# Patient Record
Sex: Male | Born: 1940 | Race: White | Hispanic: No | Marital: Married | Smoking: Former smoker
Health system: Southern US, Community
[De-identification: ages and names within clinical notes are randomized; demographics above are authoritative.]

## PROBLEM LIST (undated history)

## (undated) DIAGNOSIS — M069 Rheumatoid arthritis, unspecified: Secondary | ICD-10-CM

## (undated) DIAGNOSIS — J449 Chronic obstructive pulmonary disease, unspecified: Secondary | ICD-10-CM

## (undated) DIAGNOSIS — G5603 Carpal tunnel syndrome, bilateral upper limbs: Secondary | ICD-10-CM

## (undated) DIAGNOSIS — J45909 Unspecified asthma, uncomplicated: Secondary | ICD-10-CM

## (undated) DIAGNOSIS — E785 Hyperlipidemia, unspecified: Secondary | ICD-10-CM

## (undated) DIAGNOSIS — T8859XA Other complications of anesthesia, initial encounter: Secondary | ICD-10-CM

## (undated) DIAGNOSIS — N400 Enlarged prostate without lower urinary tract symptoms: Secondary | ICD-10-CM

## (undated) DIAGNOSIS — K219 Gastro-esophageal reflux disease without esophagitis: Secondary | ICD-10-CM

## (undated) DIAGNOSIS — I10 Essential (primary) hypertension: Secondary | ICD-10-CM

## (undated) HISTORY — PX: VASECTOMY: SHX75

## (undated) HISTORY — DX: Hyperlipidemia, unspecified: E78.5

## (undated) HISTORY — DX: Rheumatoid arthritis, unspecified: M06.9

## (undated) HISTORY — DX: Essential (primary) hypertension: I10

## (undated) HISTORY — DX: Benign prostatic hyperplasia without lower urinary tract symptoms: N40.0

## (undated) HISTORY — PX: HERNIA REPAIR: SHX51

## (undated) HISTORY — PX: UPPER GASTROINTESTINAL ENDOSCOPY: SHX188

## (undated) HISTORY — DX: Gastro-esophageal reflux disease without esophagitis: K21.9

## (undated) HISTORY — DX: Unspecified asthma, uncomplicated: J45.909

## (undated) HISTORY — DX: Carpal tunnel syndrome, bilateral upper limbs: G56.03

---

## 1998-05-09 ENCOUNTER — Encounter: Admission: RE | Admit: 1998-05-09 | Discharge: 1998-05-09 | Payer: Self-pay | Admitting: *Deleted

## 1998-10-20 ENCOUNTER — Ambulatory Visit (HOSPITAL_COMMUNITY): Admission: RE | Admit: 1998-10-20 | Discharge: 1998-10-20 | Payer: Self-pay | Admitting: Nurse Practitioner

## 2002-04-05 ENCOUNTER — Encounter: Payer: Self-pay | Admitting: Internal Medicine

## 2002-04-05 DIAGNOSIS — K648 Other hemorrhoids: Secondary | ICD-10-CM | POA: Insufficient documentation

## 2002-04-05 DIAGNOSIS — D126 Benign neoplasm of colon, unspecified: Secondary | ICD-10-CM

## 2004-07-03 ENCOUNTER — Ambulatory Visit: Payer: Self-pay | Admitting: Internal Medicine

## 2004-07-19 ENCOUNTER — Ambulatory Visit: Payer: Self-pay | Admitting: Internal Medicine

## 2004-07-27 ENCOUNTER — Ambulatory Visit: Payer: Self-pay | Admitting: Internal Medicine

## 2004-08-01 ENCOUNTER — Ambulatory Visit: Payer: Self-pay | Admitting: Internal Medicine

## 2004-08-01 ENCOUNTER — Encounter: Admission: RE | Admit: 2004-08-01 | Discharge: 2004-08-01 | Payer: Self-pay | Admitting: Internal Medicine

## 2004-08-07 ENCOUNTER — Ambulatory Visit: Payer: Self-pay | Admitting: Internal Medicine

## 2005-03-15 ENCOUNTER — Ambulatory Visit: Payer: Self-pay | Admitting: Family Medicine

## 2005-04-04 ENCOUNTER — Ambulatory Visit: Payer: Self-pay | Admitting: Internal Medicine

## 2005-05-31 ENCOUNTER — Ambulatory Visit: Payer: Self-pay | Admitting: Internal Medicine

## 2005-09-13 ENCOUNTER — Ambulatory Visit: Payer: Self-pay | Admitting: Internal Medicine

## 2006-10-01 ENCOUNTER — Ambulatory Visit: Payer: Self-pay | Admitting: Internal Medicine

## 2006-10-30 ENCOUNTER — Ambulatory Visit: Payer: Self-pay | Admitting: Internal Medicine

## 2006-10-30 LAB — CONVERTED CEMR LAB
ALT: 15 units/L (ref 0–40)
AST: 21 units/L (ref 0–37)
BUN: 11 mg/dL (ref 6–23)
Potassium: 3.7 meq/L (ref 3.5–5.1)
Total CHOL/HDL Ratio: 6.2
Triglycerides: 150 mg/dL — ABNORMAL HIGH (ref 0–149)

## 2007-01-02 ENCOUNTER — Telehealth (INDEPENDENT_AMBULATORY_CARE_PROVIDER_SITE_OTHER): Payer: Self-pay | Admitting: *Deleted

## 2007-01-13 ENCOUNTER — Ambulatory Visit: Payer: Self-pay | Admitting: Internal Medicine

## 2007-01-13 LAB — CONVERTED CEMR LAB
HDL goal, serum: 40 mg/dL
LDL Goal: 130 mg/dL

## 2007-01-30 ENCOUNTER — Ambulatory Visit: Payer: Self-pay | Admitting: Internal Medicine

## 2007-03-20 ENCOUNTER — Ambulatory Visit: Payer: Self-pay | Admitting: Internal Medicine

## 2007-03-21 LAB — CONVERTED CEMR LAB
Amylase: 59 units/L (ref 0–105)
Basophils Absolute: 0 10*3/uL (ref 0.0–0.1)
Basophils Relative: 0 % (ref 0–1)
Eosinophils Absolute: 0.2 10*3/uL (ref 0.0–0.7)
Eosinophils Relative: 4 % (ref 0–5)
HCT: 41.4 % (ref 39.0–52.0)
Hemoglobin: 13.7 g/dL (ref 13.0–17.0)
Lipase: <10 units/L (ref 0–75)
Lymphocytes Relative: 20 % (ref 12–46)
Lymphs Abs: 1.1 10*3/uL (ref 0.7–3.3)
MCHC: 33.1 g/dL (ref 30.0–36.0)
MCV: 84.3 fL (ref 78.0–100.0)
Monocytes Absolute: 0.7 10*3/uL (ref 0.2–0.7)
Monocytes Relative: 13 % — ABNORMAL HIGH (ref 3–11)
Neutro Abs: 3.6 10*3/uL (ref 1.7–7.7)
Neutrophils Relative %: 64 % (ref 43–77)
Platelets: 281 10*3/uL (ref 150–400)
RBC: 4.91 M/uL (ref 4.22–5.81)
RDW: 13.9 % (ref 11.5–14.0)
WBC: 5.6 10*3/uL (ref 4.0–10.5)

## 2007-03-23 ENCOUNTER — Encounter (INDEPENDENT_AMBULATORY_CARE_PROVIDER_SITE_OTHER): Payer: Self-pay | Admitting: *Deleted

## 2007-03-25 ENCOUNTER — Encounter: Payer: Self-pay | Admitting: Internal Medicine

## 2007-03-25 ENCOUNTER — Ambulatory Visit: Payer: Self-pay | Admitting: Internal Medicine

## 2007-03-25 DIAGNOSIS — K222 Esophageal obstruction: Secondary | ICD-10-CM | POA: Insufficient documentation

## 2007-03-30 ENCOUNTER — Encounter: Payer: Self-pay | Admitting: Internal Medicine

## 2007-04-22 ENCOUNTER — Telehealth (INDEPENDENT_AMBULATORY_CARE_PROVIDER_SITE_OTHER): Payer: Self-pay | Admitting: *Deleted

## 2007-06-17 ENCOUNTER — Ambulatory Visit: Payer: Self-pay | Admitting: Internal Medicine

## 2007-06-24 ENCOUNTER — Ambulatory Visit: Payer: Self-pay | Admitting: Internal Medicine

## 2007-06-24 ENCOUNTER — Encounter: Payer: Self-pay | Admitting: Internal Medicine

## 2007-07-07 DIAGNOSIS — Z87898 Personal history of other specified conditions: Secondary | ICD-10-CM

## 2007-07-07 DIAGNOSIS — I10 Essential (primary) hypertension: Secondary | ICD-10-CM | POA: Insufficient documentation

## 2007-07-07 DIAGNOSIS — K219 Gastro-esophageal reflux disease without esophagitis: Secondary | ICD-10-CM | POA: Insufficient documentation

## 2007-07-09 HISTORY — PX: COLONOSCOPY W/ POLYPECTOMY: SHX1380

## 2008-01-21 ENCOUNTER — Telehealth (INDEPENDENT_AMBULATORY_CARE_PROVIDER_SITE_OTHER): Payer: Self-pay | Admitting: *Deleted

## 2008-01-27 ENCOUNTER — Telehealth (INDEPENDENT_AMBULATORY_CARE_PROVIDER_SITE_OTHER): Payer: Self-pay | Admitting: *Deleted

## 2008-01-29 ENCOUNTER — Telehealth (INDEPENDENT_AMBULATORY_CARE_PROVIDER_SITE_OTHER): Payer: Self-pay | Admitting: *Deleted

## 2008-03-17 ENCOUNTER — Ambulatory Visit: Payer: Self-pay | Admitting: Internal Medicine

## 2008-03-17 DIAGNOSIS — Z860100 Personal history of colon polyps, unspecified: Secondary | ICD-10-CM | POA: Insufficient documentation

## 2008-03-17 DIAGNOSIS — E785 Hyperlipidemia, unspecified: Secondary | ICD-10-CM | POA: Insufficient documentation

## 2008-03-17 DIAGNOSIS — N400 Enlarged prostate without lower urinary tract symptoms: Secondary | ICD-10-CM

## 2008-03-17 DIAGNOSIS — J301 Allergic rhinitis due to pollen: Secondary | ICD-10-CM

## 2008-03-21 ENCOUNTER — Ambulatory Visit: Payer: Self-pay | Admitting: Internal Medicine

## 2008-03-23 ENCOUNTER — Encounter (INDEPENDENT_AMBULATORY_CARE_PROVIDER_SITE_OTHER): Payer: Self-pay | Admitting: *Deleted

## 2008-03-23 LAB — CONVERTED CEMR LAB
Albumin: 4.1 g/dL (ref 3.5–5.2)
Alkaline Phosphatase: 58 units/L (ref 39–117)
BUN: 9 mg/dL (ref 6–23)
Basophils Absolute: 0 10*3/uL (ref 0.0–0.1)
Basophils Relative: 0.6 % (ref 0.0–3.0)
Eosinophils Absolute: 0.2 10*3/uL (ref 0.0–0.7)
Eosinophils Relative: 3.7 % (ref 0.0–5.0)
HCT: 36 % — ABNORMAL LOW (ref 39.0–52.0)
Hemoglobin: 12.2 g/dL — ABNORMAL LOW (ref 13.0–17.0)
Hgb A1c MFr Bld: 6 % (ref 4.6–6.0)
MCHC: 34 g/dL (ref 30.0–36.0)
MCV: 79.4 fL (ref 78.0–100.0)
Neutro Abs: 3.6 10*3/uL (ref 1.4–7.7)
Neutrophils Relative %: 61.1 % (ref 43.0–77.0)
RBC: 4.53 M/uL (ref 4.22–5.81)
Total Protein: 6.8 g/dL (ref 6.0–8.3)
WBC: 5.7 10*3/uL (ref 4.5–10.5)

## 2008-05-24 ENCOUNTER — Ambulatory Visit: Payer: Self-pay | Admitting: Internal Medicine

## 2008-05-31 ENCOUNTER — Telehealth (INDEPENDENT_AMBULATORY_CARE_PROVIDER_SITE_OTHER): Payer: Self-pay | Admitting: *Deleted

## 2008-06-16 ENCOUNTER — Ambulatory Visit: Payer: Self-pay | Admitting: Internal Medicine

## 2008-06-17 ENCOUNTER — Telehealth: Payer: Self-pay | Admitting: Internal Medicine

## 2008-06-20 ENCOUNTER — Encounter: Payer: Self-pay | Admitting: Internal Medicine

## 2008-06-23 ENCOUNTER — Ambulatory Visit (HOSPITAL_COMMUNITY): Admission: RE | Admit: 2008-06-23 | Discharge: 2008-06-23 | Payer: Self-pay | Admitting: Orthopedic Surgery

## 2008-07-08 HISTORY — PX: LUMBAR LAMINECTOMY: SHX95

## 2008-07-12 ENCOUNTER — Encounter (INDEPENDENT_AMBULATORY_CARE_PROVIDER_SITE_OTHER): Payer: Self-pay | Admitting: Orthopedic Surgery

## 2008-07-12 ENCOUNTER — Ambulatory Visit (HOSPITAL_COMMUNITY): Admission: RE | Admit: 2008-07-12 | Discharge: 2008-07-14 | Payer: Self-pay | Admitting: Orthopedic Surgery

## 2009-05-04 ENCOUNTER — Ambulatory Visit: Payer: Self-pay | Admitting: Internal Medicine

## 2009-05-04 DIAGNOSIS — R7309 Other abnormal glucose: Secondary | ICD-10-CM

## 2009-05-08 ENCOUNTER — Ambulatory Visit: Payer: Self-pay | Admitting: Internal Medicine

## 2009-05-12 ENCOUNTER — Telehealth (INDEPENDENT_AMBULATORY_CARE_PROVIDER_SITE_OTHER): Payer: Self-pay | Admitting: *Deleted

## 2009-05-12 ENCOUNTER — Encounter (INDEPENDENT_AMBULATORY_CARE_PROVIDER_SITE_OTHER): Payer: Self-pay | Admitting: *Deleted

## 2009-05-12 ENCOUNTER — Ambulatory Visit: Payer: Self-pay | Admitting: Internal Medicine

## 2009-05-12 LAB — CONVERTED CEMR LAB
AST: 21 units/L (ref 0–37)
Alkaline Phosphatase: 53 units/L (ref 39–117)
Basophils Relative: 1.1 % (ref 0.0–3.0)
Bilirubin, Direct: 0 mg/dL (ref 0.0–0.3)
Creatinine, Ser: 1.3 mg/dL (ref 0.4–1.5)
Eosinophils Absolute: 0.2 10*3/uL (ref 0.0–0.7)
Hemoglobin: 9.2 g/dL — ABNORMAL LOW (ref 13.0–17.0)
Lymphs Abs: 0.9 10*3/uL (ref 0.7–4.0)
MCHC: 32.1 g/dL (ref 30.0–36.0)
MCV: 69.9 fL — ABNORMAL LOW (ref 78.0–100.0)
Monocytes Absolute: 0.5 10*3/uL (ref 0.1–1.0)
Neutro Abs: 2.6 10*3/uL (ref 1.4–7.7)
Potassium: 3.9 meq/L (ref 3.5–5.1)
RBC: 4.12 M/uL — ABNORMAL LOW (ref 4.22–5.81)
RDW: 15.8 % — ABNORMAL HIGH (ref 11.5–14.6)
Total Protein: 6.7 g/dL (ref 6.0–8.3)

## 2009-05-15 ENCOUNTER — Telehealth (INDEPENDENT_AMBULATORY_CARE_PROVIDER_SITE_OTHER): Payer: Self-pay | Admitting: *Deleted

## 2009-05-15 LAB — CONVERTED CEMR LAB
Eosinophils Absolute: 0.2 10*3/uL (ref 0.0–0.7)
Eosinophils Relative: 3 % (ref 0–5)
HCT: 28.7 % — ABNORMAL LOW (ref 39.0–52.0)
Hemoglobin: 8.5 g/dL — ABNORMAL LOW (ref 13.0–17.0)
Lymphs Abs: 1.2 10*3/uL (ref 0.7–4.0)
MCV: 70.9 fL — ABNORMAL LOW (ref 78.0–100.0)
Monocytes Absolute: 0.7 10*3/uL (ref 0.1–1.0)
Monocytes Relative: 11 % (ref 3–12)
Neutrophils Relative %: 66 % (ref 43–77)
RBC: 4.05 M/uL — ABNORMAL LOW (ref 4.22–5.81)
WBC: 6.5 10*3/uL (ref 4.0–10.5)

## 2009-05-16 ENCOUNTER — Ambulatory Visit: Payer: Self-pay | Admitting: Internal Medicine

## 2009-05-16 ENCOUNTER — Encounter (INDEPENDENT_AMBULATORY_CARE_PROVIDER_SITE_OTHER): Payer: Self-pay | Admitting: *Deleted

## 2009-05-16 DIAGNOSIS — D519 Vitamin B12 deficiency anemia, unspecified: Secondary | ICD-10-CM | POA: Insufficient documentation

## 2009-05-23 ENCOUNTER — Encounter (INDEPENDENT_AMBULATORY_CARE_PROVIDER_SITE_OTHER): Payer: Self-pay | Admitting: *Deleted

## 2009-05-23 ENCOUNTER — Ambulatory Visit: Payer: Self-pay | Admitting: Internal Medicine

## 2009-05-23 LAB — CONVERTED CEMR LAB
OCCULT 1: NEGATIVE
OCCULT 2: NEGATIVE
OCCULT 3: NEGATIVE

## 2009-06-16 ENCOUNTER — Ambulatory Visit: Payer: Self-pay | Admitting: Family Medicine

## 2009-06-19 ENCOUNTER — Ambulatory Visit: Payer: Self-pay | Admitting: Internal Medicine

## 2009-06-19 DIAGNOSIS — R142 Eructation: Secondary | ICD-10-CM

## 2009-06-19 DIAGNOSIS — R143 Flatulence: Secondary | ICD-10-CM

## 2009-06-19 DIAGNOSIS — R141 Gas pain: Secondary | ICD-10-CM | POA: Insufficient documentation

## 2009-06-19 DIAGNOSIS — D509 Iron deficiency anemia, unspecified: Secondary | ICD-10-CM | POA: Insufficient documentation

## 2009-06-23 ENCOUNTER — Ambulatory Visit: Payer: Self-pay | Admitting: Internal Medicine

## 2009-06-23 ENCOUNTER — Ambulatory Visit: Payer: Self-pay | Admitting: Family

## 2009-06-23 ENCOUNTER — Telehealth: Payer: Self-pay | Admitting: Family

## 2009-06-23 DIAGNOSIS — J019 Acute sinusitis, unspecified: Secondary | ICD-10-CM

## 2009-06-23 DIAGNOSIS — J45909 Unspecified asthma, uncomplicated: Secondary | ICD-10-CM | POA: Insufficient documentation

## 2009-06-29 ENCOUNTER — Ambulatory Visit: Payer: Self-pay | Admitting: Family

## 2009-07-12 ENCOUNTER — Ambulatory Visit: Payer: Self-pay | Admitting: Internal Medicine

## 2009-07-14 ENCOUNTER — Encounter: Payer: Self-pay | Admitting: Internal Medicine

## 2009-08-02 ENCOUNTER — Ambulatory Visit: Payer: Self-pay | Admitting: Internal Medicine

## 2009-08-02 DIAGNOSIS — D1330 Benign neoplasm of unspecified part of small intestine: Secondary | ICD-10-CM | POA: Insufficient documentation

## 2009-08-02 LAB — CONVERTED CEMR LAB
Eosinophils Absolute: 0.1 10*3/uL (ref 0.0–0.7)
Eosinophils Relative: 1.3 % (ref 0.0–5.0)
MCV: 71.2 fL — ABNORMAL LOW (ref 78.0–100.0)
Monocytes Absolute: 0.6 10*3/uL (ref 0.1–1.0)
Neutrophils Relative %: 71.6 % (ref 43.0–77.0)
Platelets: 443 10*3/uL — ABNORMAL HIGH (ref 150.0–400.0)
WBC: 6.7 10*3/uL (ref 4.5–10.5)

## 2009-08-10 ENCOUNTER — Ambulatory Visit: Payer: Self-pay | Admitting: Internal Medicine

## 2009-08-30 ENCOUNTER — Telehealth: Payer: Self-pay | Admitting: Internal Medicine

## 2009-09-01 ENCOUNTER — Telehealth (INDEPENDENT_AMBULATORY_CARE_PROVIDER_SITE_OTHER): Payer: Self-pay | Admitting: *Deleted

## 2009-09-01 ENCOUNTER — Ambulatory Visit: Payer: Self-pay | Admitting: Internal Medicine

## 2009-09-01 ENCOUNTER — Ambulatory Visit (HOSPITAL_COMMUNITY): Admission: RE | Admit: 2009-09-01 | Discharge: 2009-09-01 | Payer: Self-pay | Admitting: Internal Medicine

## 2009-09-13 ENCOUNTER — Encounter: Payer: Self-pay | Admitting: Internal Medicine

## 2009-09-21 ENCOUNTER — Telehealth: Payer: Self-pay | Admitting: Internal Medicine

## 2009-10-24 ENCOUNTER — Encounter: Payer: Self-pay | Admitting: Internal Medicine

## 2009-10-26 ENCOUNTER — Telehealth: Payer: Self-pay | Admitting: Internal Medicine

## 2009-11-02 ENCOUNTER — Ambulatory Visit: Payer: Self-pay | Admitting: Family Medicine

## 2009-11-02 ENCOUNTER — Encounter: Payer: Self-pay | Admitting: Internal Medicine

## 2009-11-07 ENCOUNTER — Encounter: Payer: Self-pay | Admitting: Internal Medicine

## 2009-12-11 ENCOUNTER — Ambulatory Visit: Payer: Self-pay | Admitting: Internal Medicine

## 2010-01-19 ENCOUNTER — Encounter: Payer: Self-pay | Admitting: Internal Medicine

## 2010-02-06 ENCOUNTER — Encounter: Payer: Self-pay | Admitting: Internal Medicine

## 2010-02-07 ENCOUNTER — Encounter: Payer: Self-pay | Admitting: Internal Medicine

## 2010-06-06 ENCOUNTER — Ambulatory Visit: Payer: Self-pay | Admitting: Internal Medicine

## 2010-06-08 ENCOUNTER — Telehealth: Payer: Self-pay | Admitting: Internal Medicine

## 2010-06-18 ENCOUNTER — Ambulatory Visit: Payer: Self-pay | Admitting: Internal Medicine

## 2010-07-08 HISTORY — PX: WHIPPLE PROCEDURE: SHX2667

## 2010-08-07 NOTE — Procedures (Signed)
Summary: [facility]  [facility]   Imported By: Lanelle Bal 09/25/2009 14:02:07  _____________________________________________________________________  External Attachment:    Type:   Image     Comment:   External Document

## 2010-08-07 NOTE — Progress Notes (Signed)
   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 They will fax forms to be completed and when sent back they will be reviewd and appt. scheduled. Initial call taken by: Teryl Lucy RN,  September 01, 2009 2:54 PM  Follow-up for Phone Call        Forms completed and faxed to [facility] office.Pt. aware. Follow-up by: Teryl Lucy RN,  September 04, 2009 9:17 AM

## 2010-08-07 NOTE — Letter (Signed)
Summary: [facility]  [facility]   Imported By: Lanelle Bal 11/15/2009 12:47:44  _____________________________________________________________________  External Attachment:    Type:   Image     Comment:   External Document

## 2010-08-07 NOTE — Letter (Signed)
Summary: [facility]  [facility]   Imported By: Lanelle Bal 02/20/2010 08:39:42  _____________________________________________________________________  External Attachment:    Type:   Image     Comment:   External Document

## 2010-08-07 NOTE — Letter (Signed)
Summary: Patient [facility] Biopsy Results  [facility]  [address] [place], [place] [address]   Phone: [phone]  Fax: [phone]        July 14, 2009 MRN: [id number]    Thomas Hudson [address] [place], [place]  [address]    Dear [name],  I am pleased to inform you that the biopsies taken during your recent endoscopic examination did not show any evidence of cancer upon pathologic examination.The biopsy of the prominent area looks to be a benign precancerous polyp. We may need to remove this for you in the near future.  The other biopsies did not show celiac sprue (good).  Additional information/recommendations:    __ Please call [phone] to schedule a return visit to review      your condition.  __ Continue with the treatment plan as outlined on the day of your      exam.  __ You should have a repeat endoscopic examination for this problem              in a time to be determined .   Please call us if you are having persistent problems or have questions about your condition that have not been fully answered at this time.  Sincerely,  Hilarie Fredrickson MD  This letter has been electronically signed by your physician.  Appended Document: Patient Notice-Endo Biopsy Results Letter mailed 01.13.11

## 2010-08-07 NOTE — Progress Notes (Signed)
Summary: Reschedule procedure   Phone Note Call from Patient Call back at Home Phone [phone]   Caller: [name] Call For: Dr. Marina Goodell Reason for Call: Talk to Nurse Summary of Call: Pt had a EGD at [facility] this morning and thought it was scheduled for next week. Needs to r/s appt. Initial call taken by: Karna Christmas,  August 30, 2009 9:01 AM  Follow-up for Phone Call        When should I r/s pt.? Follow-up by: Teryl Lucy RN,  August 30, 2009 9:07 AM  Additional Follow-up for Phone Call Additional follow up Details #1::        Either this Friday morning or my hospital week in March. EGD w/ ERCP scope (no C-arm needed) Additional Follow-up by: Hilarie Fredrickson MD,  August 30, 2009 9:10 AM    Additional Follow-up for Phone Call Additional follow up Details #2::    Pt.s procedure r/s to this Friday at 9a.m. Clydie Braun made aware that ERCP scope is needed and no C-arm needed.. Booking # now [id number]. Follow-up by: Teryl Lucy RN,  August 30, 2009 9:26 AM

## 2010-08-07 NOTE — Procedures (Signed)
Summary: Upper Endoscopy  Patient: Thomas Hudson Note: All result statuses are Final unless otherwise noted.  Tests: (1) Upper Endoscopy (EGD)   EGD Upper Endoscopy       DONE     [facility]     [address] [place], [place]  [address]           ENDOSCOPY PROCEDURE REPORT           PATIENT:  Thomas Hudson, [name]  MR#:  [id number]     BIRTHDATE:  [birth year], 68 yrs. old  GENDER:  male           ENDOSCOPIST:  Wilhemina Bonito. Eda Keys, MD     Referred by:  Office           PROCEDURE DATE:  09/01/2009     PROCEDURE:  1.Push enteroscopy w/ pediatric colonoscope     2. 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 another lesion in same region on capsule endoscopy imaging           MEDICATIONS:   Fentanyl 100 mcg, Versed 8.5 mg     TOPICAL ANESTHETIC:  Cetacaine Spray           DESCRIPTION OF PROCEDURE:   After the risks benefits and     alternatives of the procedure were thoroughly explained, informed     consent was obtained.  The  endoscopes were introduced through the     mouth and advanced to the proximal jejunum, without limitations.     Push enteroscopy was performed initially, followed by     duodenoscopy. The instrument was slowly withdrawn as the mucosa     was fully examined.     <<PROCEDUREIMAGES>>           The esophagus was normal. The proximal stomach was normal. Mild     gastritis (linears erythema and mild erosive change) was found in     the antrum. The bulb was normal. The previously biopsy confirmed     villous mass was found to involve the major papilla. It was about     1.5-2cm in size. The remainder of the duodenum and jejunum (about     2 feet past the ligament of Trietz) was normal except for one tiny     AVM in the proximal jejunum.  Otherwise the examination was     normal.    Retroflexed views revealed no abnormalities.    The     scope was then withdrawn from the patient and the  procedure     completed.           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           ______________________________     Wilhemina Bonito. Marina Goodell  Montez Hageman, MD           CC:  Pecola Lawless, MD, DR. Danny Lawless [facility]);The Patient           n.     eSIGNED:   John N. Eda Keys at 09/01/2009 10:51 AM           Thomas Hudson, [id number]  Note: An exclamation mark (!) indicates a result that was not dispersed into the flowsheet. Document Creation Date: 09/01/2009 10:52 AM _______________________________________________________________________  (1) Order result status: Final Collection or observation date-time: 09/01/2009 10:24 Requested date-time:  Receipt date-time:  Reported date-time:  Referring Physician:   Ordering Physician: Fransico Setters [id number]) Specimen Source:  Source: Launa Grill Order Number: [id number] Lab site:

## 2010-08-07 NOTE — Progress Notes (Signed)
Summary: Questions about referral to [facility]   Phone Note Call from Patient Call back at [facility] Phone [phone]   Call For: Dr Marina Goodell Reason for Call: Talk to Doctor Summary of Call: Was sent to [facility] for a growth. Has some questions. Initial call taken by: Leanor K[facility],  September 21, 2009 9:08 AM  Follow-up for Phone Call         Pt. had procedure(EUS) on 09/13/2009 by Dr. Logan Bores at [facility] and wife said   they were told pt. will need surgery and it will be very serious so she would like Dr.Emmilynn Marut to review the records (which I requested from their medical records dept) and give his recommendations and is also wondering if they need 2nd. opinion or what is his advice on surgery there. Follow-up by: Teryl Lucy RN,  September 21, 2009 10:16 AM  Additional Follow-up for Phone Call Additional follow up Details #1::        I discussed with Mrs Deven to her satisfaction. They are anticipating surgical consult at [facility] Additional Follow-up by: Hilarie Fredrickson MD,  September 22, 2009 12:22 PM

## 2010-08-07 NOTE — Assessment & Plan Note (Signed)
Summary: congested/cbs   Vital Signs:  Patient profile:   70 year old male Weight:      167.6 pounds Temp:     98.2 degrees F oral Pulse rate:   80 / minute Resp:     16 per minute BP sitting:   124 / 80  (left arm) Cuff size:   large  Vitals Entered By: Shonna Chock (December 11, 2009 12:33 PM) CC: Cough and congestion (discolored-brown) and Fever (now gone) Comments REVIEWED MED LIST, PATIENT AGREED DOSE AND INSTRUCTION CORRECT    Primary Care Provider:  Marga Melnick, MD  CC:  Cough and congestion (discolored-brown) and Fever (now gone).  History of Present Illness: Onset 12/05/2009 as sneezing & watery eyes followed by  head  congestion with PNDrainage &  productive  cough. Whipple procedure scheduled @ [facility] for today was cancelled. Rx: Mucinex D , RobitussinDM  Allergies (verified): No Known Drug Allergies  Review of Systems General:  Denies chills, fever, and sweats;  ASA taken for low grade fever 06/05 with resolution. ENT:  Complains of nasal congestion and sinus pressure; denies ear discharge and earache; Facial & dental pain  with purulence which is getting lighter. Resp:  Complains of shortness of breath and wheezing; denies chest pain with inspiration and coughing up blood; No PMH of asthma. Allergy:  Denies itching eyes and sneezing; No extrinsic symptoms now.  Physical Exam  General:  in no acute distress; alert,appropriate and cooperative throughout examination Ears:  External ear exam shows no significant lesions or deformities.  Otoscopic examination reveals clear canals, tympanic membranes are intact bilaterally without bulging, retraction, inflammation or discharge. Hearing is grossly normal bilaterally. Nose:  External nasal examination shows no deformity or inflammation. Nasal mucosa are mildly erythematous without lesions or exudates. Mouth:  Oral mucosa and oropharynx without lesions or exudates.  Teeth in good repair. Lungs:  Normal respiratory effort,  chest expands symmetrically. Lungs : mild rales w/o increased WOB Heart:  Normal rate and regular rhythm. S1 and S2 normal without gallop, murmur, click, rub. Cervical Nodes:  No lymphadenopathy noted Axillary Nodes:  No palpable lymphadenopathy   Impression & Recommendations:  Problem # 1:  SINUSITIS, ACUTE (ICD-461.9)  The following medications were removed from the medication list:    Cheratussin Ac 100-10 Mg/2ml Syrp (Guaifenesin-codeine) .Marland Kitchen... 1-2 tsp by mouth at bedtime as needed cough His updated medication list for this problem includes:    Amoxicillin-pot Clavulanate 875-125 Mg Tabs (Amoxicillin-pot clavulanate) .Marland Kitchen... 1 every 12 hrs witth a meal    Promethazine Vc/codeine 6.25-5-10 Mg/50ml Syrp (Phenyleph-promethazine-cod) .Marland Kitchen... 1 tsp every 6 hrs as needed  Problem # 2:  BRONCHITIS-ACUTE (ICD-466.0)  The following medications were removed from the medication list:    Proair Hfa 108 (90 Base) Mcg/act Aers (Albuterol sulfate) .Marland Kitchen... 2 puffs qid as needed    Cheratussin Ac 100-10 Mg/85ml Syrp (Guaifenesin-codeine) .Marland Kitchen... 1-2 tsp by mouth at bedtime as needed cough His updated medication list for this problem includes:    Amoxicillin-pot Clavulanate 875-125 Mg Tabs (Amoxicillin-pot clavulanate) .Marland Kitchen... 1 every 12 hrs witth a meal    Promethazine Vc/codeine 6.25-5-10 Mg/53ml Syrp (Phenyleph-promethazine-cod) .Marland Kitchen... 1 tsp every 6 hrs as needed  Complete Medication List: 1)  Proscar 5 Mg Tabs (Finasteride) .Marland Kitchen.. 1 by mouth qd 2)  Zyrtec Allergy 10 Mg Tabs (Cetirizine hcl) .Marland Kitchen.. 1 by mouth once daily 3)  Prilosec 20 Mg Cpdr (Omeprazole) .Marland Kitchen.. 1 by mouth once daily 4)  Zestoretic 20-12.5 Mg Tabs (Lisinopril-hydrochlorothiazide) .Marland KitchenMarland KitchenMarland Kitchen 1  once daily 5)  Pravastatin Sodium 40 Mg Tabs (Pravastatin sodium) .Marland Kitchen.. 1 at bedtime 6)  Ferrous Sulfate 325 (65 Fe) Mg Tabs (Ferrous sulfate) .... Take 1 tab by mouth three times a day. 7)  Amoxicillin-pot Clavulanate 875-125 Mg Tabs (Amoxicillin-pot clavulanate)  .Marland Kitchen.. 1 every 12 hrs witth a meal 8)  Promethazine Vc/codeine 6.25-5-10 Mg/60ml Syrp (Phenyleph-promethazine-cod) .Marland Kitchen.. 1 tsp every 6 hrs as needed  Patient Instructions: 1)  Drink as much fluid as you can tolerate for the next few days. Use sample of Advair 250/ 50 as one  inhalation every 12 hrs ; gargle & spit after use Prescriptions: PROMETHAZINE VC/CODEINE 6.25-5-10 MG/5ML SYRP (PHENYLEPH-PROMETHAZINE-COD) 1 tsp every 6 hrs as needed  #120cc x 0   Entered and Authorized by:   Marga Melnick MD   Signed by:   Marga Melnick MD on 12/11/2009   Method used:   Printed then faxed to ...       [facility]  [address] [id number] (retail)       [address]       [place], [place]  [address]       Ph: [phone]       Fax: [phone]   RxID:   [id number] AMOXICILLIN-POT CLAVULANATE 875-125 MG TABS (AMOXICILLIN-POT CLAVULANATE) 1 every 12 hrs witth a meal  #20 x 0   Entered and Authorized by:   Marga Melnick MD   Signed by:   Marga Melnick MD on 12/11/2009   Method used:   Printed then faxed to ...       [facility]  [address] [id number] ([facility]ail)       [address]       [place], [place]  [address]       Ph: [phone]       Fax: [phone]   RxID:   [id number]

## 2010-08-07 NOTE — Assessment & Plan Note (Signed)
Summary: flu shot//lch   Nurse Visit  CC: Flu shot./kb   Allergies: No Known Drug Allergies  Orders Added: 1)  Flu Vaccine 67yrs + MEDICARE PATIENTS [Q2039] 2)  Administration Flu vaccine - MCR [G0008] Prescriptions: PRILOSEC 20 MG CPDR (OMEPRAZOLE) 1 by mouth once daily  #90 x 2   Entered by:   Lucious Groves CMA   Authorized by:   Marga Melnick MD   Signed by:   Lucious Groves CMA on 06/06/2010   Method used:   Print then Give to Patient   RxID:   [id number]      Flu Vaccine Consent Questions     Do you have a history of severe allergic reactions to this vaccine? no    Any prior history of allergic reactions to egg and/or gelatin? no    Do you have a sensitivity to the preservative Thimersol? no    Do you have a past history of Guillan-Barre Syndrome? no    Do you currently have an acute febrile illness? no    Have you ever had a severe reaction to latex? no    Vaccine information given and explained to patient? yes    Are you currently pregnant? no    Lot Number:[id number]   Exp Date:01/05/2011   Site Given  Left Deltoid IMu

## 2010-08-07 NOTE — Miscellaneous (Signed)
Summary: Capsule Endoscopy Consent & Scheduling forms/[facility]  Capsule Endoscopy Consent & Scheduling forms/[facility]   Imported By: Sherian Rein 08/18/2009 16:01:53  _____________________________________________________________________  External Attachment:    Type:   Image     Comment:   External Document

## 2010-08-07 NOTE — Procedures (Signed)
Summary: Upper Endoscopy  Patient: Thomas Hudson Note: All result statuses are Final unless otherwise noted.  Tests: (1) Upper Endoscopy (EGD)   EGD Upper Endoscopy       DONE     [facility]     [address]     [place], [place]  [address]           ENDOSCOPY PROCEDURE REPORT           PATIENT:  [name], [name]  MR#:  [id number]     BIRTHDATE:  1941/05/28, 68 yrs. old  GENDER:  male           ENDOSCOPIST:  Wilhemina Bonito. Eda Keys, MD     Referred by:  Office           PROCEDURE DATE:  07/12/2009     PROCEDURE:  EGD with biopsy     ASA CLASS:  Class II     INDICATIONS:  iron deficiency anemia           MEDICATIONS:   There was residual sedation effect present from     prior procedure., Fentanyl 25 mcg IV     TOPICAL ANESTHETIC:  Exactacain Spray           DESCRIPTION OF PROCEDURE:   After the risks benefits and     alternatives of the procedure were thoroughly explained, informed     consent was obtained.  The LB GIF-H180 [id number] endoscope was     introduced through the mouth and advanced to the third portion of     the duodenum, without limitations.  The instrument was slowly     withdrawn as the mucosa was fully examined.     <<PROCEDUREIMAGES>>           The upper, middle, and distal third of the esophagus were     carefully inspected and no abnormalities were noted. The z-line     was well seen at the GEJ. The endoscope was pushed into the fundus     which was normal including a retroflexed view. The antrum,gastric     body, first and second part of the duodenum were unremarkable. The     was a prominant villous structure in the region of the ampulla (?     normal vs adenoma) which was biopsied.   Retroflexed views     revealed no abnormalities. Also, bx of postbulbar duodenum taken     to r/o sprue.   The scope was then withdrawn from the patient and     the procedure completed.           COMPLICATIONS:  None           ENDOSCOPIC IMPRESSION:     1)  Normal EGD     2) ? large ampulla vs adenoma           RECOMMENDATIONS:     1) await biopsy results     2) FeSO4 325mg  tid; #90 ; 11 refills     3) CALL FOR OFFICE VISIT WITH DR Marina Goodell IN A FEW WEEKS [phone])           ______________________________     Wilhemina Bonito. Eda Keys, MD           CC:  Pecola Lawless, MD, The Patient           n.     eSIGNED:   Wilhemina Bonito. Eda Keys at 07/12/2009 03:41 PM  [place], [place], [id number]  Note: An exclamation mark (!) indicates a result that was not dispersed into the flowsheet. Document Creation Date: 07/12/2009 3:41 PM _______________________________________________________________________  (1) Order result status: Final Collection or observation date-time: 07/12/2009 15:30 Requested date-time:  Receipt date-time:  Reported date-time:  Referring Physician:   Ordering Physician: Fransico Setters [phone]) Specimen Source:  Source: Launa Grill Order Number: [id number] Lab site:

## 2010-08-07 NOTE — Procedures (Signed)
Summary: 280.9,209.01/BS  Patient here today for capsule endoscopy for Dr. Marina Goodell .  Pt verbalized understanding of all verbal and written instructions.  Pt tolerated well.  Lot # [id number]  exp date  2012-01 .

## 2010-08-07 NOTE — Letter (Signed)
Summary: [facility]   Imported By: Sherian Rein 01/24/2010 09:33:55  _____________________________________________________________________  External Attachment:    Type:   Image     Comment:   External Document

## 2010-08-07 NOTE — Letter (Signed)
Summary: EGD Instructions  [facility]  [address] [place], [place] [address]   Phone: [phone]  Fax: [phone]       Thomas Hudson    [birth year]    MRN: [id number]       Procedure Day /Date:WEDNESDAY, 08/30/09     Arrival Time: 7:30 AM     Procedure Time:8:30 AM     Location of Procedure:                     [facility] ( Outpatient Registration)    PREPARATION FOR ENDOSCOPY W/ERCP SCOPE   On [facility] 08/30/09 THE DAY OF THE PROCEDURE:  1.   No solid foods, milk or milk products are allowed after midnight the night before your procedure.  2.   Do not drink anything colored red or purple.  Avoid juices with pulp.  No orange juice.  3.  You may drink clear liquids until 4:30 AMwhich is 4 hours before your procedure.                                                                                                CLEAR LIQUIDS INCLUDE: Water Jello Ice Popsicles Tea (sugar ok, no milk/cream) Powdered fruit flavored drinks Coffee (sugar ok, no milk/cream) Gatorade Juice: apple, white grape, white cranberry  Lemonade Clear bullion, consomm, broth Carbonated beverages (any kind) Strained chicken noodle soup Hard Candy   MEDICATION INSTRUCTIONS  Unless otherwise instructed, you should take regular prescription medications with a small sip of water as early as possible the morning of your procedure.             OTHER INSTRUCTIONS  You will need a responsible adult at least 68 years of age to accompany you and drive you home.   This person must remain in the waiting room during your procedure.  Wear loose fitting clothing that is easily removed.  Leave jewelry and other valuables at home.  However, you may wish to bring a book to read or an iPod/MP3 player to listen to music as you wait for your procedure to start.  Remove all body piercing jewelry and leave at home.  Total time from sign-in until discharge is approximately 2-3  hours.  You should go home directly after your procedure and rest.  You can resume normal activities the day after your procedure.  The day of your procedure you should not:   Drive   Make legal decisions   Operate machinery   Drink alcohol   Return to work  You will receive specific instructions about eating, activities and medications before you leave.    The above instructions have been reviewed and explained to me by   _______________________    I fully understand and can verbalize these instructions _____________________________ Date _________

## 2010-08-07 NOTE — Procedures (Signed)
Summary[facility]   Imported By: Lester [place] 10/10/2009 10:55:25  _____________________________________________________________________  External Attachment:    Type:   Image     Comment:   External Document

## 2010-08-07 NOTE — Letter (Signed)
Summary: [facility]  [facility] Surgery   Imported By: Lanelle Bal 02/19/2010 12:05:42  _____________________________________________________________________  External Attachment:    Type:   Image     Comment:   External Document

## 2010-08-07 NOTE — Procedures (Signed)
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  Summary and Recommendations:  Plan is for enteroscopy with side viewing scope to determine best method for resection of adenoma.  This report was created from the original report, which was reviewed and signed by the above listed reading physician.

## 2010-08-07 NOTE — Progress Notes (Signed)
Summary: Triage ?'s   Phone Note Call from Patient Call back at Home Phone [phone]   Caller: spouse Johnny Bridge Call For: Dr. Marina Goodell Reason for Call: Talk to Nurse Summary of Call: Pt's wife is requesting to speak directly w/Dr. Marina Goodell about the referral to [facility]. Initial call taken by: Karna Christmas,  October 26, 2009 2:27 PM  Follow-up for Phone Call        Patient  was seen at [facility] by Dr Logan Bores he sent them on to see Dr Marilynn Rail.  He has recommended a  Whipple.  Patient and his wife would like to speak to Dr Marina Goodell directly they are aware that Dr Marina Goodell is out of the office and won't return until 4/27.  I advised Mrs Raymundo that I will forward this message on to Dr Lamar Sprinkles nurse to have him review when he returns. Follow-up by: Darcey Nora RN, CGRN,  October 26, 2009 2:37 PM  Additional Follow-up for Phone Call Additional follow up Details #1::        I have spoken to them directly about this on 2 prior occassions. What's the question regarding? Additional Follow-up by: Hilarie Fredrickson MD,  October 30, 2009 11:35 AM    Additional Follow-up for Phone Call Additional follow up Details #2::    Message left to call back   Teryl Lucy RN  October 30, 2009 12:45 PM Dr.Howerton feels pt. needs a Whipple done. Wife is asking for your opinion and asking if you are comfortable with him or do they need another opinion?. Follow-up by: Teryl Lucy RN,  October 30, 2009 4:04 PM  Additional Follow-up for Phone Call Additional follow up Details #3:: Details for Additional Follow-up Action Taken: I have told them previously that he is expert in this area and this type of surgery. I send him a number of my patients to Dr Marilynn Rail and I am comfortable with his opinion and skills. I feel that they are in very good hands. Additional Follow-up by: Hilarie Fredrickson MD,  October 30, 2009 5:07 PM  Pt.s wife ntfd. about Dr.Emidio Warrell's recommendation about Dr.Howerton.

## 2010-08-07 NOTE — Assessment & Plan Note (Signed)
Summary: congested/cbs   Vital Signs:  Patient profile:   70 year old male Weight:      173 pounds Temp:     98.0 degrees F oral Pulse rate:   86 / minute Pulse rhythm:   regular BP sitting:   136 / 76  (left arm) Cuff size:   regular  Vitals Entered By: Army Fossa CMA (November 02, 2009 3:06 PM) CC: Pt here for head and chest congestion onset saturday, URI symptoms   CC:  Pt here for head and chest congestion onset saturday and URI symptoms.  History of Present Illness:  URI Symptoms      This is a 70 year old man who presents with URI symptoms.  The symptoms began duration > 3 days ago.  Pt here c/o chest pain on left side especially.  The patient reports nasal congestion, purulent nasal discharge, and dry cough, but denies clear nasal discharge, sore throat, productive cough, earache, and sick contacts.  Associated symptoms include low-grade fever (<100.5 degrees).  The patient denies fever, fever of 100.5-103 degrees, fever of 103.1-104 degrees, fever to >104 degrees, stiff neck, dyspnea, wheezing, rash, vomiting, diarrhea, use of an antipyretic, and response to antipyretic.  The patient denies itchy watery eyes, itchy throat, sneezing, seasonal symptoms, response to antihistamine, headache, muscle aches, and severe fatigue.  The patient denies the following risk factors for Strep sinusitis: unilateral facial pain, unilateral nasal discharge, poor response to decongestant, double sickening, tooth pain, Strep exposure, tender adenopathy, and absence of cough.    Allergies (verified): No Known Drug Allergies  Physical Exam  General:  Well-developed,well-nourished,in no acute distress; alert,appropriate and cooperative throughout examination Ears:  External ear exam shows no significant lesions or deformities.  Otoscopic examination reveals clear canals, tympanic membranes are intact bilaterally without bulging, retraction, inflammation or discharge. Hearing is grossly normal  bilaterally. Nose:  External nasal examination shows no deformity or inflammation. Nasal mucosa are pink and moist without lesions or exudates. Mouth:  Oral mucosa and oropharynx without lesions or exudates.  Teeth in good repair. Neck:  cervical lymphadenopathy.   Lungs:  R decreased breath sounds and L decreased breath sounds.   Heart:  normal rate and no murmur.   Psych:  Oriented X3 and normally interactive.     Impression & Recommendations:  Problem # 1:  BRONCHITIS- ACUTE (ICD-466.0)  The following medications were removed from the medication list:    Cheratussin Ac 100-10 Mg/81ml Syrp (Guaifenesin-codeine) .Marland Kitchen... 1-2 tsp by mouth at bedtime    Proventil Hfa 108 (90 Base) Mcg/act Aers (Albuterol sulfate) .Marland Kitchen... 2 puffs every 6 hours His updated medication list for this problem includes:    Levaquin 500 Mg Tabs (Levofloxacin) .Marland Kitchen... 1 by mouth once daily    Proair Hfa 108 (90 Base) Mcg/act Aers (Albuterol sulfate) .Marland Kitchen... 2 puffs qid as needed    Cheratussin Ac 100-10 Mg/32ml Syrp (Guaifenesin-codeine) .Marland Kitchen... 1-2 tsp by mouth at bedtime as needed cough  Orders: EKG w/ Interpretation (93000) Nebulizer Tx (91478)  Problem # 2:  CHEST PAIN (ICD-786.50)  Orders: EKG w/ Interpretation (93000) Nebulizer Tx (29562)  Complete Medication List: 1)  Proscar 5 Mg Tabs (Finasteride) .Marland Kitchen.. 1 by mouth qd 2)  Zyrtec Allergy 10 Mg Tabs (Cetirizine hcl) .Marland Kitchen.. 1 by mouth once daily 3)  Prilosec 20 Mg Cpdr (Omeprazole) .Marland Kitchen.. 1 by mouth once daily 4)  Zestoretic 20-12.5 Mg Tabs (Lisinopril-hydrochlorothiazide) .Marland Kitchen.. 1 once daily 5)  Pravastatin Sodium 40 Mg Tabs (Pravastatin sodium) .Marland KitchenMarland KitchenMarland Kitchen 1  at bedtime 6)  Ferrous Sulfate 325 (65 Fe) Mg Tabs (Ferrous sulfate) .... Take 1 tab by mouth three times a day. 7)  Levaquin 500 Mg Tabs (Levofloxacin) .Marland Kitchen.. 1 by mouth once daily 8)  Proair Hfa 108 (90 Base) Mcg/act Aers (Albuterol sulfate) .... 2 puffs qid as needed 9)  Prednisone 10 Mg Tabs (Prednisone) .... 3 by  mouth once daily for 3 days then 2 by mouth once daily for 3 days then 1 by mouth once daily for 3 days 10)  Cheratussin Ac 100-10 Mg/44ml Syrp (Guaifenesin-codeine) .Marland Kitchen.. 1-2 tsp by mouth at bedtime as needed cough Prescriptions: CHERATUSSIN AC 100-10 MG/5ML SYRP (GUAIFENESIN-CODEINE) 1-2 tsp by mouth at bedtime as needed cough  #6 oz x 0   Entered and Authorized by:   Loreen Freud DO   Signed by:   Loreen Freud DO on 11/02/2009   Method used:   Print then Give to Patient   RxID:   [id number] PREDNISONE 10 MG TABS (PREDNISONE) 3 by mouth once daily for 3 days then 2 by mouth once daily for 3 days then 1 by mouth once daily for 3 days  #18 x 0   Entered and Authorized by:   Loreen Freud DO   Signed by:   Loreen Freud DO on 11/02/2009   Method used:   [facility] [id number]       [address]       [place], [place]  [address]       Ph: [phone]       Fax: [phone]   RxID:   [id number] PROAIR HFA 108 (90 BASE) MCG/ACT AERS (ALBUTEROL SULFATE) 2 puffs qid as needed  #1 x 0   Entered and Authorized by:   Loreen Freud DO   Signed by:   Loreen Freud DO on 11/02/2009   Method used:   [facility]        [facility] [facility] [address] [id number]       [address]       [place], [place]  [address]       Ph: [phone]       Fax: [phone]   RxID:   [id number] LEVAQUIN 500 MG TABS (LEVOFLOXACIN) 1 by mouth once daily  #10 x 0   Entered and Authorized by:   Loreen Freud DO   Signed by:   Loreen Freud DO on 11/02/2009   Method used:   [facility]        [facility] [id number]       [address]       [place], [place]  [address]       Ph: [phone]       Fax: [phone]   RxID:   [id number]    Medication Administration  Medication # 1:    Medication: Albuterol Sulfate Sol 1mg  unit dose    Exp Date: 06/2011    Lot #: [id number]     Comments: samples  Orders Added: 1)  Est. Patient Level IV [70350] 2)  EKG w/ Interpretation [93000] 3)  Nebulizer Tx [09381]    EKG  Procedure date:  11/02/2009  Findings:      Normal sinus rhythm with rate of:  66 bpm

## 2010-08-07 NOTE — Progress Notes (Signed)
Summary: Med refill  Phone Note Refill Request Message from:  Patient on June 08, 2010 2:22 PM  Refills Requested: Medication #1:  ZYRTEC ALLERGY 10 MG  TABS 1 by mouth once daily  Medication #2:  ZESTORETIC 20-12.5 MG  TABS 1 once daily Initial call taken by: Lucious Groves CMA,  June 08, 2010 2:23 PM    Prescriptions: ZESTORETIC 20-12.5 MG  TABS (LISINOPRIL-HYDROCHLOROTHIAZIDE) 1 once daily  #90 x 2   Entered by:   Lucious Groves CMA   Authorized by:   Marga Melnick MD   Signed by:   Lucious Groves CMA on 06/08/2010   Method used:   Print then Give to Patient   RxID:   [id number] ZYRTEC ALLERGY 10 MG  TABS (CETIRIZINE HCL) 1 by mouth once daily  #90 x 2   Entered by:   Lucious Groves CMA   Authorized by:   Marga Melnick MD   Signed by:   Lucious Groves CMA on 06/08/2010   Method used:   Print then Give to Patient   RxID:   [id number]   Appended Document: Med refill Patient was made aware that he is due for Bun, Creat, and K+. If these have been done at the [facility] please send copies to our office.

## 2010-08-07 NOTE — Letter (Signed)
Summary: Discharge Medications/[facility]  Discharge Medications/[facility]   Imported By: Sherian Rein 01/24/2010 09:35:10  _____________________________________________________________________  External Attachment:    Type:   Image     Comment:   External Document

## 2010-08-07 NOTE — Letter (Signed)
Summary: [facility]  [facility]   Imported By: Lanelle Bal 10/31/2009 12:54:08  _____________________________________________________________________  External Attachment:    Type:   Image     Comment:   External Document

## 2010-08-07 NOTE — Assessment & Plan Note (Signed)
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 He was evaluated in mid December for new-onset iron deficiency anemia. His only GI complaint was that of bloating with vague abdominal discomfort. He underwent serologic testing for celiac sprue. This was negative. He subsequently underwent colonoscopy and upper endoscopy on January 5. Colonoscopy revealed moderate sigmoid diverticulosis and internal hemorrhoids but was otherwise normal including ileal intubation. Upper endoscopy was normal except for prominent ampulla raising the question of adenoma. Biopsies indeed revealed a villous adenoma. Biopsies of the post bulbar duodenum proper were normal without evidence of sprue. He was placed on iron t.i.d. and presents today for followup. His only complaint is that of "gas" which is uncomfortable and wakes him from sleep. No other symptoms. Review his findings to date today.   GI Review of Systems    Reports acid reflux and  bloating.      Denies abdominal pain, belching, chest pain, dysphagia with liquids, dysphagia with solids, heartburn, loss of appetite, nausea, vomiting, vomiting blood, weight loss, and  weight gain.      Reports diverticulosis.     Denies anal fissure, black tarry stools, change in bowel habit, constipation, diarrhea, fecal incontinence, heme positive stool, hemorrhoids, irritable bowel syndrome, jaundice, light color stool, liver problems, rectal bleeding, and  rectal pain.    Current Medications (verified): 1)  Proscar 5 Mg  Tabs (Finasteride) .Marland Kitchen.. 1 By Mouth Qd 2)  Zyrtec Allergy 10 Mg  Tabs (Cetirizine Hcl) .Marland Kitchen.. 1 By Mouth Once  Daily 3)  Prilosec 20 Mg Cpdr (Omeprazole) .Marland Kitchen.. 1 By Mouth Once Daily 4)  Zestoretic 20-12.5 Mg  Tabs (Lisinopril-Hydrochlorothiazide) .Marland Kitchen.. 1 Once Daily 5)  Pravastatin Sodium 40 Mg Tabs (Pravastatin Sodium) .Marland Kitchen.. 1 At Bedtime 6)  Cheratussin Ac 100-10 Mg/1ml Syrp (Guaifenesin-Codeine) .Marland Kitchen.. 1-2 Tsp By Mouth At Bedtime 7)  Nasacort Aq 55 Mcg/act Aers (Triamcinolone Acetonide(Nasal)) .... 2 Sprays Each Nostril Once Daily 8)  Proventil Hfa 108 (90 Base) Mcg/act Aers (Albuterol Sulfate) .... 2 Puffs Every 6 Hours 9)  Ferrous Sulfate 325 (65 Fe) Mg  Tabs (Ferrous Sulfate) .... Take 1 Tab By Mouth Three Times A Day.  Allergies (verified): No Known Drug Allergies  Past History:  Past Medical History: Reviewed history from 05/16/2009 and no changes required. food poisoning 1970s inpatient X 7 days Diverticulosis, colon Benign prostatic hypertrophy, Dr Aldean Ast Colonic polyps, hx of, Dr Marina Goodell Allergic rhinitis GERD Hypertension Hyperlipidemia :LDL 158(2350/1990) ; LDL goal < 90  Past Surgical History: Reviewed history from 05/16/2009 and no changes required. Colon polypectomy X2, neg 2009 ( due ? 2014)  Vasectomy Endoscopy negative 2009, Dr Marina Goodell Lumbar laminectomy @ L4, 07/2008  Family History: Reviewed history from 06/19/2009 and no changes required. Father: throat CA; P uncle prostate CA; 3 M uncles MI (none pre 55yo) Mother:CAD , skin CA Siblings: bro throat CA; sister uterine CA PGM CVA @ 27 Family History of Colon Cancer:Paternal Grandfather  Social History: Reviewed history from 06/19/2009 and no changes required. Retired Alcohol use-no Regular exercise-no Former Smoker: quit 1999 Daily Caffeine Use -1 Illicit Drug Use - no  Review of Systems       unchanged from previous visit  Vital Signs:  Patient profile:   70 year old male Height:      67.5 inches Weight:       172 pounds BMI:     26.64 BSA:     1.91 Pulse rate:   84 / minute Pulse rhythm:   regular BP sitting:   136 / 72  (left arm) Cuff size:   regular  Vitals Entered By: Ok Anis CMA (August 02, 2009 8:52 AM)  Physical Exam  General:  Well developed, well nourished, no acute distress. Head:  Normocephalic and atraumatic. Eyes:  PERRLA, no icterus.conjunctiva slightly pale Mouth:  No deformity or lesions, Neck:  Supple; no masses or thyromegaly. Lungs:  Clear throughout to auscultation. Heart:  Regular rate and rhythm; no murmurs, rubs,  or bruits. Abdomen:  Soft, nontender and nondistended. No masses, hepatosplenomegaly or hernias noted. Normal bowel sounds. Pulses:  Normal pulses noted. Extremities:  no edema Neurologic:  Alert and  oriented x4;  Skin:  no jaundice. less pale Psych:  Alert and cooperative. Normal mood and affect.   Impression & Recommendations:  Problem # 1:  ANEMIA-IRON DEFICIENCY (ICD-9.63) 70 year old gentleman with new onset iron deficiency anemia. Recent colonoscopy and upper endoscopy as described. No significant findings to explain anemia. He appears to have a villous adenoma involving, or in the region of, the ampulla. His only symptom is uncomfortable "gas". The villous lesion of the small bowel would not explain his anemia. I suspect that his anemia is related to more distal small bowel pathology such as AVMs or an occult masslike lesion (which may explain "gas pains").  Plan: #1. Continue iron #2. Followup CBC today #3. Schedule capsule endoscopy to evaluate the small bowel for abnormalities that might explain iron deficiency anemia. The nature of the procedure as well as the risks, benefits, and alternatives were reviewed. He understood and agreed to proceed.  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 This needs further evaluation.  Plan: #1. Upper  endoscopy with side-viewing endoscope after capsule endoscopy performed. The initial procedure as well as risks, benefits, and alternatives were reviewed. He understood and agreed to proceed.  Other Orders: TLB-CBC Platelet - w/Differential (85025-CBCD) EGD (EGD) Capsule Endoscopy (Capsule Endoscopy)  Patient Instructions: 1)  EGD with ERCP scope [facility] 08/30/09 8:30 am  2)  Upper Endoscopy brochure given.  3)  Book # [id number] Attn: Sue Lush 4)  Capsule Endo 08/10/09 8:00 am 5)  All instructions given to patient. 6)  Labs ordered for patient to have drawn today. 7)  The medication list was reviewed and reconciled.  All changed / newly prescribed medications were explained.  A complete medication list was provided to the patient / caregiver. 8)  Copy: Dr. Marga Melnick

## 2010-08-07 NOTE — Procedures (Signed)
Summary: Colonoscopy  Patient: Thomas Hudson Note: All result statuses are Final unless otherwise noted.  Tests: (1) Colonoscopy (COL)   COL Colonoscopy           DONE     [facility]     [address]     [place], [place]  [address]           COLONOSCOPY PROCEDURE REPORT           PATIENT:  [name], [name]  MR#:  [id number]     BIRTHDATE:  02-24-1941, 68 yrs. old  GENDER:  male           ENDOSCOPIST:  Wilhemina Bonito. Eda Keys, MD     Referred by:  Office           PROCEDURE DATE:  07/12/2009     PROCEDURE:  Colonoscopy with snare polypectomy     ASA CLASS:  Class II     INDICATIONS:  iron deficiency anemia (prior colonoscopy     [phone] for hx polyps)           MEDICATIONS:   Fentanyl 75 mcg IV, Versed 9 mg IV           DESCRIPTION OF PROCEDURE:   After the risks benefits and     alternatives of the procedure were thoroughly explained, informed     consent was obtained.  Digital rectal exam was performed and     revealed prolasped hemorrhoids.   The LB [id number]     endoscope was introduced through the anus and advanced to the     cecum, which was identified by both the appendix and ileocecal     valve, without limitations.Time to cecum = 3:19 min. The quality     of the prep was excellent, using MoviPrep.  The instrument was     then slowly withdrawn (t=11:34 min)as the colon was fully     examined.     <<PROCEDUREIMAGES>>           FINDINGS:  Moderate diverticulosis was found in the sigmoid colon.     This was otherwise a normal examination of the colon.  The     terminal ileum appeared normal.   Retroflexed views in the rectum     revealed internal hemorrhoids.    The scope was then withdrawn     from the patient and the procedure completed.           COMPLICATIONS:  None           ENDOSCOPIC IMPRESSION:     1) Moderate diverticulosis in the sigmoid colon     2) Otherwise normal examination     3) Normal terminal ileum     4) Internal hemorrhoids           RECOMMENDATIONS:     1) Follow up colonoscopy in 5 years (hx polyps)     2) EGD today           ______________________________     Wilhemina Bonito. Eda Keys, MD           CC:  Pecola Lawless, MD; The Patient           n.     eSIGNED:   Wilhemina Bonito. Eda Keys at 07/12/2009 03:20 PM           Thomas Hudson, [id number]  Note: An exclamation mark (!) indicates a result that was not dispersed into the flowsheet. Document  Creation Date: 07/12/2009 3:20 PM _______________________________________________________________________  (1) Order result status: Final Collection or observation date-time: 07/12/2009 15:12 Requested date-time:  Receipt date-time:  Reported date-time:  Referring Physician:   Ordering Physician: Fransico Setters [id number]) Specimen Source:  Source: Launa Grill Order Number: [id number] Lab site:

## 2010-08-07 NOTE — Letter (Signed)
Summary: [facility]  [facility]   Imported By: Lanelle Bal 02/01/2010 12:30:31  _____________________________________________________________________  External Attachment:    Type:   Image     Comment:   External Document

## 2010-08-07 NOTE — Procedures (Signed)
Summary: Instruction for procedure/[facility] (out pt)  Instruction for procedure/[facility] (out pt)   Imported By: Sherian Rein 08/05/2009 11:15:51  _____________________________________________________________________  External Attachment:    Type:   Image     Comment:   External Document

## 2010-08-07 NOTE — Miscellaneous (Signed)
Summary: fe so4 mg rx.   Clinical Lists Changes  Medications: Added new medication of FERROUS SULFATE 325 (65 FE) MG  TABS (FERROUS SULFATE) take 1 tab by mouth three times a day. - Signed Rx of FERROUS SULFATE 325 (65 FE) MG  TABS (FERROUS SULFATE) take 1 tab by mouth three times a day.;  #90 x 11;  Signed;  Entered by: Darlyn Read RN;  Authorized by: Hilarie Fredrickson MD;  Method used: [facility] [phone]*, [address], [place], [place], [place]  [address], Ph: [phone], Fax: [phone]    Prescriptions: FERROUS SULFATE 325 (65 FE) MG  TABS (FERROUS SULFATE) take 1 tab by mouth three times a day.  #90 x 11   Entered by:   Darlyn Read RN   Authorized by:   Hilarie Fredrickson MD   Signed by:   Darlyn Read RN on 07/12/2009   Method used:   [facility]        [facility]  [address] [phone][facility] (retail)       [address]       [place], [place]  [address]       Ph: [phone]       Fax: [phone]   RxID:   [id number]

## 2010-08-07 NOTE — Letter (Signed)
Summary: Meds & Instructions/[facility]  Meds & Instructions/[facility]   Imported By: Lanelle Bal 02/01/2010 12:51:00  _____________________________________________________________________  External Attachment:    Type:   Image     Comment:   External Document

## 2010-08-08 ENCOUNTER — Encounter: Payer: Self-pay | Admitting: Internal Medicine

## 2010-08-08 ENCOUNTER — Ambulatory Visit (INDEPENDENT_AMBULATORY_CARE_PROVIDER_SITE_OTHER): Payer: Medicare Other | Admitting: Internal Medicine

## 2010-08-08 ENCOUNTER — Other Ambulatory Visit: Payer: Self-pay | Admitting: Internal Medicine

## 2010-08-08 ENCOUNTER — Ambulatory Visit: Admit: 2010-08-08 | Payer: Self-pay | Admitting: Internal Medicine

## 2010-08-08 DIAGNOSIS — Z0000 Encounter for general adult medical examination without abnormal findings: Secondary | ICD-10-CM

## 2010-08-08 DIAGNOSIS — R0609 Other forms of dyspnea: Secondary | ICD-10-CM

## 2010-08-08 DIAGNOSIS — D649 Anemia, unspecified: Secondary | ICD-10-CM

## 2010-08-08 DIAGNOSIS — R0989 Other specified symptoms and signs involving the circulatory and respiratory systems: Secondary | ICD-10-CM

## 2010-08-08 DIAGNOSIS — I10 Essential (primary) hypertension: Secondary | ICD-10-CM

## 2010-08-08 DIAGNOSIS — Z23 Encounter for immunization: Secondary | ICD-10-CM

## 2010-08-08 DIAGNOSIS — Z136 Encounter for screening for cardiovascular disorders: Secondary | ICD-10-CM

## 2010-08-08 DIAGNOSIS — R05 Cough: Secondary | ICD-10-CM

## 2010-08-08 DIAGNOSIS — R059 Cough, unspecified: Secondary | ICD-10-CM

## 2010-08-08 DIAGNOSIS — E785 Hyperlipidemia, unspecified: Secondary | ICD-10-CM

## 2010-08-08 LAB — CBC WITH DIFFERENTIAL/PLATELET
Basophils Absolute: 0 10*3/uL (ref 0.0–0.1)
Eosinophils Relative: 1.2 % (ref 0.0–5.0)
Monocytes Absolute: 0.7 10*3/uL (ref 0.1–1.0)
Monocytes Relative: 9.2 % (ref 3.0–12.0)
Neutrophils Relative %: 75.8 % (ref 43.0–77.0)
Platelets: 289 10*3/uL (ref 150.0–400.0)
RDW: 13.6 % (ref 11.5–14.6)
WBC: 7.8 10*3/uL (ref 4.5–10.5)

## 2010-08-08 LAB — BASIC METABOLIC PANEL
BUN: 12 mg/dL (ref 6–23)
Creatinine, Ser: 1 mg/dL (ref 0.4–1.5)
GFR: 75.58 mL/min (ref >60.00)
Glucose, Bld: 93 mg/dL (ref 70–99)

## 2010-08-08 LAB — LIPID PANEL
Cholesterol: 208 mg/dL — ABNORMAL HIGH (ref 0–200)
HDL: 40.3 mg/dL (ref >39.00)
Triglycerides: 73 mg/dL (ref 0.0–149.0)
VLDL: 14.6 mg/dL (ref 0.0–40.0)

## 2010-08-08 LAB — HEPATIC FUNCTION PANEL
ALT: 12 U/L (ref 0–53)
AST: 21 U/L (ref 0–37)
Bilirubin, Direct: 0.2 mg/dL (ref 0.0–0.3)
Total Bilirubin: 0.7 mg/dL (ref 0.3–1.2)

## 2010-08-08 LAB — TSH: TSH: 0.5 u[IU]/mL (ref 0.35–5.50)

## 2010-08-08 LAB — LDL CHOLESTEROL, DIRECT: Direct LDL: 156.2 mg/dL

## 2010-08-09 NOTE — Assessment & Plan Note (Signed)
Summary: cough/cbs   Vital Signs:  Patient profile:   70 year old male Weight:      171 pounds BMI:     26.48 Temp:     97.9 degrees F oral Pulse rate:   84 / minute Resp:     16 per minute BP sitting:   110 / 66  (left arm) Cuff size:   large  Vitals Entered By: Shonna Chock CMA (June 18, 2010 3:11 PM) CC: Cough, worse at night (patient with no rest x 3 nights) , URI symptoms   Primary Care Provider:  Marga Melnick, MD  CC:  Cough, worse at night (patient with no rest x 3 nights) , and URI symptoms.  History of Present Illness:      This is a 70 year old man who presents with  RTI  symptoms, onset 2 weeks ago as fever, head congestion  & myalgias  2 days after Flu shot.  The patient  now reports nasal congestion, purulent nasal discharge, sore throat, and dry cough.  Associated symptoms include low-grade fever (<100.5 degrees), some dyspnea  and wheezing.  The patient also reports headache & bilateral facial pain, tooth pain, and tender adenopathy. Rx: Mucinex D  Current Medications (verified): 1)  Proscar 5 Mg  Tabs (Finasteride) .Marland Kitchen.. 1 By Mouth Qd 2)  Zyrtec Allergy 10 Mg  Tabs (Cetirizine Hcl) .Marland Kitchen.. 1 By Mouth Once Daily 3)  Prilosec 20 Mg Cpdr (Omeprazole) .Marland Kitchen.. 1 By Mouth Once Daily 4)  Zestoretic 20-12.5 Mg  Tabs (Lisinopril-Hydrochlorothiazide) .Marland Kitchen.. 1 Once Daily 5)  Pravastatin Sodium 40 Mg Tabs (Pravastatin Sodium) .Marland Kitchen.. 1 At Bedtime  Allergies (verified): No Known Drug Allergies  Physical Exam  General:  in no acute distress; alert,appropriate and cooperative throughout examination Ears:  External ear exam shows no significant lesions or deformities.  Otoscopic examination reveals clear canals, tympanic membranes are intact bilaterally without bulging, retraction, inflammation or discharge. Hearing is grossly normal bilaterally. Nose:  External nasal examination shows no deformity or inflammation. Nasal mucosa are pink and moist without lesions or exudates. Mouth:   Oral mucosa and oropharynx without lesions or exudates.  Teeth in good repair. Hoarse Lungs:  Normal respiratory effort, chest expands symmetrically. Lungs : mininmal rhonchi Heart:  normal rate, regular rhythm, and no murmur. S4  Cervical Nodes:  No lymphadenopathy noted Axillary Nodes:  No palpable lymphadenopathy   Impression & Recommendations:  Problem # 1:  SINUSITIS, ACUTE (ICD-461.9)  The following medications were removed from the medication list:    Amoxicillin-pot Clavulanate 875-125 Mg Tabs (Amoxicillin-pot clavulanate) .Marland Kitchen... 1 every 12 hrs witth a meal    Promethazine Vc/codeine 6.25-5-10 Mg/8ml Syrp (Phenyleph-promethazine-cod) .Marland Kitchen... 1 tsp every 6 hrs as needed His updated medication list for this problem includes:    Smz-tmp Ds 800-160 Mg Tabs (Sulfamethoxazole-trimethoprim) .Marland Kitchen... 1 two times a day with 8 oz of water    Hydromet 5-1.5 Mg/90ml Syrp (Hydrocodone-homatropine) .Marland Kitchen... 1 tsp every 6 hrs as needed for cough    Fluticasone Propionate 50 Mcg/act Susp (Fluticasone propionate) .Marland Kitchen... 1 spray two times a day  Orders: Prescription Created Electronically [phone])  Problem # 2:  BRONCHITIS-ACUTE (ICD-466.0)  The following medications were removed from the medication list:    Amoxicillin-pot Clavulanate 875-125 Mg Tabs (Amoxicillin-pot clavulanate) .Marland Kitchen... 1 every 12 hrs witth a meal    Promethazine Vc/codeine 6.25-5-10 Mg/58ml Syrp (Phenyleph-promethazine-cod) .Marland Kitchen... 1 tsp every 6 hrs as needed His updated medication list for this problem includes:    Smz-tmp Ds  800-160 Mg Tabs (Sulfamethoxazole-trimethoprim) .Marland Kitchen... 1 two times a day with 8 oz of water    Hydromet 5-1.5 Mg/52ml Syrp (Hydrocodone-homatropine) .Marland Kitchen... 1 tsp every 6 hrs as needed for cough  Orders: Prescription Created Electronically [phone])  Complete Medication List: 1)  Proscar 5 Mg Tabs (Finasteride) .Marland Kitchen.. 1 by mouth qd 2)  Zyrtec Allergy 10 Mg Tabs (Cetirizine hcl) .Marland Kitchen.. 1 by mouth once daily 3)  Prilosec 20  Mg Cpdr (Omeprazole) .Marland Kitchen.. 1 by mouth once daily 4)  Zestoretic 20-12.5 Mg Tabs (Lisinopril-hydrochlorothiazide) .Marland Kitchen.. 1 once daily 5)  Pravastatin Sodium 40 Mg Tabs (Pravastatin sodium) .Marland Kitchen.. 1 at bedtime 6)  Smz-tmp Ds 800-160 Mg Tabs (Sulfamethoxazole-trimethoprim) .Marland Kitchen.. 1 two times a day with 8 oz of water 7)  Hydromet 5-1.5 Mg/63ml Syrp (Hydrocodone-homatropine) .Marland Kitchen.. 1 tsp every 6 hrs as needed for cough 8)  Fluticasone Propionate 50 Mcg/act Susp (Fluticasone propionate) .Marland Kitchen.. 1 spray two times a day  Patient Instructions: 1)  Neti pot once daily - two times a day as needed until sinuses are clear. Stop Mucinex D; use Plain Mucinex for thick secretions. 2)  Drink as much NON dairy  fluid as you can tolerate for the next few days. Prescriptions: FLUTICASONE PROPIONATE 50 MCG/ACT SUSP (FLUTICASONE PROPIONATE) 1 spray two times a day  #1 x 11   Entered and Authorized by:   Marga Melnick MD   Signed by:   Marga Melnick MD on 06/18/2010   Method used:   Printed then faxed to ...       [facility] [address] [id number] (retail)       [address]       [place], [place]  [address]       Ph: [phone]       Fax: [phone]   RxID:   [id number] HYDROMET 5-1.5 MG/5ML SYRP (HYDROCODONE-HOMATROPINE) 1 tsp every 6 hrs as needed for cough  #120cc x 0   Entered and Authorized by:   Marga Melnick MD   Signed by:   Marga Melnick MD on 06/18/2010   Method used:   Printed then faxed to ...       [id number] (retail)       [address]       [place], [place]  [address]       Ph: [phone]       Fax: [phone]   RxID:   [id number] SMZ-TMP DS 800-160 MG TABS (SULFAMETHOXAZOLE-TRIMETHOPRIM) 1 two times a day with 8 oz of water  #20 x 0   Entered and Authorized by:   Marga Melnick MD   Signed by:   Marga Melnick MD on 06/18/2010   Method used:   Printed then faxed to ...       [facility] [address] [id number] ([facility])       [address]       [place], [place]  [address]       Ph: [phone]       Fax: [phone]   RxID:   [id number]    Orders Added: 1)  Est. Patient Level III [09381] 2)  Prescription Created Electronically [phone]

## 2010-08-10 NOTE — Procedures (Signed)
Summary: [facility]  [facility]   Imported By: Sherian Rein 10/16/2009 08:51:51  _____________________________________________________________________  External Attachment:    Type:   Image     Comment:   External Document

## 2010-08-10 NOTE — Procedures (Signed)
Summary: Instructions for procedure/[facility]  Instructions for procedure/[facility]   Imported By: Sherian Rein 08/18/2009 16:03:32  _____________________________________________________________________  External Attachment:    Type:   Image     Comment:   External Document

## 2010-08-14 ENCOUNTER — Other Ambulatory Visit: Payer: Self-pay | Admitting: Internal Medicine

## 2010-08-14 ENCOUNTER — Ambulatory Visit (INDEPENDENT_AMBULATORY_CARE_PROVIDER_SITE_OTHER)
Admission: RE | Admit: 2010-08-14 | Discharge: 2010-08-14 | Disposition: A | Discharge status: Home / Self Care | Payer: Medicare Other | Source: Ambulatory Visit | Attending: Internal Medicine | Admitting: Internal Medicine

## 2010-08-14 DIAGNOSIS — R0609 Other forms of dyspnea: Secondary | ICD-10-CM

## 2010-08-14 DIAGNOSIS — R0989 Other specified symptoms and signs involving the circulatory and respiratory systems: Secondary | ICD-10-CM

## 2010-08-14 DIAGNOSIS — R05 Cough: Secondary | ICD-10-CM

## 2010-08-15 NOTE — Assessment & Plan Note (Signed)
Summary: est medicare yearly/ph    lmom    1/31   ///sph   Vital Signs:  Patient profile:   70 year old male Height:      67.25 inches Weight:      169.8 pounds BMI:     26.49 Temp:     98.1 degrees F oral Pulse rate:   60 / minute Resp:     14 per minute BP sitting:   140 / 72  (left arm) Cuff size:   large  Vitals Entered By: Shonna Chock CMA (August 08, 2010 10:51 AM) CC: CPX with fasting labs , Cough, Heartburn, Lipid Management  Vision Screening:Left eye with correction: 20 / 30 Right eye with correction: 20 / 50 Both eyes with correction: 20 / 30       Vision Comments: Patient wears glasses most of the time  Vision Entered By: Shonna Chock CMA (August 08, 2010 11:07 AM)   Primary Care Provider:  Marga Melnick, MD  CC:  CPX with fasting labs , Cough, Heartburn, and Lipid Management.  History of Present Illness: Here for Medicare AWV: 1.Risk factors based on Past M, S, F history:see Diagnoses; chart updated 2.Physical Activities: no regular program 3.Depression/mood: no issues 4.Hearing: whisper decreased @ 6 ft bilaterally 5.ADL's: no limitations 6.Fall Risk: no issues 7.Home Safety: no issues 8.Height, weight, &visual acuity:see VS 9.Counseling:POA & Living Will "old" (review & update recommended) 10.Labs ordered based on risk factors: see Orders 11. Referral Coordination: " I  want  to have breathing checked". Colonoscopy & PSA monitor current. 12.Care Plan:see Instructions 13.Cognitive Assessment: Oriented X3;   memory & recall  excellent   ;   difficulty with subtraction ( he left school after 9th grade to support family of 11 children ); mood & affect normal.    Cough: This is a 70 year old man who presents with Cough for 1-2 months.  The patient reports mainly non-productive cough with some wheezing @ night, and exertional dyspnea, but denies pleuritic chest pain, shortness of breath, fever, and hemoptysis.  Associated symtpoms include chronic  rhinitis.  The patient denies the following symptoms: active cold/URI symptoms, weight loss, and acid reflux symptoms if on PPI  The cough is worse with lying down and heat  exposure.  Partially effective prior treatments have included Rx  cough medication.  PMH of > 50 pack years.He is on  Zestoretic, which contains an ACE-I.    Hyperlipidemia Follow-Up: He did not refill statin @ [facility]. The patient denies the following symptoms: chest pain/pressure, palpitations, syncope, and pedal edema. Dietary compliance has been fair.       GERD: He is asymptomatic on Prilosec.  The patient denies sour taste in mouth, epigastric pain, trouble swallowing, weight loss, and weight gain.  The patient denies the following alarm features: melena, dysphagia, hematemesis, and vomiting.    Lipid Management History:      Positive NCEP/ATP III risk factors include male age 24 years old or older, HDL cholesterol less than 40, and hypertension.  Negative NCEP/ATP III risk factors include non-diabetic, no family history for ischemic heart disease, non-tobacco-user status, no ASHD (atherosclerotic heart disease), no prior stroke/TIA, no peripheral vascular disease, and no history of aortic aneurysm.     Preventive Screening-Counseling & Management  Alcohol-Tobacco     Alcohol drinks/day: 0  Caffeine-Diet-Exercise     Caffeine use/day: 2-3 cups/ day  Hep-HIV-STD-Contraception     Dental Visit-last 6 months yes  Sun Exposure-Excessive: no  Safety-Violence-Falls     Risk analyst Use: yes     Smoke Detectors: yes      Blood Transfusions:  no.        Travel History:  [place] cruise.    Current Medications (verified): 1)  Proscar 5 Mg  Tabs (Finasteride) .Marland Kitchen.. 1 By Mouth Qd 2)  Zyrtec Allergy 10 Mg  Tabs (Cetirizine Hcl) .Marland Kitchen.. 1 By Mouth Once Daily 3)  Prilosec 20 Mg Cpdr (Omeprazole) .Marland Kitchen.. 1 By Mouth Once Daily 4)  Zestoretic 20-12.5 Mg  Tabs (Lisinopril-Hydrochlorothiazide) .Marland Kitchen.. 1 Once Daily  Allergies  (verified): No Known Drug Allergies  Past History:  Past Medical History: Food poisoning 1970s inpatient X 7 days Diverticulosis, colon Benign prostatic hypertrophy, Dr  Vic Blackbird Colonic polyps, PMH  of, Dr Yancey Flemings Allergic rhinitis GERD Hypertension Hyperlipidemia :NMR  Lipoprofile :LDL 158(2350/1990) ; LDL goal < 90. Framingham Study LDL goal = < 130.  Past Surgical History: Colon polypectomy X2, negative  2009 ( due ? 2014)  , Dr Marina Goodell                                                                                Vasectomy Endoscopy negative 2009, Dr Marina Goodell Lumbar laminectomy @ L4, 07/2008  Family History: Father: throat cancer; P uncle: prostate cancer; 3 M uncles: MI (none pre 55yo) Mother:CAD , skin cancer Siblings: bro :throat cancer; sister: uterine cancer PGM :CVA @ 107 Paternal Grandfather: colon cancer  Social History: Retired Alcohol use-no Regular exercise-no Former Smoker: quit 1999 Caffeine use/day:  2-3 cups/ day Dental Care w/in 6 mos.:  yes Sun Exposure-Excessive:  no Risk analyst Use:  yes Blood Transfusions:  no  Review of Systems  The patient denies anorexia, vision loss, hoarseness, suspicious skin lesions, unusual weight change, abnormal bleeding, enlarged lymph nodes, and angioedema.    Physical Exam  General:  well-nourished,in no acute distress; alert,appropriate and cooperative throughout examination Head:  Normocephalic and atraumatic without obvious abnormalities. Eyes:  No corneal or conjunctival inflammation noted. Perrla. Funduscopic exam benign, without hemorrhages, exudates or papilledema.  Ears:  External ear exam shows no significant lesions or deformities.  Otoscopic examination reveals clear canals, tympanic membranes are intact bilaterally without bulging, retraction, inflammation or discharge. Hearing is grossly normal bilaterally. Nose:  External nasal examination shows no deformity or inflammation. Nasal mucosa are pink  and moist without lesions or exudates. Mouth:  Oral mucosa and oropharynx without lesions or exudates.  Teeth in good repair. no pharyngeal erythema.   Neck:  No deformities, masses, or tenderness noted. Lungs:  Normal respiratory effort, chest expands symmetrically. Lungs are clear to auscultation, no crackles or wheezes. Heart:  Normal rate and regular rhythm. S1 and S2 normal without gallop, murmur, click, rub .S4 Genitalia:  Dr Aldean Ast Prostate:  Dr Aldean Ast Msk:  No deformity or scoliosis noted of thoracic or lumbar spine.   Pulses:  R and L carotid,radial,dorsalis pedis and posterior tibial pulses are full and equal bilaterally Extremities:  No clubbing, cyanosis, edema, or deformity noted with normal full range of motion of all joints.   Neurologic:  alert & oriented X3 and DTRs symmetrical and normal.  Skin:  Intact without suspicious lesions or rashes Cervical Nodes:  No lymphadenopathy noted Axillary Nodes:  No palpable lymphadenopathy Psych:  memory intact for recent and remote, normally interactive, and good eye contact.     Impression & Recommendations:  Problem # 1:  PREVENTIVE HEALTH CARE (ICD-V70.0)  Orders: Medicare -1st Annual Wellness Visit [phone])  Problem # 2:  COUGH (ICD-786.2)  ? related to ACE-I  Orders: T-2 View CXR (71020TC)  Problem # 3:  DYSPNEA/SHORTNESS OF BREATH (ICD-786.09)  The following medications were removed from the medication list:    Zestoretic 20-12.5 Mg Tabs (Lisinopril-hydrochlorothiazide) .Marland Kitchen... 1 once daily  Orders: Venipuncture (82956) T-2 View CXR (71020TC)  Problem # 4:  ANEMIA (ICD-285.9)  PMH of  Orders: Venipuncture (21308) Specimen Handling (65784) TLB-CBC Platelet - w/Differential (85025-CBCD)  Problem # 5:  HYPERLIPIDEMIA (ICD-272.4)  The following medications were removed from the medication list:    Pravastatin Sodium 40 Mg Tabs (Pravastatin sodium) .Marland Kitchen... 1 at bedtime His updated medication list for this  problem includes:    Pravastatin Sodium 40 Mg Tabs (Pravastatin sodium) .Marland Kitchen... 1 at bedtime  Orders: Venipuncture (69629) Specimen Handling (52841) TLB-Lipid Panel (80061-LIPID) TLB-Hepatic/Liver Function Pnl (80076-HEPATIC) TLB-TSH (Thyroid Stimulating Hormone) (84443-TSH)  Problem # 6:  GERD (ICD-530.81)  His updated medication list for this problem includes:    Prilosec 20 Mg Cpdr (Omeprazole) .Marland Kitchen... 1 by mouth once daily  Problem # 7:  HYPERTENSION (ICD-401.9)  The following medications were removed from the medication list:    Zestoretic 20-12.5 Mg Tabs (Lisinopril-hydrochlorothiazide) .Marland Kitchen... 1 once daily His updated medication list for this problem includes:    Losartan Potassium 100 Mg Tabs (Losartan potassium) .Marland Kitchen... 1 once daily inplace of zestoretic due to cough  Orders: Venipuncture (32440) EKG w/ Interpretation (93000) TLB-BMP (Basic Metabolic Panel-BMET) (80048-METABOL)  Complete Medication List: 1)  Proscar 5 Mg Tabs (Finasteride) .Marland Kitchen.. 1 by mouth qd 2)  Zyrtec Allergy 10 Mg Tabs (Cetirizine hcl) .Marland Kitchen.. 1 by mouth once daily 3)  Prilosec 20 Mg Cpdr (Omeprazole) .Marland Kitchen.. 1 by mouth once daily 4)  Losartan Potassium 100 Mg Tabs (Losartan potassium) .Marland Kitchen.. 1 once daily inplace of zestoretic due to cough 5)  Pravastatin Sodium 40 Mg Tabs (Pravastatin sodium) .Marland Kitchen.. 1 at bedtime  Other Orders: Tdap => 18yrs IM (10272) Admin 1st Vaccine (53664)  Lipid Assessment/Plan:      Based on NCEP/ATP III, the patient's risk factor category is "2 or more risk factors and a calculated 10 year CAD risk of > 20%".  The patient's lipid goals are as follows: Total cholesterol goal is 200; LDL cholesterol goal is 130; HDL cholesterol goal is 40; Triglyceride goal is 150.  His LDL cholesterol goal has not been met.  Secondary causes for hyperlipidemia have been ruled out.  He has been counseled on adjunctive measures for lowering his cholesterol.    Patient Instructions: 1)  Stop Zestoretic  & start  Losartan.Check your Blood Pressure regularly. If it is above: 135/85 ON AVERAGE  you should  call. PFTs may be needed if cough persists. Prescriptions: PRAVASTATIN SODIUM 40 MG TABS (PRAVASTATIN SODIUM) 1 at bedtime  #90 x 3   Entered and Authorized by:   Marga Melnick MD   Signed by:   Marga Melnick MD on 08/08/2010   Method used:   Print then Give to Patient   RxID:   [id number] LOSARTAN POTASSIUM 100 MG TABS (LOSARTAN POTASSIUM) 1 once daily inplace of Zestoretic due to cough  #90 x 3  Entered and Authorized by:   Marga Melnick MD   Signed by:   Marga Melnick MD on 08/08/2010   Method used:   Print then Give to Patient   RxID:   [id number]    Orders Added: 1)  Tdap => 31yrs IM [90715] 2)  Admin 1st Vaccine [90471] 3)  Medicare -1st Annual Wellness Visit [G0438] 4)  Est. Patient Level III [86578] 5)  Venipuncture [36415] 6)  EKG w/ Interpretation [93000] 7)  T-2 View CXR [71020TC] 8)  Specimen Handling [99000] 9)  TLB-Lipid Panel [80061-LIPID] 10)  TLB-BMP (Basic Metabolic Panel-BMET) [80048-METABOL] 11)  TLB-CBC Platelet - w/Differential [85025-CBCD] 12)  TLB-Hepatic/Liver Function Pnl [80076-HEPATIC] 13)  TLB-TSH (Thyroid Stimulating Hormone) [46962-XBM]   Immunizations Administered:  Tetanus Vaccine:    Vaccine Type: Tdap    Site: right deltoid    Mfr: GlaxoSmithKline    Dose: 0.5 ml    Route: IM    Given by: Shonna Chock CMA    Exp. Date: 04/26/2012    Lot #: [id number]    VIS given: 05/25/08 version given August 08, 2010.   Immunizations Administered:  Tetanus Vaccine:    Vaccine Type: Tdap    Site: right deltoid    Mfr: GlaxoSmithKline    Dose: 0.5 ml    Route: IM    Given by: Shonna Chock CMA    Exp. Date: 04/26/2012    Lot #: [id number]    VIS given: 05/25/08 version given August 08, 2010.

## 2010-09-17 ENCOUNTER — Ambulatory Visit (INDEPENDENT_AMBULATORY_CARE_PROVIDER_SITE_OTHER): Payer: Medicare Other | Admitting: Internal Medicine

## 2010-09-17 ENCOUNTER — Encounter: Payer: Self-pay | Admitting: Internal Medicine

## 2010-09-17 DIAGNOSIS — J019 Acute sinusitis, unspecified: Secondary | ICD-10-CM

## 2010-09-17 DIAGNOSIS — J209 Acute bronchitis, unspecified: Secondary | ICD-10-CM

## 2010-09-18 ENCOUNTER — Encounter: Payer: Self-pay | Admitting: Internal Medicine

## 2010-09-18 DIAGNOSIS — J209 Acute bronchitis, unspecified: Secondary | ICD-10-CM

## 2010-09-25 NOTE — Miscellaneous (Signed)
Summary: Orders Update  Clinical Lists Changes  Orders: Added new Service order of Prescription Created Electronically (G8553) - Signed 

## 2010-09-25 NOTE — Assessment & Plan Note (Signed)
Summary: cough,congested/cbs   Vital Signs:  Patient profile:   70 year old male Weight:      175.4 pounds BMI:     27.37 Temp:     98.3 degrees F oral Pulse rate:   76 / minute Resp:     15 per minute BP sitting:   140 / 78  (left arm) Cuff size:   large  Vitals Entered By: Shonna Chock CMA (September 17, 2010 10:22 AM) CC: Cough and congestion since last Tuesday , URI symptoms   Primary Care Provider:  Marga Melnick, MD  CC:  Cough and congestion since last Tuesday  and URI symptoms.  History of Present Illness:    Onset as head congestion 09/11/2010; he now reports purulent nasal discharge and productive cough, but denies sore throat and earache.  Associated symptoms include dyspnea and wheezing.  The patient denies fever.  The patient also reports some frontal headache & gum pain.  The patient denies the following risk factors for Strep sinusitis: bilateral  facial pain and tender adenopathy. Rx: OTC "mucus relief"   Current Medications (verified): 1)  Proscar 5 Mg  Tabs (Finasteride) .Marland Kitchen.. 1 By Mouth Qd 2)  Zyrtec Allergy 10 Mg  Tabs (Cetirizine Hcl) .Marland Kitchen.. 1 By Mouth Once Daily 3)  Prilosec 20 Mg Cpdr (Omeprazole) .Marland Kitchen.. 1 By Mouth Once Daily 4)  Losartan Potassium 100 Mg Tabs (Losartan Potassium) .Marland Kitchen.. 1 Once Daily Inplace of Zestoretic Due To Cough 5)  Pravastatin Sodium 40 Mg Tabs (Pravastatin Sodium) .Marland Kitchen.. 1 At Bedtime 6)  Red Yeast Rice 600 Mg Caps (Red Yeast Rice Extract) .Marland Kitchen.. 1 By Mouth Once Daily  Allergies (verified): No Known Drug Allergies  Physical Exam  General:  in no acute distress; alert,appropriate and cooperative throughout examination Ears:  External ear exam shows no significant lesions or deformities.  Otoscopic examination reveals clear canals, tympanic membranes are intact bilaterally without bulging, retraction, inflammation or discharge. Hearing is grossly normal bilaterally. Nose:  External nasal examination shows no deformity or inflammation. Nasal  mucosa are pink and moist without lesions or exudates. Hyponasal speech Mouth:  Oral mucosa and oropharynx without lesions or exudates.  Teeth in good repair. no pharyngeal erythema.   Lungs:  Normal respiratory effort, chest expands symmetrically. Lungs : low grade rhonchi &  wheezes. Heart:  Normal rate and regular rhythm. S1 and S2 normal without gallop, murmur, click, rub .S4 Extremities:  No clubbing, cyanosis, edema Cervical Nodes:  No lymphadenopathy noted Axillary Nodes:  No palpable lymphadenopathy   Impression & Recommendations:  Problem # 1:  BRONCHITIS-ACUTE (ICD-466.0)  His updated medication list for this problem includes:    Amoxicillin 500 Mg Caps (Amoxicillin) .Marland Kitchen... 1 three times a day    Hydromet 5-1.5 Mg/17ml Syrp (Hydrocodone-homatropine) .Marland Kitchen... 1 tsp every 6 hrs as needed    Advair Diskus 250-50 Mcg/dose Aepb (Fluticasone-salmeterol) .Marland Kitchen... 1 inhalation every 12 hrs ; gargle & spit after use  Problem # 2:  SINUSITIS, ACUTE (ICD-461.9)  recurrence  His updated medication list for this problem includes:    Amoxicillin 500 Mg Caps (Amoxicillin) .Marland Kitchen... 1 three times a day    Hydromet 5-1.5 Mg/62ml Syrp (Hydrocodone-homatropine) .Marland Kitchen... 1 tsp every 6 hrs as needed  Complete Medication List: 1)  Proscar 5 Mg Tabs (Finasteride) .Marland Kitchen.. 1 by mouth qd 2)  Zyrtec Allergy 10 Mg Tabs (Cetirizine hcl) .Marland Kitchen.. 1 by mouth once daily 3)  Prilosec 20 Mg Cpdr (Omeprazole) .Marland Kitchen.. 1 by mouth once daily 4)  Losartan Potassium  100 Mg Tabs (Losartan potassium) .Marland Kitchen.. 1 once daily inplace of zestoretic due to cough 5)  Pravastatin Sodium 40 Mg Tabs (Pravastatin sodium) .Marland Kitchen.. 1 at bedtime 6)  Amoxicillin 500 Mg Caps (Amoxicillin) .Marland Kitchen.. 1 three times a day 7)  Hydromet 5-1.5 Mg/66ml Syrp (Hydrocodone-homatropine) .Marland Kitchen.. 1 tsp every 6 hrs as needed 8)  Advair Diskus 250-50 Mcg/dose Aepb (Fluticasone-salmeterol) .Marland Kitchen.. 1 inhalation every 12 hrs ; gargle & spit after use  Patient Instructions: 1)  Neti pot once  daily two times a day as needed . 2)  Drink as much  NON dairy fluid as you can tolerate for the next few days. Prescriptions: ADVAIR DISKUS 250-50 MCG/DOSE AEPB (FLUTICASONE-SALMETEROL) 1 inhalation every 12 hrs ; gargle & spit after use  #1 x 5   Entered and Authorized by:   Marga Melnick MD   Signed by:   Marga Melnick MD on 09/17/2010   Method used:   Print then Give to Patient   RxID:   [id number] HYDROMET 5-1.5 MG/5ML SYRP (HYDROCODONE-HOMATROPINE) 1 tsp every 6 hrs as needed  #120cc x 0   Entered and Authorized by:   Marga Melnick MD   Signed by:   Marga Melnick MD on 09/17/2010   Method used:   Printed then faxed to ...       [facility]  [address] [id number] (retail)       [address]       [place], [place]  [address]       Ph: [phone]       Fax: [phone]   RxID:   [id number] AMOXICILLIN 500 MG CAPS (AMOXICILLIN) 1 three times a day  #30 x 0   Entered and Authorized by:   Marga Melnick MD   Signed by:   Marga Melnick MD on 09/17/2010   Method used:   Electronically to        [facility]  [address] [id number] ([facility]       [address]       [place], [place]  [address]       Ph: [phone]       Fax: [phone]   RxID:   [id number]    Orders Added: 1)  Est. Patient Level III [62376]

## 2010-10-01 ENCOUNTER — Telehealth: Payer: Self-pay | Admitting: Internal Medicine

## 2010-10-01 NOTE — Telephone Encounter (Signed)
Spoke with patient's wife and she knows to call Dr. Graylon Good office per Dr. Marina Goodell.

## 2010-10-01 NOTE — Telephone Encounter (Signed)
Patient's wife states he has surgery last July with Dr. Marilynn Rail for her gallbladder, and a growth. He states that Friday and Sat he passed some blood. Pt was constipated and has h/o internal hemorrhoids. Wife is concerned because on the incision from the surgery with [place] there is a bulge the size of an egg at the top of the incision to the right. Patients wife states she called Dr. Graylon Good office and he is out of town for 2 weeks. Dr. Marina Goodell please advise.

## 2010-10-01 NOTE — Telephone Encounter (Signed)
Needs to see his surgeon about that concern

## 2010-10-22 LAB — URINE CULTURE
Colony Count: NO GROWTH
Special Requests: NEGATIVE

## 2010-10-22 LAB — COMPREHENSIVE METABOLIC PANEL
ALT: 19 U/L (ref 0–53)
Alkaline Phosphatase: 45 U/L (ref 39–117)
CO2: 27 mEq/L (ref 19–32)
Chloride: 102 mEq/L (ref 96–112)
Glucose, Bld: 109 mg/dL — ABNORMAL HIGH (ref 70–99)
Potassium: 3.7 mEq/L (ref 3.5–5.1)
Sodium: 138 mEq/L (ref 135–145)
Total Bilirubin: 0.9 mg/dL (ref 0.3–1.2)
Total Protein: 6.5 g/dL (ref 6.0–8.3)

## 2010-10-22 LAB — DIFFERENTIAL
Basophils Relative: 1 % (ref 0–1)
Eosinophils Absolute: 0.2 10*3/uL (ref 0.0–0.7)
Monocytes Relative: 9 % (ref 3–12)
Neutrophils Relative %: 69 % (ref 43–77)

## 2010-10-22 LAB — CBC
Hemoglobin: 11.2 g/dL — ABNORMAL LOW (ref 13.0–17.0)
RBC: 4.48 MIL/uL (ref 4.22–5.81)
RDW: 17.7 % — ABNORMAL HIGH (ref 11.5–15.5)
WBC: 5 10*3/uL (ref 4.0–10.5)

## 2010-10-22 LAB — URINALYSIS, ROUTINE W REFLEX MICROSCOPIC
Bilirubin Urine: NEGATIVE
Hgb urine dipstick: NEGATIVE
Specific Gravity, Urine: 1.025 (ref 1.005–1.030)
Urobilinogen, UA: 0.2 mg/dL (ref 0.0–1.0)
pH: 5.5 (ref 5.0–8.0)

## 2010-10-22 LAB — PROTIME-INR: INR: 1 (ref 0.00–1.49)

## 2010-10-23 ENCOUNTER — Other Ambulatory Visit (INDEPENDENT_AMBULATORY_CARE_PROVIDER_SITE_OTHER): Payer: Medicare Other

## 2010-10-23 DIAGNOSIS — T887XXA Unspecified adverse effect of drug or medicament, initial encounter: Secondary | ICD-10-CM

## 2010-10-23 DIAGNOSIS — E785 Hyperlipidemia, unspecified: Secondary | ICD-10-CM

## 2010-10-23 LAB — HEPATIC FUNCTION PANEL
ALT: 14 U/L (ref 0–53)
AST: 20 U/L (ref 0–37)
Alkaline Phosphatase: 60 U/L (ref 39–117)
Bilirubin, Direct: 0.2 mg/dL (ref 0.0–0.3)
Total Protein: 6.5 g/dL (ref 6.0–8.3)

## 2010-10-23 LAB — LIPID PANEL: Cholesterol: 141 mg/dL (ref 0–200)

## 2010-11-06 ENCOUNTER — Encounter: Payer: Self-pay | Admitting: Internal Medicine

## 2010-11-20 NOTE — Assessment & Plan Note (Signed)
[facility]                         GASTROENTEROLOGY OFFICE NOTE   NAME:ELLENKaycee, Mcgaugh                         MRN:          [id number]  DATE:06/17/2007                            DOB:          1941/07/06    HISTORY:  Mr. Costilla presents today for a followup. He is accompanied by  his wife. He has a history of hypertension, reflux disease, and benign  prostatic hypertrophy. He was last evaluated in the office January 30, 2007  for dysphagia. He underwent upper endoscopy March 25, 2007. He was  found the have a benign distal esophageal stricture secondary to reflux  disease. No other abnormalities. He was dilated with a 54-French Maloney  dilator. Since that time, he has continued on Prilosec. He has had no  further dysphagia. No problems with heartburn or indigestion. He is  pleased. The patient also has a family history of colon cancer and  underwent screening colonoscopy in September 2003. He was found the have  nonadenomatous colon polyps and diverticulosis. Followup in 5 years  recommended. He wishes to proceed with his surveillance colonoscopy,  before year's end if possible. He denies abdominal pain, change in bowel  habits or rectal bleeding.   CURRENT MEDICATIONS:  Lisinopril/HCTZ, Zyrtec, Prilosec, and Proscar.   PHYSICAL EXAMINATION:  Well-appearing male in no acute distress.  Blood pressure is 112/62, heart rate is 72 and regular, weight is 175.2  pounds.  HEENT:  Sclera anicteric. Conjunctiva pink. Oral mucosa is intact. No  adenopathy.  LUNGS:  Clear.  HEART:  Regular.  ABDOMEN:  Soft without tenderness, mass or hernia.   IMPRESSION:  1. 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      Currently asymptomatic post dilation on Prilosec.  2. Family history of colon cancer and a personal history of colon      polyps. Due for surveillance colonoscopy.  3. History of incidental diverticulosis.   RECOMMENDATIONS:  1.  Continue Prilosec.  2. Schedule colonoscopy with polypectomy if needed. The nature of the      procedure as well as risks, benefits, and alternatives were      reviewed. He understood and agreed to proceed.  3. Ongoing general medical care with Dr. Alwyn Ren.     Wilhemina Bonito. Marina Goodell, MD  Electronically Signed   JNP/MedQ  DD: 06/17/2007  DT: 06/18/2007  Job #: [id number]   cc:   Titus Dubin. Alwyn Ren, MD,FACP,FCCP

## 2010-11-20 NOTE — Op Note (Signed)
Thomas Hudson, Thomas Hudson                ACCOUNT NO.:  [id number]   MEDICAL RECORD NO.:  [id number]          PATIENT TYPE:  AMB   LOCATION:  DAY                          FACILITY:  [facility]   PHYSICIAN:  Georges Lynch. Gioffre, M.D.DATE OF BIRTH:  1940-08-06   DATE OF PROCEDURE:  07/12/2008  DATE OF DISCHARGE:                               OPERATIVE REPORT   SURGEON:  Georges Lynch. Darrelyn Hillock, M.D.   ASSISTANT:  Marlowe Kays, MD   PREOPERATIVE DIAGNOSES:  1. Spinal stenosis at L4-5.  2. 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   POSTOPERATIVE DIAGNOSES:  1. Spinal stenosis at L4-5.  2. 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   OPERATION:  1. Central decompressive lumbar laminectomy at L4-5.  2. Microdiskectomy at L4-5 on the right.   PROCEDURE:  Under general anesthesia the patient first had 1 gram of IV  Ancef.  Two needles were placed in the back for localization purposes.  After prep and draping were carried out, X-ray was taken to verify the  position.  Following that an incision was made over the L4-5 space.  Bleeders identified and cauterized.  The muscle then was separated from  the lamina and spinous processes bilaterally at L4-5.  Another x-ray was  taken.  Following that, I then inserted the [facility] retractors and  carried out a central decompressive lumbar laminectomy.  I removed the  spinous process of L4.  I went down and completed the decompression.  We  went out far laterally on the right, did a nice decompression of the  lateral recess and cauterized lateral recess veins.  We identified the  L5 root and the disk space and a cruciate incision was made in the disk  space and a diskectomy was carried out.  Note:  We noted on the MRI that  that fragment migrated from the 4-5 disk space proximally so we went up  proximally and the root above was well isolated and the disk fragment  was very large and literally migrated up under the root.  I then gently  took a nerve hook and teased out a large fragment of disk and the root  now was completely free.  We probed the foramina at that level and below  and they were now open and both roots were free.  The dura was free.  We  went back into the space again, made sure we were at the proper space.  X-ray was taken again to verify the 04/05 space.  We thoroughly  irrigated out the area.  We searched for more disk and there were no  other loose disk material noted.  I then irrigated the area again, dried  it out then injected 10 mL of FloSeal into the surgical site.  I closed  the wound  layers usual fashion, left a small deep distal part of the wound open  for drainage purposes.  I  then inserted some Gelfoam into the  submuscular region.  Sterile Neosporin bundle dressing was applied.  The  patient left the operating room in satisfactory condition.           ______________________________  Georges Lynch Darrelyn Hillock, M.D.     RAG/MEDQ  D:  07/12/2008  T:  07/13/2008  Job:  [id number]   cc:   Marlowe Kays, M.D.  Fax: [phone]   Titus Dubin. Alwyn Ren, MD,FACP,FCCP  [phone] [place]  [place] [address]

## 2010-11-20 NOTE — Assessment & Plan Note (Signed)
[facility]                         GASTROENTEROLOGY OFFICE NOTE   NAME:Thomas Hudson, Thomas Hudson                         MRN:          [id number]  DATE:01/30/2007                            DOB:          [birth year]    REASON FOR CONSULTATION:  Dysphagia.   HISTORY:  This is a 70 year old white male with a history of  hypertension, chronic gastroesophageal reflux disease, and benign  prostatic hypertrophy.  He is referred today through the courtesy for  Dr. Alwyn Ren regarding dysphagia.  The patient has longstanding problems  heartburn and indigestion.  Over the past year, he has been on Prilosec  20 mg daily with good control of symptoms.  Off Prilosec, significant  heartburn occurs.  He has also had intermittent solid food dysphagia for  3 to 4 years.  This has worsened over the past year.  He has had steady  weight gain.  No nausea, vomiting, abdominal pain, or other symptoms.  He also has a family history of colon cancer, and underwent screening  colonoscopy in September 2003.  Non-adenomatous polyps and  diverticulosis found.  Followup in 5 years recommended.   CURRENT MEDICATIONS:  Include:  1. Lisinopril/hydrochlorothiazide 20/12.5 mg daily.  2. Zyrtec 10 mg daily.  3. Prilosec 20 mg daily.  4. Proscar 5 mg daily.   ALLERGIES:  NO KNOWN DRUG ALLERGIES.   PHYSICAL EXAMINATION:  Well-appearing male in no acute distress.  Blood pressure is 122/70.  Heart rate is 68 and regular.  Weight is 176  pounds.  HEENT:  Sclerae anicteric.  Oral mucosa is intact.  No adenopathy.  LUNGS:  Clear.  HEART:  Regular.  ABDOMEN:  Soft without tenderness, mass, or hernia.   IMPRESSION:  1. Chronic gastroesophageal reflux disease with intermittent      dysphagia.  Rule out peptic stricture.  Rule out Barrett's      esophagus.  2. Family history of colon cancer.  Due for surveillance colonoscopy.   RECOMMENDATIONS:  1. Continue Prilosec OTC 20 mg daily.  2. Reflux  precautions.  3. Schedule upper endoscopy with probable esophageal dilation.  The      nature of the procedure, as well as the risks, benefits, and      alternatives were reviewed.  He understood      and agreed to proceed.  4. Surveillance colonoscopy to be arranged after esophageal issues      addressed.     Wilhemina Bonito. Marina Goodell, MD  Electronically Signed    JNP/MedQ  DD: 01/30/2007  DT: 01/31/2007  Job #: [id number]   cc:   Titus Dubin. Alwyn Ren, MD,FACP,FCCP

## 2011-02-18 ENCOUNTER — Other Ambulatory Visit: Payer: Self-pay | Admitting: Internal Medicine

## 2011-02-18 MED ORDER — CETIRIZINE HCL 10 MG PO TABS: 10.0000 mg | ORAL_TABLET | Freq: Every day | ORAL | Status: DC

## 2011-02-18 MED ORDER — LOSARTAN POTASSIUM 100 MG PO TABS: 100.0000 mg | ORAL_TABLET | Freq: Every day | ORAL | Status: DC

## 2011-02-18 MED ORDER — OMEPRAZOLE 20 MG PO CPDR: 20.0000 mg | DELAYED_RELEASE_CAPSULE | Freq: Every day | ORAL | Status: DC

## 2011-02-18 MED ORDER — PRAVASTATIN SODIUM 40 MG PO TABS: 40.0000 mg | ORAL_TABLET | Freq: Every day | ORAL | Status: DC

## 2011-02-18 NOTE — Telephone Encounter (Signed)
Patient wants  rx for  pravachol - prilosec dr - zyrtec -cozaar 90 day supply - 3 refills - patient will pick up Tuesday [phone]

## 2011-02-18 NOTE — Telephone Encounter (Signed)
RX's placed at front desk for pickup.

## 2011-06-25 ENCOUNTER — Ambulatory Visit (INDEPENDENT_AMBULATORY_CARE_PROVIDER_SITE_OTHER): Payer: Medicare Other

## 2011-06-25 DIAGNOSIS — Z23 Encounter for immunization: Secondary | ICD-10-CM

## 2011-11-08 ENCOUNTER — Ambulatory Visit (INDEPENDENT_AMBULATORY_CARE_PROVIDER_SITE_OTHER)
Admission: RE | Admit: 2011-11-08 | Discharge: 2011-11-08 | Disposition: A | Discharge status: Home / Self Care | Payer: Medicare Other | Source: Ambulatory Visit | Attending: Internal Medicine | Admitting: Internal Medicine

## 2011-11-08 ENCOUNTER — Ambulatory Visit (INDEPENDENT_AMBULATORY_CARE_PROVIDER_SITE_OTHER): Payer: Medicare Other | Admitting: Internal Medicine

## 2011-11-08 ENCOUNTER — Encounter: Payer: Self-pay | Admitting: Internal Medicine

## 2011-11-08 VITALS — BP 140/76 | HR 97 | Temp 97.9°F | Wt 181.0 lb

## 2011-11-08 DIAGNOSIS — M542 Cervicalgia: Secondary | ICD-10-CM | POA: Diagnosis not present

## 2011-11-08 DIAGNOSIS — J019 Acute sinusitis, unspecified: Secondary | ICD-10-CM

## 2011-11-08 DIAGNOSIS — J40 Bronchitis, not specified as acute or chronic: Secondary | ICD-10-CM | POA: Diagnosis not present

## 2011-11-08 DIAGNOSIS — F17200 Nicotine dependence, unspecified, uncomplicated: Secondary | ICD-10-CM | POA: Diagnosis not present

## 2011-11-08 DIAGNOSIS — M5030 Other cervical disc degeneration, unspecified cervical region: Secondary | ICD-10-CM | POA: Diagnosis not present

## 2011-11-08 DIAGNOSIS — M25519 Pain in unspecified shoulder: Secondary | ICD-10-CM | POA: Diagnosis not present

## 2011-11-08 MED ORDER — GABAPENTIN 100 MG PO CAPS: ORAL_CAPSULE | ORAL | Status: DC

## 2011-11-08 MED ORDER — HYDROCODONE-HOMATROPINE 5-1.5 MG/5ML PO SYRP: 5.0000 mL | ORAL_SOLUTION | Freq: Four times a day (QID) | ORAL | Status: DC | PRN

## 2011-11-08 MED ORDER — AMOXICILLIN 500 MG PO CAPS: 500.0000 mg | ORAL_CAPSULE | Freq: Three times a day (TID) | ORAL | Status: DC

## 2011-11-08 MED ORDER — FLUTICASONE PROPIONATE 50 MCG/ACT NA SUSP: 1.0000 | Freq: Two times a day (BID) | NASAL | Status: DC | PRN

## 2011-11-08 NOTE — Progress Notes (Signed)
  Subjective:    Patient ID: Thomas Hudson, male    DOB: 1941-06-16, 71 y.o.   MRN: [id number]  HPI His illness began 3 days ago his head congestion followed shortly thereafter by upper chest congestion. He is unaware of any specific infectious exposures. Last night he began to cough up yellow sputum. TheraFlu and Mucinex have been a partial benefit He describes frontal headache, facial pain, and yellow nasal discharge. He's also had some pain in the mastoid areas; on the left this radiates into the neck. He denies fever, chills, or sweats.He's had no significant extrinsic symptoms such as itchy eyes or sneezing    Review of Systems For last month he's had some pain on left neck and trapezius area. This is not related to trigger or injury other than possibly sleeping wrong. This has impacted his posture while driving. This is described as intermittent aching,especially with certain positions. He is not treated this with any medications. It has been associated with some sensation of formication . He has had nostool or bowel incontinence.  He's concerned about his general health for his 2 sisters have developed cancer       Objective:   Physical Exam General appearance:good health ;well nourished; no acute distress or increased work of breathing is present.  No  lymphadenopathy about the head, neck, or axilla noted.   Eyes: No conjunctival inflammation or lid edema is present.  Ears:  External ear exam shows no significant lesions or deformities.  Otoscopic examination reveals clear canals, tympanic membranes are intact bilaterally without bulging, retraction, inflammation or discharge.  Nose:  External nasal examination shows no deformity or inflammation. Nasal mucosa are pink and moist without lesions or exudates. No septal dislocation or deviation.No obstruction to airflow.  Hyponasal speech pattern   Oral exam: Dental hygiene is good; lips and gums are healthy appearing.There is no  oropharyngeal erythema or exudate noted.   Neck:  No deformities, thyromegaly, masses, or tenderness noted. Left lateral rotation is associated with discomfort Heart:  Normal rate and regular rhythm. S1 and S2 normal without gallop,  click, rub or other extra sounds. Grade 1/6 systolic murmur   Lungs:Chest clear to auscultation; no wheezes, rhonchi,rales ,or rubs present.No increased work of breathing.  Intermittent  Paroxysmal coughing  Extremities:  No cyanosis, edema, or clubbing noted.There is some asymmetry of the posterior thoracic musculature suggesting occult scoliosis.    Neuro: deep tendon reflexes are equal and normal.Strength and tone are excellent    Skin: Warm & dry w/o jaundice          Assessment & Plan:   #1 rhinosinusitis   #2 bronchitis with paroxysmal coughing   #3 neck pain with possible cervical radiculopathy   Plan: see orders and recommendations

## 2011-11-08 NOTE — Patient Instructions (Signed)
Plain Mucinex for thick secretions ;force NON dairy fluids . Use a Neti pot daily as needed for sinus congestion; going from open side to congested side . Nasal cleansing in the shower as discussed. Make sure that all residual soap is removed to prevent irritation. Fluticasone 1 spray in each nostril twice a day as needed. Use the "crossover" technique as discussed. Plain Allegra 160 daily as needed for itchy eyes & sneezing.   Order for x-rays entered into  the computer; these will be performed at [address] [facility]. across from [facility]. No appointment is necessary. Please try to go on My Chart within the next 24 hours to allow me to release the results directly to you.

## 2011-11-15 ENCOUNTER — Encounter: Payer: Self-pay | Admitting: Family Medicine

## 2011-11-15 ENCOUNTER — Ambulatory Visit (INDEPENDENT_AMBULATORY_CARE_PROVIDER_SITE_OTHER): Payer: Medicare Other | Admitting: Family Medicine

## 2011-11-15 VITALS — BP 128/90 | HR 77 | Temp 98.3°F | Ht 66.5 in | Wt 180.0 lb

## 2011-11-15 DIAGNOSIS — J019 Acute sinusitis, unspecified: Secondary | ICD-10-CM

## 2011-11-15 DIAGNOSIS — J209 Acute bronchitis, unspecified: Secondary | ICD-10-CM | POA: Insufficient documentation

## 2011-11-15 DIAGNOSIS — J40 Bronchitis, not specified as acute or chronic: Secondary | ICD-10-CM

## 2011-11-15 MED ORDER — PREDNISONE 20 MG PO TABS: ORAL_TABLET | ORAL | Status: DC

## 2011-11-15 MED ORDER — DOXYCYCLINE HYCLATE 100 MG PO TABS: 100.0000 mg | ORAL_TABLET | Freq: Two times a day (BID) | ORAL | Status: AC

## 2011-11-15 MED ORDER — METHYLPREDNISOLONE ACETATE 80 MG/ML IJ SUSP
80.0000 mg | Freq: Once | INTRAMUSCULAR | Status: AC
  Administered 2011-11-15: 80 mg via INTRAMUSCULAR

## 2011-11-15 MED ORDER — ALBUTEROL SULFATE (5 MG/ML) 0.5% IN NEBU
2.5000 mg | INHALATION_SOLUTION | Freq: Once | RESPIRATORY_TRACT | Status: AC
  Administered 2011-11-15: 2.5 mg via RESPIRATORY_TRACT

## 2011-11-15 MED ORDER — HYDROCODONE-HOMATROPINE 5-1.5 MG/5ML PO SYRP: 5.0000 mL | ORAL_SOLUTION | Freq: Four times a day (QID) | ORAL | Status: DC | PRN

## 2011-11-15 MED ORDER — IPRATROPIUM BROMIDE 0.02 % IN SOLN
0.5000 mg | Freq: Once | RESPIRATORY_TRACT | Status: AC
  Administered 2011-11-15: 0.5 mg via RESPIRATORY_TRACT

## 2011-11-15 NOTE — Progress Notes (Signed)
  Subjective:    Patient ID: Thomas Hudson, male    DOB: 08-09-1940, 70 y.o.   MRN: [id number]  HPI URI- was seen on 5/3 and was started on Amox and cough syrup for bronchitis.  Pt reports this has worsened.  Last night developed chest tightness, wheezing, SOB.  Hx of wheezing.  No fevers.  Cough has been productive.  + facial pain/pressure.  L ear pain, swollen LN.  No known sick contacts.   Review of Systems For ROS see HPI     Objective:   Physical Exam  Vitals reviewed. Constitutional: He appears well-developed and well-nourished. No distress.  HENT:  Head: Normocephalic and atraumatic.  Right Ear: Tympanic membrane normal.  Left Ear: Tympanic membrane normal.  Nose: Mucosal edema and rhinorrhea present. Right sinus exhibits maxillary sinus tenderness and frontal sinus tenderness. Left sinus exhibits maxillary sinus tenderness and frontal sinus tenderness.  Mouth/Throat: Mucous membranes are normal. Oropharyngeal exudate and posterior oropharyngeal erythema present. No posterior oropharyngeal edema.       + PND  Eyes: Conjunctivae and EOM are normal. Pupils are equal, round, and reactive to light.  Neck: Normal range of motion. Neck supple.  Cardiovascular: Normal rate, regular rhythm and normal heart sounds.   Pulmonary/Chest: Effort normal. No respiratory distress. He has wheezes (diffuse expiratory wheezes). He has rales (coarse BS throughout).       + hacking cough  Lymphadenopathy:    He has no cervical adenopathy.  Skin: Skin is warm and dry.          Assessment & Plan:

## 2011-11-15 NOTE — Patient Instructions (Signed)
STOP the Amoxicillin START the Doxycycline twice daily (w/ food) for the sinuses and the lungs Start the Prednisone tomorrow morning- 2 pills at the same time (w/ breakfast) Use the Ventolin Inhaler- 2 puffs every 4 hrs as needed for chest tightness/wheezing Use the cough syrup as needed If you have worsening chest tightness/shortness of breath- please call or go to the ER Hang in there!!

## 2011-11-17 NOTE — Assessment & Plan Note (Signed)
New to provider.  Start doxy for broader coverage.  Reviewed supportive care and red flags that should prompt return.  Pt expressed understanding and is in agreement w/ plan.

## 2011-11-17 NOTE — Assessment & Plan Note (Signed)
New.  Pt w/ diffuse wheezing.  Increase abx coverage w/ Doxy.  Add prednisone for improved air movement.  Albuterol HFA prn.  Reviewed supportive care and red flags that should prompt return.  Pt expressed understanding and is in agreement w/ plan.

## 2011-11-26 ENCOUNTER — Ambulatory Visit (INDEPENDENT_AMBULATORY_CARE_PROVIDER_SITE_OTHER): Payer: Medicare Other | Admitting: Internal Medicine

## 2011-11-26 VITALS — BP 132/80 | HR 77 | Temp 98.4°F | Wt 182.0 lb

## 2011-11-26 DIAGNOSIS — J209 Acute bronchitis, unspecified: Secondary | ICD-10-CM | POA: Diagnosis not present

## 2011-11-26 DIAGNOSIS — J40 Bronchitis, not specified as acute or chronic: Secondary | ICD-10-CM

## 2011-11-26 MED ORDER — HYDROCODONE-HOMATROPINE 5-1.5 MG/5ML PO SYRP: 5.0000 mL | ORAL_SOLUTION | Freq: Four times a day (QID) | ORAL | Status: AC | PRN

## 2011-11-26 MED ORDER — PREDNISONE 10 MG PO TABS: ORAL_TABLET | ORAL | Status: DC

## 2011-11-26 NOTE — Assessment & Plan Note (Addendum)
Status post eval 11/06/2011 with cough, was prescribed amoxicillin and Advair. Chest x-ray showed no acute symptoms. Was seen in followup 11/17/2011, noted to be quite congested and wheezy. Prescribe doxycycline, prednisone. Comes here today not feeling better, afebrile, no GERD symptoms, no chest pain. He has not been taking Advair. Plan: We'll provide a second round of steroids, CBG today is 91 Change Advair to symbicort No more antibiotics for now See instructions  Come back in one week.

## 2011-11-26 NOTE — Progress Notes (Signed)
  Subjective:    Patient ID: Thomas Hudson, male    DOB: 06/01/1941, 71 y.o.   MRN: [id number]  HPI Acute visit, not getting better. was seen 11/06/2011 with cough , seen again 11/17/2011 with wheezing. Chest x-ray was no acute, status post amoxicillin, doxycycline, prednisone. He uses Advair temporarily and now is using Ventolin.  Past medical history Hypertension Hyperlipidemia BPH.   Review of Systems Current symptoms are cough which is dry, wheezing which is not better. Still has some sinus congestion without discharge. Denies chest pain, shortness of breath only with cough spells. No fever, no GERD type of symptoms. He denies any history of asthma or emphysema although he quit tobacco in 1999, he was a heavy smoker.     Objective:   Physical Exam  General -- alert, well-developed. No apparent distress.  HEENT -- TMs normal, throat w/o redness, face symmetric and not tender to palpation, nose not congested  Lungs -- normal respiratory effort, no intercostal retractions, no accessory muscle use: Diffuse wheezing, few rhonchi, no crackles. Heart-- normal rate, regular rhythm, no murmur, and no gallop.   Extremities-- no pretibial edema bilaterally  Neurologic-- alert & oriented X3 and strength normal in all extremities. Psych-- Cognition and judgment appear intact. Alert and cooperative with normal attention span and concentration.  not anxious appearing and not depressed appearing.      Assessment & Plan:

## 2011-11-26 NOTE — Patient Instructions (Signed)
Symbicort twice a day every day. Ventolin in as needed for cough and wheezing Prednisone as prescribed Mucinex DM twice a day until better If the cough continue, is okay to take the syrup  as needed. Please come back in one week for checkup. Call anytime if you feel worse.

## 2011-11-27 ENCOUNTER — Encounter: Payer: Self-pay | Admitting: Internal Medicine

## 2011-12-05 ENCOUNTER — Ambulatory Visit: Payer: Medicare Other | Admitting: Internal Medicine

## 2011-12-05 ENCOUNTER — Encounter: Payer: Self-pay | Admitting: Internal Medicine

## 2011-12-05 ENCOUNTER — Ambulatory Visit (INDEPENDENT_AMBULATORY_CARE_PROVIDER_SITE_OTHER): Payer: Medicare Other | Admitting: Internal Medicine

## 2011-12-05 VITALS — BP 138/84 | HR 89 | Temp 98.5°F | Wt 176.0 lb

## 2011-12-05 DIAGNOSIS — J45909 Unspecified asthma, uncomplicated: Secondary | ICD-10-CM | POA: Diagnosis not present

## 2011-12-05 DIAGNOSIS — J019 Acute sinusitis, unspecified: Secondary | ICD-10-CM

## 2011-12-05 MED ORDER — CEFUROXIME AXETIL 500 MG PO TABS: 500.0000 mg | ORAL_TABLET | Freq: Two times a day (BID) | ORAL | Status: AC

## 2011-12-05 NOTE — Progress Notes (Signed)
  Subjective:    Patient ID: Thomas Hudson, male    DOB: [birth year], 71 y.o.   MRN: [id number]  HPI Wheezing and cough have persisted despite having been seen on several occasions in the last month. He has been using albuterol rescue inhaler every 4 hours as needed as well as the maintenance Symbicort 2 puffs twice a day. In 2010 he was diagnosed as having reactive airways disease; he denies any personal history of asthma per se.  He's had thick brown nasal discharge over the last month. The last 24+ hours he describes a frontal headache and facial pain. He's had some pressure in his temples but denies earache or otic discharge   He quit smoking in 1999.  His sister does have COPD and lung cancer in the context smoking    Review of Systems  He denies significant extrinsic symptoms of itchy eyes or sneezing. He does not have significant reflux symptoms. He is not on ACE inhibitor     Objective:   Physical Exam General appearance:good health ;well nourished; no acute distress or increased work of breathing is present.  No  lymphadenopathy about the head, neck, or axilla noted.   Eyes: No conjunctival inflammation or lid edema is present. .  Ears:  External ear exam shows no significant lesions or deformities.  Otoscopic examination reveals clear canals, tympanic membranes are intact bilaterally without bulging, retraction, inflammation or discharge.  Nose:  External nasal examination shows no deformity or inflammation. Nasal mucosa are pink and moist without lesions or exudates. No septal dislocation or deviation.No obstruction to airflow.   Oral exam: Dental hygiene is good; lips and gums are healthy appearing.There is no oropharyngeal erythema or exudate noted.     Heart:  Normal rate and regular rhythm. S1 and S2 normal without gallop, murmur, click, rub . S4.   Lungs: He exhibits mild rales at the bases and nonproductive cough without significant wheezes, rhonchi,rales ,or rubs  present.No increased work of breathing.    Extremities:  No cyanosis, edema, or clubbing  noted    Skin: Warm & dry w/o jaundice or tenting.          Assessment & Plan:  #1 persistent asthmatic bronchitis  #2 purulent nasal discharge; rule out chronic sinusitis as etiology of #1  Plan: See orders and recommendations

## 2011-12-05 NOTE — Patient Instructions (Signed)
Plain Mucinex for thick secretions ;force NON dairy fluids . Use a Neti pot daily as needed for sinus congestion; going from open side to congested side . Nasal cleansing in the shower as discussed. Make sure that all residual soap is removed to prevent irritation.  Plain Allegra 160 daily as needed for itchy eyes & sneezing.    

## 2011-12-09 ENCOUNTER — Ambulatory Visit (INDEPENDENT_AMBULATORY_CARE_PROVIDER_SITE_OTHER)
Admission: RE | Admit: 2011-12-09 | Discharge: 2011-12-09 | Disposition: A | Discharge status: Home / Self Care | Payer: Medicare Other | Source: Ambulatory Visit | Attending: Internal Medicine | Admitting: Internal Medicine

## 2011-12-09 DIAGNOSIS — J019 Acute sinusitis, unspecified: Secondary | ICD-10-CM | POA: Diagnosis not present

## 2011-12-09 DIAGNOSIS — J3489 Other specified disorders of nose and nasal sinuses: Secondary | ICD-10-CM | POA: Diagnosis not present

## 2011-12-11 ENCOUNTER — Other Ambulatory Visit: Payer: Self-pay | Admitting: *Deleted

## 2011-12-11 DIAGNOSIS — J329 Chronic sinusitis, unspecified: Secondary | ICD-10-CM

## 2011-12-19 DIAGNOSIS — J328 Other chronic sinusitis: Secondary | ICD-10-CM | POA: Diagnosis not present

## 2012-01-06 ENCOUNTER — Telehealth: Payer: Self-pay | Admitting: Internal Medicine

## 2012-01-06 NOTE — Telephone Encounter (Signed)
Pt would like printed prescriptions for the following medications: Cozaar 100mg   Pravachol 40mg  Zytrec 10mg  Prilosec 20mg   Qty 90 and 3 refills

## 2012-01-07 MED ORDER — LOSARTAN POTASSIUM 100 MG PO TABS: 100.0000 mg | ORAL_TABLET | Freq: Every day | ORAL | Status: DC

## 2012-01-07 MED ORDER — CETIRIZINE HCL 10 MG PO TABS: 10.0000 mg | ORAL_TABLET | Freq: Every day | ORAL | Status: DC

## 2012-01-07 MED ORDER — PRAVASTATIN SODIUM 40 MG PO TABS: 40.0000 mg | ORAL_TABLET | Freq: Every day | ORAL | Status: DC

## 2012-01-07 MED ORDER — FINASTERIDE 5 MG PO TABS: 5.0000 mg | ORAL_TABLET | Freq: Every day | ORAL | Status: DC

## 2012-01-07 NOTE — Telephone Encounter (Signed)
Rxs sent

## 2012-01-10 MED ORDER — OMEPRAZOLE 20 MG PO CPDR: 20.0000 mg | DELAYED_RELEASE_CAPSULE | Freq: Every day | ORAL | Status: DC

## 2012-01-10 MED ORDER — PRAVASTATIN SODIUM 40 MG PO TABS: 40.0000 mg | ORAL_TABLET | Freq: Every day | ORAL | Status: DC

## 2012-01-10 MED ORDER — LOSARTAN POTASSIUM 100 MG PO TABS: 100.0000 mg | ORAL_TABLET | Freq: Every day | ORAL | Status: DC

## 2012-01-10 MED ORDER — CETIRIZINE HCL 10 MG PO TABS: 10.0000 mg | ORAL_TABLET | Freq: Every day | ORAL | Status: DC

## 2012-01-10 NOTE — Telephone Encounter (Signed)
Rx printed and ready for Pt to pick up.

## 2012-01-10 NOTE — Addendum Note (Signed)
Addended by: Maurice Small on: 01/10/2012 11:59 AM   Modules accepted: Orders

## 2012-01-10 NOTE — Telephone Encounter (Signed)
Pt had CPE w/labs scheduled for 03-12-12.

## 2012-01-10 NOTE — Addendum Note (Signed)
Addended by: Candie Echevaria L on: 01/10/2012 09:35 AM   Modules accepted: Orders

## 2012-01-13 DIAGNOSIS — J328 Other chronic sinusitis: Secondary | ICD-10-CM | POA: Diagnosis not present

## 2012-01-14 ENCOUNTER — Other Ambulatory Visit: Payer: Medicare Other

## 2012-01-30 DIAGNOSIS — D4959 Neoplasm of unspecified behavior of other genitourinary organ: Secondary | ICD-10-CM | POA: Diagnosis not present

## 2012-02-05 DIAGNOSIS — N400 Enlarged prostate without lower urinary tract symptoms: Secondary | ICD-10-CM | POA: Diagnosis not present

## 2012-02-27 ENCOUNTER — Ambulatory Visit (INDEPENDENT_AMBULATORY_CARE_PROVIDER_SITE_OTHER): Payer: Medicare Other | Admitting: Internal Medicine

## 2012-02-27 ENCOUNTER — Encounter: Payer: Self-pay | Admitting: Internal Medicine

## 2012-02-27 VITALS — BP 148/72 | HR 71 | Temp 98.4°F | Resp 12 | Ht 67.03 in | Wt 180.2 lb

## 2012-02-27 DIAGNOSIS — R7309 Other abnormal glucose: Secondary | ICD-10-CM | POA: Diagnosis not present

## 2012-02-27 DIAGNOSIS — I10 Essential (primary) hypertension: Secondary | ICD-10-CM

## 2012-02-27 DIAGNOSIS — K219 Gastro-esophageal reflux disease without esophagitis: Secondary | ICD-10-CM | POA: Diagnosis not present

## 2012-02-27 DIAGNOSIS — Z0000 Encounter for general adult medical examination without abnormal findings: Secondary | ICD-10-CM | POA: Diagnosis not present

## 2012-02-27 DIAGNOSIS — E785 Hyperlipidemia, unspecified: Secondary | ICD-10-CM | POA: Diagnosis not present

## 2012-02-27 LAB — BASIC METABOLIC PANEL
BUN: 13 mg/dL (ref 6–23)
CO2: 25 mEq/L (ref 19–32)
Calcium: 8.5 mg/dL (ref 8.4–10.5)
Chloride: 108 mEq/L (ref 96–112)
Creatinine, Ser: 0.9 mg/dL (ref 0.4–1.5)
GFR: 87.76 mL/min (ref >60.00)
Glucose, Bld: 93 mg/dL (ref 70–99)
Potassium: 3.9 mEq/L (ref 3.5–5.1)
Sodium: 137 mEq/L (ref 135–145)

## 2012-02-27 LAB — CBC WITH DIFFERENTIAL/PLATELET
Basophils Absolute: 0 10*3/uL (ref 0.0–0.1)
Eosinophils Absolute: 0.1 10*3/uL (ref 0.0–0.7)
Lymphs Abs: 1 10*3/uL (ref 0.7–4.0)
MCHC: 31.1 g/dL (ref 30.0–36.0)
MCV: 67.5 fl — ABNORMAL LOW (ref 78.0–100.0)
Monocytes Absolute: 0.5 10*3/uL (ref 0.1–1.0)
Neutrophils Relative %: 65.7 % (ref 43.0–77.0)
Platelets: 319 10*3/uL (ref 150.0–400.0)
RDW: 17.5 % — ABNORMAL HIGH (ref 11.5–14.6)
WBC: 4.7 10*3/uL (ref 4.5–10.5)

## 2012-02-27 LAB — HEMOGLOBIN A1C: Hgb A1c MFr Bld: 6 % (ref 4.6–6.5)

## 2012-02-27 LAB — HEPATIC FUNCTION PANEL
Alkaline Phosphatase: 52 U/L (ref 39–117)
Bilirubin, Direct: 0.1 mg/dL (ref 0.0–0.3)
Total Protein: 6.8 g/dL (ref 6.0–8.3)

## 2012-02-27 LAB — LIPID PANEL
Cholesterol: 154 mg/dL (ref 0–200)
LDL Cholesterol: 97 mg/dL (ref 0–99)

## 2012-02-27 NOTE — Progress Notes (Signed)
Subjective:    Patient ID: Thomas Hudson, male    DOB: 23-Oct-1940, 71 y.o.   MRN: [id number]  HPI Medicare Wellness Visit:  The following psychosocial & medical history were reviewed as required by Medicare.   Social history: caffeine: 1 cup coffee/ day , alcohol:  no ,  tobacco use : quit 1999  & exercise : minimal.   Home & personal  safety / fall risk: no issues, activities of daily living: no limitations , seatbelt use : yes , and smoke alarm employment : yes .  Power of Attorney/Living Will status : NO  Vision ( as recorded per Nurse) & Hearing  evaluation : Ophth exam 2011; no hearing evaluation. Orientation :oriented X 3 , memory & recall : good,  math testing: good,and mood & affect : normal . Depression / anxiety: denied Travel history : [place] 2009 , immunization status :Shingles needed , transfusion history:  no, and preventive health surveillance ( colonoscopies, BMD , etc as per protocol/ [facility]): colonoscopy up to date, Dental care: every 6 mos . Chart reviewed &  Updated. Active issues reviewed & addressed.       Review of Systems HYPERTENSION: Disease Monitoring: Blood pressure range-not monitored  Chest pain, palpitations- no       Dyspnea- no Medications: Compliance- yes Lightheadedness,Syncope- no    Edema- no  FASTING HYPERGLYCEMIA, PMH of: Polyuria/phagia/dipsia- no       Visual problems- no  HYPERLIPIDEMIA: Disease Monitoring: See symptoms for Hypertension Medications: Compliance- yes  Abd pain, bowel changes- no   Muscle aches- no        Objective:   Physical Exam Gen.: Healthy and well-nourished in appearance. Alert, appropriate and cooperative throughout exam. Head: Normocephalic without obvious abnormalities  Eyes: No corneal or conjunctival inflammation noted. Pupils equal round reactive to light and accommodation. Ptosis bilaterally. Extraocular motion intact. Vision grossly normal with lenses. Ears: External  ear exam reveals no significant  lesions or deformities. Canals clear .TMs normal. Hearing is grossly decreased  bilaterally . Nose: External nasal exam reveals no deformity or inflammation. Nasal mucosa are pink and moist. No lesions or exudates noted.  Mouth: Oral mucosa and oropharynx reveal no lesions or exudates. Teeth in good repair. Neck: No deformities, masses, or tenderness noted. Range of motion & Thyroid normal. Lungs: Normal respiratory effort; chest expands symmetrically. Lungs are clear to auscultation without rales, wheezes, or increased work of breathing. Heart: Normal rate and rhythm. Normal S1 and S2. No gallop, click, or rub.S4 w/o murmur. Abdomen: Bowel sounds normal; abdomen soft and nontender. No masses, organomegaly or hernias noted. Genitalia: Dr Mena Goes                                               Musculoskeletal/extremities: No deformity or scoliosis noted of  the thoracic or lumbar spine. No clubbing, cyanosis, edema, or deformity noted. Range of motion  normal .Tone & strength  normal.Joints normal. Nail health  good. Vascular: Carotid, radial artery, dorsalis pedis and  posterior tibial pulses are full and equal. No bruits present. Neurologic: Alert and oriented x3. Deep tendon reflexes symmetrical and normal.          Skin: Intact without suspicious lesions or rashes. Lymph: No cervical, axillary lymphadenopathy present. Psych: Mood and affect are normal. Normally interactive  Assessment & Plan:  #1 Medicare Wellness Exam; criteria met ; data entered #2 Problem List reviewed ; Assessment/ Recommendations made Plan: see Orders

## 2012-02-27 NOTE — Patient Instructions (Addendum)
Preventive Health Care: Exercise at least 30-45 minutes a day,  3-4 days a week.  Eat a low-fat diet with lots of fruits and vegetables, up to 7-9 servings per day.  Consume less than 40 grams (preferably ZERO) of sugar per day from foods & drinks with High Fructose Corn Sugar as #1,2,3 or # 4 on label. Eye Doctor - have an eye exam @ least annually.                                                         Health Care Power of Attorney & Living Will. Complete if not in place ; these place you in charge of your health care decisions.  To prevent palpitations or premature beats, avoid stimulants such as decongestants, diet pills, nicotine, or caffeine (coffee, tea, cola, or chocolate) to excess.  Take the EKG to any emergency room or preop visits. There are minor ,nonspecific changes; as long as there is no new change these are not clinically significant.   If you activate My Chart; the results can be released to you as soon as they populate from the lab. If you choose not to use this program; the labs have to be reviewed, copied & mailed   causing a delay in getting the results to you.

## 2012-03-12 ENCOUNTER — Encounter: Payer: Medicare Other | Admitting: Internal Medicine

## 2012-04-13 ENCOUNTER — Telehealth: Payer: Self-pay | Admitting: Internal Medicine

## 2012-04-13 NOTE — Telephone Encounter (Signed)
Caller: Romell/Patient; Patient Name: Thomas Hudson; PCP: Marga Melnick; Best Callback Phone Number: [phone] needing Prescriptions of all meds to take to [facility] where he gets them filled. He will run out of meds this Friday 04/17/12.  He is scheduled to come in for flu shot on 04/14/12 @ 1400  and wondered if he can pick up [facility]. Please ask Dr. Alwyn Ren. Takes Losartan, Pevastatin, Ceterizine and Omeprazole.  Medication Questions Protocol.

## 2012-04-14 MED ORDER — CETIRIZINE HCL 10 MG PO TABS: 10.0000 mg | ORAL_TABLET | Freq: Every day | ORAL | Status: DC

## 2012-04-14 MED ORDER — LOSARTAN POTASSIUM 100 MG PO TABS: 100.0000 mg | ORAL_TABLET | Freq: Every day | ORAL | Status: DC

## 2012-04-14 MED ORDER — PRAVASTATIN SODIUM 40 MG PO TABS: 40.0000 mg | ORAL_TABLET | Freq: Every day | ORAL | Status: DC

## 2012-04-14 MED ORDER — OMEPRAZOLE 20 MG PO CPDR: 20.0000 mg | DELAYED_RELEASE_CAPSULE | Freq: Every day | ORAL | Status: DC

## 2012-04-14 NOTE — Telephone Encounter (Signed)
Refill done.  

## 2012-04-15 ENCOUNTER — Ambulatory Visit (INDEPENDENT_AMBULATORY_CARE_PROVIDER_SITE_OTHER): Payer: Medicare Other

## 2012-04-15 DIAGNOSIS — Z23 Encounter for immunization: Secondary | ICD-10-CM | POA: Diagnosis not present

## 2012-07-10 ENCOUNTER — Ambulatory Visit (INDEPENDENT_AMBULATORY_CARE_PROVIDER_SITE_OTHER): Payer: Medicare Other | Admitting: Internal Medicine

## 2012-07-10 ENCOUNTER — Encounter: Payer: Self-pay | Admitting: Internal Medicine

## 2012-07-10 ENCOUNTER — Ambulatory Visit (INDEPENDENT_AMBULATORY_CARE_PROVIDER_SITE_OTHER)
Admission: RE | Admit: 2012-07-10 | Discharge: 2012-07-10 | Disposition: A | Discharge status: Home / Self Care | Payer: Medicare Other | Source: Ambulatory Visit | Attending: Internal Medicine | Admitting: Internal Medicine

## 2012-07-10 VITALS — BP 128/66 | HR 97 | Temp 98.4°F | Wt 185.0 lb

## 2012-07-10 DIAGNOSIS — D539 Nutritional anemia, unspecified: Secondary | ICD-10-CM | POA: Diagnosis not present

## 2012-07-10 DIAGNOSIS — I739 Peripheral vascular disease, unspecified: Secondary | ICD-10-CM

## 2012-07-10 DIAGNOSIS — R0989 Other specified symptoms and signs involving the circulatory and respiratory systems: Secondary | ICD-10-CM | POA: Diagnosis not present

## 2012-07-10 DIAGNOSIS — J45909 Unspecified asthma, uncomplicated: Secondary | ICD-10-CM | POA: Diagnosis not present

## 2012-07-10 DIAGNOSIS — R0609 Other forms of dyspnea: Secondary | ICD-10-CM

## 2012-07-10 LAB — CBC WITH DIFFERENTIAL/PLATELET
MCHC: 30 g/dL (ref 30.0–36.0)
MCV: 59.2 fl — ABNORMAL LOW (ref 78.0–100.0)
RBC: 3.48 Mil/uL — ABNORMAL LOW (ref 4.22–5.81)

## 2012-07-10 LAB — VITAMIN B12: Vitamin B-12: 173 pg/mL — ABNORMAL LOW (ref 211–911)

## 2012-07-10 NOTE — Patient Instructions (Addendum)
Order for x-rays entered into  the computer; these will be performed at [address] [facility]. across from [facility]. No appointment is necessary. eeee If you activate My Chart; the results can be released to you as soon as they populate from the lab. If you choose not to use this program; the labs have to be reviewed, copied & mailed   causing a delay in getting the results to you. Please complete & return stool cards

## 2012-07-10 NOTE — Progress Notes (Signed)
  Subjective:    Patient ID: Thomas Hudson, male    DOB: [birth year], 72 y.o.   MRN: [id number]  HPI  DYSPNEA Onset:2 months ago Context/character:with any exertion @ 50 yards  Severity/limitation:prevents exercise; he has become sedentary Modifying factors: exercise; excess dust or pollen Associated signs/symptoms: claudication @ 50 yards w/o associated chest pain or palpitations Treatment/Response:albuterol helped some  Chest x-ray done in May 2013 revealed no active disease.  His hematocrit was 26.8 on 02/27/12. Repeat CBC with iron panel & B12 had been recommended along with completion of stool cards. These have not been completed to date (copies of labs given to him). HCT was 28.8 in 2010   Past medical history: RAD with pollen & dust; no PMH asthma Smoking history:50+ pack years Family history:sister lung cancer.        Review of Systems Constitutional: No fever, chills, significant weight change. Frequent early awakening. Some night sweats & fatigue Respiratory: Some cough with PNDrainage.White sputum production w/o hemoptysis, pleurisy GI: No reflux. Occasional dysphagia. No melena or rectal bleeding Endocrine: No skin/hair/nail changes Neuro/Muscular: numbness in arms & hands upon awakening. Heme/lymph: No abnormal bruising, lymphadenopathy Allergy/immunologic: No rhinoconjunctivitis, angioedema       Objective:   Physical Exam General appearance:good health ;well nourished; no acute distress or increased work of breathing is present.  No  lymphadenopathy about the head, neck, or axilla noted.   Eyes: No conjunctival inflammation or lid edema is present. Conjunctival pallor.  Ears:  External ear exam shows no significant lesions or deformities.  Otoscopic examination reveals clear canals, tympanic membranes are intact bilaterally without bulging, retraction, inflammation or discharge.  Nose:  External nasal examination shows no deformity or inflammation. Nasal mucosa  are pink and moist without lesions or exudates. No septal dislocation or deviation.No obstruction to airflow.   Oral exam: Dental hygiene is good; lips and gums are healthy appearing.There is no oropharyngeal erythema or exudate noted.   Neck:  No deformities, masses, or tenderness noted.      Heart:  Normal rate and regular rhythm. S1 and S2 normal without gallop,  click, rub or other extra sounds. Grade 1/6 systolic murmur with neck radiation  Lungs:Chest clear to auscultation; no wheezes, rhonchi,rales ,or rubs present.No increased work of breathing.    Extremities:  No cyanosis, edema, or clubbing  noted    All pulses intact  .No ischemic skin changes.     Skin: Warm & dry w/o jaundice or tenting.          Assessment & Plan:  #1DOE #2 claudication  #3 anemia Plan: See orders and recommendations

## 2012-07-13 ENCOUNTER — Telehealth: Payer: Self-pay | Admitting: *Deleted

## 2012-07-13 ENCOUNTER — Ambulatory Visit: Payer: Medicare Other

## 2012-07-13 ENCOUNTER — Other Ambulatory Visit: Payer: Self-pay | Admitting: Internal Medicine

## 2012-07-13 DIAGNOSIS — D519 Vitamin B12 deficiency anemia, unspecified: Secondary | ICD-10-CM

## 2012-07-13 DIAGNOSIS — E538 Deficiency of other specified B group vitamins: Secondary | ICD-10-CM

## 2012-07-13 DIAGNOSIS — D509 Iron deficiency anemia, unspecified: Secondary | ICD-10-CM

## 2012-07-13 MED ORDER — CYANOCOBALAMIN 1000 MCG/ML IJ SOLN: 1000.0000 ug | Freq: Once | INTRAMUSCULAR | Status: DC

## 2012-07-13 MED ORDER — POLYSACCHARIDE IRON COMPLEX 150 MG PO CAPS: 150.0000 mg | ORAL_CAPSULE | Freq: Two times a day (BID) | ORAL | Status: DC

## 2012-07-13 NOTE — Telephone Encounter (Signed)
Patient contacted and made aware that Rx for Iron is available at pharmacy. Advised wife to increase fiber in diet to avoid constipation while taking iron if needed.

## 2012-07-16 ENCOUNTER — Other Ambulatory Visit (INDEPENDENT_AMBULATORY_CARE_PROVIDER_SITE_OTHER): Payer: Medicare Other

## 2012-07-16 DIAGNOSIS — Z0000 Encounter for general adult medical examination without abnormal findings: Secondary | ICD-10-CM | POA: Diagnosis not present

## 2012-07-16 DIAGNOSIS — Z1289 Encounter for screening for malignant neoplasm of other sites: Secondary | ICD-10-CM | POA: Diagnosis not present

## 2012-07-20 ENCOUNTER — Ambulatory Visit (INDEPENDENT_AMBULATORY_CARE_PROVIDER_SITE_OTHER): Payer: Medicare Other

## 2012-07-20 DIAGNOSIS — D518 Other vitamin B12 deficiency anemias: Secondary | ICD-10-CM

## 2012-07-20 DIAGNOSIS — D519 Vitamin B12 deficiency anemia, unspecified: Secondary | ICD-10-CM

## 2012-07-20 MED ORDER — CYANOCOBALAMIN 1000 MCG/ML IJ SOLN
1000.0000 ug | Freq: Once | INTRAMUSCULAR | Status: AC
  Administered 2012-07-20: 1000 ug via INTRAMUSCULAR

## 2012-07-24 ENCOUNTER — Encounter: Payer: Self-pay | Admitting: Internal Medicine

## 2012-07-24 ENCOUNTER — Ambulatory Visit (INDEPENDENT_AMBULATORY_CARE_PROVIDER_SITE_OTHER): Payer: Medicare Other | Admitting: Internal Medicine

## 2012-07-24 VITALS — BP 100/56 | HR 92 | Ht 67.25 in | Wt 184.4 lb

## 2012-07-24 DIAGNOSIS — E538 Deficiency of other specified B group vitamins: Secondary | ICD-10-CM | POA: Diagnosis not present

## 2012-07-24 DIAGNOSIS — Z860100 Personal history of colon polyps, unspecified: Secondary | ICD-10-CM

## 2012-07-24 DIAGNOSIS — D509 Iron deficiency anemia, unspecified: Secondary | ICD-10-CM

## 2012-07-24 NOTE — Progress Notes (Signed)
HISTORY OF PRESENT ILLNESS:  Thomas Hudson is a 72 y.o. 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 The patient had been worked up earlier that year for iron deficiency anemia at which time the ampullary adenoma was discovered. As well, nonbleeding small bowel AVMs. The patient is sent today regarding iron deficiency anemia. Review of outside records reveals a hemoglobin in February 2012 measuring 14.8. Hemoglobin August 2013 measuring 8.3. Most recently, with complaints of significant exertional dyspnea and associated myalgias/arthralgias, a hemoglobin of 6.3 on 07/10/2012. Indices were microcytic. Evidence for iron deficiency as well as B12 deficiency identified. Iron saturation of 13% and B12 levels 173 respectively. Hemoccult testing of the stools was negative. He was placed on iron twice daily and B12 replacement therapy 2 weeks ago. No blood transfusion. He is sent for evaluation. Patient tells me that he feels slightly better since 2 weeks ago. Still with exertional shortness of breath. He is now interested in blood transfusion at this time. In January 2011 complete colonoscopy with ileal intubation revealed sigmoid diverticulosis and internal hemorrhoids. Upper endoscopy at that time revealing the ampullary lesion only. His GI review of systems negative. Specifically, no melena or hematochezia. He has not been a recent blood donor.   REVIEW OF SYSTEMS:  All non-GI ROS negative except for fatigue and shortness of breath  Past Medical History  Diagnosis Date  . GERD (gastroesophageal reflux disease)   . Hyperlipidemia   . Hypertension   . BPH (benign prostatic hyperplasia)   . RAD (reactive airway disease)     no PMH of asthma; RAD post infection    Past Surgical History  Procedure Date  . Colonoscopy w/  polypectomy 2009    X 2; Dr.Perry   . Vasectomy   . Upper gastrointestinal endoscopy      gastric polyp;Dr.Perry   . Lumbar laminectomy 07/2008  . Whipple procedure 2012    partial ; [facility]  . Hernia repair     Social History Thomas Hudson  reports that he quit smoking about 15 years ago. His smoking use included Cigarettes. He has never used smokeless tobacco. He reports that he does not drink alcohol or use illicit drugs.  family history includes COPD in his sister; Colon cancer in his paternal grandfather; Coronary artery disease in his brother; Heart attack in his maternal uncle; Heart disease in his maternal grandfather and paternal grandfather; Lung cancer in his sister; Ovarian cancer in his sister; Prostate cancer in his paternal uncle; Skin cancer in his mother; Stroke (age of onset:58) in his paternal grandmother; Throat cancer in his brother, father, and sister; and Uterine cancer in his sister.  There is no history of Asthma.  Allergies  Allergen Reactions  . Morphine And Related Itching       PHYSICAL EXAMINATION: Vital signs: BP 100/56  Pulse 92  Ht 5' 7.25" (1.708 m)  Wt 184 lb 6.4 oz (83.643 kg)  BMI 28.67 kg/m2  Constitutional: Obviously pale, generally well-appearing, no acute distress. Some shortness of breath with movement around the room Psychiatric: alert and oriented x3, cooperative Eyes: extraocular movements intact, anicteric, conjunctiva pale Mouth: oral pharynx moist, no lesions Neck: supple no lymphadenopathy Cardiovascular: heart regular rate and rhythm, no murmur Lungs: clear to auscultation bilaterally Abdomen: soft, nontender, nondistended, no obvious ascites, no peritoneal signs, normal bowel sounds, no organomegaly Rectal: Omitted. Recent stools Hemoccult negative Extremities:  no lower extremity edema bilaterally Skin: no lesions on visible extremities Neuro: No focal deficits.   ASSESSMENT:  #1. Anemia. Evidence for iron deficiency and  B12 deficiency. Possible etiologies for iron deficiency include previously identified AVMs and/or altered upper GI anatomy. B12 deficiency may be due to altered anatomy or other usual causes. No obvious gross GI bleeding. Negative occult bleeding studies #2. Interval are adenoma. Surgical therapy at [facility] as described above.   PLAN:  #1. Increase iron 3 times daily #2. Concurrent vitamin C #3. Continue with B12 replacement #4. He plans on repeat blood counts with Dr. Alwyn Ren in 2 weeks #5. Recommended blood transfusion given his ongoing shortness of breath. He declines will contact the office should be for worse #6. Diagnostic upper endoscopy.The nature of the procedure, as well as the risks, benefits, and alternatives were carefully and thoroughly reviewed with the patient. Ample time for discussion and questions allowed. The patient understood, was satisfied, and agreed to proceed.  #7. Surveillance colonoscopy around January 2016 for routine surveillance

## 2012-07-24 NOTE — Patient Instructions (Addendum)
You have been scheduled for an endoscopy with propofol. Please follow written instructions given to you at your visit today. If you use inhalers (even only as needed) or a CPAP machine, please bring them with you on the day of your procedure. 

## 2012-07-27 ENCOUNTER — Ambulatory Visit (INDEPENDENT_AMBULATORY_CARE_PROVIDER_SITE_OTHER): Payer: Medicare Other | Admitting: *Deleted

## 2012-07-27 DIAGNOSIS — E538 Deficiency of other specified B group vitamins: Secondary | ICD-10-CM

## 2012-07-27 MED ORDER — CYANOCOBALAMIN 1000 MCG/ML IJ SOLN
1000.0000 ug | Freq: Once | INTRAMUSCULAR | Status: AC
  Administered 2012-07-27: 1000 ug via INTRAMUSCULAR

## 2012-08-03 ENCOUNTER — Ambulatory Visit (INDEPENDENT_AMBULATORY_CARE_PROVIDER_SITE_OTHER): Payer: Medicare Other | Admitting: *Deleted

## 2012-08-03 ENCOUNTER — Ambulatory Visit: Payer: Medicare Other

## 2012-08-03 DIAGNOSIS — E538 Deficiency of other specified B group vitamins: Secondary | ICD-10-CM | POA: Diagnosis not present

## 2012-08-03 MED ORDER — CYANOCOBALAMIN 1000 MCG/ML IJ SOLN
1000.0000 ug | Freq: Once | INTRAMUSCULAR | Status: AC
  Administered 2012-08-03: 1000 ug via INTRAMUSCULAR

## 2012-08-04 ENCOUNTER — Encounter: Payer: Self-pay | Admitting: Internal Medicine

## 2012-08-04 ENCOUNTER — Ambulatory Visit (AMBULATORY_SURGERY_CENTER): Payer: Medicare Other | Admitting: Internal Medicine

## 2012-08-04 VITALS — BP 130/76 | HR 69 | Temp 97.3°F | Resp 13 | Ht 67.25 in | Wt 184.0 lb

## 2012-08-04 DIAGNOSIS — I10 Essential (primary) hypertension: Secondary | ICD-10-CM | POA: Diagnosis not present

## 2012-08-04 DIAGNOSIS — K2900 Acute gastritis without bleeding: Secondary | ICD-10-CM

## 2012-08-04 DIAGNOSIS — K2990 Gastroduodenitis, unspecified, without bleeding: Secondary | ICD-10-CM | POA: Diagnosis not present

## 2012-08-04 DIAGNOSIS — D509 Iron deficiency anemia, unspecified: Secondary | ICD-10-CM

## 2012-08-04 DIAGNOSIS — K2970 Gastritis, unspecified, without bleeding: Secondary | ICD-10-CM

## 2012-08-04 DIAGNOSIS — E785 Hyperlipidemia, unspecified: Secondary | ICD-10-CM | POA: Diagnosis not present

## 2012-08-04 DIAGNOSIS — J45909 Unspecified asthma, uncomplicated: Secondary | ICD-10-CM | POA: Diagnosis not present

## 2012-08-04 DIAGNOSIS — D649 Anemia, unspecified: Secondary | ICD-10-CM | POA: Diagnosis not present

## 2012-08-04 MED ORDER — FERROUS SULFATE 325 (65 FE) MG PO TABS: 325.0000 mg | ORAL_TABLET | Freq: Three times a day (TID) | ORAL | Status: DC

## 2012-08-04 MED ORDER — SODIUM CHLORIDE 0.9 % IV SOLN: 500.0000 mL | INTRAVENOUS | Status: DC

## 2012-08-04 NOTE — Patient Instructions (Addendum)
YOU HAD AN ENDOSCOPIC PROCEDURE TODAY AT THE [facility]: Refer to the procedure report that was given to you for any specific questions about what was found during the examination.  If the procedure report does not answer your questions, please call your gastroenterologist to clarify.  If you requested that your care partner not be given the details of your procedure findings, then the procedure report has been included in a sealed envelope for you to review at your convenience later.  YOU SHOULD EXPECT: Some feelings of bloating in the abdomen. Passage of more gas than usual.  Walking can help get rid of the air that was put into your GI tract during the procedure and reduce the bloating. If you had a lower endoscopy (such as a colonoscopy or flexible sigmoidoscopy) you may notice spotting of blood in your stool or on the toilet paper. If you underwent a bowel prep for your procedure, then you may not have a normal bowel movement for a few days.  DIET: Your first meal following the procedure should be a light meal and then it is ok to progress to your normal diet.  A half-sandwich or bowl of soup is an example of a good first meal.  Heavy or fried foods are harder to digest and may make you feel nauseous or bloated.  Likewise meals heavy in dairy and vegetables can cause extra gas to form and this can also increase the bloating.  Drink plenty of fluids but you should avoid alcoholic beverages for 24 hours.  ACTIVITY: Your care partner should take you home directly after the procedure.  You should plan to take it easy, moving slowly for the rest of the day.  You can resume normal activity the day after the procedure however you should NOT DRIVE or use heavy machinery for 24 hours (because of the sedation medicines used during the test).    SYMPTOMS TO REPORT IMMEDIATELY: A gastroenterologist can be reached at any hour.  During normal business hours, 8:30 AM to 5:00 PM Monday through Friday,  call [phone].  After hours and on weekends, please call the GI answering service at [phone] who will take a message and have the physician on call contact you.   Following upper endoscopy (EGD)  Vomiting of blood or coffee ground material  New chest pain or pain under the shoulder blades  Painful or persistently difficult swallowing  New shortness of breath  Fever of 100F or higher  Black, tarry-looking stools  FOLLOW UP: If any biopsies were taken you will be contacted by phone or by letter within the next 1-3 weeks.  Call your gastroenterologist if you have not heard about the biopsies in 3 weeks.  Our staff will call the home number listed on your records the next business day following your procedure to check on you and address any questions or concerns that you may have at that time regarding the information given to you following your procedure. This is a courtesy call and so if there is no answer at the home number and we have not heard from you through the emergency physician on call, we will assume that you have returned to your regular daily activities without incident.  SIGNATURES/CONFIDENTIALITY: You and/or your care partner have signed paperwork which will be entered into your electronic medical record.  These signatures attest to the fact that that the information above on your After Visit Summary has been reviewed and is understood.  Full responsibility   of the confidentiality of this discharge information lies with you and/or your care-partner.  Resume medications. 

## 2012-08-04 NOTE — Op Note (Signed)
[facility] [address] [place], [address]   ENDOSCOPY PROCEDURE REPORT  PATIENT: Thomas Hudson, Thomas Hudson  MR#: [id number] BIRTHDATE: Feb 09, 1941 , 71  yrs. old GENDER: Male ENDOSCOPIST: Roxy Cedar, MD REFERRED BY:  Marga Melnick, M.D. PROCEDURE DATE:  08/04/2012 PROCEDURE:  EGD w/ biopsy ASA CLASS:     Class II INDICATIONS:  Iron deficiency anemia.   Vitamin B12 anemia. MEDICATIONS: MAC sedation, administered by CRNA and propofol (Diprivan) 250mg  IV TOPICAL ANESTHETIC: Cetacaine Spray  DESCRIPTION OF PROCEDURE: After the risks benefits and alternatives of the procedure were thoroughly explained, informed consent was obtained.  The [facility] [id number] endoscope was introduced through the mouth and advanced to the third portion of the duodenum. Without limitations.  The instrument was slowly withdrawn as the mucosa was fully examined.      The esophagus was normal.  The stomach revealed multiple punctate erosions in the antrum.  The remainder of the stomach was normal. Clo biopsy was taken.  The duodenal bulb and post bulbar duodenum to the third portion were normal.  Area of the ampulla was observed.  Retroflexed views revealed a hiatal hernia.     The scope was then withdrawn from the patient and the procedure completed.  COMPLICATIONS: There were no complications. ENDOSCOPIC IMPRESSION: The esophagus was normal.  The stomach revealed multiple punctate erosions in the antrum.  The remainder of the stomach was normal. Clo biopsy was taken.  The duodenal bulb and post bulbar duodenum to the third portion were normal.  Area of the ampulla was observed.  RECOMMENDATIONS: 1.  Rx CLO if positive 2.  Continue iron and B12 3. Follow up blood counts with Dr Alwyn Ren  REPEAT EXAM:  eSigned:  Roxy Cedar, MD 08/04/2012 3:00 PM  CC:The Patient and Pecola Lawless, MD

## 2012-08-04 NOTE — Addendum Note (Signed)
Addended by: Lorie Phenix on: 08/04/2012 03:36 PM   Modules accepted: Orders

## 2012-08-04 NOTE — Progress Notes (Signed)
Patient did not experience any of the following events: a burn prior to discharge; a fall within the facility; wrong site/side/patient/procedure/implant event; or a hospital transfer or hospital admission upon discharge from the facility. (G8907) Patient did not have preoperative order for IV antibiotic SSI prophylaxis. (G8918)  

## 2012-08-05 ENCOUNTER — Telehealth: Payer: Self-pay | Admitting: *Deleted

## 2012-08-05 NOTE — Telephone Encounter (Signed)
  Follow up Call-  Call back number 08/04/2012  Post procedure Call Back phone  # [phone] or [phone]  Permission to leave phone message Yes     Patient questions:  Do you have a fever, pain , or abdominal swelling? no Pain Score  0 *  Have you tolerated food without any problems? yes  Have you been able to return to your normal activities? yes  Do you have any questions about your discharge instructions: Diet   no Medications  no Follow up visit  no  Do you have questions or concerns about your Care? no  Actions: * If pain score is 4 or above: No action needed, pain <4.

## 2012-08-06 LAB — HELICOBACTER PYLORI SCREEN-BIOPSY: UREASE: NEGATIVE

## 2012-08-10 ENCOUNTER — Other Ambulatory Visit (INDEPENDENT_AMBULATORY_CARE_PROVIDER_SITE_OTHER): Payer: Medicare Other

## 2012-08-10 ENCOUNTER — Ambulatory Visit (INDEPENDENT_AMBULATORY_CARE_PROVIDER_SITE_OTHER): Payer: Medicare Other | Admitting: *Deleted

## 2012-08-10 DIAGNOSIS — Z2911 Encounter for prophylactic immunotherapy for respiratory syncytial virus (RSV): Secondary | ICD-10-CM

## 2012-08-10 DIAGNOSIS — Z23 Encounter for immunization: Secondary | ICD-10-CM | POA: Diagnosis not present

## 2012-08-10 DIAGNOSIS — E538 Deficiency of other specified B group vitamins: Secondary | ICD-10-CM | POA: Diagnosis not present

## 2012-08-18 DIAGNOSIS — H2510 Age-related nuclear cataract, unspecified eye: Secondary | ICD-10-CM | POA: Diagnosis not present

## 2012-08-24 ENCOUNTER — Telehealth: Payer: Self-pay | Admitting: Internal Medicine

## 2012-08-24 NOTE — Telephone Encounter (Signed)
Patient's wife called requesting to speak with Dr. Alwyn Ren. Reason for call: Patient is having pain in hands and shoulders and has stopped B12 injections. He would like to know if he needs to restart them. Please advise.

## 2012-08-24 NOTE — Telephone Encounter (Signed)
Office visit recommended to optimally assess the symptoms. The symptoms sound rheumatologic should not be related to B12 deficiency

## 2012-08-25 ENCOUNTER — Telehealth: Payer: Self-pay | Admitting: Internal Medicine

## 2012-08-25 NOTE — Telephone Encounter (Signed)
Caller: Martha/Spouse; Phone: [phone]; Reason for Call: Caller would like to know if caller needs to continue B12 shots and if the patient needs to receive blood.  Caller states patient is still having aches and pains throughout hands and shoulders. Per [facility] and Dr Alwyn Ren- caller should schedule an appointment to discuss sxs further and should not be related to B12 deficiency. Caller will call back in AM 08/26/12 to schedule an appointment.

## 2012-08-25 NOTE — Telephone Encounter (Signed)
Tried to call Pt line busy will try again on tomorrow.

## 2012-08-26 ENCOUNTER — Ambulatory Visit (INDEPENDENT_AMBULATORY_CARE_PROVIDER_SITE_OTHER): Payer: Medicare Other | Admitting: Family Medicine

## 2012-08-26 ENCOUNTER — Encounter: Payer: Self-pay | Admitting: Internal Medicine

## 2012-08-26 ENCOUNTER — Encounter: Payer: Self-pay | Admitting: Family Medicine

## 2012-08-26 VITALS — BP 130/62 | HR 82 | Temp 98.3°F | Wt 182.0 lb

## 2012-08-26 DIAGNOSIS — M791 Myalgia, unspecified site: Secondary | ICD-10-CM

## 2012-08-26 DIAGNOSIS — IMO0001 Reserved for inherently not codable concepts without codable children: Secondary | ICD-10-CM

## 2012-08-26 DIAGNOSIS — D509 Iron deficiency anemia, unspecified: Secondary | ICD-10-CM | POA: Diagnosis not present

## 2012-08-26 DIAGNOSIS — E559 Vitamin D deficiency, unspecified: Secondary | ICD-10-CM | POA: Diagnosis not present

## 2012-08-26 LAB — CBC WITH DIFFERENTIAL/PLATELET
Basophils Relative: 0.7 % (ref 0.0–3.0)
Eosinophils Relative: 2.3 % (ref 0.0–5.0)
Lymphocytes Relative: 10.2 % — ABNORMAL LOW (ref 12.0–46.0)
MCV: 69.4 fl — ABNORMAL LOW (ref 78.0–100.0)
Monocytes Absolute: 0.6 10*3/uL (ref 0.1–1.0)
Neutrophils Relative %: 78.9 % — ABNORMAL HIGH (ref 43.0–77.0)
Platelets: 358 10*3/uL (ref 150.0–400.0)
RBC: 4.48 Mil/uL (ref 4.22–5.81)
WBC: 7.9 10*3/uL (ref 4.5–10.5)

## 2012-08-26 LAB — BASIC METABOLIC PANEL
BUN: 12 mg/dL (ref 6–23)
CO2: 23 mEq/L (ref 19–32)
Chloride: 109 mEq/L (ref 96–112)
Creatinine, Ser: 1 mg/dL (ref 0.4–1.5)
Glucose, Bld: 114 mg/dL — ABNORMAL HIGH (ref 70–99)
Potassium: 3.7 mEq/L (ref 3.5–5.1)

## 2012-08-26 LAB — RHEUMATOID FACTOR: Rhuematoid fact SerPl-aCnc: <10 IU/mL (ref <=14)

## 2012-08-26 LAB — SEDIMENTATION RATE: Sed Rate: 17 mm/hr (ref 0–22)

## 2012-08-26 LAB — FERRITIN: Ferritin: 5.3 ng/mL — ABNORMAL LOW (ref 22.0–322.0)

## 2012-08-26 MED ORDER — TRAMADOL HCL 50 MG PO TABS: 50.0000 mg | ORAL_TABLET | Freq: Three times a day (TID) | ORAL | Status: DC | PRN

## 2012-08-26 NOTE — Telephone Encounter (Signed)
Discuss with patient, appt scheduled. 

## 2012-08-26 NOTE — Patient Instructions (Addendum)
Myalgia, Adult Myalgia is the medical term for muscle pain. It is a symptom of many things. Nearly everyone at some time in their life has this. The most common cause for muscle pain is overuse or straining and more so when you are not in shape. Injuries and muscle bruises cause myalgias. Muscle pain without a history of injury can also be caused by a virus. It frequently comes along with the flu. Myalgia not caused by muscle strain can be present in a large number of infectious diseases. Some autoimmune diseases like lupus and fibromyalgia can cause muscle pain. Myalgia may be mild, or severe. SYMPTOMS  The symptoms of myalgia are simply muscle pain. Most of the time this is short lived and the pain goes away without treatment. DIAGNOSIS  Myalgia is diagnosed by your caregiver by taking your history. This means you tell him when the problems began, what they are, and what has been happening. If this has not been a long term problem, your caregiver may want to watch for a while to see what will happen. If it has been long term, they may want to do additional testing. TREATMENT  The treatment depends on what the underlying cause of the muscle pain is. Often anti-inflammatory medications will help. HOME CARE INSTRUCTIONS  If the pain in your muscles came from overuse, slow down your activities until the problems go away.  Myalgia from overuse of a muscle can be treated with alternating hot and cold packs on the muscle affected or with cold for the first couple days. If either heat or cold seems to make things worse, stop their use.  Apply ice to the sore area for 15 to 20 minutes, 3 to 4 times per day, while awake for the first 2 days of muscle soreness, or as directed. Put the ice in a plastic bag and place a towel between the bag of ice and your skin.  Only take over-the-counter or prescription medicines for pain, discomfort, or fever as directed by your caregiver.  Regular gentle exercise may help  if you are not active.  Stretching before strenuous exercise can help lower the risk of myalgia. It is normal when beginning an exercise regimen to feel some muscle pain after exercising. Muscles that have not been used frequently will be sore at first. If the pain is extreme, this may mean injury to a muscle. SEEK MEDICAL CARE IF:  You have an increase in muscle pain that is not relieved with medication.  You begin to run a temperature.  You develop nausea and vomiting.  You develop a stiff and painful neck.  You develop a rash.  You develop muscle pain after a tick bite.  You have continued muscle pain while working out even after you are in good condition. SEEK IMMEDIATE MEDICAL CARE IF: Any of your problems are getting worse and medications are not helping. MAKE SURE YOU:   Understand these instructions.  Will watch your condition.  Will get help right away if you are not doing well or get worse. Document Released: 05/16/2006 Document Revised: 09/16/2011 Document Reviewed: 08/05/2006 [employer] Patient Information 2013 [employer].  

## 2012-08-26 NOTE — Assessment & Plan Note (Signed)
Check labs  Ultram prn pain

## 2012-08-26 NOTE — Assessment & Plan Note (Signed)
Check labs today--- if Hgb worse , he may need to go to ER secondary to sob as well If same --- we will need to get him to GI for transfusion

## 2012-08-26 NOTE — Progress Notes (Signed)
  Subjective:    Patient ID: Thomas Hudson, male    DOB: 12-13-1940, 72 y.o.   MRN: [id number]  HPI Pt here c/o pain all over.  Pt also has a hx of anemia ---iron and b12. Only b12 was checked a few weeks ago.   Pt states he is sob but not worse then previously.  Review of Systems As above    Objective:   Physical Exam BP 130/62  Pulse 82  Temp(Src) 98.3 F (36.8 C) (Oral)  Wt 182 lb (82.555 kg)  BMI 28.3 kg/m2  SpO2 98% General appearance: alert, cooperative, appears stated age and no distress Lungs: clear to auscultation bilaterally Heart: S1, S2 normal MS-- swelling in fingers/ hands       Assessment & Plan:

## 2012-08-27 LAB — ANA: Anti Nuclear Antibody(ANA): NEGATIVE

## 2012-08-31 ENCOUNTER — Telehealth: Payer: Self-pay | Admitting: *Deleted

## 2012-08-31 LAB — VITAMIN D 1,25 DIHYDROXY
Vitamin D 1, 25 (OH)2 Total: 37 pg/mL (ref 18–72)
Vitamin D2 1, 25 (OH)2: <8 pg/mL
Vitamin D3 1, 25 (OH)2: 37 pg/mL

## 2012-08-31 NOTE — Telephone Encounter (Signed)
Pt states that the tramadol work for only 2 hours then the pain returns and he is unable to take med until 6 hours later. Pt c/o numbness, swelling, and shoulder, hand, wrist, fingers on left side. Pt notes that arm hurts so back he can barely lift it.

## 2012-08-31 NOTE — Telephone Encounter (Signed)
Discuss with patient, appt scheduled. 

## 2012-08-31 NOTE — Telephone Encounter (Signed)
Pt probably needs f/u with hopp--  Ultram should last longer---will forward to hopp

## 2012-08-31 NOTE — Telephone Encounter (Signed)
Sounds like polymyalgia rheumatica but sed rate was 17; recommend repeat sed rate with CK, CCP & f/u OV. Codes: myalgias,arthralgias

## 2012-09-01 ENCOUNTER — Encounter: Payer: Self-pay | Admitting: Lab

## 2012-09-01 ENCOUNTER — Other Ambulatory Visit (INDEPENDENT_AMBULATORY_CARE_PROVIDER_SITE_OTHER): Payer: Medicare Other

## 2012-09-01 DIAGNOSIS — M25559 Pain in unspecified hip: Secondary | ICD-10-CM | POA: Diagnosis not present

## 2012-09-01 LAB — SEDIMENTATION RATE: Sed Rate: 23 mm/hr — ABNORMAL HIGH (ref 0–22)

## 2012-09-02 ENCOUNTER — Ambulatory Visit (INDEPENDENT_AMBULATORY_CARE_PROVIDER_SITE_OTHER): Payer: Medicare Other | Admitting: Internal Medicine

## 2012-09-02 ENCOUNTER — Encounter: Payer: Self-pay | Admitting: Internal Medicine

## 2012-09-02 VITALS — BP 130/78 | HR 88 | Wt 179.0 lb

## 2012-09-02 DIAGNOSIS — M2550 Pain in unspecified joint: Secondary | ICD-10-CM

## 2012-09-02 DIAGNOSIS — D509 Iron deficiency anemia, unspecified: Secondary | ICD-10-CM | POA: Diagnosis not present

## 2012-09-02 DIAGNOSIS — M06049 Rheumatoid arthritis without rheumatoid factor, unspecified hand: Secondary | ICD-10-CM | POA: Insufficient documentation

## 2012-09-02 LAB — HEPATIC FUNCTION PANEL
ALT: 13 U/L (ref 0–53)
AST: 19 U/L (ref 0–37)
Alkaline Phosphatase: 65 U/L (ref 39–117)
Bilirubin, Direct: 0.1 mg/dL (ref 0.0–0.3)
Total Protein: 6.9 g/dL (ref 6.0–8.3)

## 2012-09-02 MED ORDER — TRAMADOL HCL 50 MG PO TABS: 50.0000 mg | ORAL_TABLET | ORAL | Status: DC | PRN

## 2012-09-02 NOTE — Patient Instructions (Signed)
Review and correct the record as indicated. Please share record with all medical staff seen.  

## 2012-09-02 NOTE — Progress Notes (Signed)
Subjective:    Patient ID: Thomas Hudson, male    DOB: 04-27-1941, 72 y.o.   MRN: [id number]  HPI The pain began January in the R hand> L hand but has spread to involve the entire RUE to level of shoulder  without associated injury or trigger .This week it has involved the entire LUE to lesser extent. It is described as stiffness up to level 10. It is exacerbated by gripping; it is worse in am for up to 1 hour  Associated signs/symptoms include hand & wrist swelling w/o skin color change or temperature change. The pain was treated with Tramadol ; response was partial for 2-3 hrs . Some  numbness and tingling in hands.Some UE& LE weakness.  His profound anemia is improving; his most recent hematocrit was 31.1 on 08/26/12. Sedimentation rate has been 17 and 23. TSH was 0.97. Rheumatoid factor was less than 10; ANA was negative;& CK 55. CCP is pending. His vitamin D is slightly low at 37.  His mother, 5 sisters, and one brother have been diagnosed with fibromyalgia        Review of Systems Constitutional: no fever, chills,  change in weight . He has night sweats. Skin:no rash, but legs itch from knees to ankles Neuro:  No incontinence (stool/urine).  Heme:no lymphadenopathy; abnormal bruising or bleeding .         Objective:   Physical Exam Gen.: adequately nourished in appearance. Alert, appropriate and cooperative throughout exam. Head: Normocephalic without obvious abnormalities  Eyes: No corneal or conjunctival inflammation noted. No icterus. Mouth: Oral mucosa and oropharynx reveal no lesions or exudates. Teeth in good repair. Neck: No deformities, masses, or tenderness noted. Range of motion & Thyroid normal. Lungs: Normal respiratory effort; chest expands symmetrically. Lungs are clear to auscultation without rales, wheezes, or increased work of breathing. Heart: Normal rate and rhythm. Normal S1 and S2. No gallop, click, or rub. Grade 1/6 systolic murmur Abdomen: Bowel sounds  normal; abdomen soft and nontender. No masses, organomegaly or hernias noted.                             Musculoskeletal/extremities: There is some asymmetry of the posterior thoracic musculature suggesting occult scoliosis. No clubbing, cyanosis, edema, or significant extremity  deformity noted. Range of motion decreased @ R shoulder & hips due to pain with ROM .Tone & strength  Normal. Joints normal. Nail health good. Able to lie down & sit up w/o help. Negative SLR bilaterally Vascular: Carotid, radial artery, dorsalis pedis and  posterior tibial pulses are full and equal. No bruits present. Neurologic: Alert and oriented x3. Deep tendon reflexes symmetrical and normal. Tremor of hands Gait normal  including heel & toe walking .        Skin: Intact without suspicious lesions or rashes. Lymph: No cervical, axillary lymphadenopathy present. Psych: Mood and affect are normal. Normally interactive                                                                                        Assessment & Plan:  #1 arthralgias of multiple joints  in the context of negative RA factor and minimally elevated sedimentation rate. CCP is pending. Strong family history of fibromyalgia  #2 profound anemia, improving  Plan: See orders & recommendations

## 2012-09-02 NOTE — Addendum Note (Signed)
Addended by: Maurice Small on: 09/02/2012 11:11 AM   Modules accepted: Orders

## 2012-09-04 ENCOUNTER — Ambulatory Visit: Payer: Medicare Other | Admitting: Internal Medicine

## 2012-09-04 DIAGNOSIS — Z79899 Other long term (current) drug therapy: Secondary | ICD-10-CM | POA: Diagnosis not present

## 2012-09-08 DIAGNOSIS — M79609 Pain in unspecified limb: Secondary | ICD-10-CM | POA: Diagnosis not present

## 2012-09-08 DIAGNOSIS — M2550 Pain in unspecified joint: Secondary | ICD-10-CM | POA: Diagnosis not present

## 2012-09-17 ENCOUNTER — Encounter: Payer: Self-pay | Admitting: Lab

## 2012-09-17 DIAGNOSIS — M064 Inflammatory polyarthropathy: Secondary | ICD-10-CM | POA: Diagnosis not present

## 2012-09-18 ENCOUNTER — Ambulatory Visit (INDEPENDENT_AMBULATORY_CARE_PROVIDER_SITE_OTHER): Payer: Medicare Other

## 2012-09-18 DIAGNOSIS — D518 Other vitamin B12 deficiency anemias: Secondary | ICD-10-CM

## 2012-09-18 DIAGNOSIS — D519 Vitamin B12 deficiency anemia, unspecified: Secondary | ICD-10-CM

## 2012-09-18 MED ORDER — CYANOCOBALAMIN 1000 MCG/ML IJ SOLN
1000.0000 ug | Freq: Once | INTRAMUSCULAR | Status: AC
  Administered 2012-09-18: 1000 ug via INTRAMUSCULAR

## 2012-10-13 ENCOUNTER — Encounter: Payer: Self-pay | Admitting: Internal Medicine

## 2012-10-15 DIAGNOSIS — M064 Inflammatory polyarthropathy: Secondary | ICD-10-CM | POA: Diagnosis not present

## 2012-10-16 ENCOUNTER — Ambulatory Visit (INDEPENDENT_AMBULATORY_CARE_PROVIDER_SITE_OTHER): Payer: Medicare Other

## 2012-10-16 DIAGNOSIS — D518 Other vitamin B12 deficiency anemias: Secondary | ICD-10-CM | POA: Diagnosis not present

## 2012-10-16 DIAGNOSIS — D519 Vitamin B12 deficiency anemia, unspecified: Secondary | ICD-10-CM

## 2012-10-16 MED ORDER — CYANOCOBALAMIN 1000 MCG/ML IJ SOLN
1000.0000 ug | Freq: Once | INTRAMUSCULAR | Status: AC
  Administered 2012-10-16: 1000 ug via INTRAMUSCULAR

## 2012-11-13 ENCOUNTER — Ambulatory Visit: Payer: Medicare Other

## 2012-11-24 ENCOUNTER — Ambulatory Visit (INDEPENDENT_AMBULATORY_CARE_PROVIDER_SITE_OTHER): Payer: Medicare Other | Admitting: *Deleted

## 2012-11-24 ENCOUNTER — Ambulatory Visit: Payer: Medicare Other

## 2012-11-24 DIAGNOSIS — D509 Iron deficiency anemia, unspecified: Secondary | ICD-10-CM | POA: Diagnosis not present

## 2012-11-24 MED ORDER — CYANOCOBALAMIN 1000 MCG/ML IJ SOLN
1000.0000 ug | Freq: Once | INTRAMUSCULAR | Status: AC
  Administered 2012-11-24: 1000 ug via INTRAMUSCULAR

## 2012-12-11 ENCOUNTER — Ambulatory Visit (INDEPENDENT_AMBULATORY_CARE_PROVIDER_SITE_OTHER): Payer: Medicare Other | Admitting: *Deleted

## 2012-12-11 ENCOUNTER — Ambulatory Visit: Payer: Medicare Other

## 2012-12-11 DIAGNOSIS — E538 Deficiency of other specified B group vitamins: Secondary | ICD-10-CM

## 2012-12-11 MED ORDER — CYANOCOBALAMIN 1000 MCG/ML IJ SOLN
1000.0000 ug | Freq: Once | INTRAMUSCULAR | Status: AC
  Administered 2012-12-11: 1000 ug via INTRAMUSCULAR

## 2012-12-17 DIAGNOSIS — M79609 Pain in unspecified limb: Secondary | ICD-10-CM | POA: Diagnosis not present

## 2012-12-17 DIAGNOSIS — M25539 Pain in unspecified wrist: Secondary | ICD-10-CM | POA: Diagnosis not present

## 2013-01-07 ENCOUNTER — Ambulatory Visit (INDEPENDENT_AMBULATORY_CARE_PROVIDER_SITE_OTHER): Payer: Medicare Other | Admitting: *Deleted

## 2013-01-07 DIAGNOSIS — E538 Deficiency of other specified B group vitamins: Secondary | ICD-10-CM

## 2013-01-07 MED ORDER — CYANOCOBALAMIN 1000 MCG/ML IJ SOLN
1000.0000 ug | Freq: Once | INTRAMUSCULAR | Status: AC
  Administered 2013-01-07: 1000 ug via INTRAMUSCULAR

## 2013-01-11 ENCOUNTER — Other Ambulatory Visit: Payer: Self-pay | Admitting: Internal Medicine

## 2013-01-14 DIAGNOSIS — G5600 Carpal tunnel syndrome, unspecified upper limb: Secondary | ICD-10-CM | POA: Diagnosis not present

## 2013-01-14 DIAGNOSIS — M064 Inflammatory polyarthropathy: Secondary | ICD-10-CM | POA: Diagnosis not present

## 2013-01-15 ENCOUNTER — Telehealth: Payer: Self-pay | Admitting: Internal Medicine

## 2013-01-15 MED ORDER — CETIRIZINE HCL 10 MG PO TABS: 10.0000 mg | ORAL_TABLET | Freq: Every day | ORAL | Status: DC

## 2013-01-15 MED ORDER — LOSARTAN POTASSIUM 100 MG PO TABS: 100.0000 mg | ORAL_TABLET | Freq: Every day | ORAL | Status: DC

## 2013-01-15 MED ORDER — OMEPRAZOLE 20 MG PO CPDR: 20.0000 mg | DELAYED_RELEASE_CAPSULE | Freq: Every day | ORAL | Status: DC

## 2013-01-15 MED ORDER — PRAVASTATIN SODIUM 40 MG PO TABS: 40.0000 mg | ORAL_TABLET | Freq: Every day | ORAL | Status: DC

## 2013-01-15 NOTE — Telephone Encounter (Signed)
Patient aware, indicated he will pick-up on Monday. RX cancelled at local pharmacy (sent in error)

## 2013-01-15 NOTE — Telephone Encounter (Signed)
Caller: Levent/Patient; Phone: [phone]; Reason for Call: Will run out of current medications on Sun 7/13.  Asking for written Rx to cover until end of Sept when his CPE is scheduled.   Needing: Prilosec, Pravachol, Cozaar, Zyrtec.   Needs written Rx to be hand carried to [facility] pharmacy.  Needs to pick up today Fri 7/11 and is about 20 minutes from office.   Please review - needing Rx yet today 7/11.   Contact patient at [phone] or cell [phone].

## 2013-01-15 NOTE — Telephone Encounter (Signed)
RX's placed at the front for pick-up

## 2013-01-21 ENCOUNTER — Other Ambulatory Visit (INDEPENDENT_AMBULATORY_CARE_PROVIDER_SITE_OTHER): Payer: Medicare Other

## 2013-01-21 DIAGNOSIS — D649 Anemia, unspecified: Secondary | ICD-10-CM | POA: Diagnosis not present

## 2013-01-21 LAB — FERRITIN: Ferritin: 19.8 ng/mL — ABNORMAL LOW (ref 22.0–322.0)

## 2013-02-05 ENCOUNTER — Ambulatory Visit: Payer: Medicare Other

## 2013-02-08 ENCOUNTER — Ambulatory Visit (INDEPENDENT_AMBULATORY_CARE_PROVIDER_SITE_OTHER): Payer: Medicare Other

## 2013-02-08 DIAGNOSIS — E538 Deficiency of other specified B group vitamins: Secondary | ICD-10-CM

## 2013-02-08 DIAGNOSIS — N400 Enlarged prostate without lower urinary tract symptoms: Secondary | ICD-10-CM | POA: Diagnosis not present

## 2013-02-08 MED ORDER — CYANOCOBALAMIN 1000 MCG/ML IJ SOLN
1000.0000 ug | Freq: Once | INTRAMUSCULAR | Status: AC
  Administered 2013-02-08: 1000 ug via INTRAMUSCULAR

## 2013-03-05 ENCOUNTER — Ambulatory Visit (INDEPENDENT_AMBULATORY_CARE_PROVIDER_SITE_OTHER): Payer: Medicare Other | Admitting: *Deleted

## 2013-03-05 ENCOUNTER — Ambulatory Visit: Payer: Medicare Other

## 2013-03-05 DIAGNOSIS — E538 Deficiency of other specified B group vitamins: Secondary | ICD-10-CM | POA: Diagnosis not present

## 2013-03-05 MED ORDER — CYANOCOBALAMIN 1000 MCG/ML IJ SOLN
1000.0000 ug | Freq: Once | INTRAMUSCULAR | Status: AC
  Administered 2013-03-05: 1000 ug via INTRAMUSCULAR

## 2013-03-23 ENCOUNTER — Encounter: Payer: Self-pay | Admitting: Internal Medicine

## 2013-04-01 ENCOUNTER — Telehealth: Payer: Self-pay

## 2013-04-01 NOTE — Telephone Encounter (Signed)
HM UTD with exception of flu vaccine and only one pneumonia vaccine(HM denotes completed) Last EKG 02/27/2012 Urology follows PSA  BP elevated x 3-4 months. Last check 180/70(?) advised if checked today to bring to visit  B12 injection upon arrival per orders Will needed printed maintenance Rx to take to [facility]

## 2013-04-01 NOTE — Telephone Encounter (Signed)
Meds reconciled, pharmacy and allergies verified.  

## 2013-04-01 NOTE — Telephone Encounter (Signed)
error 

## 2013-04-02 ENCOUNTER — Ambulatory Visit (INDEPENDENT_AMBULATORY_CARE_PROVIDER_SITE_OTHER): Payer: Medicare Other | Admitting: Internal Medicine

## 2013-04-02 ENCOUNTER — Other Ambulatory Visit: Payer: Self-pay | Admitting: *Deleted

## 2013-04-02 ENCOUNTER — Encounter: Payer: Self-pay | Admitting: Internal Medicine

## 2013-04-02 ENCOUNTER — Ambulatory Visit: Payer: Medicare Other

## 2013-04-02 VITALS — BP 189/78 | HR 82 | Temp 98.5°F | Resp 18 | Ht 67.0 in | Wt 181.0 lb

## 2013-04-02 DIAGNOSIS — D509 Iron deficiency anemia, unspecified: Secondary | ICD-10-CM | POA: Diagnosis not present

## 2013-04-02 DIAGNOSIS — J019 Acute sinusitis, unspecified: Secondary | ICD-10-CM

## 2013-04-02 DIAGNOSIS — K219 Gastro-esophageal reflux disease without esophagitis: Secondary | ICD-10-CM

## 2013-04-02 DIAGNOSIS — D518 Other vitamin B12 deficiency anemias: Secondary | ICD-10-CM

## 2013-04-02 DIAGNOSIS — I10 Essential (primary) hypertension: Secondary | ICD-10-CM

## 2013-04-02 DIAGNOSIS — M069 Rheumatoid arthritis, unspecified: Secondary | ICD-10-CM

## 2013-04-02 DIAGNOSIS — J302 Other seasonal allergic rhinitis: Secondary | ICD-10-CM

## 2013-04-02 DIAGNOSIS — E785 Hyperlipidemia, unspecified: Secondary | ICD-10-CM | POA: Diagnosis not present

## 2013-04-02 DIAGNOSIS — J45909 Unspecified asthma, uncomplicated: Secondary | ICD-10-CM

## 2013-04-02 DIAGNOSIS — Z0000 Encounter for general adult medical examination without abnormal findings: Secondary | ICD-10-CM | POA: Diagnosis not present

## 2013-04-02 DIAGNOSIS — D519 Vitamin B12 deficiency anemia, unspecified: Secondary | ICD-10-CM

## 2013-04-02 DIAGNOSIS — N400 Enlarged prostate without lower urinary tract symptoms: Secondary | ICD-10-CM

## 2013-04-02 DIAGNOSIS — Z79899 Other long term (current) drug therapy: Secondary | ICD-10-CM | POA: Diagnosis not present

## 2013-04-02 LAB — HEPATIC FUNCTION PANEL
AST: 17 U/L (ref 0–37)
Albumin: 4.1 g/dL (ref 3.5–5.2)
Alkaline Phosphatase: 58 U/L (ref 39–117)
Total Protein: 6.7 g/dL (ref 6.0–8.3)

## 2013-04-02 LAB — BASIC METABOLIC PANEL
CO2: 24 mEq/L (ref 19–32)
Calcium: 9.1 mg/dL (ref 8.4–10.5)
GFR: 84.27 mL/min (ref >60.00)
Potassium: 3.3 mEq/L — ABNORMAL LOW (ref 3.5–5.1)
Sodium: 139 mEq/L (ref 135–145)

## 2013-04-02 LAB — CBC WITH DIFFERENTIAL/PLATELET
Basophils Relative: 0.1 % (ref 0.0–3.0)
HCT: 42.4 % (ref 39.0–52.0)
Hemoglobin: 14.2 g/dL (ref 13.0–17.0)
Lymphocytes Relative: 5.4 % — ABNORMAL LOW (ref 12.0–46.0)
Lymphs Abs: 0.6 10*3/uL — ABNORMAL LOW (ref 0.7–4.0)
MCHC: 33.6 g/dL (ref 30.0–36.0)
Monocytes Relative: 7.4 % (ref 3.0–12.0)
Neutro Abs: 8.9 10*3/uL — ABNORMAL HIGH (ref 1.4–7.7)
RBC: 4.8 Mil/uL (ref 4.22–5.81)

## 2013-04-02 LAB — TSH: TSH: 0.89 u[IU]/mL (ref 0.35–5.50)

## 2013-04-02 MED ORDER — PRAVASTATIN SODIUM 40 MG PO TABS: 40.0000 mg | ORAL_TABLET | Freq: Every day | ORAL | Status: DC

## 2013-04-02 MED ORDER — FINASTERIDE 5 MG PO TABS: 5.0000 mg | ORAL_TABLET | Freq: Every day | ORAL | Status: DC

## 2013-04-02 MED ORDER — FLUTICASONE PROPIONATE 50 MCG/ACT NA SUSP: 1.0000 | Freq: Two times a day (BID) | NASAL | Status: DC | PRN

## 2013-04-02 MED ORDER — LOSARTAN POTASSIUM 100 MG PO TABS: 100.0000 mg | ORAL_TABLET | Freq: Every day | ORAL | Status: DC

## 2013-04-02 MED ORDER — CYANOCOBALAMIN 1000 MCG/ML IJ SOLN
1000.0000 ug | Freq: Once | INTRAMUSCULAR | Status: AC
Start: 2013-04-02 — End: 2013-04-02
  Administered 2013-04-02: 1000 ug via INTRAMUSCULAR

## 2013-04-02 MED ORDER — OMEPRAZOLE 20 MG PO CPDR: 20.0000 mg | DELAYED_RELEASE_CAPSULE | Freq: Every day | ORAL | Status: DC

## 2013-04-02 MED ORDER — CETIRIZINE HCL 10 MG PO TABS: 10.0000 mg | ORAL_TABLET | Freq: Every day | ORAL | Status: DC

## 2013-04-02 MED ORDER — HYDROCHLOROTHIAZIDE 12.5 MG PO CAPS: 12.5000 mg | ORAL_CAPSULE | Freq: Every day | ORAL | Status: DC

## 2013-04-02 NOTE — Patient Instructions (Addendum)

## 2013-04-02 NOTE — Progress Notes (Signed)
Subjective:    Patient ID: Thomas Hudson, male    DOB: Mar 10, 1941, 72 y.o.   MRN: [id number]  HPI Medicare Wellness Visit:  Psychosocial & medical history were reviewed as required by Medicare (abuse,antisocial behavioral risks,firearm risk).  Social history: caffeine: 1 cup/day of coffee or less , alcohol: no  ,  tobacco use: quit 1999   Exercise :  See below Home & personal  safety / fall risk:no Limitations of activities of daily living:no Seatbelt  and smoke alarm use:yes Power of Attorney/Living Will status : needed Ophthalmology exam status :current Hearing evaluation status:not current Orientation :oriented X 3 Memory & recall :good Spelling  testing:good Active depression / anxiety:no Foreign travel history : 2010 [place] Immunization status for Shingles /Flu/ PNA/ tetanus : Flu needed Transfusion history:  no Preventive health surveillance status of colonoscopy as per protocol/ ION:[id number] Dental care:  Every 6 mos Chart reviewed &  Updated. Active issues reviewed & addressed.      Review of Systems He has minor self limited epistaxis. He denies hemoptysis, hematuria, melena, or rectal bleeding.He has no unexplained weight loss, dysphagia, or abdominal pain. He has no abnormal bruising or bleeding. He has no difficulty stopping bleeding with injury. On iron supplement. Home blood pressure range 170s-180s/70s.Patient is compliant with medications. No adverse effects noted from medication. No regular exercise program ;occasional walking. No specific dietary program ;no on low-salt diet. No chest pain, palpitations, claudication or paroxysmal nocturnal dyspnea described. Some DOE & edema.No significant lightheadedness, headache,  or syncope         Objective:   Physical Exam Gen.: Healthy and well-nourished in appearance. Alert, appropriate and cooperative throughout exam.Appears younger than stated age  Head: Normocephalic without obvious abnormalities; no alopecia   Eyes: No corneal or conjunctival inflammation noted. Pupils equal round reactive to light and accommodation.  Extraocular motion intact. Vision grossly normal with lenses Ears: External  ear exam reveals no significant lesions or deformities. Canals clear .TMs normal. Hearing is grossly decreased bilaterally. Nose: External nasal exam reveals no deformity or inflammation. Nasal mucosa are dry. No lesions or exudates noted.   Mouth: Oral mucosa and oropharynx reveal no lesions or exudates. Teeth in good repair. Neck: No deformities, masses, or tenderness noted. Range of motion & Thyroid normal. Lungs: Normal respiratory effort; chest expands symmetrically. Lungs are clear to auscultation without rales, wheezes, or increased work of breathing. Heart: Normal rate and rhythm. Normal S1 and S2. No gallop, click, or rub. Grade 1/6 systolic murmur. Abdomen: Bowel sounds normal; abdomen soft and nontender. No masses, organomegaly or hernias noted. Mid op scar Genitalia: As per Urology                                 Musculoskeletal/extremities: There is some asymmetry of the posterior thoracic musculature suggesting occult scoliosis. No clubbing, cyanosis, edema, or significant extremity  deformity noted. Range of motion normal .Tone & strength  Normal. Joints  reveal minimal joint changes. Nail health good. Able to lie down & sit up w/o help. Negative SLR bilaterally Vascular: Carotid, radial artery, dorsalis pedis and  posterior tibial pulses are full and equal. No bruits present. Neurologic: Alert and oriented x3. Deep tendon reflexes symmetrical and normal.         Skin: Intact without suspicious lesions or rashes. Lymph: No cervical, axillary lymphadenopathy present. Psych: Mood and affect are normal. Normally interactive  Assessment & Plan:  #1 Medicare Wellness Exam; criteria met ; data entered #2  Problem List/Diagnoses reviewed #3 HTN , uncontrolled Plan:  Assessments made/ Orders entered

## 2013-04-03 ENCOUNTER — Encounter: Payer: Self-pay | Admitting: Internal Medicine

## 2013-04-03 ENCOUNTER — Other Ambulatory Visit: Payer: Self-pay | Admitting: Internal Medicine

## 2013-04-03 DIAGNOSIS — E876 Hypokalemia: Secondary | ICD-10-CM

## 2013-04-03 DIAGNOSIS — D509 Iron deficiency anemia, unspecified: Secondary | ICD-10-CM

## 2013-04-12 ENCOUNTER — Other Ambulatory Visit: Payer: Self-pay | Admitting: *Deleted

## 2013-04-12 DIAGNOSIS — I10 Essential (primary) hypertension: Secondary | ICD-10-CM

## 2013-04-12 MED ORDER — LOSARTAN POTASSIUM 100 MG PO TABS: 100.0000 mg | ORAL_TABLET | Freq: Every day | ORAL | Status: DC

## 2013-04-15 DIAGNOSIS — G5600 Carpal tunnel syndrome, unspecified upper limb: Secondary | ICD-10-CM | POA: Diagnosis not present

## 2013-04-15 DIAGNOSIS — M25539 Pain in unspecified wrist: Secondary | ICD-10-CM | POA: Diagnosis not present

## 2013-04-30 ENCOUNTER — Ambulatory Visit: Payer: Medicare Other

## 2013-05-03 ENCOUNTER — Ambulatory Visit (INDEPENDENT_AMBULATORY_CARE_PROVIDER_SITE_OTHER): Payer: Medicare Other | Admitting: *Deleted

## 2013-05-03 DIAGNOSIS — Z23 Encounter for immunization: Secondary | ICD-10-CM

## 2013-05-03 DIAGNOSIS — E538 Deficiency of other specified B group vitamins: Secondary | ICD-10-CM | POA: Diagnosis not present

## 2013-05-03 MED ORDER — CYANOCOBALAMIN 1000 MCG/ML IJ SOLN
1000.0000 ug | Freq: Once | INTRAMUSCULAR | Status: AC
  Administered 2013-05-03: 1000 ug via INTRAMUSCULAR

## 2013-05-18 ENCOUNTER — Encounter: Payer: Self-pay | Admitting: Internal Medicine

## 2013-05-28 ENCOUNTER — Ambulatory Visit: Payer: Medicare Other

## 2013-05-28 ENCOUNTER — Ambulatory Visit: Payer: Medicare Other | Admitting: *Deleted

## 2013-05-28 DIAGNOSIS — E538 Deficiency of other specified B group vitamins: Secondary | ICD-10-CM

## 2013-05-28 MED ORDER — CYANOCOBALAMIN 1000 MCG/ML IJ SOLN
1000.0000 ug | Freq: Once | INTRAMUSCULAR | Status: AC
  Administered 2013-05-28: 1000 ug via INTRAMUSCULAR

## 2013-05-31 DIAGNOSIS — M064 Inflammatory polyarthropathy: Secondary | ICD-10-CM | POA: Diagnosis not present

## 2013-06-25 ENCOUNTER — Ambulatory Visit (INDEPENDENT_AMBULATORY_CARE_PROVIDER_SITE_OTHER): Payer: Medicare Other | Admitting: *Deleted

## 2013-06-25 DIAGNOSIS — E538 Deficiency of other specified B group vitamins: Secondary | ICD-10-CM

## 2013-06-25 MED ORDER — CYANOCOBALAMIN 1000 MCG/ML IJ SOLN
1000.0000 ug | Freq: Once | INTRAMUSCULAR | Status: AC
  Administered 2013-06-25: 1000 ug via INTRAMUSCULAR

## 2013-07-13 DIAGNOSIS — M064 Inflammatory polyarthropathy: Secondary | ICD-10-CM | POA: Diagnosis not present

## 2013-07-26 ENCOUNTER — Ambulatory Visit: Payer: Medicare Other

## 2013-07-27 ENCOUNTER — Ambulatory Visit (INDEPENDENT_AMBULATORY_CARE_PROVIDER_SITE_OTHER): Payer: Medicare Other | Admitting: *Deleted

## 2013-07-27 DIAGNOSIS — E538 Deficiency of other specified B group vitamins: Secondary | ICD-10-CM

## 2013-07-27 MED ORDER — CYANOCOBALAMIN 1000 MCG/ML IJ SOLN
1000.0000 ug | Freq: Once | INTRAMUSCULAR | Status: AC
  Administered 2013-07-27: 1000 ug via INTRAMUSCULAR

## 2013-08-23 ENCOUNTER — Ambulatory Visit (INDEPENDENT_AMBULATORY_CARE_PROVIDER_SITE_OTHER): Payer: Medicare Other

## 2013-08-23 DIAGNOSIS — E538 Deficiency of other specified B group vitamins: Secondary | ICD-10-CM

## 2013-08-23 MED ORDER — CYANOCOBALAMIN 1000 MCG/ML IJ SOLN
1000.0000 ug | Freq: Once | INTRAMUSCULAR | Status: AC
  Administered 2013-08-23: 1000 ug via INTRAMUSCULAR

## 2013-09-20 ENCOUNTER — Ambulatory Visit: Payer: Medicare Other

## 2013-09-22 ENCOUNTER — Ambulatory Visit (INDEPENDENT_AMBULATORY_CARE_PROVIDER_SITE_OTHER): Payer: Medicare Other

## 2013-09-22 DIAGNOSIS — E538 Deficiency of other specified B group vitamins: Secondary | ICD-10-CM | POA: Diagnosis not present

## 2013-09-22 MED ORDER — CYANOCOBALAMIN 1000 MCG/ML IJ SOLN
1000.0000 ug | Freq: Once | INTRAMUSCULAR | Status: AC
  Administered 2013-09-22: 1000 ug via INTRAMUSCULAR

## 2013-10-08 ENCOUNTER — Other Ambulatory Visit: Payer: Self-pay | Admitting: Internal Medicine

## 2013-10-14 DIAGNOSIS — M79609 Pain in unspecified limb: Secondary | ICD-10-CM | POA: Diagnosis not present

## 2013-10-14 DIAGNOSIS — M064 Inflammatory polyarthropathy: Secondary | ICD-10-CM | POA: Diagnosis not present

## 2013-10-25 ENCOUNTER — Encounter: Payer: Self-pay | Admitting: Physician Assistant

## 2013-10-25 ENCOUNTER — Ambulatory Visit (INDEPENDENT_AMBULATORY_CARE_PROVIDER_SITE_OTHER): Payer: Medicare Other | Admitting: *Deleted

## 2013-10-25 ENCOUNTER — Ambulatory Visit (INDEPENDENT_AMBULATORY_CARE_PROVIDER_SITE_OTHER): Payer: Medicare Other | Admitting: Physician Assistant

## 2013-10-25 VITALS — BP 154/75 | HR 76 | Temp 97.6°F | Resp 16 | Ht 67.0 in | Wt 181.2 lb

## 2013-10-25 DIAGNOSIS — J329 Chronic sinusitis, unspecified: Secondary | ICD-10-CM | POA: Diagnosis not present

## 2013-10-25 DIAGNOSIS — E538 Deficiency of other specified B group vitamins: Secondary | ICD-10-CM

## 2013-10-25 MED ORDER — MOXIFLOXACIN HCL 400 MG PO TABS: 400.0000 mg | ORAL_TABLET | Freq: Every day | ORAL | Status: DC

## 2013-10-25 MED ORDER — HYDROCODONE-HOMATROPINE 5-1.5 MG/5ML PO SYRP: 5.0000 mL | ORAL_SOLUTION | Freq: Three times a day (TID) | ORAL | Status: DC | PRN

## 2013-10-25 MED ORDER — CYANOCOBALAMIN 1000 MCG/ML IJ SOLN
1000.0000 ug | Freq: Once | INTRAMUSCULAR | Status: AC
  Administered 2013-10-25: 1000 ug via INTRAMUSCULAR

## 2013-10-25 MED ORDER — METHOTREXATE 2.5 MG PO TABS: 25.0000 mg | ORAL_TABLET | ORAL | Status: DC

## 2013-10-25 NOTE — Patient Instructions (Signed)
Please take antibiotic as directed (Avelox).  Increase fluid intake.  Rest.  Use Saline nasal spray.  Resume allergy medication and Flonase. Place a humidifier in the bedroom.  Call or return to clinic if symptoms are not improving.

## 2013-10-25 NOTE — Progress Notes (Signed)
Pre visit review using our clinic review tool, if applicable. No additional management support is needed unless otherwise documented below in the visit note/SLS  

## 2013-10-25 NOTE — Progress Notes (Signed)
Patient presents to clinic today c/o sinus pressure, sinus pain, tooth pain and dry cough x 3 weeks.  Patient denies ear pain, tinnitus, SOB or pleuritic chest pain.  Denies recent travel or sick contact.  Patient is on methotrexate once weekly for rheumatoid arthritis.  Took his methotrexate this morning.  Past Medical History  Diagnosis Date  . GERD (gastroesophageal reflux disease)   . Hyperlipidemia   . Hypertension   . BPH (benign prostatic hyperplasia)   . RAD (reactive airway disease)     no PMH of asthma; RAD post infection  . Carpal tunnel syndrome on both sides   . Rheumatoid arthritis     Dr Ouida Sills    Current Outpatient Prescriptions on File Prior to Visit  Medication Sig Dispense Refill  . cetirizine (ZYRTEC) 10 MG tablet Take 1 tablet (10 mg total) by mouth daily.  90 tablet  3  . Cyanocobalamin (VITAMIN B-12 IJ) Inject 1,000 mcg as directed once a week.      . ferrous sulfate 325 (65 FE) MG tablet TAKE 1 TABLET 3 TIMES A DAY WITH MEALS OR FOOD  90 tablet  3  . finasteride (PROSCAR) 5 MG tablet Take 1 tablet (5 mg total) by mouth daily.  90 tablet  3  . hydrochlorothiazide (MICROZIDE) 12.5 MG capsule TAKE 1 CAPSULE (12.5 MG TOTAL) BY MOUTH DAILY.  30 capsule  5  . losartan (COZAAR) 100 MG tablet Take 1 tablet (100 mg total) by mouth daily.  90 tablet  3  . omeprazole (PRILOSEC) 20 MG capsule Take 1 capsule (20 mg total) by mouth daily.  90 capsule  3  . pravastatin (PRAVACHOL) 40 MG tablet Take 1 tablet (40 mg total) by mouth daily.  90 tablet  3  . fluticasone (FLONASE) 50 MCG/ACT nasal spray Place 1 spray into the nose 2 (two) times daily as needed for rhinitis.  16 g  11   No current facility-administered medications on file prior to visit.    Allergies  Allergen Reactions  . Morphine And Related     Itching & rash Because of a history of documented adverse serious drug reaction;Medi Alert bracelet  is recommended    Family History  Problem Relation Age of  Onset  . Throat cancer Father   . Esophageal cancer Father   . Prostate cancer Paternal Uncle   . Heart attack Maternal Uncle     3 M-Uncles, ? ages  . Coronary artery disease Brother     MI pre 82  . Esophageal cancer Brother   . Skin cancer Mother   . Throat cancer Brother   . Uterine cancer Sister   . COPD Sister   . Ovarian cancer Sister   . Stroke Paternal Grandmother 70  . Colon cancer Paternal Grandfather   . Heart disease Paternal Grandfather   . Asthma Neg Hx   . Heart disease Maternal Grandfather   . Throat cancer Sister   . Lung cancer Sister     History   Social History  . Marital Status: Married    Spouse Name: N/A    Number of Children: 2  . Years of Education: N/A   Occupational History  . Retired    Social History Main Topics  . Smoking status: Former Smoker    Types: Cigarettes    Quit date: 07/08/1997  . Smokeless tobacco: Never Used     Comment: smoked age [age], up to 1 ppd  . Alcohol Use: No  .  Drug Use: No  . Sexual Activity: None   Other Topics Concern  . None   Social History Narrative  . None   Review of Systems - See HPI.  All other ROS are negative.  BP 154/75  Pulse 76  Temp(Src) 97.6 F (36.4 C) (Oral)  Resp 16  Ht 5\' 7"  (1.702 m)  Wt 181 lb 4 oz (82.214 kg)  BMI 28.38 kg/m2  SpO2 97%  Physical Exam  Vitals reviewed. Constitutional: He is oriented to person, place, and time and well-developed, well-nourished, and in no distress.  HENT:  Head: Normocephalic and atraumatic.  Right Ear: Tympanic membrane, external ear and ear canal normal.  Left Ear: Tympanic membrane, external ear and ear canal normal.  Nose: Right sinus exhibits maxillary sinus tenderness. Right sinus exhibits no frontal sinus tenderness. Left sinus exhibits maxillary sinus tenderness. Left sinus exhibits no frontal sinus tenderness.  Mouth/Throat: Uvula is midline, oropharynx is clear and moist and mucous membranes are normal.  Eyes: Conjunctivae are  normal. Pupils are equal, round, and reactive to light.  Neck: Neck supple.  Cardiovascular: Normal rate, regular rhythm, normal heart sounds and intact distal pulses.   Pulmonary/Chest: Effort normal and breath sounds normal. No respiratory distress. He has no wheezes. He has no rales. He exhibits no tenderness.  Lymphadenopathy:    He has no cervical adenopathy.  Neurological: He is alert and oriented to person, place, and time.  Skin: Skin is warm and dry. No rash noted.  Psychiatric: Affect normal.   Assessment/Plan: Sinusitis Rx Avelox.  Increase fluid intake.  Resume Flonase.  Place a humidifier in the bedroom.  Hydromet for cough.  Call or return if symptoms are not improving.

## 2013-10-25 NOTE — Assessment & Plan Note (Signed)
Rx Avelox.  Increase fluid intake.  Resume Flonase.  Place a humidifier in the bedroom.  Hydromet for cough.  Call or return if symptoms are not improving.

## 2013-11-22 ENCOUNTER — Ambulatory Visit: Payer: Medicare Other

## 2013-11-24 ENCOUNTER — Ambulatory Visit: Payer: Medicare Other | Admitting: *Deleted

## 2013-11-24 DIAGNOSIS — E538 Deficiency of other specified B group vitamins: Secondary | ICD-10-CM

## 2013-11-24 MED ORDER — CYANOCOBALAMIN 1000 MCG/ML IJ SOLN: 1000.0000 ug | Freq: Once | INTRAMUSCULAR | Status: DC

## 2013-12-20 ENCOUNTER — Ambulatory Visit: Payer: Medicare Other

## 2013-12-28 ENCOUNTER — Ambulatory Visit: Payer: Medicare Other

## 2013-12-30 ENCOUNTER — Ambulatory Visit (INDEPENDENT_AMBULATORY_CARE_PROVIDER_SITE_OTHER): Payer: Medicare Other | Admitting: *Deleted

## 2013-12-30 DIAGNOSIS — E538 Deficiency of other specified B group vitamins: Secondary | ICD-10-CM | POA: Diagnosis not present

## 2013-12-30 MED ORDER — CYANOCOBALAMIN 1000 MCG/ML IJ SOLN
1000.0000 ug | Freq: Once | INTRAMUSCULAR | Status: AC
  Administered 2013-12-30: 1000 ug via INTRAMUSCULAR

## 2014-01-11 DIAGNOSIS — M25519 Pain in unspecified shoulder: Secondary | ICD-10-CM | POA: Diagnosis not present

## 2014-01-11 DIAGNOSIS — M6530 Trigger finger, unspecified finger: Secondary | ICD-10-CM | POA: Diagnosis not present

## 2014-01-11 DIAGNOSIS — M064 Inflammatory polyarthropathy: Secondary | ICD-10-CM | POA: Diagnosis not present

## 2014-01-16 DIAGNOSIS — J019 Acute sinusitis, unspecified: Secondary | ICD-10-CM | POA: Diagnosis not present

## 2014-01-24 ENCOUNTER — Ambulatory Visit: Payer: Medicare Other

## 2014-01-27 ENCOUNTER — Ambulatory Visit (INDEPENDENT_AMBULATORY_CARE_PROVIDER_SITE_OTHER): Payer: Medicare Other | Admitting: *Deleted

## 2014-01-27 DIAGNOSIS — E538 Deficiency of other specified B group vitamins: Secondary | ICD-10-CM | POA: Diagnosis not present

## 2014-01-27 MED ORDER — CYANOCOBALAMIN 1000 MCG/ML IJ SOLN
1000.0000 ug | Freq: Once | INTRAMUSCULAR | Status: AC
  Administered 2014-01-27: 1000 ug via INTRAMUSCULAR

## 2014-01-27 NOTE — Progress Notes (Signed)
Pt came in for his monthly Vit B12 injection.  Pt tolerated injection well.  Next injection in 1 month and pt will schedule an appt.//AB/CMA

## 2014-01-29 DIAGNOSIS — M25519 Pain in unspecified shoulder: Secondary | ICD-10-CM | POA: Diagnosis not present

## 2014-02-09 DIAGNOSIS — M25559 Pain in unspecified hip: Secondary | ICD-10-CM | POA: Diagnosis not present

## 2014-02-10 DIAGNOSIS — N400 Enlarged prostate without lower urinary tract symptoms: Secondary | ICD-10-CM | POA: Diagnosis not present

## 2014-02-10 DIAGNOSIS — N401 Enlarged prostate with lower urinary tract symptoms: Secondary | ICD-10-CM | POA: Diagnosis not present

## 2014-02-10 DIAGNOSIS — N402 Nodular prostate without lower urinary tract symptoms: Secondary | ICD-10-CM | POA: Diagnosis not present

## 2014-02-10 DIAGNOSIS — N138 Other obstructive and reflux uropathy: Secondary | ICD-10-CM | POA: Diagnosis not present

## 2014-02-10 DIAGNOSIS — N139 Obstructive and reflux uropathy, unspecified: Secondary | ICD-10-CM | POA: Diagnosis not present

## 2014-02-17 DIAGNOSIS — M25819 Other specified joint disorders, unspecified shoulder: Secondary | ICD-10-CM | POA: Diagnosis not present

## 2014-02-21 ENCOUNTER — Ambulatory Visit: Payer: Medicare Other

## 2014-03-01 ENCOUNTER — Ambulatory Visit (INDEPENDENT_AMBULATORY_CARE_PROVIDER_SITE_OTHER): Payer: Medicare Other

## 2014-03-01 DIAGNOSIS — E538 Deficiency of other specified B group vitamins: Secondary | ICD-10-CM

## 2014-03-01 MED ORDER — CYANOCOBALAMIN 1000 MCG/ML IJ SOLN
1000.0000 ug | Freq: Once | INTRAMUSCULAR | Status: AC
  Administered 2014-03-01: 1000 ug via INTRAMUSCULAR

## 2014-03-28 ENCOUNTER — Ambulatory Visit: Payer: Medicare Other

## 2014-04-04 ENCOUNTER — Ambulatory Visit (INDEPENDENT_AMBULATORY_CARE_PROVIDER_SITE_OTHER): Payer: Medicare Other | Admitting: Internal Medicine

## 2014-04-04 ENCOUNTER — Encounter: Payer: Self-pay | Admitting: Internal Medicine

## 2014-04-04 ENCOUNTER — Other Ambulatory Visit (INDEPENDENT_AMBULATORY_CARE_PROVIDER_SITE_OTHER): Payer: Medicare Other

## 2014-04-04 VITALS — BP 140/78 | HR 67 | Temp 97.9°F | Ht 67.5 in | Wt 177.5 lb

## 2014-04-04 DIAGNOSIS — E785 Hyperlipidemia, unspecified: Secondary | ICD-10-CM

## 2014-04-04 DIAGNOSIS — D519 Vitamin B12 deficiency anemia, unspecified: Secondary | ICD-10-CM

## 2014-04-04 DIAGNOSIS — D509 Iron deficiency anemia, unspecified: Secondary | ICD-10-CM | POA: Diagnosis not present

## 2014-04-04 DIAGNOSIS — D518 Other vitamin B12 deficiency anemias: Secondary | ICD-10-CM

## 2014-04-04 DIAGNOSIS — I10 Essential (primary) hypertension: Secondary | ICD-10-CM | POA: Diagnosis not present

## 2014-04-04 DIAGNOSIS — J302 Other seasonal allergic rhinitis: Secondary | ICD-10-CM

## 2014-04-04 DIAGNOSIS — J309 Allergic rhinitis, unspecified: Secondary | ICD-10-CM

## 2014-04-04 DIAGNOSIS — R7309 Other abnormal glucose: Secondary | ICD-10-CM | POA: Diagnosis not present

## 2014-04-04 DIAGNOSIS — K219 Gastro-esophageal reflux disease without esophagitis: Secondary | ICD-10-CM

## 2014-04-04 LAB — HEPATIC FUNCTION PANEL
ALK PHOS: 64 U/L (ref 39–117)
ALT: 24 U/L (ref 0–53)
AST: 24 U/L (ref 0–37)
Albumin: 4.2 g/dL (ref 3.5–5.2)
BILIRUBIN DIRECT: 0.1 mg/dL (ref 0.0–0.3)
Total Bilirubin: 0.8 mg/dL (ref 0.2–1.2)
Total Protein: 7 g/dL (ref 6.0–8.3)

## 2014-04-04 LAB — LIPID PANEL
CHOL/HDL RATIO: 4
Cholesterol: 163 mg/dL (ref 0–200)
HDL: 37.5 mg/dL — AB (ref >39.00)
LDL CALC: 100 mg/dL — AB (ref 0–99)
NONHDL: 125.5
Triglycerides: 127 mg/dL (ref 0.0–149.0)
VLDL: 25.4 mg/dL (ref 0.0–40.0)

## 2014-04-04 LAB — FERRITIN: Ferritin: 40.4 ng/mL (ref 22.0–322.0)

## 2014-04-04 LAB — BASIC METABOLIC PANEL
BUN: 10 mg/dL (ref 6–23)
CO2: 26 mEq/L (ref 19–32)
CREATININE: 1.2 mg/dL (ref 0.4–1.5)
Calcium: 8.8 mg/dL (ref 8.4–10.5)
Chloride: 102 mEq/L (ref 96–112)
GFR: 65.92 mL/min (ref >60.00)
Glucose, Bld: 91 mg/dL (ref 70–99)
Potassium: 3.3 mEq/L — ABNORMAL LOW (ref 3.5–5.1)
Sodium: 137 mEq/L (ref 135–145)

## 2014-04-04 LAB — CBC WITH DIFFERENTIAL/PLATELET
BASOS ABS: 0 10*3/uL (ref 0.0–0.1)
Basophils Relative: 0.3 % (ref 0.0–3.0)
Eosinophils Absolute: 0.1 10*3/uL (ref 0.0–0.7)
Eosinophils Relative: 1.9 % (ref 0.0–5.0)
HCT: 43.9 % (ref 39.0–52.0)
Hemoglobin: 14.6 g/dL (ref 13.0–17.0)
Lymphocytes Relative: 9.8 % — ABNORMAL LOW (ref 12.0–46.0)
Lymphs Abs: 0.7 10*3/uL (ref 0.7–4.0)
MCHC: 33.2 g/dL (ref 30.0–36.0)
MCV: 95 fl (ref 78.0–100.0)
MONOS PCT: 11.4 % (ref 3.0–12.0)
Monocytes Absolute: 0.8 10*3/uL (ref 0.1–1.0)
Neutro Abs: 5.2 10*3/uL (ref 1.4–7.7)
Neutrophils Relative %: 76.6 % (ref 43.0–77.0)
PLATELETS: 286 10*3/uL (ref 150.0–400.0)
RBC: 4.63 Mil/uL (ref 4.22–5.81)
RDW: 15.5 % (ref 11.5–15.5)
WBC: 6.8 10*3/uL (ref 4.0–10.5)

## 2014-04-04 LAB — VITAMIN B12: Vitamin B-12: >1500 pg/mL — ABNORMAL HIGH (ref 211–911)

## 2014-04-04 LAB — HEMOGLOBIN A1C: HEMOGLOBIN A1C: 5.7 % (ref 4.6–6.5)

## 2014-04-04 LAB — TSH: TSH: 1.42 u[IU]/mL (ref 0.35–4.50)

## 2014-04-04 MED ORDER — FLUTICASONE PROPIONATE 50 MCG/ACT NA SUSP: 1.0000 | Freq: Two times a day (BID) | NASAL | Status: DC | PRN

## 2014-04-04 MED ORDER — HYDROCHLOROTHIAZIDE 12.5 MG PO CAPS: ORAL_CAPSULE | ORAL | Status: DC

## 2014-04-04 MED ORDER — CETIRIZINE HCL 10 MG PO TABS: 10.0000 mg | ORAL_TABLET | Freq: Every day | ORAL | Status: DC

## 2014-04-04 MED ORDER — OMEPRAZOLE 20 MG PO CPDR: 20.0000 mg | DELAYED_RELEASE_CAPSULE | Freq: Every day | ORAL | Status: DC

## 2014-04-04 MED ORDER — CYANOCOBALAMIN 1000 MCG/ML IJ SOLN
1000.0000 ug | Freq: Once | INTRAMUSCULAR | Status: AC
  Administered 2014-04-04: 1000 ug via INTRAMUSCULAR

## 2014-04-04 MED ORDER — LOSARTAN POTASSIUM 100 MG PO TABS: 100.0000 mg | ORAL_TABLET | Freq: Every day | ORAL | Status: DC

## 2014-04-04 MED ORDER — PRAVASTATIN SODIUM 40 MG PO TABS: 40.0000 mg | ORAL_TABLET | Freq: Every day | ORAL | Status: DC

## 2014-04-04 NOTE — Assessment & Plan Note (Signed)
Blood pressure goals reviewed. BMET 

## 2014-04-04 NOTE — Assessment & Plan Note (Signed)
CBC &  Ferritin

## 2014-04-04 NOTE — Progress Notes (Signed)
Pre visit review using our clinic review tool, if applicable. No additional management support is needed unless otherwise documented below in the visit note. 

## 2014-04-04 NOTE — Patient Instructions (Signed)
Your next office appointment will be determined based upon review of your pending labs. Those instructions will be transmitted to you through My Chart . 

## 2014-04-04 NOTE — Assessment & Plan Note (Signed)
A1c

## 2014-04-04 NOTE — Assessment & Plan Note (Signed)
B 12

## 2014-04-04 NOTE — Assessment & Plan Note (Addendum)
Lipids,  LFT,TSH

## 2014-04-04 NOTE — Progress Notes (Signed)
   Subjective:    Patient ID: Thomas Hudson, male    DOB: [birth year], 73 y.o.   MRN: [id number]  HPI  He is here to assess active health issues & conditions. PMH, FH, & Social history verified & updated   He has been compliant with his statin as well as his antihypertensive medications.  He is on a modified heart healthy diet. There is no regular cardiovascular exercise program. He has some exertional dyspnea.   He is not monitoring blood pressure at home  He denies any adverse effects from the medications.   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  He does describe some discomfort in his arches at night.    Review of Systems  Significant headaches, epistaxis, chest pain, palpitations,  claudication, paroxysmal nocturnal dyspnea, or edema absent. No GI symptoms , memory loss or myalgias       Objective:   Physical Exam  Gen.: Healthy and well-nourished in appearance. Alert, appropriate and cooperative throughout exam. Appears younger than stated age  Head: Normocephalic without obvious abnormalities; pattern alopecia  Eyes: No corneal or conjunctival inflammation noted. Pupils equal round reactive to light and accommodation. Extraocular motion intact. Ears: External  ear exam reveals no significant lesions or deformities. Canals clear .TMs normal. Hearing is grossly decreased on L. Nose: External nasal exam reveals no deformity or inflammation. Nasal mucosa are pink and moist. No lesions or exudates noted.   Mouth: Oral mucosa and oropharynx reveal no lesions or exudates. Teeth in good repair. Neck: No deformities, masses, or tenderness noted. Range of motion &. Thyroid  normal. Lungs: Normal respiratory effort; chest expands symmetrically. Lungs are clear to auscultation without rales, wheezes, or increased work of breathing. Heart: Normal rate and rhythm. Normal S1 and S2. No gallop, click, or rub. S4 w/o  murmur. Abdomen: Bowel sounds normal; abdomen soft and nontender. No masses, organomegaly or hernias noted. Genitalia:  as per Urology                            Musculoskeletal/extremities: No deformity or scoliosis noted of  the thoracic or lumbar spine.  No clubbing, cyanosis, edema, or significant extremity  deformity noted. Range of motion normal .Tone & strength normal. Hand joints normal Fingernail health good. Able to lie down & sit up w/o help. Negative SLR bilaterally Vascular: Carotid, radial artery, dorsalis pedis and  posterior tibial pulses are full and equal. No bruits present. Neurologic: Alert and oriented x3. Deep tendon reflexes symmetrical and normal. Slight tremor L hand Gait normal.      Skin: Intact without suspicious lesions or rashes. Lymph: No cervical, axillary lymphadenopathy present. Psych: Mood and affect are normal. Normally interactive                                                                                       Assessment & Plan:  See Current Assessment & Plan in Problem List under specific Diagnosis

## 2014-04-05 ENCOUNTER — Encounter: Payer: Self-pay | Admitting: Internal Medicine

## 2014-04-05 ENCOUNTER — Telehealth: Payer: Self-pay

## 2014-04-05 ENCOUNTER — Telehealth: Payer: Self-pay | Admitting: Internal Medicine

## 2014-04-05 MED ORDER — FERROUS SULFATE 325 (65 FE) MG PO TABS: ORAL_TABLET | ORAL | Status: DC

## 2014-04-05 MED ORDER — FERROUS SULFATE 325 (65 FE) MG PO TABS: 325.0000 mg | ORAL_TABLET | Freq: Three times a day (TID) | ORAL | Status: DC

## 2014-04-05 NOTE — Telephone Encounter (Signed)
Called patient to let him know I had refilled his Iron.  I told him I would leave the rx up front for him to pick up.  Patient agreed.

## 2014-04-05 NOTE — Telephone Encounter (Signed)
Reprinted rx in a 90 day supply per patient

## 2014-04-06 ENCOUNTER — Other Ambulatory Visit: Payer: Self-pay | Admitting: Internal Medicine

## 2014-04-06 DIAGNOSIS — E876 Hypokalemia: Secondary | ICD-10-CM

## 2014-04-06 DIAGNOSIS — I10 Essential (primary) hypertension: Secondary | ICD-10-CM

## 2014-04-06 MED ORDER — SPIRONOLACTONE 25 MG PO TABS: 25.0000 mg | ORAL_TABLET | Freq: Every day | ORAL | Status: DC

## 2014-04-07 ENCOUNTER — Ambulatory Visit (INDEPENDENT_AMBULATORY_CARE_PROVIDER_SITE_OTHER): Payer: Medicare Other | Admitting: Internal Medicine

## 2014-04-07 ENCOUNTER — Encounter: Payer: Self-pay | Admitting: Internal Medicine

## 2014-04-07 VITALS — BP 134/76 | HR 102 | Temp 98.5°F | Resp 14 | Wt 180.2 lb

## 2014-04-07 DIAGNOSIS — Z7189 Other specified counseling: Secondary | ICD-10-CM

## 2014-04-07 DIAGNOSIS — M069 Rheumatoid arthritis, unspecified: Secondary | ICD-10-CM | POA: Diagnosis not present

## 2014-04-07 DIAGNOSIS — N400 Enlarged prostate without lower urinary tract symptoms: Secondary | ICD-10-CM | POA: Diagnosis not present

## 2014-04-07 DIAGNOSIS — Z23 Encounter for immunization: Secondary | ICD-10-CM

## 2014-04-07 NOTE — Progress Notes (Signed)
Pre visit review using our clinic review tool, if applicable. No additional management support is needed unless otherwise documented below in the visit note. 

## 2014-04-07 NOTE — Progress Notes (Signed)
Subjective:    Patient ID: Thomas Hudson, male    DOB: December 20, 1940, 73 y.o.   MRN: [id number]  HPI Here to establish Recent visit with Dr Charlane Ferretti Dr Ouida Sills for his RA This has been controlled with MTX-- 25mg  Recent labs are fine Able to do fine whittling with an exacto knife now!  No problems with other meds  Voids okay on his regimen Sees Dr Junious Silk yearly for follow up  Current Outpatient Prescriptions on File Prior to Visit  Medication Sig Dispense Refill  . cetirizine (ZYRTEC) 10 MG tablet Take 1 tablet (10 mg total) by mouth daily.  90 tablet  3  . Cyanocobalamin (VITAMIN B-12 IJ) Inject 1,000 mcg as directed once a week.      . ferrous sulfate 325 (65 FE) MG tablet Take 3 tablets 3 times a day  270 tablet  3  . finasteride (PROSCAR) 5 MG tablet Take 1 tablet (5 mg total) by mouth daily.  90 tablet  3  . fluticasone (FLONASE) 50 MCG/ACT nasal spray Place 1 spray into both nostrils 2 (two) times daily as needed for rhinitis.  16 g  11  . folic acid (FOLVITE) 1 MG tablet Take 1 mg by mouth daily.      Marland Kitchen losartan (COZAAR) 100 MG tablet Take 1 tablet (100 mg total) by mouth daily.  90 tablet  3  . methotrexate (RHEUMATREX) 2.5 MG tablet Take 10 tablets (25 mg total) by mouth once a week. Caution:Chemotherapy. Protect from light.  40 tablet  0  . omeprazole (PRILOSEC) 20 MG capsule Take 1 capsule (20 mg total) by mouth daily.  90 capsule  3  . pravastatin (PRAVACHOL) 40 MG tablet Take 1 tablet (40 mg total) by mouth daily.  90 tablet  3  . spironolactone (ALDACTONE) 25 MG tablet Take 1 tablet (25 mg total) by mouth daily.  30 tablet  2   No current facility-administered medications on file prior to visit.    Allergies  Allergen Reactions  . Morphine And Related     Itching & rash Because of a history of documented adverse serious drug reaction;Medi Alert bracelet  is recommended    Past Medical History  Diagnosis Date  . GERD (gastroesophageal reflux disease)   .  Hyperlipidemia   . Hypertension   . BPH (benign prostatic hyperplasia)   . RAD (reactive airway disease)     no PMH of asthma; RAD post infection  . Carpal tunnel syndrome on both sides   . Rheumatoid arthritis     Dr Ouida Sills    Past Surgical History  Procedure Laterality Date  . Colonoscopy w/ polypectomy  2009    X 2; Dr.Perry   . Vasectomy    . Upper gastrointestinal endoscopy       gastric polyp;Dr.Perry   . Lumbar laminectomy  07/2008  . Whipple procedure  2012    partial ; [place]  . Hernia repair      Family History  Problem Relation Age of Onset  . Throat cancer Father     smoker  . Esophageal cancer Father   . Prostate cancer Paternal Uncle   . Heart attack Paternal Uncle     3 uncles, >55  . Coronary artery disease Brother     in 23s  . Esophageal cancer Brother   . Skin cancer Mother   . Throat cancer Brother   . Uterine cancer Sister   . COPD Sister  X2  . Ovarian cancer Sister   . Stroke Paternal Grandmother 45  . Colon cancer Paternal Grandfather   . Heart disease Paternal Grandfather   . Asthma Neg Hx   . Heart disease Maternal Grandfather   . Throat cancer Sister     smoker  . Lung cancer Sister     smoker  . Leukemia Sister     History   Social History  . Marital Status: Married    Spouse Name: N/A    Number of Children: 2  . Years of Education: N/A   Occupational History  . 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 Smoking status: Former Smoker    Types: Cigarettes    Quit date: 07/08/1997  . Smokeless tobacco: Never Used     Comment: smoked age [age], up to 1 ppd  . Alcohol Use: No  . Drug Use: No  . Sexual Activity: Not on file   Other Topics Concern  . Not on file   Social History Narrative   No living will   Requests wife as health care POA--- then daughters   Would accept resuscitation attempts   Not sure about tube feeds   Review of Systems     Objective:   Physical Exam    Constitutional: He appears well-developed and well-nourished. No distress.  Musculoskeletal:  Hands are quiet No synovitis or tenderness  Psychiatric: He has a normal mood and affect. His behavior is normal.          Assessment & Plan:

## 2014-04-07 NOTE — Assessment & Plan Note (Signed)
Discussed that ongoing screening with PSA and rectal exam may not be appropriate at his age He will continue with Dr Junious Silk

## 2014-04-07 NOTE — Assessment & Plan Note (Signed)
Controlled on current MTX dose Continues with Dr Ouida Sills

## 2014-04-07 NOTE — Addendum Note (Signed)
Addended by: Geni Bers on: 04/07/2014 04:48 PM   Modules accepted: Orders

## 2014-04-07 NOTE — Assessment & Plan Note (Signed)
See social history 

## 2014-04-13 DIAGNOSIS — M1990 Unspecified osteoarthritis, unspecified site: Secondary | ICD-10-CM | POA: Diagnosis not present

## 2014-04-13 DIAGNOSIS — M064 Inflammatory polyarthropathy: Secondary | ICD-10-CM | POA: Diagnosis not present

## 2014-04-25 ENCOUNTER — Ambulatory Visit: Payer: Medicare Other

## 2014-05-09 DIAGNOSIS — N400 Enlarged prostate without lower urinary tract symptoms: Secondary | ICD-10-CM | POA: Diagnosis not present

## 2014-05-09 DIAGNOSIS — N401 Enlarged prostate with lower urinary tract symptoms: Secondary | ICD-10-CM | POA: Diagnosis not present

## 2014-05-16 DIAGNOSIS — N139 Obstructive and reflux uropathy, unspecified: Secondary | ICD-10-CM | POA: Diagnosis not present

## 2014-05-16 DIAGNOSIS — N402 Nodular prostate without lower urinary tract symptoms: Secondary | ICD-10-CM | POA: Diagnosis not present

## 2014-05-16 DIAGNOSIS — N401 Enlarged prostate with lower urinary tract symptoms: Secondary | ICD-10-CM | POA: Diagnosis not present

## 2014-05-23 ENCOUNTER — Ambulatory Visit: Payer: Medicare Other

## 2014-06-03 ENCOUNTER — Ambulatory Visit (INDEPENDENT_AMBULATORY_CARE_PROVIDER_SITE_OTHER): Payer: Medicare Other | Admitting: *Deleted

## 2014-06-03 DIAGNOSIS — E538 Deficiency of other specified B group vitamins: Secondary | ICD-10-CM | POA: Diagnosis not present

## 2014-06-03 MED ORDER — CYANOCOBALAMIN 1000 MCG/ML IJ SOLN
1000.0000 ug | Freq: Once | INTRAMUSCULAR | Status: AC
  Administered 2014-06-03: 1000 ug via INTRAMUSCULAR

## 2014-06-27 ENCOUNTER — Ambulatory Visit: Payer: Medicare Other

## 2014-07-27 ENCOUNTER — Other Ambulatory Visit: Payer: Self-pay

## 2014-07-27 DIAGNOSIS — I10 Essential (primary) hypertension: Secondary | ICD-10-CM

## 2014-07-27 DIAGNOSIS — E876 Hypokalemia: Secondary | ICD-10-CM

## 2014-07-27 MED ORDER — SPIRONOLACTONE 25 MG PO TABS: 25.0000 mg | ORAL_TABLET | Freq: Every day | ORAL | Status: DC

## 2014-07-27 NOTE — Telephone Encounter (Signed)
Pt request refill spironolactone 25 mg to [facility]. Advised done.

## 2014-08-17 DIAGNOSIS — M0540 Rheumatoid myopathy with rheumatoid arthritis of unspecified site: Secondary | ICD-10-CM | POA: Diagnosis not present

## 2014-08-17 DIAGNOSIS — M150 Primary generalized (osteo)arthritis: Secondary | ICD-10-CM | POA: Diagnosis not present

## 2014-08-17 DIAGNOSIS — Z9229 Personal history of other drug therapy: Secondary | ICD-10-CM | POA: Diagnosis not present

## 2014-08-17 DIAGNOSIS — M1990 Unspecified osteoarthritis, unspecified site: Secondary | ICD-10-CM | POA: Diagnosis not present

## 2014-08-17 DIAGNOSIS — M064 Inflammatory polyarthropathy: Secondary | ICD-10-CM | POA: Diagnosis not present

## 2014-08-29 ENCOUNTER — Encounter: Payer: Self-pay | Admitting: Internal Medicine

## 2014-09-22 ENCOUNTER — Encounter: Payer: Self-pay | Admitting: Internal Medicine

## 2014-09-22 ENCOUNTER — Ambulatory Visit (INDEPENDENT_AMBULATORY_CARE_PROVIDER_SITE_OTHER): Payer: Medicare Other | Admitting: Internal Medicine

## 2014-09-22 VITALS — BP 136/70 | HR 71 | Temp 97.8°F | Wt 182.0 lb

## 2014-09-22 DIAGNOSIS — J069 Acute upper respiratory infection, unspecified: Secondary | ICD-10-CM | POA: Diagnosis not present

## 2014-09-22 MED ORDER — HYDROCODONE-HOMATROPINE 5-1.5 MG/5ML PO SYRP: 5.0000 mL | ORAL_SOLUTION | Freq: Four times a day (QID) | ORAL | Status: DC | PRN

## 2014-09-22 MED ORDER — AZITHROMYCIN 250 MG PO TABS: ORAL_TABLET | ORAL | Status: DC

## 2014-09-22 NOTE — Patient Instructions (Signed)
Upper Respiratory Infection, Adult An upper respiratory infection (URI) is also sometimes known as the common cold. The upper respiratory tract includes the nose, sinuses, throat, trachea, and bronchi. Bronchi are the airways leading to the lungs. Most people improve within 1 week, but symptoms can last up to 2 weeks. A residual cough may last even longer.  CAUSES Many different viruses can infect the tissues lining the upper respiratory tract. The tissues become irritated and inflamed and often become very moist. Mucus production is also common. A cold is contagious. You can easily spread the virus to others by oral contact. This includes kissing, sharing a glass, coughing, or sneezing. Touching your mouth or nose and then touching a surface, which is then touched by another person, can also spread the virus. SYMPTOMS  Symptoms typically develop 1 to 3 days after you come in contact with a cold virus. Symptoms vary from person to person. They may include:  Runny nose.  Sneezing.  Nasal congestion.  Sinus irritation.  Sore throat.  Loss of voice (laryngitis).  Cough.  Fatigue.  Muscle aches.  Loss of appetite.  Headache.  Low-grade fever. DIAGNOSIS  You might diagnose your own cold based on familiar symptoms, since most people get a cold 2 to 3 times a year. Your caregiver can confirm this based on your exam. Most importantly, your caregiver can check that your symptoms are not due to another disease such as strep throat, sinusitis, pneumonia, asthma, or epiglottitis. Blood tests, throat tests, and X-rays are not necessary to diagnose a common cold, but they may sometimes be helpful in excluding other more serious diseases. Your caregiver will decide if any further tests are required. RISKS AND COMPLICATIONS  You may be at risk for a more severe case of the common cold if you smoke cigarettes, have chronic heart disease (such as heart failure) or lung disease (such as asthma), or if  you have a weakened immune system. The very young and very old are also at risk for more serious infections. Bacterial sinusitis, middle ear infections, and bacterial pneumonia can complicate the common cold. The common cold can worsen asthma and chronic obstructive pulmonary disease (COPD). Sometimes, these complications can require emergency medical care and may be life-threatening. PREVENTION  The best way to protect against getting a cold is to practice good hygiene. Avoid oral or hand contact with people with cold symptoms. Wash your hands often if contact occurs. There is no clear evidence that vitamin C, vitamin E, echinacea, or exercise reduces the chance of developing a cold. However, it is always recommended to get plenty of rest and practice good nutrition. TREATMENT  Treatment is directed at relieving symptoms. There is no cure. Antibiotics are not effective, because the infection is caused by a virus, not by bacteria. Treatment may include:  Increased fluid intake. Sports drinks offer valuable electrolytes, sugars, and fluids.  Breathing heated mist or steam (vaporizer or shower).  Eating chicken soup or other clear broths, and maintaining good nutrition.  Getting plenty of rest.  Using gargles or lozenges for comfort.  Controlling fevers with ibuprofen or acetaminophen as directed by your caregiver.  Increasing usage of your inhaler if you have asthma. Zinc gel and zinc lozenges, taken in the first 24 hours of the common cold, can shorten the duration and lessen the severity of symptoms. Pain medicines may help with fever, muscle aches, and throat pain. A variety of non-prescription medicines are available to treat congestion and runny nose. Your caregiver   can make recommendations and may suggest nasal or lung inhalers for other symptoms.  HOME CARE INSTRUCTIONS   Only take over-the-counter or prescription medicines for pain, discomfort, or fever as directed by your  caregiver.  Use a warm mist humidifier or inhale steam from a shower to increase air moisture. This may keep secretions moist and make it easier to breathe.  Drink enough water and fluids to keep your urine clear or pale yellow.  Rest as needed.  Return to work when your temperature has returned to normal or as your caregiver advises. You may need to stay home longer to avoid infecting others. You can also use a face mask and careful hand washing to prevent spread of the virus. SEEK MEDICAL CARE IF:   After the first few days, you feel you are getting worse rather than better.  You need your caregiver's advice about medicines to control symptoms.  You develop chills, worsening shortness of breath, or brown or red sputum. These may be signs of pneumonia.  You develop yellow or brown nasal discharge or pain in the face, especially when you bend forward. These may be signs of sinusitis.  You develop a fever, swollen neck glands, pain with swallowing, or white areas in the back of your throat. These may be signs of strep throat. SEEK IMMEDIATE MEDICAL CARE IF:   You have a fever.  You develop severe or persistent headache, ear pain, sinus pain, or chest pain.  You develop wheezing, a prolonged cough, cough up blood, or have a change in your usual mucus (if you have chronic lung disease).  You develop sore muscles or a stiff neck. Document Released: 12/18/2000 Document Revised: 09/16/2011 Document Reviewed: 09/29/2013 Exit[employer] Patient Information 2015 [employer]. This information is not intended to replace advice given to you by your health care provider. Make sure you discuss any questions you have with your health care provider.  

## 2014-09-22 NOTE — Progress Notes (Signed)
Pre visit review using our clinic review tool, if applicable. No additional management support is needed unless otherwise documented below in the visit note. 

## 2014-09-22 NOTE — Progress Notes (Signed)
HPI  Pt presents to the clinic today with c/o sore throat, cough and chest congestion. He reports his symptoms started 1 month ago. The cough is productive of brown/yellow mucous. His chest hurts when he coughs. The cough is worse at night. He has run low grade fevers, had chills and body aches. He has tried Mucinex, cough drops, Zyrtec and Flonase without any relief. He does have a history of seasonal allergies. He has had sick contacts.  Review of Systems      Past Medical History  Diagnosis Date  . GERD (gastroesophageal reflux disease)   . Hyperlipidemia   . Hypertension   . BPH (benign prostatic hyperplasia)   . RAD (reactive airway disease)     no PMH of asthma; RAD post infection  . Carpal tunnel syndrome on both sides   . Rheumatoid arthritis     Dr Ouida Sills    Family History  Problem Relation Age of Onset  . Throat cancer Father     smoker  . Esophageal cancer Father   . Prostate cancer Paternal Uncle   . Heart attack Paternal Uncle     3 uncles, >55  . Coronary artery disease Brother     in 29s  . Esophageal cancer Brother   . Skin cancer Mother   . Throat cancer Brother   . Uterine cancer Sister   . COPD Sister     X62  . Ovarian cancer Sister   . Stroke Paternal Grandmother 46  . Colon cancer Paternal Grandfather   . Heart disease Paternal Grandfather   . Asthma Neg Hx   . Heart disease Maternal Grandfather   . Throat cancer Sister     smoker  . Lung cancer Sister     smoker  . Leukemia Sister     History   Social History  . Marital Status: Married    Spouse Name: N/A  . Number of Children: 2  . Years of Education: N/A   Occupational History  . 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 Smoking status: Former Smoker    Types: Cigarettes    Quit date: 07/08/1997  . Smokeless tobacco: Never Used     Comment: smoked age [age], up to 1 ppd  . Alcohol Use: No  . Drug Use: No  . Sexual Activity: Not on file   Other Topics  Concern  . Not on file   Social History Narrative   No living will   Requests wife as health care POA--- then daughters   Would accept resuscitation attempts   Not sure about tube feeds    Allergies  Allergen Reactions  . Morphine And Related     Itching & rash Because of a history of documented adverse serious drug reaction;Medi Alert bracelet  is recommended     Constitutional: Positive fatigue and fever. Denies headache or abrupt weight changes.  HEENT:  Positive sore throat. Denies eye redness, eye pain, pressure behind the eyes, facial pain, nasal congestion, ear pain, ringing in the ears, wax buildup, runny nose or bloody nose. Respiratory: Positive cough. Denies difficulty breathing or shortness of breath.  Cardiovascular: Denies chest pain, chest tightness, palpitations or swelling in the hands or feet.   No other specific complaints in a complete review of systems (except as listed in HPI above).  Objective:   BP 136/70 mmHg  Pulse 71  Temp(Src) 97.8 F (36.6 C) (Oral)  Wt 182 lb (82.555 kg)  SpO2 98% Wt Readings from Last 3 Encounters:  09/22/14 182 lb (82.555 kg)  04/07/14 180 lb 4 oz (81.761 kg)  04/04/14 177 lb 8 oz (80.513 kg)     General: Appears his stated age, well developed, well nourished in NAD. HEENT: Head: normal shape and size, no sinus tenderness noted; Ears: Tm's gray and intact, normal light reflex;  Throat/Mouth: + PND. Teeth present, mucosa erythematous and moist, no exudate noted, no lesions or ulcerations noted.  Neck: No lymphadenopathy. Cardiovascular: Normal rate and rhythm. S1,S2 noted.  No murmur, rubs or gallops noted.  Pulmonary/Chest: Normal effort and positive vesicular breath sounds. No respiratory distress. No wheezes, rales or ronchi noted.      Assessment & Plan:   Upper Respiratory Infection:  Get some rest and drink plenty of water Do salt water gargles/Ibuprofen for the sore throat eRx for Azithromax x 5 days Rx for  Hycodan cough syrup  RTC as needed or if symptoms persist.

## 2014-09-22 NOTE — Progress Notes (Signed)
Subjective:    Patient ID: Thomas Hudson, male    DOB: 1941/02/04, 74 y.o.   MRN: [id number]  HPI Mr. Perrault is a 74 year old male who presents today with chief complaint of cough for one month.  He is coughing up brown and yellow sputum and feels congested in his chest.  He does complain of chest wall soreness on the right upper chest.  He has tried OTC Mucinex, Zyrtec and Flonase with minimal relief.  He also has a sore throat that was worse last week and has gotten somewhat better this week.  He reports having a low grade fever at night.     Review of Systems  Constitutional: Positive for fever and fatigue. Negative for chills.  HENT: Positive for congestion, postnasal drip and sore throat. Negative for sinus pressure.   Respiratory: Positive for cough and shortness of breath. Negative for wheezing.   Cardiovascular: Negative for chest pain, palpitations and leg swelling.  Gastrointestinal: Negative.   Musculoskeletal: Negative for myalgias, back pain and gait problem.  Skin: Negative.   Neurological: Negative for dizziness, light-headedness and headaches.   Past Medical History  Diagnosis Date  . GERD (gastroesophageal reflux disease)   . Hyperlipidemia   . Hypertension   . BPH (benign prostatic hyperplasia)   . RAD (reactive airway disease)     no PMH of asthma; RAD post infection  . Carpal tunnel syndrome on both sides   . Rheumatoid arthritis     Dr Ouida Sills   Family History  Problem Relation Age of Onset  . Throat cancer Father     smoker  . Esophageal cancer Father   . Prostate cancer Paternal Uncle   . Heart attack Paternal Uncle     3 uncles, >55  . Coronary artery disease Brother     in 82s  . Esophageal cancer Brother   . Skin cancer Mother   . Throat cancer Brother   . Uterine cancer Sister   . COPD Sister     X76  . Ovarian cancer Sister   . Stroke Paternal Grandmother 27  . Colon cancer Paternal Grandfather   . Heart disease Paternal Grandfather   .  Asthma Neg Hx   . Heart disease Maternal Grandfather   . Throat cancer Sister     smoker  . Lung cancer Sister     smoker  . Leukemia Sister    Current Outpatient Prescriptions on File Prior to Visit  Medication Sig Dispense Refill  . cetirizine (ZYRTEC) 10 MG tablet Take 1 tablet (10 mg total) by mouth daily. 90 tablet 3  . Cyanocobalamin (VITAMIN B-12 IJ) Inject 1,000 mcg as directed every 30 (thirty) days.     . ferrous sulfate 325 (65 FE) MG tablet Take 3 tablets 3 times a day 270 tablet 3  . finasteride (PROSCAR) 5 MG tablet Take 1 tablet (5 mg total) by mouth daily. 90 tablet 3  . fluticasone (FLONASE) 50 MCG/ACT nasal spray Place 1 spray into both nostrils 2 (two) times daily as needed for rhinitis. 16 g 11  . folic acid (FOLVITE) 1 MG tablet Take 1 mg by mouth daily.    Marland Kitchen losartan (COZAAR) 100 MG tablet Take 1 tablet (100 mg total) by mouth daily. 90 tablet 3  . methotrexate (RHEUMATREX) 2.5 MG tablet Take 10 tablets (25 mg total) by mouth once a week. Caution:Chemotherapy. Protect from light. 40 tablet 0  . omeprazole (PRILOSEC) 20 MG capsule Take 1 capsule (  20 mg total) by mouth daily. 90 capsule 3  . pravastatin (PRAVACHOL) 40 MG tablet Take 1 tablet (40 mg total) by mouth daily. 90 tablet 3  . spironolactone (ALDACTONE) 25 MG tablet Take 1 tablet (25 mg total) by mouth daily. 30 tablet 5   No current facility-administered medications on file prior to visit.        Objective:   Physical Exam  Constitutional: He is oriented to person, place, and time. He appears well-developed and well-nourished.  HENT:  Head: Normocephalic and atraumatic.  Right Ear: External ear normal.  Mouth/Throat: Oropharynx is clear and moist.  Neck: Normal range of motion. Neck supple.  Cardiovascular: Normal rate, regular rhythm and normal heart sounds.   Pulmonary/Chest: Effort normal and breath sounds normal.  Patient has cough  Musculoskeletal: Normal range of motion.  Lymphadenopathy:     He has no cervical adenopathy.  Neurological: He is alert and oriented to person, place, and time.  Skin: Skin is warm and dry.          Assessment & Plan:  1. Upper respiratory infection Rx for Zpack and Hydromet cough syrup. Advised patient to continue taking flonase and zrytec.Follow up with office if symptoms worsen or do not improve in 3 days.

## 2014-09-27 ENCOUNTER — Encounter: Payer: Self-pay | Admitting: Internal Medicine

## 2014-11-14 ENCOUNTER — Ambulatory Visit (AMBULATORY_SURGERY_CENTER): Payer: Self-pay

## 2014-11-14 VITALS — Ht 67.5 in | Wt 185.0 lb

## 2014-11-14 DIAGNOSIS — Z860100 Personal history of colon polyps, unspecified: Secondary | ICD-10-CM

## 2014-11-14 NOTE — Progress Notes (Signed)
No allergies to eggs or soy No past problems with anesthesia No diet/weight loss meds No home oxygen

## 2014-11-18 DIAGNOSIS — M064 Inflammatory polyarthropathy: Secondary | ICD-10-CM | POA: Diagnosis not present

## 2014-11-18 DIAGNOSIS — M1990 Unspecified osteoarthritis, unspecified site: Secondary | ICD-10-CM | POA: Diagnosis not present

## 2014-11-24 ENCOUNTER — Ambulatory Visit (AMBULATORY_SURGERY_CENTER): Payer: Medicare Other | Admitting: Internal Medicine

## 2014-11-24 ENCOUNTER — Encounter: Payer: Self-pay | Admitting: Internal Medicine

## 2014-11-24 VITALS — BP 124/74 | HR 59 | Temp 96.7°F | Resp 16 | Ht 67.0 in | Wt 185.0 lb

## 2014-11-24 DIAGNOSIS — Z86010 Personal history of colonic polyps: Secondary | ICD-10-CM | POA: Diagnosis not present

## 2014-11-24 DIAGNOSIS — J45909 Unspecified asthma, uncomplicated: Secondary | ICD-10-CM | POA: Diagnosis not present

## 2014-11-24 DIAGNOSIS — I10 Essential (primary) hypertension: Secondary | ICD-10-CM | POA: Diagnosis not present

## 2014-11-24 MED ORDER — SODIUM CHLORIDE 0.9 % IV SOLN: 500.0000 mL | INTRAVENOUS | Status: DC

## 2014-11-24 NOTE — Patient Instructions (Signed)
YOU HAD AN ENDOSCOPIC PROCEDURE TODAY AT [facility]:   Refer to the procedure report that was given to you for any specific questions about what was found during the examination.  If the procedure report does not answer your questions, please call your gastroenterologist to clarify.  If you requested that your care partner not be given the details of your procedure findings, then the procedure report has been included in a sealed envelope for you to review at your convenience later.  YOU SHOULD EXPECT: Some feelings of bloating in the abdomen. Passage of more gas than usual.  Walking can help get rid of the air that was put into your GI tract during the procedure and reduce the bloating. If you had a lower endoscopy (such as a colonoscopy or flexible sigmoidoscopy) you may notice spotting of blood in your stool or on the toilet paper. If you underwent a bowel prep for your procedure, you may not have a normal bowel movement for a few days.  Please Note:  You might notice some irritation and congestion in your nose or some drainage.  This is from the oxygen used during your procedure.  There is no need for concern and it should clear up in a day or so.  SYMPTOMS TO REPORT IMMEDIATELY:   Following lower endoscopy (colonoscopy or flexible sigmoidoscopy):  Excessive amounts of blood in the stool  Significant tenderness or worsening of abdominal pains  Swelling of the abdomen that is new, acute  Fever of 100F or higher   For urgent or emergent issues, a gastroenterologist can be reached at any hour by calling [phone].   DIET: Your first meal following the procedure should be a small meal and then it is ok to progress to your normal diet. Heavy or fried foods are harder to digest and may make you feel nauseous or bloated.  Likewise, meals heavy in dairy and vegetables can increase bloating.  Drink plenty of fluids but you should avoid alcoholic beverages for 24 hours. Try to  increase the fiber in your diet due to your Diverticulosis.  ACTIVITY:  You should plan to take it easy for the rest of today and you should NOT DRIVE or use heavy machinery until tomorrow (because of the sedation medicines used during the test).    FOLLOW UP: Our staff will call the number listed on your records the next business day following your procedure to check on you and address any questions or concerns that you may have regarding the information given to you following your procedure. If we do not reach you, we will leave a message.  However, if you are feeling well and you are not experiencing any problems, there is no need to return our call.  We will assume that you have returned to your regular daily activities without incident.  Read all of the handouts given to you by your recovery room nurse. If any biopsies were taken you will be contacted by phone or by letter within the next 1-3 weeks.  Please call us at [phone] if you have not heard about the biopsies in 3 weeks.    SIGNATURES/CONFIDENTIALITY: You and/or your care partner have signed paperwork which will be entered into your electronic medical record.  These signatures attest to the fact that that the information above on your After Visit Summary has been reviewed and is understood.  Full responsibility of the confidentiality of this discharge information lies with you and/or your care-partner.

## 2014-11-24 NOTE — Op Note (Signed)
[place]  [address]. [place], [address]   COLONOSCOPY PROCEDURE REPORT  PATIENT: Thomas Hudson, Thomas Hudson  MR#: [id number] BIRTHDATE: 04-Aug-1940 , 54  yrs. old GENDER: male ENDOSCOPIST: Eustace Quail, MD REFERRED VF:[id number] Program Recall PROCEDURE DATE:  11/24/2014 PROCEDURE:   Colonoscopy, surveillance First Screening Colonoscopy - Avg.  risk and is 50 yrs.  old or older - No.  Prior Negative Screening - Now for repeat screening. N/A  History of Adenoma - Now for follow-up colonoscopy & has been > or = to 3 yrs.  Yes hx of adenoma.  Has been 3 or more years since last colonoscopy.  Polyps removed today? No Recommend repeat exam, <10 yrs? No ASA CLASS:   Class II INDICATIONS:Surveillance due to prior colonic neoplasia and PH Colon Adenoma.   Prior exams [phone] (-),11 (no polyps) MEDICATIONS: Monitored anesthesia care and Propofol 200 mg IV  DESCRIPTION OF PROCEDURE:   After the risks benefits and alternatives of the procedure were thoroughly explained, informed consent was obtained.  The digital rectal exam revealed no abnormalities of the rectum.   The LB [id number]  endoscope was introduced through the anus and advanced to the cecum, which was identified by both the appendix and ileocecal valve. No adverse events experienced.   The quality of the prep was excellent. (MoviPrep was used)  The instrument was then slowly withdrawn as the colon was fully examined. Estimated blood loss is zero unless otherwise noted in this procedure report.      COLON FINDINGS: There was moderate diverticulosis noted in the sigmoid colon.   The examination was otherwise normal.  Retroflexed views revealed internal hemorrhoids. The time to cecum = 2.0 Withdrawal time = 10.8   The scope was withdrawn and the procedure completed. COMPLICATIONS: There were no immediate complications.  ENDOSCOPIC IMPRESSION: 1.   Moderate diverticulosis was noted in the sigmoid  colon 2.   The examination was otherwise normal  RECOMMENDATIONS: 1. Return to the care of your primary provider.  GI follow up as needed  eSigned:  Eustace Quail, MD 11/24/2014 8:42 AM   cc: The Patient and [name], MD

## 2014-11-24 NOTE — Progress Notes (Signed)
Pt awake and alert, pleased with MAC, Report to RN

## 2014-11-25 ENCOUNTER — Telehealth: Payer: Self-pay | Admitting: *Deleted

## 2014-11-25 NOTE — Telephone Encounter (Signed)
  Follow up Call-  Call back number 11/24/2014 08/04/2012  Post procedure Call Back phone  # [phone] or [phone]  Permission to leave phone message Yes Yes     Patient questions:  Do you have a fever, pain , or abdominal swelling? No. Pain Score  0 *  Have you tolerated food without any problems? Yes.    Have you been able to return to your normal activities? Yes.    Do you have any questions about your discharge instructions: Diet   No. Medications  No. Follow up visit  No.  Do you have questions or concerns about your Care? No.  Actions: * If pain score is 4 or above: No action needed, pain <4.

## 2014-12-08 ENCOUNTER — Ambulatory Visit (INDEPENDENT_AMBULATORY_CARE_PROVIDER_SITE_OTHER): Payer: Medicare Other | Admitting: Internal Medicine

## 2014-12-08 ENCOUNTER — Encounter: Payer: Self-pay | Admitting: Internal Medicine

## 2014-12-08 VITALS — BP 140/80 | HR 73 | Temp 97.5°F | Wt 187.0 lb

## 2014-12-08 DIAGNOSIS — N644 Mastodynia: Secondary | ICD-10-CM | POA: Diagnosis not present

## 2014-12-08 DIAGNOSIS — I10 Essential (primary) hypertension: Secondary | ICD-10-CM

## 2014-12-08 MED ORDER — AMLODIPINE BESYLATE 2.5 MG PO TABS: 2.5000 mg | ORAL_TABLET | Freq: Every day | ORAL | Status: DC

## 2014-12-08 NOTE — Assessment & Plan Note (Addendum)
BP Readings from Last 3 Encounters:  12/08/14 140/80  11/24/14 124/74  09/22/14 136/70   Failed single therapy with losartan Will add low dose amlodipine with stopping the aldactone Dry mouth with HCTZ

## 2014-12-08 NOTE — Progress Notes (Signed)
Pre visit review using our clinic review tool, if applicable. No additional management support is needed unless otherwise documented below in the visit note. 

## 2014-12-08 NOTE — Patient Instructions (Signed)
Stop the spironolactone and start the amlodipine. I expect your breast sensitivity to ease up over the next few weeks.

## 2014-12-08 NOTE — Assessment & Plan Note (Signed)
Has gynecomastia as well Probably related to the spironolactone Finasteride less likely to be cause No masses Will try off the spironolactone

## 2014-12-08 NOTE — Progress Notes (Signed)
Subjective:    Patient ID: Thomas Hudson, male    DOB: 1941/04/09, 74 y.o.   MRN: [id number]  HPI Here due to breast pain  Right side--- about a month ago Felt like sticking a pin in it when touching it or wearing suspenders That soreness is better--but still episodic tenderness  Now has deeper pain and is "very sore"  FH of breast cancer in sisters  No nipple discharge  Current Outpatient Prescriptions on File Prior to Visit  Medication Sig Dispense Refill  . cetirizine (ZYRTEC) 10 MG tablet Take 1 tablet (10 mg total) by mouth daily. 90 tablet 3  . Cyanocobalamin (VITAMIN B-12 IJ) Inject 1,000 mcg as directed every 30 (thirty) days.     . ferrous sulfate 325 (65 FE) MG tablet Take 3 tablets 3 times a day 408 tablet 3  . folic acid (FOLVITE) 1 MG tablet Take 1 mg by mouth daily.    Marland Kitchen losartan (COZAAR) 100 MG tablet Take 1 tablet (100 mg total) by mouth daily. 90 tablet 3  . methotrexate (RHEUMATREX) 2.5 MG tablet Take 10 tablets (25 mg total) by mouth once a week. Caution:Chemotherapy. Protect from light. 40 tablet 0  . omeprazole (PRILOSEC) 20 MG capsule Take 1 capsule (20 mg total) by mouth daily. 90 capsule 3  . pravastatin (PRAVACHOL) 40 MG tablet Take 1 tablet (40 mg total) by mouth daily. 90 tablet 3  . spironolactone (ALDACTONE) 25 MG tablet Take 1 tablet (25 mg total) by mouth daily. 30 tablet 5   No current facility-administered medications on file prior to visit.    Allergies  Allergen Reactions  . Morphine And Related     Itching & rash Because of a history of documented adverse serious drug reaction;Medi Alert bracelet  is recommended    Past Medical History  Diagnosis Date  . GERD (gastroesophageal reflux disease)   . Hyperlipidemia   . Hypertension   . BPH (benign prostatic hyperplasia)   . RAD (reactive airway disease)     no PMH of asthma; RAD post infection  . Carpal tunnel syndrome on both sides   . Rheumatoid arthritis     Dr Ouida Sills    Past  Surgical History  Procedure Laterality Date  . Colonoscopy w/ polypectomy  2009    X 2; Dr.Perry   . Vasectomy    . Upper gastrointestinal endoscopy       gastric polyp;Dr.Perry   . Lumbar laminectomy  07/2008  . Whipple procedure  2012    partial ; [place]  . Hernia repair      Family History  Problem Relation Age of Onset  . Throat cancer Father     smoker  . Esophageal cancer Father   . Prostate cancer Paternal Uncle   . Heart attack Paternal Uncle     3 uncles, >55  . Coronary artery disease Brother     in 65s  . Esophageal cancer Brother   . Skin cancer Mother   . Throat cancer Brother   . Uterine cancer Sister   . COPD Sister     X23  . Ovarian cancer Sister   . Stroke Paternal Grandmother 55  . Colon cancer Paternal Grandfather   . Heart disease Paternal Grandfather   . Asthma Neg Hx   . Heart disease Maternal Grandfather   . Throat cancer Sister     smoker  . Lung cancer Sister     smoker  . Leukemia Sister  History   Social History  . Marital Status: Married    Spouse Name: N/A  . Number of Children: 2  . Years of Education: N/A   Occupational History  . 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 Smoking status: Former Smoker    Types: Cigarettes    Quit date: 07/08/1997  . Smokeless tobacco: Never Used     Comment: smoked age [age], up to 1 ppd  . Alcohol Use: No  . Drug Use: No  . Sexual Activity: Not on file   Other Topics Concern  . Not on file   Social History Narrative   No living will   Requests wife as health care POA--- then daughters   Would accept resuscitation attempts   Not sure about tube feeds   Review of Systems Feels warm at night--pain is worst then. Night sweats at times Appetite is fine Weight is drifting up    Objective:   Physical Exam  Genitourinary:  Gynecomastia but no masses or nipple discharge Some periareolar tenderness on right  Lymphadenopathy:    He has no axillary  adenopathy.          Assessment & Plan:

## 2014-12-09 NOTE — Addendum Note (Signed)
Addended by: Steva Ready on: 12/09/2014 11:33 AM   Modules accepted: Level of Service

## 2015-01-20 ENCOUNTER — Encounter: Payer: Self-pay | Admitting: Internal Medicine

## 2015-01-20 ENCOUNTER — Ambulatory Visit (INDEPENDENT_AMBULATORY_CARE_PROVIDER_SITE_OTHER): Payer: Medicare Other | Admitting: Internal Medicine

## 2015-01-20 VITALS — BP 146/76 | HR 77 | Temp 97.7°F | Wt 187.4 lb

## 2015-01-20 DIAGNOSIS — I10 Essential (primary) hypertension: Secondary | ICD-10-CM

## 2015-01-20 MED ORDER — AMLODIPINE BESYLATE 5 MG PO TABS: 5.0000 mg | ORAL_TABLET | Freq: Every day | ORAL | Status: DC

## 2015-01-20 NOTE — Progress Notes (Signed)
Subjective:    Patient ID: Thomas Hudson, male    DOB: 11/11/1940, 74 y.o.   MRN: [id number]  HPI Here for follow up of HTN  The breast sensitivity at the nipple has persisted slightly No longer having pain Can lie on that side now Probably at least 95% better  Hasn't checked his BP at home No problems with the medication Has noticed mild swelling yesterday--may have been from different shoes No chest pain No SOB No dizziness or orthostatic symptoms Still "gets exhausted pretty easy" He doesn't do any exercise  Current Outpatient Prescriptions on File Prior to Visit  Medication Sig Dispense Refill  . amLODipine (NORVASC) 2.5 MG tablet Take 1 tablet (2.5 mg total) by mouth daily. 30 tablet 3  . cetirizine (ZYRTEC) 10 MG tablet Take 1 tablet (10 mg total) by mouth daily. 90 tablet 3  . ferrous sulfate 325 (65 FE) MG tablet Take 3 tablets 3 times a day 270 tablet 3  . finasteride (PROSCAR) 5 MG tablet Take 5 mg by mouth daily.    . folic acid (FOLVITE) 1 MG tablet Take 1 mg by mouth daily.    Marland Kitchen losartan (COZAAR) 100 MG tablet Take 1 tablet (100 mg total) by mouth daily. 90 tablet 3  . methotrexate (RHEUMATREX) 2.5 MG tablet Take 10 tablets (25 mg total) by mouth once a week. Caution:Chemotherapy. Protect from light. 40 tablet 0  . omeprazole (PRILOSEC) 20 MG capsule Take 1 capsule (20 mg total) by mouth daily. 90 capsule 3  . pravastatin (PRAVACHOL) 40 MG tablet Take 1 tablet (40 mg total) by mouth daily. 90 tablet 3  . Cyanocobalamin (VITAMIN B-12 IJ) Inject 1,000 mcg as directed every 30 (thirty) days.      No current facility-administered medications on file prior to visit.    Allergies  Allergen Reactions  . Morphine And Related     Itching & rash Because of a history of documented adverse serious drug reaction;Medi Alert bracelet  is recommended    Past Medical History  Diagnosis Date  . GERD (gastroesophageal reflux disease)   . Hyperlipidemia   . Hypertension     . BPH (benign prostatic hyperplasia)   . RAD (reactive airway disease)     no PMH of asthma; RAD post infection  . Carpal tunnel syndrome on both sides   . Rheumatoid arthritis     Dr Ouida Sills    Past Surgical History  Procedure Laterality Date  . Colonoscopy w/ polypectomy  2009    X 2; Dr.Perry   . Vasectomy    . Upper gastrointestinal endoscopy       gastric polyp;Dr.Perry   . Lumbar laminectomy  07/2008  . Whipple procedure  2012    partial ; [place]  . Hernia repair      Family History  Problem Relation Age of Onset  . Throat cancer Father     smoker  . Esophageal cancer Father   . Prostate cancer Paternal Uncle   . Heart attack Paternal Uncle     3 uncles, >55  . Coronary artery disease Brother     in 1s  . Esophageal cancer Brother   . Skin cancer Mother   . Throat cancer Brother   . Uterine cancer Sister   . COPD Sister     X105  . Ovarian cancer Sister   . Stroke Paternal Grandmother 21  . Colon cancer Paternal Grandfather   . Heart disease Paternal Grandfather   .  Asthma Neg Hx   . Heart disease Maternal Grandfather   . Throat cancer Sister     smoker  . Lung cancer Sister     smoker  . Leukemia Sister     History   Social History  . Marital Status: Married    Spouse Name: N/A  . Number of Children: 2  . Years of Education: N/A   Occupational History  . 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 Smoking status: Former Smoker    Types: Cigarettes    Quit date: 07/08/1997  . Smokeless tobacco: Never Used     Comment: smoked age [age], up to 1 ppd  . Alcohol Use: No  . Drug Use: No  . Sexual Activity: Not on file   Other Topics Concern  . Not on file   Social History Narrative   No living will   Requests wife as health care POA--- then daughters   Would accept resuscitation attempts   Not sure about tube feeds   Review of Systems  Sleep is interrupted by frequent awakening-- able to get back to sleep.   Occasionally tired in AM but does okay during the day Appetite is good Weight stable     Objective:   Physical Exam  Constitutional: He appears well-developed and well-nourished. No distress.  Neck: Normal range of motion. Neck supple. No thyromegaly present.  Cardiovascular: Normal rate, regular rhythm and normal heart sounds.  Exam reveals no gallop.   No murmur heard. Pulmonary/Chest: Effort normal and breath sounds normal. No respiratory distress. He has no wheezes. He has no rales.  Musculoskeletal: He exhibits no edema.  Lymphadenopathy:    He has no cervical adenopathy.          Assessment & Plan:

## 2015-01-20 NOTE — Assessment & Plan Note (Signed)
BP Readings from Last 3 Encounters:  01/20/15 146/76  12/08/14 140/80  11/24/14 124/74   Breast symptoms better with stopping the spironolactone but BP up again He thinks he might be able to tolerate the HCTZ  Discussed options Will increase the amlodipine

## 2015-01-20 NOTE — Patient Instructions (Signed)
Please increase the amlodipine to 5mg  daily--take two of the 2.5mg  tabs until they run out.

## 2015-01-20 NOTE — Progress Notes (Signed)
Pre visit review using our clinic review tool, if applicable. No additional management support is needed unless otherwise documented below in the visit note. 

## 2015-02-15 DIAGNOSIS — N402 Nodular prostate without lower urinary tract symptoms: Secondary | ICD-10-CM | POA: Diagnosis not present

## 2015-02-15 DIAGNOSIS — N400 Enlarged prostate without lower urinary tract symptoms: Secondary | ICD-10-CM | POA: Diagnosis not present

## 2015-02-17 DIAGNOSIS — M1990 Unspecified osteoarthritis, unspecified site: Secondary | ICD-10-CM | POA: Diagnosis not present

## 2015-02-17 DIAGNOSIS — M2550 Pain in unspecified joint: Secondary | ICD-10-CM | POA: Diagnosis not present

## 2015-02-17 DIAGNOSIS — Z79899 Other long term (current) drug therapy: Secondary | ICD-10-CM | POA: Diagnosis not present

## 2015-02-17 DIAGNOSIS — M150 Primary generalized (osteo)arthritis: Secondary | ICD-10-CM | POA: Diagnosis not present

## 2015-03-14 DIAGNOSIS — H01009 Unspecified blepharitis unspecified eye, unspecified eyelid: Secondary | ICD-10-CM | POA: Diagnosis not present

## 2015-03-14 DIAGNOSIS — H2513 Age-related nuclear cataract, bilateral: Secondary | ICD-10-CM | POA: Diagnosis not present

## 2015-03-27 ENCOUNTER — Other Ambulatory Visit: Payer: Self-pay

## 2015-03-27 ENCOUNTER — Telehealth: Payer: Self-pay | Admitting: Internal Medicine

## 2015-03-27 DIAGNOSIS — K219 Gastro-esophageal reflux disease without esophagitis: Secondary | ICD-10-CM

## 2015-03-27 DIAGNOSIS — I10 Essential (primary) hypertension: Secondary | ICD-10-CM

## 2015-03-27 DIAGNOSIS — E785 Hyperlipidemia, unspecified: Secondary | ICD-10-CM

## 2015-03-27 DIAGNOSIS — J302 Other seasonal allergic rhinitis: Secondary | ICD-10-CM

## 2015-03-27 MED ORDER — OMEPRAZOLE 20 MG PO CPDR: 20.0000 mg | DELAYED_RELEASE_CAPSULE | Freq: Every day | ORAL | Status: DC

## 2015-03-27 MED ORDER — AMLODIPINE BESYLATE 5 MG PO TABS: 5.0000 mg | ORAL_TABLET | Freq: Every day | ORAL | Status: DC

## 2015-03-27 MED ORDER — LOSARTAN POTASSIUM 100 MG PO TABS: 100.0000 mg | ORAL_TABLET | Freq: Every day | ORAL | Status: DC

## 2015-03-27 MED ORDER — PRAVASTATIN SODIUM 40 MG PO TABS: 40.0000 mg | ORAL_TABLET | Freq: Every day | ORAL | Status: DC

## 2015-03-27 MED ORDER — CETIRIZINE HCL 10 MG PO TABS: 10.0000 mg | ORAL_TABLET | Freq: Every day | ORAL | Status: DC

## 2015-03-27 MED ORDER — FINASTERIDE 5 MG PO TABS: 5.0000 mg | ORAL_TABLET | Freq: Every day | ORAL | Status: DC

## 2015-03-27 NOTE — Telephone Encounter (Signed)
Left message on machine that rx is ready for pick-up, and it will be at our front desk.  

## 2015-03-27 NOTE — Telephone Encounter (Signed)
Pt gets 90 day rx with years refill of meds filled at [facility] and plans to go there the end of this week; pt request printed rx for amlodipine,zyrtec, finasteride, losartan, omeprazole and pravastatin. Pt request cb when ready for pick up.pt last seen 01/20/15. 04/18/15 scheduled for medicare wellness.

## 2015-03-28 MED ORDER — FERROUS SULFATE 325 (65 FE) MG PO TABS: ORAL_TABLET | ORAL | Status: DC

## 2015-03-28 NOTE — Telephone Encounter (Signed)
Printed out requested rx.  No answer and no voicemail picked up.  Need to know whether to mail it or put it up front.  Will try again later.

## 2015-03-28 NOTE — Telephone Encounter (Signed)
Patient states that he will pick up rx this afternoon.

## 2015-03-28 NOTE — Telephone Encounter (Signed)
Patient picked up ferrous sulfate rx

## 2015-03-29 DIAGNOSIS — H01009 Unspecified blepharitis unspecified eye, unspecified eyelid: Secondary | ICD-10-CM | POA: Diagnosis not present

## 2015-04-03 ENCOUNTER — Encounter: Payer: Self-pay | Admitting: Internal Medicine

## 2015-04-03 ENCOUNTER — Ambulatory Visit (INDEPENDENT_AMBULATORY_CARE_PROVIDER_SITE_OTHER): Payer: Medicare Other | Admitting: Internal Medicine

## 2015-04-03 VITALS — BP 156/82 | HR 69 | Temp 97.9°F | Wt 186.5 lb

## 2015-04-03 DIAGNOSIS — Z23 Encounter for immunization: Secondary | ICD-10-CM

## 2015-04-03 DIAGNOSIS — L255 Unspecified contact dermatitis due to plants, except food: Secondary | ICD-10-CM

## 2015-04-03 MED ORDER — PREDNISONE 10 MG PO TABS: ORAL_TABLET | ORAL | Status: DC

## 2015-04-03 MED ORDER — METHYLPREDNISOLONE ACETATE 80 MG/ML IJ SUSP
80.0000 mg | Freq: Once | INTRAMUSCULAR | Status: AC
  Administered 2015-04-03: 80 mg via INTRAMUSCULAR

## 2015-04-03 NOTE — Addendum Note (Signed)
Addended by: Modena Nunnery on: 04/03/2015 10:31 AM   Modules accepted: Orders

## 2015-04-03 NOTE — Progress Notes (Signed)
Pre visit review using our clinic review tool, if applicable. No additional management support is needed unless otherwise documented below in the visit note. 

## 2015-04-03 NOTE — Progress Notes (Signed)
Subjective:    Patient ID: Thomas Hudson, male    DOB: 03-Jun-1941, 74 y.o.   MRN: [id number]  HPI  Pt presents to the clinic today with c/o a rash on his face. He noticed this 2 days ago after he was working out in the yard. The rash started on his face but has now popped up on his left arm. It is very itchy. He thinks it is poison ivy. He has not changed soaps, lotions or detergents. He did put a warm compress on his face but he has not tried anything OTC.  Review of Systems      Past Medical History  Diagnosis Date  . GERD (gastroesophageal reflux disease)   . Hyperlipidemia   . Hypertension   . BPH (benign prostatic hyperplasia)   . RAD (reactive airway disease)     no PMH of asthma; RAD post infection  . Carpal tunnel syndrome on both sides   . Rheumatoid arthritis     Dr Ouida Sills    Current Outpatient Prescriptions  Medication Sig Dispense Refill  . amLODipine (NORVASC) 5 MG tablet Take 1 tablet (5 mg total) by mouth daily. 90 tablet 3  . cetirizine (ZYRTEC) 10 MG tablet Take 1 tablet (10 mg total) by mouth daily. 90 tablet 3  . Cyanocobalamin (VITAMIN B-12 IJ) Inject 1,000 mcg as directed every 30 (thirty) days.     . ferrous sulfate 325 (65 FE) MG tablet Take 3 tablets 3 times a day 270 tablet 3  . finasteride (PROSCAR) 5 MG tablet Take 1 tablet (5 mg total) by mouth daily. 90 tablet 3  . folic acid (FOLVITE) 1 MG tablet Take 1 mg by mouth daily.    Marland Kitchen losartan (COZAAR) 100 MG tablet Take 1 tablet (100 mg total) by mouth daily. 90 tablet 3  . methotrexate (RHEUMATREX) 2.5 MG tablet Take 10 tablets (25 mg total) by mouth once a week. Caution:Chemotherapy. Protect from light. 40 tablet 0  . omeprazole (PRILOSEC) 20 MG capsule Take 1 capsule (20 mg total) by mouth daily. 90 capsule 3  . pravastatin (PRAVACHOL) 40 MG tablet Take 1 tablet (40 mg total) by mouth daily. 90 tablet 3   No current facility-administered medications for this visit.    Allergies  Allergen  Reactions  . Morphine And Related     Itching & rash Because of a history of documented adverse serious drug reaction;Medi Alert bracelet  is recommended    Family History  Problem Relation Age of Onset  . Throat cancer Father     smoker  . Esophageal cancer Father   . Prostate cancer Paternal Uncle   . Heart attack Paternal Uncle     3 uncles, >55  . Coronary artery disease Brother     in 48s  . Esophageal cancer Brother   . Skin cancer Mother   . Throat cancer Brother   . Uterine cancer Sister   . COPD Sister     X56  . Ovarian cancer Sister   . Stroke Paternal Grandmother 76  . Colon cancer Paternal Grandfather   . Heart disease Paternal Grandfather   . Asthma Neg Hx   . Heart disease Maternal Grandfather   . Throat cancer Sister     smoker  . Lung cancer Sister     smoker  . Leukemia Sister     Social History   Social History  . Marital Status: Married    Spouse Name: N/A  . Number  of Children: 2  . Years of Education: N/A   Occupational History  . 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 Smoking status: Former Smoker    Types: Cigarettes    Quit date: 07/08/1997  . Smokeless tobacco: Never Used     Comment: smoked age [age], up to 1 ppd  . Alcohol Use: No  . Drug Use: No  . Sexual Activity: Not on file   Other Topics Concern  . Not on file   Social History Narrative   No living will   Requests wife as health care POA--- then daughters   Would accept resuscitation attempts   Not sure about tube feeds     Constitutional: Denies fever, malaise, fatigue, headache or abrupt weight changes.  Respiratory: Denies difficulty breathing, shortness of breath, cough or sputum production.   Cardiovascular: Denies chest pain, chest tightness, palpitations or swelling in the hands or feet.  Skin: Pt reports rash. Denies ulcercations.   No other specific complaints in a complete review of systems (except as listed in HPI  above).  Objective:   Physical Exam   BP 156/82 mmHg  Pulse 69  Temp(Src) 97.9 F (36.6 C) (Oral)  Wt 186 lb 8 oz (84.596 kg)  SpO2 96% Wt Readings from Last 3 Encounters:  04/03/15 186 lb 8 oz (84.596 kg)  01/20/15 187 lb 6.4 oz (85.004 kg)  12/08/14 187 lb (84.823 kg)    General: Appears his stated age, well developed, well nourished in NAD. Skin: Scattered maculopapular lesions on erythematous base noted on face. Similar lesions in a linear form noted on left forearm. Cardiovascular: Normal rate and rhythm.  Pulmonary/Chest: Normal effort and positive vesicular breath sounds. No respiratory distress. No wheezes, rales or ronchi noted.   BMET    Component Value Date/Time   NA 137 04/04/2014 1047   K 3.3* 04/04/2014 1047   CL 102 04/04/2014 1047   CO2 26 04/04/2014 1047   GLUCOSE 91 04/04/2014 1047   BUN 10 04/04/2014 1047   CREATININE 1.2 04/04/2014 1047   CALCIUM 8.8 04/04/2014 1047   GFRNONAA >60 07/11/2008 0905   GFRAA  07/11/2008 0905    >60        The eGFR has been calculated using the MDRD equation. This calculation has not been validated in all clinical situations. eGFR's persistently <60 mL/min signify possible Chronic Kidney Disease.    Lipid Panel     Component Value Date/Time   CHOL 163 04/04/2014 1047   TRIG 127.0 04/04/2014 1047   HDL 37.50* 04/04/2014 1047   CHOLHDL 4 04/04/2014 1047   VLDL 25.4 04/04/2014 1047   LDLCALC 100* 04/04/2014 1047    CBC    Component Value Date/Time   WBC 6.8 04/04/2014 1047   RBC 4.63 04/04/2014 1047   HGB 14.6 04/04/2014 1047   HCT 43.9 04/04/2014 1047   PLT 286.0 04/04/2014 1047   MCV 95.0 04/04/2014 1047   MCHC 33.2 04/04/2014 1047   RDW 15.5 04/04/2014 1047   LYMPHSABS 0.7 04/04/2014 1047   MONOABS 0.8 04/04/2014 1047   EOSABS 0.1 04/04/2014 1047   BASOSABS 0.0 04/04/2014 1047    Hgb A1C Lab Results  Component Value Date   HGBA1C 5.7 04/04/2014        Assessment & Plan:   Contact  Dermatitis due to plant:  80 mg Depo IM today eRx for Pred Taper (he is aware to stom MTX while on Prednisone) Ok to   use low dose Benadryl as needed for itching  RTC as needed or if symptoms persist or worsen

## 2015-04-03 NOTE — Patient Instructions (Signed)

## 2015-04-07 ENCOUNTER — Other Ambulatory Visit: Payer: Medicare Other

## 2015-04-10 ENCOUNTER — Ambulatory Visit (INDEPENDENT_AMBULATORY_CARE_PROVIDER_SITE_OTHER): Payer: Medicare Other | Admitting: Internal Medicine

## 2015-04-10 ENCOUNTER — Encounter: Payer: Self-pay | Admitting: Internal Medicine

## 2015-04-10 VITALS — BP 130/60 | HR 73 | Temp 97.4°F | Wt 187.0 lb

## 2015-04-10 DIAGNOSIS — L259 Unspecified contact dermatitis, unspecified cause: Secondary | ICD-10-CM | POA: Diagnosis not present

## 2015-04-10 MED ORDER — TRIAMCINOLONE ACETONIDE 0.1 % EX CREA: 1.0000 "application " | TOPICAL_CREAM | Freq: Two times a day (BID) | CUTANEOUS | Status: DC | PRN

## 2015-04-10 NOTE — Assessment & Plan Note (Signed)
No new exposures Finished the prednisone--then wore suit yesterday (skin warmer then) Seems to have mild recurrence Will just treat with steroid cream

## 2015-04-10 NOTE — Progress Notes (Signed)
Subjective:    Patient ID: Thomas Hudson, male    DOB: [birth year], 74 y.o.   MRN: [id number]  HPI Here due to recurrence of rash  The rash had been drying up Then all of a sudden, he broke out again on his arms (had been on face before) It is itchy Took last prednisone 2 days  No fever but had slight chill last night No possible reexposure to plant oil (if indeed that is what happened before)  Current Outpatient Prescriptions on File Prior to Visit  Medication Sig Dispense Refill  . amLODipine (NORVASC) 5 MG tablet Take 1 tablet (5 mg total) by mouth daily. 90 tablet 3  . cetirizine (ZYRTEC) 10 MG tablet Take 1 tablet (10 mg total) by mouth daily. 90 tablet 3  . Cyanocobalamin (VITAMIN B-12 IJ) Inject 1,000 mcg as directed every 30 (thirty) days.     . ferrous sulfate 325 (65 FE) MG tablet Take 3 tablets 3 times a day 270 tablet 3  . finasteride (PROSCAR) 5 MG tablet Take 1 tablet (5 mg total) by mouth daily. 90 tablet 3  . folic acid (FOLVITE) 1 MG tablet Take 1 mg by mouth daily.    Marland Kitchen losartan (COZAAR) 100 MG tablet Take 1 tablet (100 mg total) by mouth daily. 90 tablet 3  . methotrexate (RHEUMATREX) 2.5 MG tablet Take 10 tablets (25 mg total) by mouth once a week. Caution:Chemotherapy. Protect from light. 40 tablet 0  . omeprazole (PRILOSEC) 20 MG capsule Take 1 capsule (20 mg total) by mouth daily. 90 capsule 3  . pravastatin (PRAVACHOL) 40 MG tablet Take 1 tablet (40 mg total) by mouth daily. 90 tablet 3   No current facility-administered medications on file prior to visit.    Allergies  Allergen Reactions  . Morphine And Related     Itching & rash Because of a history of documented adverse serious drug reaction;Medi Alert bracelet  is recommended    Past Medical History  Diagnosis Date  . GERD (gastroesophageal reflux disease)   . Hyperlipidemia   . Hypertension   . BPH (benign prostatic hyperplasia)   . RAD (reactive airway disease)     no PMH of asthma; RAD post  infection  . Carpal tunnel syndrome on both sides   . Rheumatoid arthritis ([place])     Dr Ouida Sills    Past Surgical History  Procedure Laterality Date  . Colonoscopy w/ polypectomy  2009    X 2; Dr.Perry   . Vasectomy    . Upper gastrointestinal endoscopy       gastric polyp;Dr.Perry   . Lumbar laminectomy  07/2008  . Whipple procedure  2012    partial ; [place]  . Hernia repair      Family History  Problem Relation Age of Onset  . Throat cancer Father     smoker  . Esophageal cancer Father   . Prostate cancer Paternal Uncle   . Heart attack Paternal Uncle     3 uncles, >55  . Coronary artery disease Brother     in 53s  . Esophageal cancer Brother   . Skin cancer Mother   . Throat cancer Brother   . Uterine cancer Sister   . COPD Sister     X31  . Ovarian cancer Sister   . Stroke Paternal Grandmother 15  . Colon cancer Paternal Grandfather   . Heart disease Paternal Grandfather   . Asthma Neg Hx   . Heart disease Maternal  Grandfather   . Throat cancer Sister     smoker  . Lung cancer Sister     smoker  . Leukemia Sister     Social History   Social History  . Marital Status: Married    Spouse Name: N/A  . Number of Children: 2  . Years of Education: N/A   Occupational History  . 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 Smoking status: Former Smoker    Types: Cigarettes    Quit date: 07/08/1997  . Smokeless tobacco: Never Used     Comment: smoked age [age], up to 1 ppd  . Alcohol Use: No  . Drug Use: No  . Sexual Activity: Not on file   Other Topics Concern  . Not on file   Social History Narrative   No living will   Requests wife as health care POA--- then daughters   Would accept resuscitation attempts   Not sure about tube feeds   Review of Systems  No GI problems  Appetite is fine     Objective:   Physical Exam  Constitutional: He appears well-developed and well-nourished. No distress.  Skin:  Scattered crops of  papules mostly on flexor left>right arm and forearms          Assessment & Plan:

## 2015-04-18 ENCOUNTER — Ambulatory Visit (INDEPENDENT_AMBULATORY_CARE_PROVIDER_SITE_OTHER): Payer: Medicare Other | Admitting: Internal Medicine

## 2015-04-18 ENCOUNTER — Encounter: Payer: Self-pay | Admitting: Internal Medicine

## 2015-04-18 VITALS — BP 152/70 | HR 72 | Temp 98.4°F | Ht 66.0 in | Wt 184.0 lb

## 2015-04-18 DIAGNOSIS — E785 Hyperlipidemia, unspecified: Secondary | ICD-10-CM | POA: Diagnosis not present

## 2015-04-18 DIAGNOSIS — J301 Allergic rhinitis due to pollen: Secondary | ICD-10-CM

## 2015-04-18 DIAGNOSIS — M069 Rheumatoid arthritis, unspecified: Secondary | ICD-10-CM

## 2015-04-18 DIAGNOSIS — I10 Essential (primary) hypertension: Secondary | ICD-10-CM | POA: Diagnosis not present

## 2015-04-18 DIAGNOSIS — Z0000 Encounter for general adult medical examination without abnormal findings: Secondary | ICD-10-CM

## 2015-04-18 DIAGNOSIS — N400 Enlarged prostate without lower urinary tract symptoms: Secondary | ICD-10-CM

## 2015-04-18 DIAGNOSIS — Z01811 Encounter for preprocedural respiratory examination: Secondary | ICD-10-CM | POA: Insufficient documentation

## 2015-04-18 DIAGNOSIS — Z7189 Other specified counseling: Secondary | ICD-10-CM

## 2015-04-18 LAB — LIPID PANEL
Cholesterol: 159 mg/dL (ref 0–200)
HDL: 41.8 mg/dL (ref >39.00)
LDL Cholesterol: 97 mg/dL (ref 0–99)
NONHDL: 116.75
Total CHOL/HDL Ratio: 4
Triglycerides: 99 mg/dL (ref 0.0–149.0)
VLDL: 19.8 mg/dL (ref 0.0–40.0)

## 2015-04-18 LAB — COMPREHENSIVE METABOLIC PANEL
ALT: 19 U/L (ref 0–53)
AST: 20 U/L (ref 0–37)
Albumin: 3.9 g/dL (ref 3.5–5.2)
Alkaline Phosphatase: 63 U/L (ref 39–117)
BUN: 11 mg/dL (ref 6–23)
CALCIUM: 9.1 mg/dL (ref 8.4–10.5)
CHLORIDE: 109 meq/L (ref 96–112)
CO2: 28 meq/L (ref 19–32)
Creatinine, Ser: 1.03 mg/dL (ref 0.40–1.50)
GFR: 75.39 mL/min (ref >60.00)
Glucose, Bld: 77 mg/dL (ref 70–99)
POTASSIUM: 3.4 meq/L — AB (ref 3.5–5.1)
Sodium: 144 mEq/L (ref 135–145)
Total Bilirubin: 0.9 mg/dL (ref 0.2–1.2)
Total Protein: 6.4 g/dL (ref 6.0–8.3)

## 2015-04-18 LAB — CBC WITH DIFFERENTIAL/PLATELET
BASOS PCT: 0.1 % (ref 0.0–3.0)
Basophils Absolute: 0 10*3/uL (ref 0.0–0.1)
EOS PCT: 1.7 % (ref 0.0–5.0)
Eosinophils Absolute: 0.1 10*3/uL (ref 0.0–0.7)
HEMATOCRIT: 41.5 % (ref 39.0–52.0)
Hemoglobin: 13.7 g/dL (ref 13.0–17.0)
LYMPHS PCT: 8.3 % — AB (ref 12.0–46.0)
Lymphs Abs: 0.6 10*3/uL — ABNORMAL LOW (ref 0.7–4.0)
MCHC: 33 g/dL (ref 30.0–36.0)
MCV: 92.9 fl (ref 78.0–100.0)
MONOS PCT: 9.2 % (ref 3.0–12.0)
Monocytes Absolute: 0.7 10*3/uL (ref 0.1–1.0)
NEUTROS ABS: 6 10*3/uL (ref 1.4–7.7)
Neutrophils Relative %: 80.7 % — ABNORMAL HIGH (ref 43.0–77.0)
PLATELETS: 248 10*3/uL (ref 150.0–400.0)
RBC: 4.47 Mil/uL (ref 4.22–5.81)
RDW: 15 % (ref 11.5–15.5)
WBC: 7.4 10*3/uL (ref 4.0–10.5)

## 2015-04-18 NOTE — Assessment & Plan Note (Signed)
Controlled with finasteride

## 2015-04-18 NOTE — Assessment & Plan Note (Signed)
On statin for primary prevention

## 2015-04-18 NOTE — Assessment & Plan Note (Signed)
BP Readings from Last 3 Encounters:  04/18/15 152/70  04/10/15 130/60  04/03/15 156/82   Has usually been okay No change for now unless consistently up

## 2015-04-18 NOTE — Progress Notes (Signed)
Subjective:    Patient ID: Thomas Hudson, male    DOB: [birth year], 74 y.o.   MRN: [id number]  HPI Here for Medicare wellness and follow up of chronic medical problems Reviewed form and advanced directives Reviewed other doctors No tobacco or alcohol Vision has changed--but okay. Mild hearing problems Tries to exercise some No falls  No depression or anhedonia Independent with instrumental ADLs No significant cognitive changes  Having persistent drainage and cough at night Going on 2 months Does use flonase and zyrtec --though inconsistent with flonase Discussed using this daily  RA has been controlled Hands and feet were mostly affected---now can use his hands fully On MTX and folic acid  Still sees Dr Junious Silk for BPH On finasteride Voids okay Nocturia x 1 at most No daytime symptoms  Takes the omeprazole daily Has tried weaning off--- after 2 days he will get bad symptoms again Occasional dysphagia--for food Has had esophageal dilation in past  No chest pain Some stable DOE-- hills or steps. This seems stable No dizziness or syncope No edema  Current Outpatient Prescriptions on File Prior to Visit  Medication Sig Dispense Refill  . amLODipine (NORVASC) 5 MG tablet Take 1 tablet (5 mg total) by mouth daily. 90 tablet 3  . cetirizine (ZYRTEC) 10 MG tablet Take 1 tablet (10 mg total) by mouth daily. 90 tablet 3  . Cyanocobalamin (VITAMIN B-12 IJ) Inject 1,000 mcg as directed every 30 (thirty) days.     . ferrous sulfate 325 (65 FE) MG tablet Take 3 tablets 3 times a day 270 tablet 3  . finasteride (PROSCAR) 5 MG tablet Take 1 tablet (5 mg total) by mouth daily. 90 tablet 3  . folic acid (FOLVITE) 1 MG tablet Take 1 mg by mouth daily.    Marland Kitchen losartan (COZAAR) 100 MG tablet Take 1 tablet (100 mg total) by mouth daily. 90 tablet 3  . methotrexate (RHEUMATREX) 2.5 MG tablet Take 10 tablets (25 mg total) by mouth once a week. Caution:Chemotherapy. Protect from light. 40  tablet 0  . omeprazole (PRILOSEC) 20 MG capsule Take 1 capsule (20 mg total) by mouth daily. 90 capsule 3  . pravastatin (PRAVACHOL) 40 MG tablet Take 1 tablet (40 mg total) by mouth daily. 90 tablet 3  . triamcinolone cream (KENALOG) 0.1 % Apply 1 application topically 2 (two) times daily as needed. 45 g 1   No current facility-administered medications on file prior to visit.    Allergies  Allergen Reactions  . Morphine And Related     Itching & rash Because of a history of documented adverse serious drug reaction;Medi Alert bracelet  is recommended    Past Medical History  Diagnosis Date  . GERD (gastroesophageal reflux disease)   . Hyperlipidemia   . Hypertension   . BPH (benign prostatic hyperplasia)   . RAD (reactive airway disease)     no PMH of asthma; RAD post infection  . Carpal tunnel syndrome on both sides   . Rheumatoid arthritis ([place])     Dr Ouida Sills    Past Surgical History  Procedure Laterality Date  . Colonoscopy w/ polypectomy  2009    X 2; Dr.Perry   . Vasectomy    . Upper gastrointestinal endoscopy       gastric polyp;Dr.Perry   . Lumbar laminectomy  07/2008  . Whipple procedure  2012    partial ; [facility]  . Hernia repair      Family History  Problem Relation Age  of Onset  . Throat cancer Father     smoker  . Esophageal cancer Father   . Prostate cancer Paternal Uncle   . Heart attack Paternal Uncle     3 uncles, >55  . Coronary artery disease Brother     in 48s  . Esophageal cancer Brother   . Skin cancer Mother   . Throat cancer Brother   . Uterine cancer Sister   . COPD Sister     X12  . Ovarian cancer Sister   . Stroke Paternal Grandmother 36  . Colon cancer Paternal Grandfather   . Heart disease Paternal Grandfather   . Asthma Neg Hx   . Heart disease Maternal Grandfather   . Throat cancer Sister     smoker  . Lung cancer Sister     smoker  . Leukemia Sister     Social History   Social History  . Marital Status: Married     Spouse Name: N/A  . Number of Children: 2  . Years of Education: N/A   Occupational History  . 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 Smoking status: Former Smoker    Types: Cigarettes    Quit date: 07/08/1997  . Smokeless tobacco: Never Used     Comment: smoked age [age], up to 1 ppd  . Alcohol Use: No  . Drug Use: No  . Sexual Activity: Not on file   Other Topics Concern  . Not on file   Social History Narrative   No living will   Requests wife as health care POA--- then daughters   Would accept resuscitation attempts   Not sure about tube feeds   Review of Systems Rash is better  Wears seat belt Appetite is good Weight stable Back gets "tired" quick--just needs rest at times No suspicious skin lesions. Did have upper chest lesion removed by derm years ago Bowels are okay--recent colonoscopy. Some constipation from the iron    Objective:   Physical Exam  Constitutional: He is oriented to person, place, and time. He appears well-developed and well-nourished. No distress.  HENT:  Mouth/Throat: Oropharynx is clear and moist. No oropharyngeal exudate.  Neck: Normal range of motion. Neck supple. No thyromegaly present.  Cardiovascular: Normal rate, regular rhythm, normal heart sounds and intact distal pulses.  Exam reveals no gallop.   No murmur heard. Pulmonary/Chest: Effort normal and breath sounds normal. No respiratory distress. He has no wheezes. He has no rales.  Abdominal: Soft. There is no tenderness.  Musculoskeletal: He exhibits no edema or tenderness.  PIPs thick but not inflamed  Lymphadenopathy:    He has no cervical adenopathy.  Neurological: He is alert and oriented to person, place, and time.  President-- "Obama, Anne Hahn--- (then) Clinton" 100-90......."bad at math" D-[id number] Recall 3/3  Skin: No rash noted. No erythema.  Psychiatric: He has a normal mood and affect. His behavior is normal.          Assessment &  Plan:

## 2015-04-18 NOTE — Assessment & Plan Note (Signed)
Controlled with the MTX  

## 2015-04-18 NOTE — Assessment & Plan Note (Signed)
I have personally reviewed the Medicare Annual Wellness questionnaire and have noted 1. The patient's medical and social history 2. Their use of alcohol, tobacco or illicit drugs 3. Their current medications and supplements 4. The patient's functional ability including ADL's, fall risks, home safety risks and hearing or visual             impairment. 5. Diet and physical activities 6. Evidence for depression or mood disorders  The patients weight, height, BMI and visual acuity have been recorded in the chart I have made referrals, counseling and provided education to the patient based review of the above and I have provided the pt with a written personalized care plan for preventive services.  I have provided you with a copy of your personalized plan for preventive services. Please take the time to review along with your updated medication list.  UTD on immunizations Recommended no more PSAs Just had colon--done with screening Discussed fitness

## 2015-04-18 NOTE — Assessment & Plan Note (Signed)
See social history Forms given 

## 2015-04-18 NOTE — Progress Notes (Signed)
Pre visit review using our clinic review tool, if applicable. No additional management support is needed unless otherwise documented below in the visit note. 

## 2015-04-18 NOTE — Assessment & Plan Note (Signed)
Needs to be more consistent with the flonase

## 2015-05-16 DIAGNOSIS — M1990 Unspecified osteoarthritis, unspecified site: Secondary | ICD-10-CM | POA: Diagnosis not present

## 2015-05-16 DIAGNOSIS — Z79899 Other long term (current) drug therapy: Secondary | ICD-10-CM | POA: Diagnosis not present

## 2015-06-29 DIAGNOSIS — M150 Primary generalized (osteo)arthritis: Secondary | ICD-10-CM | POA: Diagnosis not present

## 2015-06-29 DIAGNOSIS — M2550 Pain in unspecified joint: Secondary | ICD-10-CM | POA: Diagnosis not present

## 2015-06-29 DIAGNOSIS — Z79899 Other long term (current) drug therapy: Secondary | ICD-10-CM | POA: Diagnosis not present

## 2015-06-29 DIAGNOSIS — M1990 Unspecified osteoarthritis, unspecified site: Secondary | ICD-10-CM | POA: Diagnosis not present

## 2015-07-06 ENCOUNTER — Encounter: Payer: Self-pay | Admitting: *Deleted

## 2015-08-02 ENCOUNTER — Ambulatory Visit: Payer: Medicare Other | Admitting: Family Medicine

## 2015-08-30 ENCOUNTER — Ambulatory Visit (INDEPENDENT_AMBULATORY_CARE_PROVIDER_SITE_OTHER): Payer: Medicare Other | Admitting: Family Medicine

## 2015-08-30 ENCOUNTER — Encounter: Payer: Self-pay | Admitting: Family Medicine

## 2015-08-30 VITALS — BP 138/74 | HR 82 | Temp 99.3°F | Ht 66.0 in | Wt 186.0 lb

## 2015-08-30 DIAGNOSIS — J019 Acute sinusitis, unspecified: Secondary | ICD-10-CM | POA: Diagnosis not present

## 2015-08-30 MED ORDER — CEFUROXIME AXETIL 500 MG PO TABS: 500.0000 mg | ORAL_TABLET | Freq: Two times a day (BID) | ORAL | Status: DC

## 2015-08-30 MED ORDER — HYDROCODONE-HOMATROPINE 5-1.5 MG/5ML PO SYRP: 5.0000 mL | ORAL_SOLUTION | ORAL | Status: DC | PRN

## 2015-08-30 NOTE — Progress Notes (Signed)
Pre visit review using our clinic review tool, if applicable. No additional management support is needed unless otherwise documented below in the visit note. 

## 2015-08-30 NOTE — Progress Notes (Signed)
   Subjective:    Patient ID: Thomas Hudson, male    DOB: Mar 22, 1941, 75 y.o.   MRN: [id number]  HPI Here for 2 weeks of sinus pressure, PND, chest congestion and coughing up green sputum. No fever. On Mucinex.   Review of Systems  Constitutional: Negative.   HENT: Positive for congestion, postnasal drip and sinus pressure. Negative for sore throat.   Eyes: Negative.   Respiratory: Positive for cough.        Objective:   Physical Exam  Constitutional: He appears well-developed and well-nourished.  HENT:  Right Ear: External ear normal.  Left Ear: External ear normal.  Nose: Nose normal.  Mouth/Throat: Oropharynx is clear and moist.  Eyes: Conjunctivae are normal.  Neck: No thyromegaly present.  Pulmonary/Chest: Effort normal and breath sounds normal.  Lymphadenopathy:    He has no cervical adenopathy.          Assessment & Plan:  Sinusitis, treat with Ceftin.

## 2015-09-15 ENCOUNTER — Telehealth: Payer: Self-pay | Admitting: Internal Medicine

## 2015-09-15 NOTE — Telephone Encounter (Signed)
Pt has appt at [facility] 09/16/15 at 9:30 with Raiford Noble PA.

## 2015-09-15 NOTE — Telephone Encounter (Signed)
Patient Name: Thomas Hudson DOB: Nov 06, 1940 Initial Comment Caller states husband has a cold- leaving to go out of town on Sunday- Nurse Assessment Nurse: Ronnald Ramp, RN, Miranda Date/Time (Eastern Time): 09/15/2015 11:55:31 AM Confirm and document reason for call. If symptomatic, describe symptoms. You must click the next button to save text entered. ---Spoke with pt, he has been having symptoms of sinus infection for 2 months. He has been treated with antibiotics but still has congestion and cough. Temp 99 this morning. Has the patient traveled out of the country within the last 30 days? ---No Does the patient have any new or worsening symptoms? ---Yes Will a triage be completed? ---Yes Related visit to physician within the last 2 weeks? ---Yes Does the PT have any chronic conditions? (i.e. diabetes, asthma, etc.) ---Yes List chronic conditions. ---HTN, High Cholesterol, Prostate. Is this a behavioral health or substance abuse call? ---No Guidelines Guideline Title Affirmed Question Affirmed Notes Sinus Pain or Congestion [1] Sinus congestion (pressure, fullness) AND [2] present > 10 days Final Disposition User See PCP When Office is Open (within 3 days) Ronnald Ramp, Therapist, sports, Miranda Comments No appts available with PCP of PCP office for today. Sat schedule is not open yet. Will contact caller later to set up appt at [facility] clinic. Appt scheduled for tomorrow 09/16/15 at [place] at 9:30a with Elyn Aquas, PA Referrals [facility] Disagree/Comply: Comply

## 2015-09-16 ENCOUNTER — Encounter: Payer: Self-pay | Admitting: Physician Assistant

## 2015-09-16 ENCOUNTER — Ambulatory Visit (INDEPENDENT_AMBULATORY_CARE_PROVIDER_SITE_OTHER): Payer: Medicare Other | Admitting: Physician Assistant

## 2015-09-16 VITALS — BP 158/70 | HR 104 | Temp 98.0°F | Resp 18 | Ht 66.0 in | Wt 182.0 lb

## 2015-09-16 DIAGNOSIS — J019 Acute sinusitis, unspecified: Secondary | ICD-10-CM | POA: Diagnosis not present

## 2015-09-16 DIAGNOSIS — B9689 Other specified bacterial agents as the cause of diseases classified elsewhere: Secondary | ICD-10-CM

## 2015-09-16 MED ORDER — METHYLPREDNISOLONE ACETATE 80 MG/ML IJ SUSP
80.0000 mg | Freq: Once | INTRAMUSCULAR | Status: AC
  Administered 2015-09-16: 80 mg via INTRAMUSCULAR

## 2015-09-16 MED ORDER — BENZONATATE 100 MG PO CAPS: 100.0000 mg | ORAL_CAPSULE | Freq: Two times a day (BID) | ORAL | Status: DC | PRN

## 2015-09-16 MED ORDER — DOXYCYCLINE HYCLATE 100 MG PO TABS: 100.0000 mg | ORAL_TABLET | Freq: Two times a day (BID) | ORAL | Status: DC

## 2015-09-16 NOTE — Patient Instructions (Signed)
Please take antibiotic as directed.  Increase fluid intake.  Use Saline nasal spray.  Take a daily multivitamin. Continue allergy medication for cough. Use cough syrup as directed.  Place a humidifier in the bedroom.  Please call or return clinic if symptoms are not improving.  Sinusitis Sinusitis is redness, soreness, and swelling (inflammation) of the paranasal sinuses. Paranasal sinuses are air pockets within the bones of your face (beneath the eyes, the middle of the forehead, or above the eyes). In healthy paranasal sinuses, mucus is able to drain out, and air is able to circulate through them by way of your nose. However, when your paranasal sinuses are inflamed, mucus and air can become trapped. This can allow bacteria and other germs to grow and cause infection. Sinusitis can develop quickly and last only a short time (acute) or continue over a long period (chronic). Sinusitis that lasts for more than 12 weeks is considered chronic.  CAUSES  Causes of sinusitis include:  Allergies.  Structural abnormalities, such as displacement of the cartilage that separates your nostrils (deviated septum), which can decrease the air flow through your nose and sinuses and affect sinus drainage.  Functional abnormalities, such as when the small hairs (cilia) that line your sinuses and help remove mucus do not work properly or are not present. SYMPTOMS  Symptoms of acute and chronic sinusitis are the same. The primary symptoms are pain and pressure around the affected sinuses. Other symptoms include:  Upper toothache.  Earache.  Headache.  Bad breath.  Decreased sense of smell and taste.  A cough, which worsens when you are lying flat.  Fatigue.  Fever.  Thick drainage from your nose, which often is green and may contain pus (purulent).  Swelling and warmth over the affected sinuses. DIAGNOSIS  Your caregiver will perform a physical exam. During the exam, your caregiver may:  Look in  your nose for signs of abnormal growths in your nostrils (nasal polyps).  Tap over the affected sinus to check for signs of infection.  View the inside of your sinuses (endoscopy) with a special imaging device with a light attached (endoscope), which is inserted into your sinuses. If your caregiver suspects that you have chronic sinusitis, one or more of the following tests may be recommended:  Allergy tests.  Nasal culture A sample of mucus is taken from your nose and sent to a lab and screened for bacteria.  Nasal cytology A sample of mucus is taken from your nose and examined by your caregiver to determine if your sinusitis is related to an allergy. TREATMENT  Most cases of acute sinusitis are related to a viral infection and will resolve on their own within 10 days. Sometimes medicines are prescribed to help relieve symptoms (pain medicine, decongestants, nasal steroid sprays, or saline sprays).  However, for sinusitis related to a bacterial infection, your caregiver will prescribe antibiotic medicines. These are medicines that will help kill the bacteria causing the infection.  Rarely, sinusitis is caused by a fungal infection. In theses cases, your caregiver will prescribe antifungal medicine. For some cases of chronic sinusitis, surgery is needed. Generally, these are cases in which sinusitis recurs more than 3 times per year, despite other treatments. HOME CARE INSTRUCTIONS   Drink plenty of water. Water helps thin the mucus so your sinuses can drain more easily.  Use a humidifier.  Inhale steam 3 to 4 times a day (for example, sit in the bathroom with the shower running).  Apply a warm, moist  washcloth to your face 3 to 4 times a day, or as directed by your caregiver.  Use saline nasal sprays to help moisten and clean your sinuses.  Take over-the-counter or prescription medicines for pain, discomfort, or fever only as directed by your caregiver. SEEK IMMEDIATE MEDICAL CARE  IF:  You have increasing pain or severe headaches.  You have nausea, vomiting, or drowsiness.  You have swelling around your face.  You have vision problems.  You have a stiff neck.  You have difficulty breathing. MAKE SURE YOU:   Understand these instructions.  Will watch your condition.  Will get help right away if you are not doing well or get worse. Document Released: 06/24/2005 Document Revised: 09/16/2011 Document Reviewed: 07/09/2011 [facility] Patient Information 2014 [place], [place].

## 2015-09-16 NOTE — Progress Notes (Signed)
Pre visit review using our clinic review tool, if applicable. No additional management support is needed unless otherwise documented below in the visit note. 

## 2015-09-16 NOTE — Progress Notes (Signed)
Patient presents to clinic today c/o continued sinus congestion, sinus pain, post-nasal drip. Was recently treated for sinusitis with Cephalosporin. Denies improvement in symptoms with medication. Is having some mild chest congestion. Endorses mild low-grade fever this morning 99.0. Denies other noted fever. Denies chills.   Past Medical History  Diagnosis Date  . GERD (gastroesophageal reflux disease)   . Hyperlipidemia   . Hypertension   . BPH (benign prostatic hyperplasia)   . RAD (reactive airway disease)     no PMH of asthma; RAD post infection  . Carpal tunnel syndrome on both sides   . Rheumatoid arthritis ([place])     Dr Ouida Sills    Current Outpatient Prescriptions on File Prior to Visit  Medication Sig Dispense Refill  . amLODipine (NORVASC) 5 MG tablet Take 1 tablet (5 mg total) by mouth daily. 90 tablet 3  . cetirizine (ZYRTEC) 10 MG tablet Take 1 tablet (10 mg total) by mouth daily. 90 tablet 3  . Cyanocobalamin (VITAMIN B-12 IJ) Inject 1,000 mcg as directed every 30 (thirty) days. Reported on 08/30/2015    . ferrous sulfate 325 (65 FE) MG tablet Take 3 tablets 3 times a day (Patient taking differently: Take 1 tablets 3 times a day) 270 tablet 3  . finasteride (PROSCAR) 5 MG tablet Take 1 tablet (5 mg total) by mouth daily. 90 tablet 3  . folic acid (FOLVITE) 1 MG tablet Take 1 mg by mouth daily.    Marland Kitchen HYDROcodone-homatropine (HYDROMET) 5-1.5 MG/5ML syrup Take 5 mLs by mouth every 4 (four) hours as needed for cough. 240 mL 0  . losartan (COZAAR) 100 MG tablet Take 1 tablet (100 mg total) by mouth daily. 90 tablet 3  . methotrexate (RHEUMATREX) 2.5 MG tablet Take 10 tablets (25 mg total) by mouth once a week. Caution:Chemotherapy. Protect from light. 40 tablet 0  . omeprazole (PRILOSEC) 20 MG capsule Take 1 capsule (20 mg total) by mouth daily. 90 capsule 3  . pravastatin (PRAVACHOL) 40 MG tablet Take 1 tablet (40 mg total) by mouth daily. 90 tablet 3  . triamcinolone cream  (KENALOG) 0.1 % Apply 1 application topically 2 (two) times daily as needed. (Patient not taking: Reported on 08/30/2015) 45 g 1   No current facility-administered medications on file prior to visit.    Allergies  Allergen Reactions  . Morphine And Related     Itching & rash Because of a history of documented adverse serious drug reaction;Medi Alert bracelet  is recommended    Family History  Problem Relation Age of Onset  . Throat cancer Father     smoker  . Esophageal cancer Father   . Prostate cancer Paternal Uncle   . Heart attack Paternal Uncle     3 uncles, >55  . Coronary artery disease Brother     in 64s  . Esophageal cancer Brother   . Skin cancer Mother   . Throat cancer Brother   . Uterine cancer Sister   . COPD Sister     X37  . Ovarian cancer Sister   . Stroke Paternal Grandmother 34  . Colon cancer Paternal Grandfather   . Heart disease Paternal Grandfather   . Asthma Neg Hx   . Heart disease Maternal Grandfather   . Throat cancer Sister     smoker  . Lung cancer Sister     smoker  . Leukemia Sister     Social History   Social History  . Marital Status: Married  Spouse Name: N/A  . Number of Children: 2  . Years of Education: N/A   Occupational History  . 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 Smoking status: Former Smoker    Types: Cigarettes    Quit date: 07/08/1997  . Smokeless tobacco: Never Used     Comment: smoked age [age], up to 1 ppd  . Alcohol Use: No  . Drug Use: No  . Sexual Activity: Not Asked   Other Topics Concern  . None   Social History Narrative   No living will   Requests wife as health care POA--- then daughters   Would accept resuscitation attempts   Not sure about tube feeds   Review of Systems - See HPI.  All other ROS are negative.  BP 158/70 mmHg  Pulse 104  Temp(Src) 98 F (36.7 C) (Oral)  Resp 18  Ht 5\' 6"  (1.676 m)  Wt 182 lb (82.555 kg)  BMI 29.39 kg/m2  SpO2  97%  Physical Exam  Constitutional: He is oriented to person, place, and time and well-developed, well-nourished, and in no distress.  HENT:  Head: Normocephalic and atraumatic.  Right Ear: Tympanic membrane normal.  Left Ear: Tympanic membrane normal.  Nose: Mucosal edema and rhinorrhea present. Right sinus exhibits maxillary sinus tenderness.  Mouth/Throat: Uvula is midline, oropharynx is clear and moist and mucous membranes are normal.  Eyes: Conjunctivae are normal.  Neck: Neck supple.  Cardiovascular: Normal rate, regular rhythm, normal heart sounds and intact distal pulses.   Pulmonary/Chest: Effort normal and breath sounds normal. No respiratory distress. He has no wheezes. He has no rales. He exhibits no tenderness.  Lymphadenopathy:    He has no cervical adenopathy.  Neurological: He is alert and oriented to person, place, and time.  Skin: Skin is warm and dry. No rash noted.  Psychiatric: Affect normal.  Vitals reviewed.   Assessment/Plan: Acute bacterial sinusitis Rx Doxycycline. Depomedrol given.  Increase fluids.  Rest.  Saline nasal spray.  Probiotic.  Mucinex as directed.  Humidifier in bedroom. Tessalon per orders.  Call or return to clinic if symptoms are not improving. Patient to take BP medication once he gets home.

## 2015-09-16 NOTE — Addendum Note (Signed)
Addended by: Levonne Lapping on: 09/16/2015 09:30 AM   Modules accepted: Orders

## 2015-09-16 NOTE — Assessment & Plan Note (Signed)
Rx Doxycycline. Depomedrol given.  Increase fluids.  Rest.  Saline nasal spray.  Probiotic.  Mucinex as directed.  Humidifier in bedroom. Tessalon per orders.  Call or return to clinic if symptoms are not improving. Patient to take BP medication once he gets home.

## 2015-09-27 DIAGNOSIS — M2550 Pain in unspecified joint: Secondary | ICD-10-CM | POA: Diagnosis not present

## 2015-09-27 DIAGNOSIS — Z79899 Other long term (current) drug therapy: Secondary | ICD-10-CM | POA: Diagnosis not present

## 2015-09-27 DIAGNOSIS — M150 Primary generalized (osteo)arthritis: Secondary | ICD-10-CM | POA: Diagnosis not present

## 2015-09-27 DIAGNOSIS — M1990 Unspecified osteoarthritis, unspecified site: Secondary | ICD-10-CM | POA: Diagnosis not present

## 2015-10-31 ENCOUNTER — Ambulatory Visit (INDEPENDENT_AMBULATORY_CARE_PROVIDER_SITE_OTHER)
Admission: RE | Admit: 2015-10-31 | Discharge: 2015-10-31 | Disposition: A | Discharge status: Home / Self Care | Payer: Medicare Other | Source: Ambulatory Visit | Attending: Internal Medicine | Admitting: Internal Medicine

## 2015-10-31 ENCOUNTER — Ambulatory Visit (INDEPENDENT_AMBULATORY_CARE_PROVIDER_SITE_OTHER): Payer: Medicare Other | Admitting: Internal Medicine

## 2015-10-31 ENCOUNTER — Encounter: Payer: Self-pay | Admitting: Internal Medicine

## 2015-10-31 VITALS — BP 140/72 | HR 94 | Temp 97.9°F | Resp 18 | Wt 186.0 lb

## 2015-10-31 DIAGNOSIS — R05 Cough: Secondary | ICD-10-CM | POA: Diagnosis not present

## 2015-10-31 DIAGNOSIS — R053 Chronic cough: Secondary | ICD-10-CM | POA: Insufficient documentation

## 2015-10-31 MED ORDER — ALBUTEROL SULFATE HFA 108 (90 BASE) MCG/ACT IN AERS: 2.0000 | INHALATION_SPRAY | Freq: Four times a day (QID) | RESPIRATORY_TRACT | Status: DC | PRN

## 2015-10-31 MED ORDER — BENZONATATE 200 MG PO CAPS: 200.0000 mg | ORAL_CAPSULE | Freq: Three times a day (TID) | ORAL | Status: DC | PRN

## 2015-10-31 MED ORDER — MONTELUKAST SODIUM 10 MG PO TABS: 10.0000 mg | ORAL_TABLET | Freq: Every day | ORAL | Status: DC

## 2015-10-31 MED ORDER — METHYLPREDNISOLONE ACETATE 80 MG/ML IJ SUSP
80.0000 mg | Freq: Once | INTRAMUSCULAR | Status: AC
  Administered 2015-10-31: 80 mg via INTRAMUSCULAR

## 2015-10-31 NOTE — Addendum Note (Signed)
Addended by: Pilar Grammes on: 10/31/2015 10:50 AM   Modules accepted: Orders

## 2015-10-31 NOTE — Patient Instructions (Signed)
If your cough continues, I will set you up to see a lung specialist.

## 2015-10-31 NOTE — Progress Notes (Signed)
Subjective:    Patient ID: Thomas Hudson, male    DOB: 10/06/1940, 75 y.o.   MRN: [id number]  HPI Here due to persistent cough  Reviewed past 2 notes Fever is gone but still has wheezing at night Awakens every 2 hours Having post nasal drip and it "collects in my chest" Doesn't feel like something in lungs Feels tight in upper chest Not as bad in day--but lots of cough Does sleep on elevated wedge----for a year  On zyrtec daily and flonase Did have spell of asthma type symptoms when seeing Dr Linna Darner Finally went to ENT and seemed to improve with antibiotic  Current Outpatient Prescriptions on File Prior to Visit  Medication Sig Dispense Refill  . amLODipine (NORVASC) 5 MG tablet Take 1 tablet (5 mg total) by mouth daily. 90 tablet 3  . cetirizine (ZYRTEC) 10 MG tablet Take 1 tablet (10 mg total) by mouth daily. 90 tablet 3  . finasteride (PROSCAR) 5 MG tablet Take 1 tablet (5 mg total) by mouth daily. 90 tablet 3  . folic acid (FOLVITE) 1 MG tablet Take 1 mg by mouth daily.    Marland Kitchen losartan (COZAAR) 100 MG tablet Take 1 tablet (100 mg total) by mouth daily. 90 tablet 3  . methotrexate (RHEUMATREX) 2.5 MG tablet Take 10 tablets (25 mg total) by mouth once a week. Caution:Chemotherapy. Protect from light. 40 tablet 0  . omeprazole (PRILOSEC) 20 MG capsule Take 1 capsule (20 mg total) by mouth daily. 90 capsule 3  . pravastatin (PRAVACHOL) 40 MG tablet Take 1 tablet (40 mg total) by mouth daily. 90 tablet 3   No current facility-administered medications on file prior to visit.    Allergies  Allergen Reactions  . Morphine And Related     Itching & rash Because of a history of documented adverse serious drug reaction;Medi Alert bracelet  is recommended    Past Medical History  Diagnosis Date  . GERD (gastroesophageal reflux disease)   . Hyperlipidemia   . Hypertension   . BPH (benign prostatic hyperplasia)   . RAD (reactive airway disease)     no PMH of asthma; RAD post  infection  . Carpal tunnel syndrome on both sides   . Rheumatoid arthritis ([place])     Dr Ouida Sills    Past Surgical History  Procedure Laterality Date  . Colonoscopy w/ polypectomy  2009    X 2; Dr.Perry   . Vasectomy    . Upper gastrointestinal endoscopy       gastric polyp;Dr.Perry   . Lumbar laminectomy  07/2008  . Whipple procedure  2012    partial ; [place]  . Hernia repair      Family History  Problem Relation Age of Onset  . Throat cancer Father     smoker  . Esophageal cancer Father   . Prostate cancer Paternal Uncle   . Heart attack Paternal Uncle     3 uncles, >55  . Coronary artery disease Brother     in 55s  . Esophageal cancer Brother   . Skin cancer Mother   . Throat cancer Brother   . Uterine cancer Sister   . COPD Sister     X68  . Ovarian cancer Sister   . Stroke Paternal Grandmother 24  . Colon cancer Paternal Grandfather   . Heart disease Paternal Grandfather   . Asthma Neg Hx   . Heart disease Maternal Grandfather   . Throat cancer Sister  smoker  . Lung cancer Sister     smoker  . Leukemia Sister     Social History   Social History  . Marital Status: Married    Spouse Name: N/A  . Number of Children: 2  . Years of Education: N/A   Occupational History  . 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 Smoking status: Former Smoker    Types: Cigarettes    Quit date: 07/08/1997  . Smokeless tobacco: Never Used     Comment: smoked age [age], up to 1 ppd  . Alcohol Use: No  . Drug Use: No  . Sexual Activity: Not on file   Other Topics Concern  . Not on file   Social History Narrative   No living will   Requests wife as health care POA--- then daughters   Would accept resuscitation attempts   Not sure about tube feeds   Review of Systems  Appetite is okay Weight is stable     Objective:   Physical Exam  Constitutional: He appears well-developed and well-nourished. No distress.  Dry cough  HENT:    Mouth/Throat: Oropharynx is clear and moist. No oropharyngeal exudate.  No sinus tenderness Nose fairly clear TMs normal   Neck: Normal range of motion. Neck supple. No thyromegaly present.  Pulmonary/Chest: Effort normal. He has no rales.  Fair air movement--sounds slightly tight but no audible wheezing now  Lymphadenopathy:    He has no cervical adenopathy.          Assessment & Plan:

## 2015-10-31 NOTE — Progress Notes (Signed)
Pre visit review using our clinic review tool, if applicable. No additional management support is needed unless otherwise documented below in the visit note. 

## 2015-10-31 NOTE — Assessment & Plan Note (Addendum)
Doesn't seem to be sick anymore May be allergy related Heartburn controlled with prilosec CXR looks normal  Will give depomedrol today Will add montelukast Albuterol, benzonatate at night Pulmonary referral if persists

## 2015-12-28 DIAGNOSIS — M1990 Unspecified osteoarthritis, unspecified site: Secondary | ICD-10-CM | POA: Diagnosis not present

## 2015-12-28 DIAGNOSIS — Z79899 Other long term (current) drug therapy: Secondary | ICD-10-CM | POA: Diagnosis not present

## 2016-03-07 ENCOUNTER — Other Ambulatory Visit: Payer: Self-pay

## 2016-03-07 DIAGNOSIS — I10 Essential (primary) hypertension: Secondary | ICD-10-CM

## 2016-03-07 DIAGNOSIS — J302 Other seasonal allergic rhinitis: Secondary | ICD-10-CM

## 2016-03-07 DIAGNOSIS — E785 Hyperlipidemia, unspecified: Secondary | ICD-10-CM

## 2016-03-07 DIAGNOSIS — K219 Gastro-esophageal reflux disease without esophagitis: Secondary | ICD-10-CM

## 2016-03-07 MED ORDER — OMEPRAZOLE 20 MG PO CPDR: 20.0000 mg | DELAYED_RELEASE_CAPSULE | Freq: Every day | ORAL | 3 refills | Status: DC

## 2016-03-07 MED ORDER — METHOTREXATE 2.5 MG PO TABS: 25.0000 mg | ORAL_TABLET | ORAL | 3 refills | Status: DC

## 2016-03-07 MED ORDER — FERROUS SULFATE 325 (65 FE) MG PO TABS: 325.0000 mg | ORAL_TABLET | Freq: Three times a day (TID) | ORAL | 3 refills | Status: DC

## 2016-03-07 MED ORDER — AMLODIPINE BESYLATE 5 MG PO TABS: 5.0000 mg | ORAL_TABLET | Freq: Every day | ORAL | 3 refills | Status: DC

## 2016-03-07 MED ORDER — MONTELUKAST SODIUM 10 MG PO TABS: 10.0000 mg | ORAL_TABLET | Freq: Every day | ORAL | 3 refills | Status: DC

## 2016-03-07 MED ORDER — PRAVASTATIN SODIUM 40 MG PO TABS: 40.0000 mg | ORAL_TABLET | Freq: Every day | ORAL | 3 refills | Status: DC

## 2016-03-07 MED ORDER — LOSARTAN POTASSIUM 100 MG PO TABS: 100.0000 mg | ORAL_TABLET | Freq: Every day | ORAL | 3 refills | Status: DC

## 2016-03-07 MED ORDER — FLUTICASONE PROPIONATE 50 MCG/ACT NA SUSP: 2.0000 | Freq: Every day | NASAL | 3 refills | Status: DC

## 2016-03-07 MED ORDER — CETIRIZINE HCL 10 MG PO TABS: 10.0000 mg | ORAL_TABLET | Freq: Every day | ORAL | 3 refills | Status: DC

## 2016-03-07 MED ORDER — FERROUS SULFATE 325 (65 FE) MG PO TABS
325.0000 mg | ORAL_TABLET | Freq: Three times a day (TID) | ORAL | 3 refills | Status: DC
Start: 2016-03-07 — End: 2017-05-02

## 2016-03-07 MED ORDER — FOLIC ACID 1 MG PO TABS
1.0000 mg | ORAL_TABLET | Freq: Every day | ORAL | 3 refills | Status: DC
Start: 2016-03-07 — End: 2016-03-07

## 2016-03-07 MED ORDER — FINASTERIDE 5 MG PO TABS: 5.0000 mg | ORAL_TABLET | Freq: Every day | ORAL | 3 refills | Status: DC

## 2016-03-07 MED ORDER — FOLIC ACID 1 MG PO TABS: 1.0000 mg | ORAL_TABLET | Freq: Every day | ORAL | 3 refills | Status: DC

## 2016-03-07 NOTE — Telephone Encounter (Signed)
I have built all of the meds in the med list. Pt is coming in for his CPE/MCW 04-19-16 Last was 04-18-15. Do you want to give 90 day supply or 1 year supply?

## 2016-03-07 NOTE — Telephone Encounter (Signed)
Pt has list of 11 meds requesting printed rx so pt can take to [place] this weekend. Pt request cb when ready for pick up. Pt will bring list to office in next hour of meds needed. Pt has CPX scheduled 04/19/16.

## 2016-03-07 NOTE — Telephone Encounter (Signed)
Called pt to verify if he wanted 30 or 90 days. He said 90. The rxs have been printed and forwarded to Dr Silvio Pate to sign. Pt aware they will be ready for pickup tomorrow

## 2016-03-07 NOTE — Telephone Encounter (Signed)
Approved: okay to give 1 year supply of all non controlled meds

## 2016-03-26 DIAGNOSIS — M1990 Unspecified osteoarthritis, unspecified site: Secondary | ICD-10-CM | POA: Diagnosis not present

## 2016-03-26 DIAGNOSIS — Z79899 Other long term (current) drug therapy: Secondary | ICD-10-CM | POA: Diagnosis not present

## 2016-03-26 DIAGNOSIS — M2550 Pain in unspecified joint: Secondary | ICD-10-CM | POA: Diagnosis not present

## 2016-03-26 DIAGNOSIS — M150 Primary generalized (osteo)arthritis: Secondary | ICD-10-CM | POA: Diagnosis not present

## 2016-04-16 DIAGNOSIS — R3912 Poor urinary stream: Secondary | ICD-10-CM | POA: Diagnosis not present

## 2016-04-16 DIAGNOSIS — N401 Enlarged prostate with lower urinary tract symptoms: Secondary | ICD-10-CM | POA: Diagnosis not present

## 2016-04-19 ENCOUNTER — Telehealth: Payer: Self-pay

## 2016-04-19 ENCOUNTER — Encounter: Payer: Self-pay | Admitting: Internal Medicine

## 2016-04-19 ENCOUNTER — Ambulatory Visit (INDEPENDENT_AMBULATORY_CARE_PROVIDER_SITE_OTHER): Payer: Medicare Other | Admitting: Internal Medicine

## 2016-04-19 VITALS — BP 130/66 | HR 85 | Temp 97.7°F | Ht 65.5 in | Wt 185.0 lb

## 2016-04-19 DIAGNOSIS — M06049 Rheumatoid arthritis without rheumatoid factor, unspecified hand: Secondary | ICD-10-CM

## 2016-04-19 DIAGNOSIS — J410 Simple chronic bronchitis: Secondary | ICD-10-CM

## 2016-04-19 DIAGNOSIS — N401 Enlarged prostate with lower urinary tract symptoms: Secondary | ICD-10-CM | POA: Diagnosis not present

## 2016-04-19 DIAGNOSIS — K222 Esophageal obstruction: Secondary | ICD-10-CM

## 2016-04-19 DIAGNOSIS — Z23 Encounter for immunization: Secondary | ICD-10-CM | POA: Diagnosis not present

## 2016-04-19 DIAGNOSIS — J4489 Other specified chronic obstructive pulmonary disease: Secondary | ICD-10-CM | POA: Insufficient documentation

## 2016-04-19 DIAGNOSIS — J441 Chronic obstructive pulmonary disease with (acute) exacerbation: Secondary | ICD-10-CM | POA: Insufficient documentation

## 2016-04-19 DIAGNOSIS — J209 Acute bronchitis, unspecified: Secondary | ICD-10-CM | POA: Insufficient documentation

## 2016-04-19 DIAGNOSIS — I10 Essential (primary) hypertension: Secondary | ICD-10-CM | POA: Diagnosis not present

## 2016-04-19 DIAGNOSIS — J449 Chronic obstructive pulmonary disease, unspecified: Secondary | ICD-10-CM | POA: Insufficient documentation

## 2016-04-19 DIAGNOSIS — Z7189 Other specified counseling: Secondary | ICD-10-CM

## 2016-04-19 DIAGNOSIS — Z0000 Encounter for general adult medical examination without abnormal findings: Secondary | ICD-10-CM

## 2016-04-19 DIAGNOSIS — J42 Unspecified chronic bronchitis: Secondary | ICD-10-CM | POA: Insufficient documentation

## 2016-04-19 LAB — COMPREHENSIVE METABOLIC PANEL
ALBUMIN: 4.1 g/dL (ref 3.5–5.2)
ALK PHOS: 69 U/L (ref 39–117)
ALT: 13 U/L (ref 0–53)
AST: 16 U/L (ref 0–37)
BILIRUBIN TOTAL: 0.9 mg/dL (ref 0.2–1.2)
BUN: 13 mg/dL (ref 6–23)
CO2: 26 mEq/L (ref 19–32)
CREATININE: 1.07 mg/dL (ref 0.40–1.50)
Calcium: 9 mg/dL (ref 8.4–10.5)
Chloride: 108 mEq/L (ref 96–112)
GFR: 71.95 mL/min (ref >60.00)
GLUCOSE: 109 mg/dL — AB (ref 70–99)
Potassium: 3.4 mEq/L — ABNORMAL LOW (ref 3.5–5.1)
SODIUM: 141 meq/L (ref 135–145)
TOTAL PROTEIN: 6.4 g/dL (ref 6.0–8.3)

## 2016-04-19 LAB — LIPID PANEL
CHOLESTEROL: 150 mg/dL (ref 0–200)
HDL: 32.2 mg/dL — ABNORMAL LOW (ref >39.00)
LDL Cholesterol: 89 mg/dL (ref 0–99)
NonHDL: 118.12
TRIGLYCERIDES: 146 mg/dL (ref 0.0–149.0)
Total CHOL/HDL Ratio: 5
VLDL: 29.2 mg/dL (ref 0.0–40.0)

## 2016-04-19 LAB — CBC WITH DIFFERENTIAL/PLATELET
BASOS ABS: 0 10*3/uL (ref 0.0–0.1)
Basophils Relative: 0.2 % (ref 0.0–3.0)
EOS ABS: 0.2 10*3/uL (ref 0.0–0.7)
EOS PCT: 2.9 % (ref 0.0–5.0)
HCT: 37.6 % — ABNORMAL LOW (ref 39.0–52.0)
HEMOGLOBIN: 12.6 g/dL — AB (ref 13.0–17.0)
LYMPHS ABS: 0.7 10*3/uL (ref 0.7–4.0)
Lymphocytes Relative: 11.3 % — ABNORMAL LOW (ref 12.0–46.0)
MCHC: 33.4 g/dL (ref 30.0–36.0)
MCV: 83.8 fl (ref 78.0–100.0)
MONO ABS: 0.3 10*3/uL (ref 0.1–1.0)
Monocytes Relative: 4.5 % (ref 3.0–12.0)
NEUTROS PCT: 81.1 % — AB (ref 43.0–77.0)
Neutro Abs: 5.3 10*3/uL (ref 1.4–7.7)
Platelets: 273 10*3/uL (ref 150.0–400.0)
RBC: 4.49 Mil/uL (ref 4.22–5.81)
RDW: 17.2 % — ABNORMAL HIGH (ref 11.5–15.5)
WBC: 6.5 10*3/uL (ref 4.0–10.5)

## 2016-04-19 LAB — PSA: PSA: 0.47 ng/mL (ref 0.10–4.00)

## 2016-04-19 MED ORDER — TRIAMCINOLONE ACETONIDE 0.1 % EX CREA: 1.0000 "application " | TOPICAL_CREAM | Freq: Two times a day (BID) | CUTANEOUS | 1 refills | Status: DC | PRN

## 2016-04-19 NOTE — Telephone Encounter (Signed)
-----   Message from Irene Shipper, MD sent at 04/19/2016 12:26 PM EDT ----- Regarding: appointment Thanks Rich.  Linda, set the pt up for OV with me or extender for dysphagia. Thanks  jp  ----- Message ----- From: [name], MD Sent: 04/19/2016  10:41 AM To: Irene Shipper, MD  John, He probably needs another dilation. Thanks [employer]

## 2016-04-19 NOTE — Assessment & Plan Note (Signed)
See social history Blank forms given 

## 2016-04-19 NOTE — Progress Notes (Signed)
Subjective:    Patient ID: Thomas Hudson, male    DOB: 1941-06-26, 75 y.o.   MRN: [id number]  HPI Here for Medicare wellness and follow up of chronic health conditions Reviewed form and advanced directives Reviewed other doctors No tobacco or alcohol Not much exercise--discussed Vision is okay--needs new prescription Poor hearing--not ready for hearing aide No falls No depression or anhedonia Independent with instrumental ADLs Minor memory issues only---nothing worrisome  Ongoing congestion and cough Wheeze gone with the montelukast Has lots of post nasal drip-- coughs up brown mucus Never went away--heavier since allergies flared up This goes back for years Gets SOB at times  Now sees Dr Dossie Der at [facility] for the inflammatory arthritis Saw PA last time Feels it is controlled with the MTX Told to take tumeric and ginger as well  Has some spots on back of head he wants checked Wonders about needing to see dermatologist Sweats a lot at night--?related to that No flaking or dandruff  Still sees Dr Junious Silk for prostate Just got new prescription from him--but hasn't filled it yet  No chest pain No palpitations Mild foot swelling at times--- nothing persistent No dizziness or syncope  Continues on PPI Still gets some dysphagia--eating or drinking May need to consider dilation again  Current Outpatient Prescriptions on File Prior to Visit  Medication Sig Dispense Refill  . amLODipine (NORVASC) 5 MG tablet Take 1 tablet (5 mg total) by mouth daily. 90 tablet 3  . cetirizine (ZYRTEC) 10 MG tablet Take 1 tablet (10 mg total) by mouth daily. 90 tablet 3  . ferrous sulfate 325 (65 FE) MG tablet Take 1 tablet (325 mg total) by mouth 3 (three) times daily with meals. 270 tablet 3  . finasteride (PROSCAR) 5 MG tablet Take 1 tablet (5 mg total) by mouth daily. 90 tablet 3  . fluticasone (FLONASE) 50 MCG/ACT nasal spray Place 2 sprays into both nostrils daily. 48 g 3  . folic acid  (FOLVITE) 1 MG tablet Take 1 tablet (1 mg total) by mouth daily. 90 tablet 3  . losartan (COZAAR) 100 MG tablet Take 1 tablet (100 mg total) by mouth daily. 90 tablet 3  . methotrexate (RHEUMATREX) 2.5 MG tablet Take 10 tablets (25 mg total) by mouth once a week. Caution:Chemotherapy. Protect from light. 120 tablet 3  . montelukast (SINGULAIR) 10 MG tablet Take 1 tablet (10 mg total) by mouth at bedtime. 90 tablet 3  . omeprazole (PRILOSEC) 20 MG capsule Take 1 capsule (20 mg total) by mouth daily. 90 capsule 3  . pravastatin (PRAVACHOL) 40 MG tablet Take 1 tablet (40 mg total) by mouth daily. 90 tablet 3   No current facility-administered medications on file prior to visit.     Allergies  Allergen Reactions  . Morphine And Related     Itching & rash Because of a history of documented adverse serious drug reaction;Medi Alert bracelet  is recommended    Past Medical History:  Diagnosis Date  . BPH (benign prostatic hyperplasia)   . Carpal tunnel syndrome on both sides   . GERD (gastroesophageal reflux disease)   . Hyperlipidemia   . Hypertension   . RAD (reactive airway disease)    no PMH of asthma; RAD post infection  . Rheumatoid arthritis [facility])         Past Surgical History:  Procedure Laterality Date  . COLONOSCOPY W/ POLYPECTOMY  2009   X 2; Dr.Perry   . HERNIA REPAIR    .  LUMBAR LAMINECTOMY  07/2008  . UPPER GASTROINTESTINAL ENDOSCOPY      gastric polyp;Dr.Perry   . VASECTOMY    . WHIPPLE PROCEDURE  2012   partial ; [facility]    Family History  Problem Relation Age of Onset  . Throat cancer Father     smoker  . Esophageal cancer Father   . Prostate cancer Paternal Uncle   . Heart attack Paternal Uncle     3 uncles, >55  . Coronary artery disease Brother     in 3s  . Esophageal cancer Brother   . Skin cancer Mother   . Throat cancer Brother   . Uterine cancer Sister   . COPD Sister     X36  . Ovarian cancer Sister   . Stroke Paternal Grandmother 31  .  Colon cancer Paternal Grandfather   . Heart disease Paternal Grandfather   . Heart disease Maternal Grandfather   . Throat cancer Sister     smoker  . Lung cancer Sister     smoker  . Leukemia Sister   . Asthma Neg Hx     Social History   Social History  . Marital status: Married    Spouse name: N/A  . Number of children: 2  . Years of education: N/A   Occupational History  . 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 Smoking status: Former Smoker    Types: Cigarettes    Quit date: 07/08/1997  . Smokeless tobacco: Never Used     Comment: smoked age [age], up to 1 ppd  . Alcohol use No  . Drug use: No  . Sexual activity: Not on file   Other Topics Concern  . Not on file   Social History Narrative   No living will   Requests wife as health care POA--- then daughters   Would accept resuscitation attempts   Not sure about tube feeds   Review of Systems No other worrisome skin lesions Appetite is good Weight stable Not a great sleeper--occasionally tired in daytime. No known apnea but does wake up gasping at times Wears seat belt Teeth okay-- keeps up with dentist (Dr Gloriann Loan) Bowels are okay. No blood in stool     Objective:   Physical Exam  Constitutional: He is oriented to person, place, and time. He appears well-developed and well-nourished. No distress.  HENT:  Mouth/Throat: Oropharynx is clear and moist. No oropharyngeal exudate.  Neck: Normal range of motion. Neck supple. No thyromegaly present.  Cardiovascular: Normal rate, regular rhythm, normal heart sounds and intact distal pulses.  Exam reveals no gallop.   No murmur heard. Pulmonary/Chest: Effort normal and breath sounds normal. No respiratory distress. He has no wheezes. He has no rales.  Abdominal: Soft. There is no tenderness.  Musculoskeletal: He exhibits no edema.  Thickening without active inflammation--esp in PIPs in hands  Lymphadenopathy:    He has no cervical adenopathy.    Neurological: He is alert and oriented to person, place, and time.  President-- "Trump, Obama, Clinton----Bush" 100-93- "I'm not good at math" D-l-o...Marland KitchenMarland KitchenMarland Kitchen Recall 2/3  Skin: No rash noted. No erythema.  Has a few papulovesicular lesions on posterior scalp   Psychiatric: He has a normal mood and affect. His behavior is normal.          Assessment & Plan:

## 2016-04-19 NOTE — Patient Instructions (Addendum)
Please call Dr Henrene Pastor about an appointment due to the swallowing problems. Try the cream on your spots on the back of your head. If they don't improve, or go away, I can set you up with a dermatologist.

## 2016-04-19 NOTE — Assessment & Plan Note (Signed)
BP Readings from Last 3 Encounters:  04/19/16 130/66  10/31/15 140/72  09/16/15 (!) 158/70   Good control

## 2016-04-19 NOTE — Addendum Note (Signed)
Addended by: Pilar Grammes on: 04/19/2016 10:54 AM   Modules accepted: Orders

## 2016-04-19 NOTE — Assessment & Plan Note (Signed)
Probably needs another dilation

## 2016-04-19 NOTE — Assessment & Plan Note (Signed)
Years of persistent productive cough Not exacerbated Will set up pulm evaluation May need sleep eval as well

## 2016-04-19 NOTE — Assessment & Plan Note (Signed)
Doing fairly well on the MTX

## 2016-04-19 NOTE — Assessment & Plan Note (Signed)
I have personally reviewed the Medicare Annual Wellness questionnaire and have noted 1. The patient's medical and social history 2. Their use of alcohol, tobacco or illicit drugs 3. Their current medications and supplements 4. The patient's functional ability including ADL's, fall risks, home safety risks and hearing or visual             impairment. 5. Diet and physical activities 6. Evidence for depression or mood disorders  The patients weight, height, BMI and visual acuity have been recorded in the chart I have made referrals, counseling and provided education to the patient based review of the above and I have provided the pt with a written personalized care plan for preventive services.  I have provided you with a copy of your personalized plan for preventive services. Please take the time to review along with your updated medication list.  Flu vaccine today Will check PSA after discussion (per Dr Junious Silk) Just had colon

## 2016-04-19 NOTE — Progress Notes (Signed)
Pre visit review using our clinic review tool, if applicable. No additional management support is needed unless otherwise documented below in the visit note. 

## 2016-04-19 NOTE — Telephone Encounter (Signed)
Pt scheduled to see Dr. Henrene Pastor 04/25/16@8 :30am. Pt aware of appt.

## 2016-04-19 NOTE — Assessment & Plan Note (Signed)
Starting new med with Dr Junious Silk

## 2016-04-25 ENCOUNTER — Ambulatory Visit (INDEPENDENT_AMBULATORY_CARE_PROVIDER_SITE_OTHER): Payer: Medicare Other | Admitting: Internal Medicine

## 2016-04-25 ENCOUNTER — Encounter: Payer: Self-pay | Admitting: Internal Medicine

## 2016-04-25 VITALS — BP 144/70 | HR 88 | Ht 66.25 in | Wt 187.0 lb

## 2016-04-25 DIAGNOSIS — R1310 Dysphagia, unspecified: Secondary | ICD-10-CM

## 2016-04-25 DIAGNOSIS — K219 Gastro-esophageal reflux disease without esophagitis: Secondary | ICD-10-CM | POA: Diagnosis not present

## 2016-04-25 NOTE — Progress Notes (Signed)
HISTORY OF PRESENT ILLNESS:  Thomas Hudson is a 75 y.o. male with past medical history as listed below. 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 He is sent today by Dr. Silvio Pate regarding dysphagia. Patient reports a 2 month history of intermittent solid food dysphagia no weight loss. He does have chronic GERD for which he is maintained on omeprazole 20 mg daily. No active GERD symptoms. Multiple prior colonoscopies. Last 2 examinations 2011 and 2016 negative for neoplasia. Last upper endoscopy 2014 revealed antral erosions but was otherwise negative. Testing for Helicobacter pylori was negative. He has undergone previous upper endoscopy with esophageal dilation remotely. No other GI complaints. Review of blood work from October 2017 find unremarkable comprehensive metabolic panel and CBC. Hemoglobin 12.6.  REVIEW OF SYSTEMS:  All non-GI ROS negative except for sinus and allergy, arthritis, back pain, cough, fatigue, itching, fever, headaches, hearing problems, sore throat  Past Medical History:  Diagnosis Date  . BPH (benign prostatic hyperplasia)   . Carpal tunnel syndrome on both sides   . GERD (gastroesophageal reflux disease)   . Hyperlipidemia   . Hypertension   . RAD (reactive airway disease)    no PMH of asthma; RAD post infection  . Rheumatoid arthritis [facility])         Past Surgical History:  Procedure Laterality Date  . COLONOSCOPY W/ POLYPECTOMY  2009   X 2; Dr.Hazel Wrinkle   . HERNIA REPAIR    . LUMBAR LAMINECTOMY  07/2008  . UPPER GASTROINTESTINAL ENDOSCOPY      gastric polyp;Dr.Lygia Olaes   . VASECTOMY    . WHIPPLE PROCEDURE  2012   partial ; [facility]    Social History LUIGI LORMAN  reports that he  quit smoking about 18 years ago. His smoking use included Cigarettes. He has never used smokeless tobacco. He reports that he does not drink alcohol or use drugs.  family history includes COPD in his sister; Colon cancer in his paternal grandfather; Coronary artery disease in his brother; Esophageal cancer in his brother and father; Heart attack in his paternal uncle; Heart disease in his maternal grandfather and paternal grandfather; Leukemia in his sister; Lung cancer in his sister; Ovarian cancer in his sister; Prostate cancer in his paternal uncle; Skin cancer in his mother; Stroke (age of onset: 70) in his paternal grandmother; Throat cancer in his brother, father, and sister; Uterine cancer in his sister.  Allergies  Allergen Reactions  . Morphine And Related     Itching & rash Because of a history of documented adverse serious drug reaction;Medi Alert bracelet  is recommended       PHYSICAL EXAMINATION: Vital signs: BP (!) 144/70 (BP Location: Left Arm, Patient Position: Sitting, Cuff Size: Normal)   Pulse 88   Ht 5' 6.25" (1.683 m) Comment: height measured without shoes  Wt 187 lb (84.8 kg)   BMI 29.96 kg/m   Constitutional: generally well-appearing, no acute distress Psychiatric: alert and oriented x3, cooperative Eyes: extraocular movements intact, anicteric, conjunctiva pink Mouth: oral pharynx moist, no lesions Neck: supple no lymphadenopathy Cardiovascular: heart regular rate and rhythm, no murmur Lungs: clear to auscultation bilaterally Abdomen: soft, nontender, nondistended, no obvious ascites, no peritoneal signs, normal bowel sounds, no organomegaly Rectal: Omitted Extremities: no clubbing cyanosis or lower extremity edema bilaterally Skin:  no lesions on visible extremities Neuro: No focal deficits. Cranial nerves intact  ASSESSMENT:  #1. Intermittent solid food dysphagia of 2 months duration. Rule out peptic stricture #2. History of GERD. No active GERD symptoms  on PPI #3. History of adenomatous colon polyps. Surveillance up-to-date. Aged out of surveillance #4. History of iron deficiency and B12 anemia as  PLAN:  #1. Upper endoscopy with esophageal dilation.The nature of the procedure, as well as the risks, benefits, and alternatives were carefully and thoroughly reviewed with the patient. Ample time for discussion and questions allowed. The patient understood, was satisfied, and agreed to proceed. #2. Reflux precautions #3. Continue PPI #4. Ongoing general medical care with Dr. Silvio Pate

## 2016-04-25 NOTE — Patient Instructions (Signed)
You have been scheduled for an endoscopy. Please follow written instructions given to you at your visit today. If you use inhalers (even only as needed), please bring them with you on the day of your procedure.   

## 2016-04-26 ENCOUNTER — Encounter: Payer: Self-pay | Admitting: Internal Medicine

## 2016-04-26 ENCOUNTER — Ambulatory Visit (AMBULATORY_SURGERY_CENTER): Payer: Medicare Other | Admitting: Internal Medicine

## 2016-04-26 VITALS — BP 117/63 | HR 68 | Temp 98.8°F | Resp 14 | Ht 66.0 in | Wt 187.0 lb

## 2016-04-26 DIAGNOSIS — K2990 Gastroduodenitis, unspecified, without bleeding: Secondary | ICD-10-CM | POA: Diagnosis not present

## 2016-04-26 DIAGNOSIS — D135 Benign neoplasm of extrahepatic bile ducts: Secondary | ICD-10-CM | POA: Diagnosis not present

## 2016-04-26 DIAGNOSIS — D132 Benign neoplasm of duodenum: Secondary | ICD-10-CM | POA: Diagnosis not present

## 2016-04-26 DIAGNOSIS — R1310 Dysphagia, unspecified: Secondary | ICD-10-CM

## 2016-04-26 DIAGNOSIS — K219 Gastro-esophageal reflux disease without esophagitis: Secondary | ICD-10-CM | POA: Diagnosis not present

## 2016-04-26 DIAGNOSIS — K2970 Gastritis, unspecified, without bleeding: Secondary | ICD-10-CM

## 2016-04-26 DIAGNOSIS — I10 Essential (primary) hypertension: Secondary | ICD-10-CM | POA: Diagnosis not present

## 2016-04-26 DIAGNOSIS — K222 Esophageal obstruction: Secondary | ICD-10-CM

## 2016-04-26 MED ORDER — SODIUM CHLORIDE 0.9 % IV SOLN: 500.0000 mL | INTRAVENOUS | Status: DC

## 2016-04-26 NOTE — Op Note (Signed)
[place] Patient Name: Thomas Hudson Procedure Date: 04/26/2016 10:36 AM MRN: [id number] Endoscopist: Docia Chuck. Thomas Hudson , MD Age: 75 Referring MD:  Date of Birth: May 20, 1941 Gender: Male Account #: [id number] Procedure:                Upper GI endoscopy, with biopsies and Maloney                            dilation of esophagus Indications:              Dysphagia Medicines:                Monitored Anesthesia Care Procedure:                Pre-Anesthesia Assessment:                           - Prior to the procedure, a History and Physical                            was performed, and patient medications and                            allergies were reviewed. The patient's tolerance of                            previous anesthesia was also reviewed. The risks                            and benefits of the procedure and the sedation                            options and risks were discussed with the patient.                            All questions were answered, and informed consent                            was obtained. Prior Anticoagulants: The patient has                            taken no previous anticoagulant or antiplatelet                            agents. ASA Grade Assessment: II - A patient with                            mild systemic disease. After reviewing the risks                            and benefits, the patient was deemed in                            satisfactory condition to undergo the procedure.  After obtaining informed consent, the endoscope was                            passed under direct vision. Throughout the                            procedure, the patient's blood pressure, pulse, and                            oxygen saturations were monitored continuously. The                            Model GIF-[id number]) scope was introduced                            through the mouth, and advanced to the second  part                            of duodenum. The upper GI endoscopy was                            accomplished without difficulty. The patient                            tolerated the procedure well. Scope In: Scope Out: Findings:                 One mild benign-appearing, intrinsic stenosis was                            found 39 cm from the incisors. This measured 1.5 cm                            (inner diameter). The scope was withdrawn, post                            endoscopic survey. Dilation was performed with a                            Maloney dilator with no resistance at 95 Fr. No                            heme. Tolerated well.                           The exam of the esophagus was otherwise normal.                           The stomach revealed antral erosions but was                            otherwise normal. Biopsies were taken with a cold  forceps for Helicobacter pylori testing using                            CLOtest.                           The examined duodenum revealed prominent ampullary                            tissue suspicious for adenoma. Multiple biopsies                            taken.                           The cardia and gastric fundus were normal on                            retroflexion, with small hiatal hernia noted. Complications:            No immediate complications. Estimated Blood Loss:     Estimated blood loss: none. Impression:               - Benign-appearing esophageal stenosis. Dilated.                           - Gastric erosions. Biopsied CLOtest.                           - Prominent ampulla, rule out adenoma. Biopsied. Recommendation:           - Follow-up with Dr. Henrene Hudson in the office in about                            4-6 weeks.                           - Resume previous diet.                           - Continue present medications.                           - Await pathology John N. Thomas Pastor,  MD 04/26/2016 10:56:13 AM This report has been signed electronically.

## 2016-04-26 NOTE — Patient Instructions (Addendum)
YOU HAD AN ENDOSCOPIC PROCEDURE TODAY AT [facility]:   Refer to the procedure report that was given to you for any specific questions about what was found during the examination.  If the procedure report does not answer your questions, please call your gastroenterologist to clarify.  If you requested that your care partner not be given the details of your procedure findings, then the procedure report has been included in a sealed envelope for you to review at your convenience later.  YOU SHOULD EXPECT: Some feelings of bloating in the abdomen. Passage of more gas than usual.  Walking can help get rid of the air that was put into your GI tract during the procedure and reduce the bloating. If you had a lower endoscopy (such as a colonoscopy or flexible sigmoidoscopy) you may notice spotting of blood in your stool or on the toilet paper. If you underwent a bowel prep for your procedure, you may not have a normal bowel movement for a few days.  Please Note:  You might notice some irritation and congestion in your nose or some drainage.  This is from the oxygen used during your procedure.  There is no need for concern and it should clear up in a day or so.  SYMPTOMS TO REPORT IMMEDIATELY:   Following upper endoscopy (EGD)  Vomiting of blood or coffee ground material  New chest pain or pain under the shoulder blades  Painful or persistently difficult swallowing  New shortness of breath  Fever of 100F or higher  Black, tarry-looking stools  For urgent or emergent issues, a gastroenterologist can be reached at any hour by calling [phone].   DIET:  Follow Dilation diet as described in Handout.  ACTIVITY:  You should plan to take it easy for the rest of today and you should NOT DRIVE or use heavy machinery until tomorrow (because of the sedation medicines used during the test).    FOLLOW UP: Our staff will call the number listed on your records the next business day following  your procedure to check on you and address any questions or concerns that you may have regarding the information given to you following your procedure. If we do not reach you, we will leave a message.  However, if you are feeling well and you are not experiencing any problems, there is no need to return our call.  We will assume that you have returned to your regular daily activities without incident.  If any biopsies were taken you will be contacted by phone or by letter within the next 1-3 weeks.  Please call us at [phone] if you have not heard about the biopsies in 3 weeks.    SIGNATURES/CONFIDENTIALITY: You and/or your care partner have signed paperwork which will be entered into your electronic medical record.  These signatures attest to the fact that that the information above on your After Visit Summary has been reviewed and is understood.  Full responsibility of the confidentiality of this discharge information lies with you and/or your care-partner.

## 2016-04-26 NOTE — Progress Notes (Signed)
Called to room to assist during endoscopic procedure.  Patient ID and intended procedure confirmed with present staff. Received instructions for my participation in the procedure from the performing physician.  

## 2016-04-29 ENCOUNTER — Telehealth: Payer: Self-pay

## 2016-04-29 NOTE — Telephone Encounter (Signed)
  Follow up Call-  Call back number 04/26/2016 11/24/2014  Post procedure Call Back phone  # [phone]  Permission to leave phone message Yes Yes  Some recent data might be hidden     Patient questions:  Do you have a fever, pain , or abdominal swelling? No. Pain Score  0 *  Have you tolerated food without any problems? Yes.    Have you been able to return to your normal activities? Yes.    Do you have any questions about your discharge instructions: Diet   No. Medications  No. Follow up visit  No.  Do you have questions or concerns about your Care? No.  Actions: * If pain score is 4 or above: No action needed, pain <4.

## 2016-04-30 LAB — HELICOBACTER PYLORI SCREEN-BIOPSY: UREASE: NEGATIVE

## 2016-05-01 ENCOUNTER — Encounter: Payer: Self-pay | Admitting: Internal Medicine

## 2016-05-09 ENCOUNTER — Encounter: Payer: Medicare Other | Admitting: Internal Medicine

## 2016-05-14 ENCOUNTER — Other Ambulatory Visit (INDEPENDENT_AMBULATORY_CARE_PROVIDER_SITE_OTHER): Payer: Medicare Other

## 2016-05-14 ENCOUNTER — Ambulatory Visit (INDEPENDENT_AMBULATORY_CARE_PROVIDER_SITE_OTHER)
Admission: RE | Admit: 2016-05-14 | Discharge: 2016-05-14 | Disposition: A | Discharge status: Home / Self Care | Payer: Medicare Other | Source: Ambulatory Visit | Attending: Internal Medicine | Admitting: Internal Medicine

## 2016-05-14 ENCOUNTER — Encounter: Payer: Self-pay | Admitting: Internal Medicine

## 2016-05-14 ENCOUNTER — Ambulatory Visit (INDEPENDENT_AMBULATORY_CARE_PROVIDER_SITE_OTHER): Payer: Medicare Other | Admitting: Internal Medicine

## 2016-05-14 VITALS — BP 144/76 | HR 78 | Ht 66.0 in | Wt 189.0 lb

## 2016-05-14 DIAGNOSIS — R058 Other specified cough: Secondary | ICD-10-CM

## 2016-05-14 DIAGNOSIS — M06049 Rheumatoid arthritis without rheumatoid factor, unspecified hand: Secondary | ICD-10-CM | POA: Diagnosis not present

## 2016-05-14 DIAGNOSIS — R05 Cough: Secondary | ICD-10-CM | POA: Diagnosis not present

## 2016-05-14 LAB — CBC WITH DIFFERENTIAL/PLATELET
BASOS ABS: 0 10*3/uL (ref 0.0–0.1)
Basophils Relative: 0.4 % (ref 0.0–3.0)
EOS ABS: 0.3 10*3/uL (ref 0.0–0.7)
EOS PCT: 3.5 % (ref 0.0–5.0)
HCT: 37.2 % — ABNORMAL LOW (ref 39.0–52.0)
HEMOGLOBIN: 12.1 g/dL — AB (ref 13.0–17.0)
Lymphocytes Relative: 13.5 % (ref 12.0–46.0)
Lymphs Abs: 1 10*3/uL (ref 0.7–4.0)
MCHC: 32.6 g/dL (ref 30.0–36.0)
MCV: 84.5 fl (ref 78.0–100.0)
MONO ABS: 0.8 10*3/uL (ref 0.1–1.0)
Monocytes Relative: 10.1 % (ref 3.0–12.0)
Neutro Abs: 5.5 10*3/uL (ref 1.4–7.7)
Neutrophils Relative %: 72.5 % (ref 43.0–77.0)
Platelets: 266 10*3/uL (ref 150.0–400.0)
RBC: 4.4 Mil/uL (ref 4.22–5.81)
RDW: 17.8 % — ABNORMAL HIGH (ref 11.5–15.5)
WBC: 7.6 10*3/uL (ref 4.0–10.5)

## 2016-05-14 MED ORDER — AMOXICILLIN-POT CLAVULANATE 875-125 MG PO TABS: 1.0000 | ORAL_TABLET | Freq: Two times a day (BID) | ORAL | 0 refills | Status: AC

## 2016-05-14 MED ORDER — FAMOTIDINE 20 MG PO TABS: ORAL_TABLET | ORAL | Status: DC

## 2016-05-14 MED ORDER — PREDNISONE 10 MG PO TABS: ORAL_TABLET | ORAL | 0 refills | Status: DC

## 2016-05-14 NOTE — Patient Instructions (Addendum)
Please see patient coordinator before you leave today  to schedule sinus CT  Augmentin 875 mg take one pill twice daily  X 10 days - take at breakfast and supper with large glass of water.  It would help reduce the usual side effects (diarrhea and yeast infections) if you ate cultured yogurt at lunch.   Prednisone 10 mg take  4 each am x 2 days,   2 each am x 2 days,  1 each am x 2 days and stop   prilosec 20 mg x 2  take both  30 min before bfast and add pepcid 20 mg at bedtime  GERD (REFLUX)  is an extremely common cause of respiratory symptoms just like yours , many times with no obvious heartburn at all.    It can be treated with medication, but also with lifestyle changes including elevation of the head of your bed (ideally with 6 inch  bed blocks),  Smoking cessation, avoidance of late meals, excessive alcohol, and avoid fatty foods, chocolate, peppermint, colas, red wine, and acidic juices such as orange juice.  NO MINT OR MENTHOL PRODUCTS SO NO COUGH DROPS   USE SUGARLESS CANDY INSTEAD (Jolley ranchers or Stover's or Life Savers) or even ice chips will also do - the key is to swallow to prevent all throat clearing. NO OIL BASED VITAMINS - use powdered substitutes.    Please remember to go to the lab and x-ray department downstairs for your tests - we will call you with the results when they are available.    Please schedule a follow up office visit in 2 weeks, sooner if needed

## 2016-05-14 NOTE — Progress Notes (Signed)
Subjective:    Patient ID: Thomas Hudson, male    DOB: 09-04-1940,    MRN: [id number]  HPI  13 yowm with RA on MTX quit smoking 1999 some congestion sinus/chest never really went away then worse since Fall 2016 referred to pulmonary clinic 05/14/2016 by Dr   Thomas Hudson for refractory cough.    05/14/2016 1st [facility] office visit/ Thomas Hudson   Chief Complaint  Patient presents with  . Advice Only    Referred by Dr. Silvio Hudson for recurrent bronchitis intermittent Xfew years.   started with head cold in Fall of 2016 then "spread congestion down to chest" with subjective wheeze/cough esp at hs (whenever supine actually regardless of time of day) and daily/noct symptoms since then with sob mostly when coughing only and no resp to ppi = otc with bfast and neg EGD for GERD 04/26/16  Neg resp to singulair/ saba/tessalon  Mucus is variably slightly yellow, can be thick/assoc nasal discharge mostly watery   No obvious other patterns in day to day or daytime variabilty or assoc   cp or chest tightness, subjective wheeze overt sinus or hb symptoms. No unusual exp hx or h/o childhood pna/ asthma or knowledge of premature birth.  Sleeping ok without nocturnal  or early am exacerbation  of respiratory  c/o's or need for noct saba. Also denies any obvious fluctuation of symptoms with weather or environmental changes or other aggravating or alleviating factors except as outlined above   Current Medications, Allergies, Complete Past Medical History, Past Surgical History, Family History, and Social History were reviewed in [employer] record.             Review of Systems  Constitutional: Negative for fever and unexpected weight change.  HENT: Positive for congestion, postnasal drip, rhinorrhea, sinus pressure and sore throat. Negative for dental problem, ear pain, nosebleeds, sneezing and trouble swallowing.   Eyes: Negative for redness and itching.  Respiratory: Positive for  cough and shortness of breath. Negative for chest tightness and wheezing.   Cardiovascular: Negative for palpitations and leg swelling.  Gastrointestinal: Negative for nausea and vomiting.  Genitourinary: Negative for dysuria.  Musculoskeletal: Negative for joint swelling.  Skin: Negative for rash.  Neurological: Negative for headaches.  Hematological: Does not bruise/bleed easily.  Psychiatric/Behavioral: Negative for dysphoric mood. The patient is not nervous/anxious.        Objective:   Physical Exam  amb wm nad  Wt Readings from Last 3 Encounters:  05/14/16 189 lb (85.7 kg)  04/26/16 187 lb (84.8 kg)  04/25/16 187 lb (84.8 kg)    Vital signs reviewed - Note on arrival 02 sats  98% on RA    HEENT: nl dentition,  and oropharynx. Nl external ear canals without cough reflex- moderate bilateral non-specific turbinate edema s purulent secretions     NECK :  without JVD/Nodes/TM/ nl carotid upstrokes bilaterally   LUNGS: no acc muscle use,  Nl contour chest which is clear to A and P bilaterally without cough on insp maneuvers   CV:  RRR  no s3 or murmur or increase in P2, no edema   ABD:  soft and nontender with nl inspiratory excursion in the supine position. No bruits or organomegaly, bowel sounds nl  MS:  Nl gait/ ext warm without deformities, calf tenderness, cyanosis or clubbing No obvious joint restrictions   SKIN: warm and dry without lesions    NEURO:  alert, approp, nl sensorium with  no motor deficits  CXR PA and Lateral:   05/14/2016 :    I personally reviewed images and agree with radiology impression as follows:   wnl  Labs ordered 05/14/2016  = allergy profile/ cbc   NOTE Eos 0.3          Assessment & Plan:

## 2016-05-15 ENCOUNTER — Encounter: Payer: Self-pay | Admitting: Internal Medicine

## 2016-05-15 LAB — RESPIRATORY ALLERGY PROFILE REGION II ~~LOC~~
ALLERGEN, D PTERNOYSSINUS, D1: 0.1 kU/L — AB
Allergen, A. alternata, m6: <0.1 kU/L
Allergen, Cedar tree, t12: <0.1 kU/L
Allergen, Mouse Urine Protein, e78: <0.1 kU/L
Allergen, P. notatum, m1: <0.1 kU/L
Aspergillus fumigatus, m3: <0.1 kU/L
Common Ragweed: <0.1 kU/L
IGE (IMMUNOGLOBULIN E), SERUM: 54 kU/L (ref <115)
Johnson Grass: <0.1 kU/L
Pecan/Hickory Tree IgE: <0.1 kU/L
Rough Pigweed  IgE: <0.1 kU/L
Timothy Grass: <0.1 kU/L

## 2016-05-15 NOTE — Progress Notes (Signed)
Spoke with pt and notified of results per Dr. Wert. Pt verbalized understanding and denied any questions. 

## 2016-05-15 NOTE — Assessment & Plan Note (Signed)
Allergy profile 05/14/2016 >  Eos 0.3 /  IgE   Sinus CT 05/14/2016 >>>   The most common causes of chronic cough in immunocompetent adults include the following: upper airway cough syndrome (UACS), previously referred to as postnasal drip syndrome (PNDS), which is caused by variety of rhinosinus conditions; (2) asthma; (3) GERD; (4) chronic bronchitis from cigarette smoking or other inhaled environmental irritants; (5) nonasthmatic eosinophilic bronchitis; and (6) bronchiectasis.   These conditions, singly or in combination, have accounted for up to 94% of the causes of chronic cough in prospective studies.   Other conditions have constituted no >6% of the causes in prospective studies These have included bronchogenic carcinoma, chronic interstitial pneumonia, sarcoidosis, left ventricular failure, ACEI-induced cough, and aspiration from a condition associated with pharyngeal dysfunction.    Chronic cough is often simultaneously caused by more than one condition. A single cause has been found from 38 to 82% of the time, multiple causes from 18 to 62%. Multiply caused cough has been the result of three diseases up to 42% of the time.       Based on hx and exam, this is most likely:  Classic Upper airway cough syndrome, so named because it's frequently impossible to sort out how much is  CR/sinusitis with freq throat clearing (which can be related to primary GERD)   vs  causing  secondary (" extra esophageal")  GERD from wide swings in gastric pressure that occur with throat clearing, often  promoting self use of mint and menthol lozenges that reduce the lower esophageal sphincter tone and exacerbate the problem further in a cyclical fashion.   These are the same pts (now being labeled as having "irritable larynx syndrome" by some cough centers) who not infrequently have a history of having failed to tolerate ace inhibitors,  dry powder inhalers or biphosphonates or report having atypical reflux symptoms  that don't respond to standard doses of PPI , and are easily confused as having aecopd or asthma flares by even experienced allergists/ pulmonologists.   To complete the initial  w/u needs sinus ct/ allergy profile and short course pred/ augmentin pending results of studies.  Total time devoted to counseling  = 35/26m review case with pt/ discussion of options/alternatives/ personally creating written instructions  in presence of pt  then going over those specific  Instructions directly with the pt including how to use all of the meds but in particular covering each new medication in detail and the difference between the maintenance/automatic meds and the prns using an action plan format for the latter.

## 2016-05-15 NOTE — Assessment & Plan Note (Signed)
Pt on mtx he thinks for about the same time he's been coughing but I see no evidence of mtx lung dz nor or RA lung dz to explain the cough

## 2016-05-17 ENCOUNTER — Telehealth: Payer: Self-pay | Admitting: Internal Medicine

## 2016-05-17 NOTE — Progress Notes (Signed)
Pt aware per 11.10.17 phone note

## 2016-05-17 NOTE — Telephone Encounter (Signed)
Called spoke with patient He is already aware of cxr results and pending CT sinus Advised pt of lab results per MW:  Notes Recorded by Tanda Rockers, MD on 05/16/2016 at 5:36 AM EST Call patient :  Studies are unremarkable, no change in recs  Pt voiced his understanding Nothing further needed; will sign off

## 2016-05-20 ENCOUNTER — Ambulatory Visit (INDEPENDENT_AMBULATORY_CARE_PROVIDER_SITE_OTHER)
Admission: RE | Admit: 2016-05-20 | Discharge: 2016-05-20 | Disposition: A | Discharge status: Home / Self Care | Payer: Medicare Other | Source: Ambulatory Visit | Attending: Internal Medicine | Admitting: Internal Medicine

## 2016-05-20 DIAGNOSIS — R058 Other specified cough: Secondary | ICD-10-CM

## 2016-05-20 DIAGNOSIS — R05 Cough: Secondary | ICD-10-CM | POA: Diagnosis not present

## 2016-05-20 NOTE — Progress Notes (Signed)
Spoke with pt and notified of results per Dr. Wert. Pt verbalized understanding and denied any questions. 

## 2016-05-28 ENCOUNTER — Encounter: Payer: Self-pay | Admitting: Internal Medicine

## 2016-05-28 ENCOUNTER — Telehealth: Payer: Self-pay | Admitting: Internal Medicine

## 2016-05-28 ENCOUNTER — Ambulatory Visit (INDEPENDENT_AMBULATORY_CARE_PROVIDER_SITE_OTHER): Payer: Medicare Other | Admitting: Internal Medicine

## 2016-05-28 VITALS — BP 140/60 | HR 84 | Ht 67.0 in | Wt 188.6 lb

## 2016-05-28 DIAGNOSIS — R05 Cough: Secondary | ICD-10-CM

## 2016-05-28 DIAGNOSIS — R058 Other specified cough: Secondary | ICD-10-CM

## 2016-05-28 LAB — NITRIC OXIDE: Nitric Oxide: 9

## 2016-05-28 MED ORDER — AMOXICILLIN-POT CLAVULANATE 875-125 MG PO TABS: 1.0000 | ORAL_TABLET | Freq: Two times a day (BID) | ORAL | 0 refills | Status: DC

## 2016-05-28 MED ORDER — PREDNISONE 10 MG PO TABS: ORAL_TABLET | ORAL | 0 refills | Status: DC

## 2016-05-28 NOTE — Progress Notes (Signed)
Subjective:    Patient ID: Thomas Hudson, male    DOB: 10/16/1940,    MRN: [id number]    Brief patient profile:  89 yowm with RA on MTX quit smoking 1999 some congestion sinus/chest never really went away then worse since Fall 2016 referred to pulmonary clinic 05/14/2016 by Dr   Silvio Pate for refractory cough.   History of Present Illness  05/14/2016 1st [facility] office visit/ Natalya Domzalski   Chief Complaint  Patient presents with  . Advice Only    Referred by Dr. Silvio Pate for recurrent bronchitis intermittent Xfew years.   started with head cold in Fall of 2016 then "spread congestion down to chest" with subjective wheeze/cough esp at hs (whenever supine actually regardless of time of day) and daily/noct symptoms since then with sob mostly when coughing only and no resp to ppi = otc with bfast and neg EGD for GERD 04/26/16  Neg resp to singulair/ saba/tessalon  Mucus is variably slightly yellow, can be thick/assoc nasal discharge mostly watery rec Please see patient coordinator before you leave today  to schedule sinus CT Augmentin 875 mg take one pill twice daily  X 10 days - take at breakfast and supper with large glass of water.  It would help reduce the usual side effects (diarrhea and yeast infections) if you ate cultured yogurt at lunch.  Prednisone 10 mg take  4 each am x 2 days,   2 each am x 2 days,  1 each am x 2 days and stop  Prilosec 20 mg x 2  take both  30 min before bfast and add pepcid 20 mg at bedtime GERD diet    CT sinus done 05/20/16  Improved vs previous    05/28/2016  f/u ov/Alieu Finnigan re: cough x fall of 2016 / not taking gerd rx as rec  Chief Complaint  Patient presents with  . Follow-up    Pt states that he is still coughing - not much improvement. Very little mucus production, wheezing at night and SOB.  Pt states that he suffered with a sinus headache all weekend, little congestion.     Was transiently improved p last round of augmentin and pred with sinus ct  showing improvement but still significant chronic sinusitis with cough worse at hs and early in am with subj wheeze at hs also and gen facial pressure worst in ams   No obvious patterns in day to day or daytime variability or assoc excess/ purulent sputum or mucus plugs or hemoptysis or cp or chest tightness,  or hb symptoms. No unusual exp hx or h/o childhood pna/ asthma or knowledge of premature birth.  Sleeping ok without nocturnal  or early am exacerbation  of respiratory  c/o's or need for noct saba. Also denies any obvious fluctuation of symptoms with weather or environmental changes or other aggravating or alleviating factors except as outlined above   Current Medications, Allergies, Complete Past Medical History, Past Surgical History, Family History, and Social History were reviewed in [employer] record.  ROS  The following are not active complaints unless bolded sore throat, dysphagia, dental problems, itching, sneezing,  nasal congestion or excess/ purulent secretions, ear ache,   fever, chills, sweats, unintended wt loss, classically pleuritic or exertional cp,  orthopnea pnd or leg swelling, presyncope, palpitations, abdominal pain, anorexia, nausea, vomiting, diarrhea  or change in bowel or bladder habits, change in stools or urine, dysuria,hematuria,  rash, arthralgias, visual complaints, headache, numbness, weakness or ataxia  or problems with walking or coordination,  change in mood/affect or memory.                  Objective:   Physical Exam  amb wm nad  05/28/2016      189  05/14/16 189 lb (85.7 kg)  04/26/16 187 lb (84.8 kg)  04/25/16 187 lb (84.8 kg)    Vital signs reviewed - Note on arrival 02 sats  97% on RA    HEENT: nl dentition,  and oropharynx. Nl external ear canals without cough reflex- mild to moderate bilateral non-specific turbinate edema s purulent secretions     NECK :  without JVD/Nodes/TM/ nl carotid upstrokes  bilaterally   LUNGS: no acc muscle use,  Nl contour chest with prominent pseudowheeze clears with plm    CV:  RRR  no s3 or murmur or increase in P2, no edema   ABD:  soft and nontender with nl inspiratory excursion in the supine position. No bruits or organomegaly, bowel sounds nl  MS:  Nl gait/ ext warm without deformities, calf tenderness, cyanosis or clubbing No obvious joint restrictions   SKIN: warm and dry without lesions    NEURO:  alert, approp, nl sensorium with  no motor deficits          Assessment & Plan:

## 2016-05-28 NOTE — Patient Instructions (Signed)
Please see patient coordinator before you leave today  to schedule ENT eval for 3 weeks from now, no sooner   Augmentin 875 mg take one pill twice daily  X 20 days - take at breakfast and supper with large glass of water.  It would help reduce the usual side effects (diarrhea and yeast infections) if you ate cultured yogurt at lunch.   Prednisone 10 mg take  4 each am x 2 days,   2 each am x 2 days,  1 each am x 2 days and stop   Continue prilosec 20 mg x 2  take both  30 min before bfast and add pepcid 20 mg at bedtime

## 2016-05-28 NOTE — Telephone Encounter (Signed)
lmomtcb x1 

## 2016-05-29 NOTE — Telephone Encounter (Signed)
lmtcb for pt.  

## 2016-05-30 NOTE — Assessment & Plan Note (Addendum)
Allergy profile 05/14/2016 >  Eos 0.3 /  IgE  54 neg RAST  Sinus CT 05/20/2016 > Comparison with prior study reveals interval improvement/ still diffuse thickening  - FENO 05/28/2016  =   9 so very unlikely this is asthma in any form but def not allergic asthma - 05/28/2016 repeat augmentin x 20 days then ent eval at 21 days   Very clear this is not asthma and likely represents chronic sinusitis with pnds / cough but also def risk of secondary gerd from cough adding to cyclical nature of the cough since gerd causes cough and cough contributes to gerd.  Therefore rec max rx directed at both sinus dz and gerd and f/u p 3 weeks rx with ent   I had an extended discussion with the patient reviewing all relevant studies completed to date and  lasting 15 to 20 minutes of a 25 minute visit    Discussed in detail all the  indications, usual  risks and alternatives  relative to the benefits with patient who agrees to proceed with conservative f/u as outlined   Each maintenance medication was reviewed in detail including most importantly the difference between maintenance and prns and under what circumstances the prns are to be triggered using an action plan format that is not reflected in the computer generated alphabetically organized AVS.    Please see instructions for details which were reviewed in writing and the patient given a copy highlighting the part that I personally wrote and discussed at today's ov.

## 2016-05-31 ENCOUNTER — Other Ambulatory Visit: Payer: Self-pay

## 2016-05-31 DIAGNOSIS — R058 Other specified cough: Secondary | ICD-10-CM

## 2016-05-31 DIAGNOSIS — R05 Cough: Secondary | ICD-10-CM

## 2016-05-31 MED ORDER — AMOXICILLIN-POT CLAVULANATE 875-125 MG PO TABS: 1.0000 | ORAL_TABLET | Freq: Two times a day (BID) | ORAL | 0 refills | Status: AC

## 2016-05-31 NOTE — Telephone Encounter (Signed)
Spoke with pt. And according to his avs he was short medication. I went ahead and sent in the rest of his medication to the pharmacy nothing further needed at this time.   Please see patient coordinator before you leave today  to schedule ENT eval for 3 weeks from now, no sooner   Augmentin 875 mg take one pill twice daily  X 20 days - take at breakfast and supper with large glass of water.  It would help reduce the usual side effects (diarrhea and yeast infections) if you ate cultured yogurt at lunch.   Prednisone 10 mg take  4 each am x 2 days,   2 each am x 2 days,  1 each am x 2 days and stop   Continue prilosec 20 mg x 2  take both  30 min before bfast and add pepcid 20 mg at bedtime

## 2016-06-17 ENCOUNTER — Encounter: Payer: Self-pay | Admitting: Internal Medicine

## 2016-06-17 ENCOUNTER — Ambulatory Visit (INDEPENDENT_AMBULATORY_CARE_PROVIDER_SITE_OTHER): Payer: Medicare Other | Admitting: Internal Medicine

## 2016-06-17 VITALS — BP 144/68 | HR 80 | Ht 66.0 in | Wt 188.1 lb

## 2016-06-17 DIAGNOSIS — D132 Benign neoplasm of duodenum: Secondary | ICD-10-CM

## 2016-06-17 DIAGNOSIS — K222 Esophageal obstruction: Secondary | ICD-10-CM | POA: Diagnosis not present

## 2016-06-17 DIAGNOSIS — K219 Gastro-esophageal reflux disease without esophagitis: Secondary | ICD-10-CM

## 2016-06-17 NOTE — Patient Instructions (Signed)
You have been scheduled for an ERCP. Please follow written instructions given to you at your visit today. If you use inhalers (even only as needed), please bring them with you on the day of your procedure.   

## 2016-06-17 NOTE — Progress Notes (Signed)
HISTORY OF PRESENT ILLNESS:  Thomas Hudson is a 75 y.o. 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 Patient also has a history of both B12 and iron deficiency, adenomatous colon polyps, and chronic GERD. He was last evaluated October 2017 for intermittent solid food dysphagia. See that dictation for details. He underwent upper endoscopy at that time. He was found to have a mild distal esophageal stricture, antral erosions, and prominent periampullary tissue suspicious for adenoma. The esophagus was dilated with 54 [place] Maloney dilator. Testing for Helicobacter pylori was negative. Biopsies of the abnormal duodenal mucosa revealed adenoma. Patient presents today for follow-up. He reports to me that his dysphagia is 50% better. Specifically he does notice trouble with liquids at times. Solids as well. No weight loss. No active GERD symptoms. We reviewed his workup. Last colonoscopy 2016 was negative except for diverticulosis.  REVIEW OF SYSTEMS:  All non-GI ROS negative except for cough, sinus and allergy, arthritis, headaches, fatigue  Past Medical History:  Diagnosis Date  . BPH (benign prostatic hyperplasia)   . Carpal tunnel syndrome on both sides   . GERD (gastroesophageal reflux disease)   . Hyperlipidemia   . Hypertension   . RAD (reactive airway disease)    no PMH of asthma; RAD post infection  . Rheumatoid arthritis [facility])         Past Surgical History:  Procedure Laterality Date  . COLONOSCOPY W/ POLYPECTOMY  2009   X 2; Dr.Yee Gangi   . HERNIA REPAIR    . LUMBAR LAMINECTOMY  07/2008  . UPPER GASTROINTESTINAL ENDOSCOPY      gastric polyp;Dr.Romulus Hanrahan   . VASECTOMY    . WHIPPLE PROCEDURE  2012   partial ; [facility]    Social History Thomas Hudson  reports that he quit smoking about 18  years ago. His smoking use included Cigarettes. He has a 50.00 pack-year smoking history. He has never used smokeless tobacco. He reports that he does not drink alcohol or use drugs.  family history includes COPD in his sister; Colon cancer in his paternal grandfather; Coronary artery disease in his brother; Esophageal cancer in his brother and father; Heart attack in his paternal uncle; Heart disease in his maternal grandfather and paternal grandfather; Leukemia in his sister; Lung cancer in his sister; Ovarian cancer in his sister; Prostate cancer in his paternal uncle; Skin cancer in his mother; Stroke (age of onset: 83) in his paternal grandmother; Throat cancer in his brother, father, and sister; Uterine cancer in his sister.  Allergies  Allergen Reactions  . Morphine And Related     Itching & rash Because of a history of documented adverse serious drug reaction;Medi Alert bracelet  is recommended       PHYSICAL EXAMINATION: Vital signs: BP (!) 144/68   Pulse 80   Ht 5\' 6"  (1.676 m)   Wt 188 lb 2 oz (85.3 kg)   BMI 30.36 kg/m  General: Well-developed, well-nourished, no acute distress HEENT: Sclerae are anicteric, conjunctiva pink. Oral mucosa intact Lungs: Clear Heart: Regular Abdomen: soft, nontender, nondistended, no obvious ascites, no peritoneal signs, normal bowel sounds. No organomegaly. Extremities: No edema Psychiatric: alert and oriented x3. Cooperative   ASSESSMENT:  #1. GERD. Asymptomatic on current medical therapy #2. Recent problems with dysphagia. Mild distal stricture on endoscopy. Status post dilation. Some symptoms of dysphagia sound like  motility issues rather than mechanical obstruction. 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 Colonoscopy 2016 negative for neoplasia. Routine screening program completed  PLAN:  #1. Patient needs upper endoscopy with side-viewing scope  to further evaluate the abnormal duodenal tissue. If this is not intimately associated with the biliary region, could be resected. If so, may need tertiary referral we discussed this.The nature of the procedure, as well as the risks, benefits, and alternatives were carefully and thoroughly reviewed with the patient. Ample time for discussion and questions allowed. The patient understood, was satisfied, and agreed to proceed. #2. Reflux precautions #3. Continue antireflux medication regimen #4. Continue general medical care with PCP  25 minutes was spent face-to-face with the patient. Greater than 50% a time use for counseling regarding management of his above listed GI problems

## 2016-06-19 DIAGNOSIS — J342 Deviated nasal septum: Secondary | ICD-10-CM | POA: Insufficient documentation

## 2016-06-19 DIAGNOSIS — R05 Cough: Secondary | ICD-10-CM | POA: Diagnosis not present

## 2016-06-19 DIAGNOSIS — J310 Chronic rhinitis: Secondary | ICD-10-CM | POA: Diagnosis not present

## 2016-06-19 DIAGNOSIS — J0141 Acute recurrent pansinusitis: Secondary | ICD-10-CM | POA: Diagnosis not present

## 2016-07-03 DIAGNOSIS — M1990 Unspecified osteoarthritis, unspecified site: Secondary | ICD-10-CM | POA: Diagnosis not present

## 2016-07-03 DIAGNOSIS — Z79899 Other long term (current) drug therapy: Secondary | ICD-10-CM | POA: Diagnosis not present

## 2016-07-16 DIAGNOSIS — R05 Cough: Secondary | ICD-10-CM | POA: Diagnosis not present

## 2016-07-16 DIAGNOSIS — J0141 Acute recurrent pansinusitis: Secondary | ICD-10-CM | POA: Diagnosis not present

## 2016-07-16 DIAGNOSIS — J342 Deviated nasal septum: Secondary | ICD-10-CM | POA: Diagnosis not present

## 2016-07-16 DIAGNOSIS — J324 Chronic pansinusitis: Secondary | ICD-10-CM | POA: Diagnosis not present

## 2016-07-16 DIAGNOSIS — J0191 Acute recurrent sinusitis, unspecified: Secondary | ICD-10-CM | POA: Diagnosis not present

## 2016-07-25 ENCOUNTER — Encounter (HOSPITAL_COMMUNITY): Payer: Self-pay

## 2016-07-28 NOTE — Anesthesia Preprocedure Evaluation (Addendum)
Anesthesia Evaluation  Patient identified by MRN, date of birth, ID band Patient awake    Reviewed: Allergy & Precautions, NPO status , Patient's Chart, lab work & pertinent test results  History of Anesthesia Complications Negative for: history of anesthetic complications  Airway Mallampati: II  TM Distance: >3 FB Neck ROM: Full    Dental no notable dental hx. (+) Dental Advisory Given   Pulmonary former smoker,    Pulmonary exam normal        Cardiovascular hypertension, negative cardio ROS Normal cardiovascular exam     Neuro/Psych negative neurological ROS     GI/Hepatic Neg liver ROS, GERD  ,  Endo/Other  negative endocrine ROS  Renal/GU negative Renal ROS     Musculoskeletal negative musculoskeletal ROS (+)   Abdominal   Peds  Hematology negative hematology ROS (+)   Anesthesia Other Findings Day of surgery medications reviewed with the patient.  Reproductive/Obstetrics                            Anesthesia Physical Anesthesia Plan  ASA: III  Anesthesia Plan: MAC   Post-op Pain Management:    Induction:   Airway Management Planned: Natural Airway  Additional Equipment:   Intra-op Plan:   Post-operative Plan:   Informed Consent: I have reviewed the patients History and Physical, chart, labs and discussed the procedure including the risks, benefits and alternatives for the proposed anesthesia with the patient or authorized representative who has indicated his/her understanding and acceptance.   Dental advisory given  Plan Discussed with: CRNA and Anesthesiologist  Anesthesia Plan Comments:        Anesthesia Quick Evaluation

## 2016-07-29 ENCOUNTER — Encounter (HOSPITAL_COMMUNITY): Payer: Self-pay

## 2016-07-29 ENCOUNTER — Ambulatory Visit (HOSPITAL_COMMUNITY): Payer: Medicare Other | Admitting: Anesthesiology

## 2016-07-29 ENCOUNTER — Ambulatory Visit (HOSPITAL_COMMUNITY)
Admission: RE | Admit: 2016-07-29 | Discharge: 2016-07-29 | Disposition: A | Discharge status: Home / Self Care | Payer: Medicare Other | Source: Ambulatory Visit | Attending: Internal Medicine | Admitting: Internal Medicine

## 2016-07-29 ENCOUNTER — Encounter (HOSPITAL_COMMUNITY)
Admission: RE | Disposition: A | Discharge status: Home / Self Care | Payer: Self-pay | Source: Ambulatory Visit | Attending: Internal Medicine

## 2016-07-29 DIAGNOSIS — I10 Essential (primary) hypertension: Secondary | ICD-10-CM | POA: Insufficient documentation

## 2016-07-29 DIAGNOSIS — J45909 Unspecified asthma, uncomplicated: Secondary | ICD-10-CM | POA: Insufficient documentation

## 2016-07-29 DIAGNOSIS — M069 Rheumatoid arthritis, unspecified: Secondary | ICD-10-CM | POA: Diagnosis not present

## 2016-07-29 DIAGNOSIS — Z801 Family history of malignant neoplasm of trachea, bronchus and lung: Secondary | ICD-10-CM | POA: Insufficient documentation

## 2016-07-29 DIAGNOSIS — Z800 Family history of malignant neoplasm of digestive organs: Secondary | ICD-10-CM | POA: Insufficient documentation

## 2016-07-29 DIAGNOSIS — Z8249 Family history of ischemic heart disease and other diseases of the circulatory system: Secondary | ICD-10-CM | POA: Diagnosis not present

## 2016-07-29 DIAGNOSIS — Z87891 Personal history of nicotine dependence: Secondary | ICD-10-CM | POA: Insufficient documentation

## 2016-07-29 DIAGNOSIS — G5603 Carpal tunnel syndrome, bilateral upper limbs: Secondary | ICD-10-CM | POA: Insufficient documentation

## 2016-07-29 DIAGNOSIS — K219 Gastro-esophageal reflux disease without esophagitis: Secondary | ICD-10-CM | POA: Diagnosis not present

## 2016-07-29 DIAGNOSIS — Z8041 Family history of malignant neoplasm of ovary: Secondary | ICD-10-CM | POA: Diagnosis not present

## 2016-07-29 DIAGNOSIS — Z8049 Family history of malignant neoplasm of other genital organs: Secondary | ICD-10-CM | POA: Diagnosis not present

## 2016-07-29 DIAGNOSIS — K3189 Other diseases of stomach and duodenum: Secondary | ICD-10-CM | POA: Insufficient documentation

## 2016-07-29 DIAGNOSIS — Z806 Family history of leukemia: Secondary | ICD-10-CM | POA: Diagnosis not present

## 2016-07-29 DIAGNOSIS — Z9889 Other specified postprocedural states: Secondary | ICD-10-CM | POA: Insufficient documentation

## 2016-07-29 DIAGNOSIS — D132 Benign neoplasm of duodenum: Secondary | ICD-10-CM

## 2016-07-29 DIAGNOSIS — Z823 Family history of stroke: Secondary | ICD-10-CM | POA: Insufficient documentation

## 2016-07-29 DIAGNOSIS — Z885 Allergy status to narcotic agent status: Secondary | ICD-10-CM | POA: Insufficient documentation

## 2016-07-29 DIAGNOSIS — E785 Hyperlipidemia, unspecified: Secondary | ICD-10-CM | POA: Insufficient documentation

## 2016-07-29 DIAGNOSIS — Z825 Family history of asthma and other chronic lower respiratory diseases: Secondary | ICD-10-CM | POA: Insufficient documentation

## 2016-07-29 DIAGNOSIS — Z8042 Family history of malignant neoplasm of prostate: Secondary | ICD-10-CM | POA: Diagnosis not present

## 2016-07-29 DIAGNOSIS — N400 Enlarged prostate without lower urinary tract symptoms: Secondary | ICD-10-CM | POA: Insufficient documentation

## 2016-07-29 HISTORY — PX: ESOPHAGOGASTRODUODENOSCOPY (EGD) WITH PROPOFOL: SHX5813

## 2016-07-29 SURGERY — ESOPHAGOGASTRODUODENOSCOPY (EGD) WITH PROPOFOL
Anesthesia: Monitor Anesthesia Care

## 2016-07-29 MED ORDER — PROPOFOL 10 MG/ML IV BOLUS
INTRAVENOUS | Status: AC
  Filled 2016-07-29: qty 20

## 2016-07-29 MED ORDER — PROPOFOL 500 MG/50ML IV EMUL
INTRAVENOUS | Status: DC | PRN
  Administered 2016-07-29: 50 ug/kg/min via INTRAVENOUS

## 2016-07-29 MED ORDER — MIDAZOLAM HCL 2 MG/2ML IJ SOLN
INTRAMUSCULAR | Status: AC
Start: 2016-07-29 — End: 2016-07-29
  Filled 2016-07-29: qty 2

## 2016-07-29 MED ORDER — FENTANYL CITRATE (PF) 100 MCG/2ML IJ SOLN
INTRAMUSCULAR | Status: AC
  Filled 2016-07-29: qty 2

## 2016-07-29 MED ORDER — HYDROMORPHONE HCL 1 MG/ML IJ SOLN: 0.2500 mg | INTRAMUSCULAR | Status: DC | PRN

## 2016-07-29 MED ORDER — LACTATED RINGERS IV SOLN
INTRAVENOUS | Status: DC
  Administered 2016-07-29: 08:00:00 via INTRAVENOUS

## 2016-07-29 MED ORDER — SODIUM CHLORIDE 0.9 % IV SOLN: INTRAVENOUS | Status: DC

## 2016-07-29 MED ORDER — PROMETHAZINE HCL 25 MG/ML IJ SOLN: 6.2500 mg | INTRAMUSCULAR | Status: DC | PRN

## 2016-07-29 MED ORDER — PROPOFOL 10 MG/ML IV BOLUS
INTRAVENOUS | Status: DC | PRN
  Administered 2016-07-29: 40 mg via INTRAVENOUS
  Administered 2016-07-29: 20 mg via INTRAVENOUS

## 2016-07-29 SURGICAL SUPPLY — 15 items

## 2016-07-29 NOTE — Anesthesia Postprocedure Evaluation (Addendum)
Anesthesia Post Note  Patient: Thomas Hudson  Procedure(s) Performed: Procedure(s) (LRB): ESOPHAGOGASTRODUODENOSCOPY (EGD) WITH PROPOFOL (N/A)  Patient location during evaluation: Endoscopy Anesthesia Type: MAC Level of consciousness: awake and alert Pain management: pain level controlled Vital Signs Assessment: post-procedure vital signs reviewed and stable Respiratory status: spontaneous breathing and respiratory function stable Cardiovascular status: stable Anesthetic complications: no       Last Vitals:  Vitals:   07/29/16 0803 07/29/16 0928  BP: (!) 164/70   Pulse: 63 70  Resp: (!) 21 17  Temp: 36.5 C     Last Pain:  Vitals:   07/29/16 0803  TempSrc: Oral                 Koron Godeaux DANIEL

## 2016-07-29 NOTE — Op Note (Signed)
[facility] Patient Name: Thomas Hudson Procedure Date: 07/29/2016 MRN: [id number] Attending MD: Docia Chuck. Henrene Pastor , MD Date of Birth: April 25, 1941 CSN: [id number] Age: 76 Admit Type: Outpatient Procedure:                Upper GI endoscopy with side-viewing scope Indications:              Diagnostic procedure, Tumor of the duodenum ;                            history of transient duodenal ampullary adenoma                            resection 2011 [facility]. 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 Now for                            assessment to document relationship to the ampulla Providers:                Docia Chuck. Henrene Pastor, MD, Elmer Ramp. Tilden Dome, RN, Elspeth Cho Tech., Technician, Delena Bali, CRNA Referring MD:              Medicines:                Monitored Anesthesia Care Complications:            No immediate complications. Estimated Blood Loss:     Estimated blood loss: none. Procedure:                Pre-Anesthesia Assessment:                           - Prior to the procedure, a History and Physical                            was performed, and patient medications and                            allergies were reviewed. The patient's tolerance of                            previous anesthesia was also reviewed. The risks                            and benefits of the procedure and the sedation                            options and risks were discussed with the patient.                            All questions were answered, and informed consent  was obtained. Prior Anticoagulants: The patient has                            taken no previous anticoagulant or antiplatelet                            agents. ASA Grade Assessment: II - A patient with                            mild systemic disease. After reviewing the risks                            and benefits, the  patient was deemed in                            satisfactory condition to undergo the procedure.                           After obtaining informed consent, the endoscope was                            passed under direct vision. Throughout the                            procedure, the patient's blood pressure, pulse, and                            oxygen saturations were monitored continuously. The                            [id number]) scope was introduced through                            the mouth, and advanced to the second part of                            duodenum. The upper GI endoscopy was accomplished                            without difficulty. The patient tolerated the                            procedure well. Scope In: Scope Out: Findings:      The distal esophagus was normal. The proximal esophagus was not assessed       . with side-viewing scope      The entire examined stomach was normal, save mild antral gastritis.      A medium-sized sessile mass measuring approximately 2 cm, consistent       with adenoma, with no bleeding was found at the ampulla. Impression:               - Normal distal esophagus.                           - Normal stomach.                           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                           - No specimens collected. Moderate Sedation:      none Recommendation:           - Refer to Dr. Gayleen Orem at [facility] for endoscopic                            removal of periampullary adenoma.                           - Resume previous diet.                           - Continue present medications. Procedure Code(s):        --- Professional ---                           [phone], Esophagogastroduodenoscopy, flexible,                            transoral; diagnostic, including collection of                            specimen(s) by brushing or washing, when performed                            (separate procedure) Diagnosis  Code(s):        --- Professional ---                           K31.89, Other diseases of stomach and duodenum                           D49.0, Neoplasm of unspecified behavior of                            digestive system CPT copyright 2016 American Medical Association. All rights reserved. The codes documented in this report are preliminary and upon coder review may  be revised to meet current compliance requirements. Docia Chuck. Henrene Pastor, MD 07/29/2016 9:43:19 AM This report has been signed electronically. Number of Addenda: 0

## 2016-07-29 NOTE — Discharge Instructions (Signed)
YOU HAD AN ENDOSCOPIC PROCEDURE TODAY: Refer to the procedure report and other information in the discharge instructions given to you for any specific questions about what was found during the examination. If this information does not answer your questions, please call Dr. Wynetta Emery office at [phone] to clarify.   YOU SHOULD EXPECT: Some feelings of bloating in the abdomen. Passage of more gas than usual. Walking can help get rid of the air that was put into your GI tract during the procedure and reduce the bloating. If you had a lower endoscopy (such as a colonoscopy or flexible sigmoidoscopy) you may notice spotting of blood in your stool or on the toilet paper. Some abdominal soreness may be present for a day or two, also.  DIET: Your first meal following the procedure should be a light meal and then it is ok to progress to your normal diet. A half-sandwich or bowl of soup is an example of a good first meal. Heavy or fried foods are harder to digest and may make you feel nauseous or bloated. Drink plenty of fluids but you should avoid alcoholic beverages for 24 hours. If you had a esophageal dilation, please see attached instructions for diet.   ACTIVITY: Your care partner should take you home directly after the procedure. You should plan to take it easy, moving slowly for the rest of the day. You can resume normal activity the day after the procedure however YOU SHOULD NOT DRIVE, use power tools, machinery or perform tasks that involve climbing or major physical exertion for 24 hours (because of the sedation medicines used during the test).   SYMPTOMS TO REPORT IMMEDIATELY: A gastroenterologist can be reached at any hour. Please call [phone] for any of the following symptoms:  Following lower endoscopy (colonoscopy, flexible sigmoidoscopy) Excessive amounts of blood in the stool  Significant tenderness, worsening of abdominal pains  Swelling of the abdomen that is new, acute  Fever of 100  or higher  Following upper endoscopy (EGD, EUS, ERCP, esophageal dilation) Vomiting of blood or coffee ground material  New, significant abdominal pain  New, significant chest pain or pain under the shoulder blades  Painful or persistently difficult swallowing  New shortness of breath  Black, tarry-looking or red, bloody stools  FOLLOW UP:  If any biopsies were taken you will be contacted by phone or by letter within the next 1-3 weeks. Call [phone]  if you have not heard about the biopsies in 3 weeks.  Please also call with any specific questions about appointments or follow up tests.  YOU HAD AN ENDOSCOPIC PROCEDURE TODAY: Refer to the procedure report and other information in the discharge instructions given to you for any specific questions about what was found during the examination. If this information does not answer your questions, please call [place] office at [phone] to clarify.   YOU SHOULD EXPECT: Some feelings of bloating in the abdomen. Passage of more gas than usual. Walking can help get rid of the air that was put into your GI tract during the procedure and reduce the bloating. If you had a lower endoscopy (such as a colonoscopy or flexible sigmoidoscopy) you may notice spotting of blood in your stool or on the toilet paper. Some abdominal soreness may be present for a day or two, also.  DIET: Your first meal following the procedure should be a light meal and then it is ok to progress to your normal diet. A half-sandwich or bowl of soup is an example of  a good first meal. Heavy or fried foods are harder to digest and may make you feel nauseous or bloated. Drink plenty of fluids but you should avoid alcoholic beverages for 24 hours. If you had a esophageal dilation, please see attached instructions for diet.    ACTIVITY: Your care partner should take you home directly after the procedure. You should plan to take it easy, moving slowly for the rest of the day. You can  resume normal activity the day after the procedure however YOU SHOULD NOT DRIVE, use power tools, machinery or perform tasks that involve climbing or major physical exertion for 24 hours (because of the sedation medicines used during the test).   SYMPTOMS TO REPORT IMMEDIATELY: A gastroenterologist can be reached at any hour. Please call [phone]  for any of the following symptoms:  Following lower endoscopy (colonoscopy, flexible sigmoidoscopy) Excessive amounts of blood in the stool  Significant tenderness, worsening of abdominal pains  Swelling of the abdomen that is new, acute  Fever of 100 or higher  Following upper endoscopy (EGD, EUS, ERCP, esophageal dilation) Vomiting of blood or coffee ground material  New, significant abdominal pain  New, significant chest pain or pain under the shoulder blades  Painful or persistently difficult swallowing  New shortness of breath  Black, tarry-looking or red, bloody stools  FOLLOW UP:  If any biopsies were taken you will be contacted by phone or by letter within the next 1-3 weeks. Call [phone]  if you have not heard about the biopsies in 3 weeks.  Please also call with any specific questions about appointments or follow up tests.

## 2016-07-29 NOTE — H&P (Signed)
HISTORY OF PRESENT ILLNESS:  Thomas Hudson is a 76 y.o. 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 No interval issues  REVIEW OF SYSTEMS:  All non-GI ROS negative except for arthritis  Past Medical History:  Diagnosis Date  . BPH (benign prostatic hyperplasia)   . Carpal tunnel syndrome on both sides   . GERD (gastroesophageal reflux disease)   . Hyperlipidemia   . Hypertension   . RAD (reactive airway disease)    no PMH of asthma; RAD post infection  . Rheumatoid arthritis [facility])         Past Surgical History:  Procedure Laterality Date  . COLONOSCOPY W/ POLYPECTOMY  2009   X 2; Dr.Tori Dattilio   . HERNIA REPAIR    . LUMBAR LAMINECTOMY  07/2008  . UPPER GASTROINTESTINAL ENDOSCOPY      gastric polyp;Dr.Jermone Geister   . VASECTOMY    . WHIPPLE PROCEDURE  2012   partial ; [facility]    Social History Thomas Hudson  reports that he quit smoking about 19 years ago. His smoking use included Cigarettes. He has a 50.00 pack-year smoking history. He has never used smokeless tobacco. He reports that he does not drink alcohol or use drugs.  family history includes COPD in his sister; Colon cancer in his paternal grandfather; Coronary artery disease in his brother; Esophageal cancer in his brother and father; Heart attack in his paternal uncle; Heart disease in his maternal grandfather and paternal grandfather; Leukemia in his sister; Lung cancer in his sister; Ovarian cancer in his sister; Prostate cancer in his paternal uncle; Skin cancer in his mother; Stroke (age of onset: 57) in his paternal grandmother; Throat cancer in his brother, father, and sister; Uterine cancer in his sister.  Allergies  Allergen Reactions  . Morphine And Related     Itching & rash Because of a history of documented adverse serious drug reaction;Medi Alert bracelet  is recommended       PHYSICAL EXAMINATION: Vital signs: BP (!) 164/70   Pulse 63   Temp 97.7 F  (36.5 C) (Oral)   Resp (!) 21   Ht 5\' 6"  (1.676 m)   Wt 188 lb (85.3 kg)   SpO2 99%   BMI 30.34 kg/m  General: Well-developed, well-nourished, no acute distress HEENT: Sclerae are anicteric, conjunctiva pink. Oral mucosa intact Lungs: Clear Heart: Regular Abdomen: soft, nontender, nondistended, no obvious ascites, no peritoneal signs, normal bowel sounds. No organomegaly. Extremities: No edema Psychiatric: alert and oriented x3. Cooperative   ASSESSMENT:  1. Duodenal adenoma   PLAN:  1. Evaluated with side viewing scope.The nature of the procedure, as well as the risks, benefits, and alternatives were carefully and thoroughly reviewed with the patient. Ample time for discussion and questions allowed. The patient understood, was satisfied, and agreed to proceed.

## 2016-07-29 NOTE — Transfer of Care (Signed)
Immediate Anesthesia Transfer of Care Note  Patient: Thomas Hudson  Procedure(s) Performed: Procedure(s) with comments: ESOPHAGOGASTRODUODENOSCOPY (EGD) WITH PROPOFOL (N/A) - will need ERCP scope  Patient Location: PACU  Anesthesia Type:MAC  Level of Consciousness:  sedated, patient cooperative and responds to stimulation  Airway & Oxygen Therapy:Patient Spontanous Breathing and Patient connected to face mask oxgen  Post-op Assessment:  Report given to PACU RN and Post -op Vital signs reviewed and stable  Post vital signs:  Reviewed and stable  Last Vitals:  Vitals:   07/29/16 0803 07/29/16 0928  BP: (!) 164/70   Pulse: 63 70  Resp: (!) 21 17  Temp: 72.1 C     Complications: No apparent anesthesia complications

## 2016-07-30 ENCOUNTER — Encounter (HOSPITAL_COMMUNITY): Payer: Self-pay | Admitting: Internal Medicine

## 2016-08-14 ENCOUNTER — Telehealth: Payer: Self-pay | Admitting: Internal Medicine

## 2016-08-14 NOTE — Telephone Encounter (Signed)
Discussed with pt that Dr. Lysle Rubens is reviewing his records and [facility] will contact him once they receive it back from Dr. Lysle Rubens. Pt verbalized understanding.

## 2016-09-17 DIAGNOSIS — D132 Benign neoplasm of duodenum: Secondary | ICD-10-CM | POA: Diagnosis not present

## 2016-10-02 DIAGNOSIS — M1990 Unspecified osteoarthritis, unspecified site: Secondary | ICD-10-CM | POA: Diagnosis not present

## 2016-10-02 DIAGNOSIS — Z79899 Other long term (current) drug therapy: Secondary | ICD-10-CM | POA: Diagnosis not present

## 2016-10-14 DIAGNOSIS — H2513 Age-related nuclear cataract, bilateral: Secondary | ICD-10-CM | POA: Diagnosis not present

## 2016-10-18 DIAGNOSIS — R0602 Shortness of breath: Secondary | ICD-10-CM | POA: Diagnosis not present

## 2016-10-18 DIAGNOSIS — D490 Neoplasm of unspecified behavior of digestive system: Secondary | ICD-10-CM | POA: Diagnosis not present

## 2016-10-18 DIAGNOSIS — K839 Disease of biliary tract, unspecified: Secondary | ICD-10-CM | POA: Diagnosis not present

## 2016-10-18 DIAGNOSIS — D135 Benign neoplasm of extrahepatic bile ducts: Secondary | ICD-10-CM | POA: Diagnosis not present

## 2016-10-18 DIAGNOSIS — M069 Rheumatoid arthritis, unspecified: Secondary | ICD-10-CM | POA: Diagnosis not present

## 2016-10-18 DIAGNOSIS — Z9889 Other specified postprocedural states: Secondary | ICD-10-CM | POA: Diagnosis not present

## 2016-10-18 DIAGNOSIS — I10 Essential (primary) hypertension: Secondary | ICD-10-CM | POA: Diagnosis not present

## 2016-10-18 DIAGNOSIS — Z7951 Long term (current) use of inhaled steroids: Secondary | ICD-10-CM | POA: Diagnosis not present

## 2016-10-18 DIAGNOSIS — Z885 Allergy status to narcotic agent status: Secondary | ICD-10-CM | POA: Diagnosis not present

## 2016-10-18 DIAGNOSIS — Z87891 Personal history of nicotine dependence: Secondary | ICD-10-CM | POA: Diagnosis not present

## 2016-10-18 DIAGNOSIS — Z79899 Other long term (current) drug therapy: Secondary | ICD-10-CM | POA: Diagnosis not present

## 2016-10-18 DIAGNOSIS — K838 Other specified diseases of biliary tract: Secondary | ICD-10-CM | POA: Diagnosis not present

## 2016-10-18 DIAGNOSIS — Z9049 Acquired absence of other specified parts of digestive tract: Secondary | ICD-10-CM | POA: Diagnosis not present

## 2016-10-28 ENCOUNTER — Telehealth: Payer: Self-pay

## 2016-10-28 ENCOUNTER — Other Ambulatory Visit: Payer: Self-pay

## 2016-10-28 ENCOUNTER — Encounter: Payer: Self-pay | Admitting: Oncology

## 2016-10-28 ENCOUNTER — Telehealth: Payer: Self-pay | Admitting: Oncology

## 2016-10-28 DIAGNOSIS — T85528A Displacement of other gastrointestinal prosthetic devices, implants and grafts, initial encounter: Secondary | ICD-10-CM

## 2016-10-28 NOTE — Telephone Encounter (Signed)
-----   Message from Irene Shipper, MD sent at 10/25/2016  4:10 PM EDT ----- Regarding: Needs abdominal films next week Vaughan Basta, Dr. Lysle Rubens placed a pancreatic duct stent in this patient. It should fall out spontaneously, but we need to make sure. Please arrange plain films of the abdomen next Thursday "r/o retained pancreatic stent 5 french / 2 cm in length".  Also, make recall EGD with ERCP scope in 6 months. Thanks

## 2016-10-28 NOTE — Telephone Encounter (Signed)
Appt has been scheduled for the pt to see Dr. Alen Blew on 5/15 at 11am. Pt voiced understanding. Demographics verified. Letter mailed to the pt.

## 2016-10-28 NOTE — Telephone Encounter (Signed)
Orders in epic for 2 view of abd. Pts wife aware for pt  to come Thursday 10/31/16 between 8am-4pm to have xray done. Recall entered in epic for repeat EGD in 80mth.

## 2016-10-31 ENCOUNTER — Ambulatory Visit (INDEPENDENT_AMBULATORY_CARE_PROVIDER_SITE_OTHER)
Admission: RE | Admit: 2016-10-31 | Discharge: 2016-10-31 | Disposition: A | Discharge status: Home / Self Care | Payer: Medicare Other | Source: Ambulatory Visit | Attending: Internal Medicine | Admitting: Internal Medicine

## 2016-10-31 DIAGNOSIS — T85528A Displacement of other gastrointestinal prosthetic devices, implants and grafts, initial encounter: Secondary | ICD-10-CM

## 2016-10-31 DIAGNOSIS — Z9689 Presence of other specified functional implants: Secondary | ICD-10-CM | POA: Diagnosis not present

## 2016-11-01 ENCOUNTER — Other Ambulatory Visit: Payer: Self-pay

## 2016-11-01 DIAGNOSIS — Z4689 Encounter for fitting and adjustment of other specified devices: Secondary | ICD-10-CM

## 2016-11-05 ENCOUNTER — Encounter (HOSPITAL_COMMUNITY): Payer: Self-pay

## 2016-11-05 ENCOUNTER — Encounter (HOSPITAL_COMMUNITY)
Admission: RE | Disposition: A | Discharge status: Home / Self Care | Payer: Self-pay | Source: Ambulatory Visit | Attending: Internal Medicine

## 2016-11-05 ENCOUNTER — Ambulatory Visit (HOSPITAL_COMMUNITY)
Admission: RE | Admit: 2016-11-05 | Discharge: 2016-11-05 | Disposition: A | Discharge status: Home / Self Care | Payer: Medicare Other | Source: Ambulatory Visit | Attending: Internal Medicine | Admitting: Internal Medicine

## 2016-11-05 DIAGNOSIS — Z8249 Family history of ischemic heart disease and other diseases of the circulatory system: Secondary | ICD-10-CM | POA: Diagnosis not present

## 2016-11-05 DIAGNOSIS — K219 Gastro-esophageal reflux disease without esophagitis: Secondary | ICD-10-CM | POA: Diagnosis not present

## 2016-11-05 DIAGNOSIS — M069 Rheumatoid arthritis, unspecified: Secondary | ICD-10-CM | POA: Diagnosis not present

## 2016-11-05 DIAGNOSIS — E785 Hyperlipidemia, unspecified: Secondary | ICD-10-CM | POA: Insufficient documentation

## 2016-11-05 DIAGNOSIS — Z9889 Other specified postprocedural states: Secondary | ICD-10-CM | POA: Diagnosis not present

## 2016-11-05 DIAGNOSIS — Z885 Allergy status to narcotic agent status: Secondary | ICD-10-CM | POA: Diagnosis not present

## 2016-11-05 DIAGNOSIS — Z801 Family history of malignant neoplasm of trachea, bronchus and lung: Secondary | ICD-10-CM | POA: Diagnosis not present

## 2016-11-05 DIAGNOSIS — D135 Benign neoplasm of extrahepatic bile ducts: Secondary | ICD-10-CM | POA: Diagnosis not present

## 2016-11-05 DIAGNOSIS — Z825 Family history of asthma and other chronic lower respiratory diseases: Secondary | ICD-10-CM | POA: Insufficient documentation

## 2016-11-05 DIAGNOSIS — Z800 Family history of malignant neoplasm of digestive organs: Secondary | ICD-10-CM | POA: Diagnosis not present

## 2016-11-05 DIAGNOSIS — Z9852 Vasectomy status: Secondary | ICD-10-CM | POA: Diagnosis not present

## 2016-11-05 DIAGNOSIS — Z8049 Family history of malignant neoplasm of other genital organs: Secondary | ICD-10-CM | POA: Insufficient documentation

## 2016-11-05 DIAGNOSIS — Z806 Family history of leukemia: Secondary | ICD-10-CM | POA: Insufficient documentation

## 2016-11-05 DIAGNOSIS — G5603 Carpal tunnel syndrome, bilateral upper limbs: Secondary | ICD-10-CM | POA: Diagnosis not present

## 2016-11-05 DIAGNOSIS — Z4659 Encounter for fitting and adjustment of other gastrointestinal appliance and device: Secondary | ICD-10-CM | POA: Diagnosis not present

## 2016-11-05 DIAGNOSIS — I10 Essential (primary) hypertension: Secondary | ICD-10-CM | POA: Insufficient documentation

## 2016-11-05 DIAGNOSIS — Z87891 Personal history of nicotine dependence: Secondary | ICD-10-CM | POA: Insufficient documentation

## 2016-11-05 DIAGNOSIS — Z4689 Encounter for fitting and adjustment of other specified devices: Secondary | ICD-10-CM | POA: Diagnosis not present

## 2016-11-05 DIAGNOSIS — Z8042 Family history of malignant neoplasm of prostate: Secondary | ICD-10-CM | POA: Diagnosis not present

## 2016-11-05 DIAGNOSIS — N400 Enlarged prostate without lower urinary tract symptoms: Secondary | ICD-10-CM | POA: Insufficient documentation

## 2016-11-05 DIAGNOSIS — Z8041 Family history of malignant neoplasm of ovary: Secondary | ICD-10-CM | POA: Diagnosis not present

## 2016-11-05 HISTORY — PX: ERCP: SHX5425

## 2016-11-05 SURGERY — ERCP, WITH INTERVENTION IF INDICATED
Anesthesia: Moderate Sedation

## 2016-11-05 SURGERY — EGD (ESOPHAGOGASTRODUODENOSCOPY)
Anesthesia: Moderate Sedation

## 2016-11-05 MED ORDER — SODIUM CHLORIDE 0.9 % IV SOLN: INTRAVENOUS | Status: DC

## 2016-11-05 MED ORDER — MIDAZOLAM HCL 10 MG/2ML IJ SOLN
INTRAMUSCULAR | Status: DC | PRN
  Administered 2016-11-05 (×2): 1 mg via INTRAVENOUS
  Administered 2016-11-05: 2 mg via INTRAVENOUS
  Administered 2016-11-05: 1 mg via INTRAVENOUS
  Administered 2016-11-05: 2 mg via INTRAVENOUS

## 2016-11-05 MED ORDER — LACTATED RINGERS IV SOLN
INTRAVENOUS | Status: DC
  Administered 2016-11-05: 1000 mL via INTRAVENOUS

## 2016-11-05 MED ORDER — MIDAZOLAM HCL 5 MG/ML IJ SOLN
INTRAMUSCULAR | Status: AC
  Filled 2016-11-05: qty 1

## 2016-11-05 MED ORDER — BUTAMBEN-TETRACAINE-BENZOCAINE 2-2-14 % EX AERO
INHALATION_SPRAY | CUTANEOUS | Status: DC | PRN
  Administered 2016-11-05: 2 via TOPICAL

## 2016-11-05 MED ORDER — MIDAZOLAM HCL 5 MG/ML IJ SOLN
INTRAMUSCULAR | Status: AC
Start: 2016-11-05 — End: 2016-11-05
  Filled 2016-11-05: qty 1

## 2016-11-05 MED ORDER — FENTANYL CITRATE (PF) 100 MCG/2ML IJ SOLN
INTRAMUSCULAR | Status: DC | PRN
  Administered 2016-11-05: 12.5 ug via INTRAVENOUS
  Administered 2016-11-05: 25 ug via INTRAVENOUS
  Administered 2016-11-05: 12.5 ug via INTRAVENOUS
  Administered 2016-11-05: 25 ug via INTRAVENOUS

## 2016-11-05 MED ORDER — DIPHENHYDRAMINE HCL 50 MG/ML IJ SOLN
INTRAMUSCULAR | Status: AC
  Filled 2016-11-05: qty 1

## 2016-11-05 MED ORDER — DIPHENHYDRAMINE HCL 50 MG/ML IJ SOLN
INTRAMUSCULAR | Status: DC | PRN
  Administered 2016-11-05 (×2): 25 mg via INTRAVENOUS

## 2016-11-05 MED ORDER — FENTANYL CITRATE (PF) 100 MCG/2ML IJ SOLN
INTRAMUSCULAR | Status: AC
  Filled 2016-11-05: qty 2

## 2016-11-05 MED ORDER — IOPAMIDOL (ISOVUE-300) INJECTION 61%
INTRAVENOUS | Status: AC
  Filled 2016-11-05: qty 50

## 2016-11-05 NOTE — H&P (Signed)
HISTORY OF PRESENT ILLNESS:  Thomas Hudson is a 76 y.o. male with multiple medical problems as listed below. Recently evaluated by Dr. Lysle Rubens at [facility] for ampullectomy for residual adenomatous ampullary polyp. Small pancreatic stent placed. X-rays late last week show persistence of stent without spontaneous passage. Now for ERCP and stent removal.  REVIEW OF SYSTEMS:  All non-GI ROS negative except for  Past Medical History:  Diagnosis Date  . BPH (benign prostatic hyperplasia)   . Carpal tunnel syndrome on both sides   . GERD (gastroesophageal reflux disease)   . Hyperlipidemia   . Hypertension   . RAD (reactive airway disease)    no PMH of asthma; RAD post infection  . Rheumatoid arthritis [facility]         Past Surgical History:  Procedure Laterality Date  . COLONOSCOPY W/ POLYPECTOMY  2009   X 2; Dr.Leisl Spurrier   . ESOPHAGOGASTRODUODENOSCOPY (EGD) WITH PROPOFOL N/A 07/29/2016   Procedure: ESOPHAGOGASTRODUODENOSCOPY (EGD) WITH PROPOFOL;  Surgeon: Irene Shipper, MD;  Location: [facility] ENDOSCOPY;  Service: Endoscopy;  Laterality: N/A;  will need ERCP scope  . HERNIA REPAIR    . LUMBAR LAMINECTOMY  07/2008  . UPPER GASTROINTESTINAL ENDOSCOPY      gastric polyp;Dr.Ruth Tully   . VASECTOMY    . WHIPPLE PROCEDURE  2012   partial ; [facility]    Social History YOLANDA DOCKENDORF  reports that he quit smoking about 19 years ago. His smoking use included Cigarettes. He has a 50.00 pack-year smoking history. He has never used smokeless tobacco. He reports that he does not drink alcohol or use drugs.  family history includes COPD in his sister; Colon cancer in his paternal grandfather; Coronary artery disease in his brother; Esophageal cancer in his brother and father; Heart attack in his paternal uncle; Heart disease in his maternal grandfather and paternal grandfather; Leukemia in his sister; Lung cancer in his sister; Ovarian cancer in his sister; Prostate cancer in his paternal uncle; Skin cancer in his mother;  Stroke (age of onset: 6) in his paternal grandmother; Throat cancer in his brother, father, and sister; Uterine cancer in his sister.  Allergies  Allergen Reactions  . Morphine And Related     Itching & rash Because of a history of documented adverse serious drug reaction;Medi Alert bracelet  is recommended       PHYSICAL EXAMINATION: Vital signs: BP (!) 154/53   Pulse 70   Temp 98.1 F (36.7 C) (Oral)   Resp 13   Ht 5\' 7"  (1.702 m)   Wt 180 lb (81.6 kg)   SpO2 96%   BMI 28.19 kg/m   Constitutional: generally well-appearing, no acute distress Psychiatric: alert and oriented x3, cooperative Eyes: extraocular movements intact, anicteric, conjunctiva pink Mouth: oral pharynx moist, no lesions Neck: supple no lymphadenopathy Cardiovascular: heart regular rate and rhythm, no murmur Lungs: clear to auscultation bilaterally Abdomen: soft, nontender, nondistended, no obvious ascites, no peritoneal signs, normal bowel sounds, no organomegaly Rectal: Omitted Extremities: no lower extremity edema bilaterally Skin: no lesions on visible extremities Neuro: No focal deficits.   ASSESSMENT:  #1. Recent embolectomy for duodenal adenoma #2. Retained pancreatic duct stent. Needs removed   PLAN:  #1. ERCP with pancreatic stent removal.The nature of the procedure, as well as the risks, benefits, and alternatives were carefully and thoroughly reviewed with the patient. Ample time for discussion and questions allowed. The patient understood, was satisfied, and agreed to proceed.

## 2016-11-05 NOTE — Op Note (Signed)
[facility] Patient Name: Thomas Hudson Procedure Date : 11/05/2016 MRN: [id number] Attending MD: Docia Chuck. Henrene Pastor , MD Date of Birth: July 09, 1940 CSN: [id number] Age: 76 Admit Type: Outpatient Procedure:                ERCP, with pancreatic duct stent removal Indications:              Pancreatic stent removal. Recent ampullectomy for                            residual adenoma by Dr. Lysle Rubens at [facility]. 2 weeks Scout                            film revealed persistence of prophylactic                            pancreatic duct stent. Now for removal Providers:                Docia Chuck. Henrene Pastor, MD, Zenon Mayo, RN, Tinnie Gens,                            Technician, Cletis Athens, Technician Referring MD:              Medicines:                Midazolam 7 mg IV, Fentanyl 75 micrograms IV,                            Diphenhydramine 50 mg IV Complications:            No immediate complications. Estimated Blood Loss:     Estimated blood loss: none. Procedure:                Pre-Anesthesia Assessment:                           - Prior to the procedure, a History and Physical                            was performed, and patient medications and                            allergies were reviewed. The patient's tolerance of                            previous anesthesia was also reviewed. The risks                            and benefits of the procedure and the sedation                            options and risks were discussed with the patient.                            All questions were answered, and informed consent  was obtained. Prior Anticoagulants: The patient has                            taken no previous anticoagulant or antiplatelet                            agents. ASA Grade Assessment: II - A patient with                            mild systemic disease. After reviewing the risks                            and benefits, the patient was deemed in                         satisfactory condition to undergo the procedure.                           After obtaining informed consent, the scope was                            passed under direct vision. Throughout the                            procedure, the patient's blood pressure, pulse, and                            oxygen saturations were monitored continuously. The                            [id number]) scope was introduced through                            the mouth, and used to inject contrast into and                            used to locate the major papilla. After obtaining                            informed consent, the scope was passed under direct                            vision. Throughout the procedure, the patient's                            blood pressure, pulse, and oxygen saturations were                            monitored continuously. The ERCP was accomplished                            without difficulty. The patient tolerated the  procedure well. Scope In: Scope Out: Findings:      The upper GI tract was traversed under direct vision without detailed       examination. The esophagus was not examined. The stomach was normal. The       duodenal bulb was normal. Post bulbar duodenum revealed a pancreatic       duct stent emanating into the duodenum. One stent was removed from the       pancreatic duct using a snare. No manipulation, intentionally, of other       ductal system otherwise. Impression:               - Status post recent ampullectomy for residual                            duodenal adenoma with prophylactic pancreatic duct                            stent placement.One stent was removed from the                            pancreatic duct. Recommendation:           - Repeat surveillance endoscopy with biopsies and                            ERCP scope in 6 months for surveillance.                           - Resume  current medications and previous diet                           - Contact Dr. Henrene Pastor in the interim for any                            questions or problems Procedure Code(s):        --- Professional ---                           [id number], Esophagogastroduodenoscopy, flexible,                            transoral; with removal of foreign body(s) Diagnosis Code(s):        --- Professional ---                           Z46.59, Encounter for fitting and adjustment of                            other gastrointestinal appliance and device CPT copyright 2016 American Medical Association. All rights reserved. The codes documented in this report are preliminary and upon coder review may  be revised to meet current compliance requirements. Docia Chuck. Henrene Pastor, MD 11/05/2016 4:48:08 PM This report has been signed electronically. Number of Addenda: 0

## 2016-11-05 NOTE — Discharge Instructions (Signed)

## 2016-11-06 ENCOUNTER — Encounter (HOSPITAL_COMMUNITY): Payer: Self-pay | Admitting: Internal Medicine

## 2016-11-19 ENCOUNTER — Ambulatory Visit (HOSPITAL_BASED_OUTPATIENT_CLINIC_OR_DEPARTMENT_OTHER): Payer: Medicare Other | Admitting: Oncology

## 2016-11-19 ENCOUNTER — Telehealth: Payer: Self-pay | Admitting: Oncology

## 2016-11-19 ENCOUNTER — Ambulatory Visit (HOSPITAL_BASED_OUTPATIENT_CLINIC_OR_DEPARTMENT_OTHER): Payer: Medicare Other

## 2016-11-19 VITALS — BP 144/80 | HR 61 | Temp 98.0°F | Resp 18 | Ht 67.0 in | Wt 189.0 lb

## 2016-11-19 DIAGNOSIS — D509 Iron deficiency anemia, unspecified: Secondary | ICD-10-CM

## 2016-11-19 DIAGNOSIS — D519 Vitamin B12 deficiency anemia, unspecified: Secondary | ICD-10-CM | POA: Diagnosis not present

## 2016-11-19 DIAGNOSIS — M069 Rheumatoid arthritis, unspecified: Secondary | ICD-10-CM | POA: Diagnosis not present

## 2016-11-19 DIAGNOSIS — Z86018 Personal history of other benign neoplasm: Secondary | ICD-10-CM

## 2016-11-19 LAB — CBC WITH DIFFERENTIAL/PLATELET
BASO%: 0.3 % (ref 0.0–2.0)
Basophils Absolute: 0 10*3/uL (ref 0.0–0.1)
EOS%: 2.4 % (ref 0.0–7.0)
Eosinophils Absolute: 0.2 10*3/uL (ref 0.0–0.5)
HEMATOCRIT: 37.6 % — AB (ref 38.4–49.9)
HGB: 12.1 g/dL — ABNORMAL LOW (ref 13.0–17.1)
LYMPH%: 16.4 % (ref 14.0–49.0)
MCH: 25.9 pg — AB (ref 27.2–33.4)
MCHC: 32.3 g/dL (ref 32.0–36.0)
MCV: 80.1 fL (ref 79.3–98.0)
MONO#: 0.7 10*3/uL (ref 0.1–0.9)
MONO%: 10 % (ref 0.0–14.0)
NEUT#: 4.9 10*3/uL (ref 1.5–6.5)
NEUT%: 70.9 % (ref 39.0–75.0)
PLATELETS: 302 10*3/uL (ref 140–400)
RBC: 4.69 10*6/uL (ref 4.20–5.82)
RDW: 21.9 % — AB (ref 11.0–14.6)
WBC: 6.9 10*3/uL (ref 4.0–10.3)
lymph#: 1.1 10*3/uL (ref 0.9–3.3)

## 2016-11-19 LAB — FERRITIN: Ferritin: 9 ng/ml — ABNORMAL LOW (ref 22–316)

## 2016-11-19 LAB — IRON AND TIBC
%SAT: 19 % — ABNORMAL LOW (ref 20–55)
Iron: 76 ug/dL (ref 42–163)
TIBC: 395 ug/dL (ref 202–409)
UIBC: 319 ug/dL (ref 117–376)

## 2016-11-19 NOTE — Addendum Note (Signed)
Addended by: Wyatt Portela on: 11/19/2016 11:29 AM   Modules accepted: Orders

## 2016-11-19 NOTE — Telephone Encounter (Signed)
Gave patient AVS and calender per 5/15 los.  

## 2016-11-19 NOTE — Progress Notes (Signed)
Reason for Referral: Anemia.   HPI: Thomas Hudson is a 76 year old gentleman currently of the [facility] area where he lived for the last 40 years. He is a gentleman with history of rheumatoid arthritis as well as history of ampullary adenoma. He is currently on methotrexate and takes 7.5 mg weekly. 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 Since that time, he has been requiring vitamin B-12 supplementation as well as iron. He has been diagnosed with iron deficiency on previous occasions and have been on oral iron daily intermittently. Most recently he underwent an endoscopy and ERCP which did not show any evidence of active bleeding. His most recent CBC done in March 2018 showed a hemoglobin of 9.7 and MCV of 77. Prior to that his hemoglobin was 10.4 and MCV of 84 in 2017. In March 2017 he had a normal hemoglobin and normal MCV. His most recent iron studies in 2014 showed a level of 26 and ferritin of 5.3. He denied any hematochezia or melena. He denied any hemoptysis or hematemesis. 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  Clinically, he reports more fatigue and tiredness but no falls or syncope. He denied any dizziness, chest pain or difficulty breathing. He still able to ambulate without any difficulties and continues to attend activities of daily living.  He does not report any headaches, further vision, syncope or seizures. He is not report any fevers, chills, sweats or weight loss. He does not report any chest pain, palpitation, orthopnea or leg edema. He does not report any cough, wheezing or hemoptysis. He denies any nausea, vomiting or abdominal pain. He does not report any frequency urgency or hesitancy. He does not report any skin of complaints. Remaining review of systems unremarkable.   Past Medical History:  Diagnosis Date  . BPH (benign  prostatic hyperplasia)   . Carpal tunnel syndrome on both sides   . GERD (gastroesophageal reflux disease)   . Hyperlipidemia   . Hypertension   . RAD (reactive airway disease)    no PMH of asthma; RAD post infection  . Rheumatoid arthritis ([place])       :  Past Surgical History:  Procedure Laterality Date  . COLONOSCOPY W/ POLYPECTOMY  2009   X 2; Dr.Perry   . ERCP N/A 11/05/2016   Procedure: ENDOSCOPIC RETROGRADE CHOLANGIOPANCREATOGRAPHY (ERCP);  Surgeon: Irene Shipper, MD;  Location: [facility] ENDOSCOPY;  Service: Endoscopy;  Laterality: N/A;  . ESOPHAGOGASTRODUODENOSCOPY (EGD) WITH PROPOFOL N/A 07/29/2016   Procedure: ESOPHAGOGASTRODUODENOSCOPY (EGD) WITH PROPOFOL;  Surgeon: Irene Shipper, MD;  Location: [facility] ENDOSCOPY;  Service: Endoscopy;  Laterality: N/A;  will need ERCP scope  . HERNIA REPAIR    . LUMBAR LAMINECTOMY  07/2008  . UPPER GASTROINTESTINAL ENDOSCOPY      gastric polyp;Dr.Perry   . VASECTOMY    . WHIPPLE PROCEDURE  2012   partial ; [facility]  :   Current Outpatient Prescriptions:  .  amLODipine (NORVASC) 5 MG tablet, Take 1 tablet (5 mg total) by mouth daily., Disp: 90 tablet, Rfl: 3 .  cetirizine (ZYRTEC) 10 MG tablet, Take 1 tablet (10 mg total) by mouth daily., Disp: 90 tablet, Rfl: 3 .  ferrous sulfate 325 (65 FE) MG tablet, Take 1 tablet (325 mg total) by mouth 3 (three) times daily with meals., Disp: 270 tablet, Rfl: 3 .  finasteride (PROSCAR) 5 MG tablet,  Take 1 tablet (5 mg total) by mouth daily., Disp: 90 tablet, Rfl: 3 .  fluticasone (FLONASE) 50 MCG/ACT nasal spray, Place 2 sprays into both nostrils daily. (Patient taking differently: Place 2 sprays into both nostrils daily as needed for allergies. ), Disp: 48 g, Rfl: 3 .  folic acid (FOLVITE) 1 MG tablet, Take 1 tablet (1 mg total) by mouth daily., Disp: 90 tablet, Rfl: 3 .  losartan (COZAAR) 100 MG tablet, Take 1 tablet (100 mg total) by mouth daily., Disp: 90 tablet, Rfl: 3 .  methotrexate (RHEUMATREX) 2.5 MG tablet,  Take 10 tablets (25 mg total) by mouth once a week. Caution:Chemotherapy. Protect from light. (Patient taking differently: Take 10 mg by mouth once a week. Caution:Chemotherapy. Protect from light. Mondays), Disp: 120 tablet, Rfl: 3 .  montelukast (SINGULAIR) 10 MG tablet, Take 1 tablet (10 mg total) by mouth at bedtime., Disp: 90 tablet, Rfl: 3 .  omeprazole (PRILOSEC) 20 MG capsule, Take 1 capsule (20 mg total) by mouth daily., Disp: 90 capsule, Rfl: 3 .  pravastatin (PRAVACHOL) 40 MG tablet, Take 1 tablet (40 mg total) by mouth daily., Disp: 90 tablet, Rfl: 3 .  triamcinolone cream (KENALOG) 0.1 %, Apply 1 application topically 2 (two) times daily as needed., Disp: 45 g, Rfl: 1:  Allergies  Allergen Reactions  . Morphine And Related     Itching & rash Because of a history of documented adverse serious drug reaction;Medi Alert bracelet  is recommended  :  Family History  Problem Relation Age of Onset  . Throat cancer Father        smoker  . Esophageal cancer Father   . Prostate cancer Paternal Uncle   . Heart attack Paternal Uncle        3 uncles, >55  . Coronary artery disease Brother        in 54s  . Esophageal cancer Brother   . Skin cancer Mother   . Throat cancer Brother   . Uterine cancer Sister   . COPD Sister        X26  . Ovarian cancer Sister   . Stroke Paternal Grandmother 60  . Colon cancer Paternal Grandfather   . Heart disease Paternal Grandfather   . Heart disease Maternal Grandfather   . Throat cancer Sister        smoker  . Lung cancer Sister        smoker  . Leukemia Sister   . Asthma Neg Hx   :  Social History   Social History  . Marital status: Married    Spouse name: N/A  . Number of children: 2  . Years of education: N/A   Occupational History  . 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 Smoking status: Former Smoker    Packs/day: 1.00    Years: 50.00    Types: Cigarettes    Quit date: 07/08/1997  . Smokeless  tobacco: Never Used     Comment: smoked age [age], up to 1 ppd  . Alcohol use No  . Drug use: No  . Sexual activity: Yes   Other Topics Concern  . Not on file   Social History Narrative   No living will   Requests wife as health care POA--- then daughters   Would accept resuscitation attempts   Not sure about tube feeds  :  Pertinent items are noted in HPI.  Exam: Blood pressure (!) 144/80, pulse 61, temperature  67 F (36.7 C), temperature source Oral, resp. rate 18, height 5\' 7"  (1.702 m), weight 189 lb (85.7 kg), SpO2 100 %.  ECOG 0.  General appearance: alert and cooperative appeared without distress. Throat: No oral thrush or ulcers. Neck: no adenopathy Back: negative Resp: clear to auscultation bilaterally Chest wall: no tenderness Cardio: regular rate and rhythm, S1, S2 normal, no murmur, click, rub or gallop GI: soft, non-tender; bowel sounds normal; no masses,  no organomegaly Extremities: extremities normal, atraumatic, no cyanosis or edema Pulses: 2+ and symmetric  CBC    Component Value Date/Time   WBC 7.6 05/14/2016 1613   RBC 4.40 05/14/2016 1613   HGB 12.1 (L) 05/14/2016 1613   HCT 37.2 (L) 05/14/2016 1613   PLT 266.0 05/14/2016 1613   MCV 84.5 05/14/2016 1613   MCHC 32.6 05/14/2016 1613   RDW 17.8 (H) 05/14/2016 1613   LYMPHSABS 1.0 05/14/2016 1613   MONOABS 0.8 05/14/2016 1613   EOSABS 0.3 05/14/2016 1613   BASOSABS 0.0 05/14/2016 1613     Chemistry      Component Value Date/Time   NA 141 04/19/2016 1055   K 3.4 (L) 04/19/2016 1055   CL 108 04/19/2016 1055   CO2 26 04/19/2016 1055   BUN 13 04/19/2016 1055   CREATININE 1.07 04/19/2016 1055      Component Value Date/Time   CALCIUM 9.0 04/19/2016 1055   ALKPHOS 69 04/19/2016 1055   AST 16 04/19/2016 1055   ALT 13 04/19/2016 1055   BILITOT 0.9 04/19/2016 1055       Dg Abd 2 Views  Result Date: 10/31/2016 CLINICAL DATA:  Pancreatic duct stent placement two weeks ago into EXAM: ABDOMEN -  2 VIEW COMPARISON:  None FINDINGS: Visualization of a pancreatic stent RIGHT paraspinal at L1-L2. Bones demineralized with scattered degenerative disc disease changes. Slightly increased stool in proximal half of colon. Bowel gas pattern otherwise normal. Lung bases grossly clear. IMPRESSION: Pancreatic stent visualized RIGHT paraspinal at L1-L2. Electronically Signed   By: Lavonia Dana M.D.   On: 10/31/2016 11:54    Assessment and Plan:   76 year old gentleman with the following issues:  1. 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 His hemoglobin in March 2018 was 9.7 with an MCV of 77 and an RDW of 17.7. These findings suggest iron deficiency anemia although other vitamin deficiency could also be contributing. The etiology of his iron deficiency anemia could be related to his Whipple procedure and poor iron absorption. He did have a duodenal adenoma that was resected recently which could have contributed to his iron deficiency.  From management standpoint, we have discussed the role of oral iron versus intravenous iron to replace his iron deficiency. Risks and benefits of IV iron were reviewed today in the form of Feraheme. Complications includes arthralgias, myalgias and rarely anaphylaxis were reviewed. After discussion today is agreeable to proceed with IV iron. Upon completing IV iron, he will continue on oral iron daily for maintenance purposes.  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  4. Rheumatoid arthritis: He is currently on methotrexate which could be contributing to his vitamin deficiency. We will check his vitamin B12 as well today and replace as needed.  5. Follow-up: Will be in 3 months to recheck his iron levels at that time.

## 2016-11-20 LAB — VITAMIN B12: VITAMIN B 12: 520 pg/mL (ref 232–1245)

## 2016-11-27 ENCOUNTER — Ambulatory Visit (HOSPITAL_BASED_OUTPATIENT_CLINIC_OR_DEPARTMENT_OTHER): Payer: Medicare Other

## 2016-11-27 VITALS — BP 130/64 | HR 62 | Temp 97.9°F | Resp 18

## 2016-11-27 DIAGNOSIS — D509 Iron deficiency anemia, unspecified: Secondary | ICD-10-CM

## 2016-11-27 MED ORDER — SODIUM CHLORIDE 0.9 % IV SOLN
Freq: Once | INTRAVENOUS | Status: AC
  Administered 2016-11-27: 09:00:00 via INTRAVENOUS

## 2016-11-27 MED ORDER — SODIUM CHLORIDE 0.9 % IV SOLN
510.0000 mg | Freq: Once | INTRAVENOUS | Status: AC
  Administered 2016-11-27: 510 mg via INTRAVENOUS
  Filled 2016-11-27: qty 17

## 2016-11-27 NOTE — Progress Notes (Signed)
Patient tolerated Feraheme infusion without complications.

## 2016-11-27 NOTE — Patient Instructions (Signed)

## 2016-12-04 ENCOUNTER — Ambulatory Visit (HOSPITAL_BASED_OUTPATIENT_CLINIC_OR_DEPARTMENT_OTHER): Payer: Medicare Other

## 2016-12-04 VITALS — BP 150/73 | HR 61 | Temp 97.5°F | Resp 18

## 2016-12-04 DIAGNOSIS — D509 Iron deficiency anemia, unspecified: Secondary | ICD-10-CM

## 2016-12-04 MED ORDER — SODIUM CHLORIDE 0.9 % IV SOLN
Freq: Once | INTRAVENOUS | Status: AC
  Administered 2016-12-04: 08:00:00 via INTRAVENOUS

## 2016-12-04 MED ORDER — FERUMOXYTOL INJECTION 510 MG/17 ML
510.0000 mg | Freq: Once | INTRAVENOUS | Status: AC
  Administered 2016-12-04: 510 mg via INTRAVENOUS
  Filled 2016-12-04: qty 17

## 2016-12-04 NOTE — Patient Instructions (Signed)

## 2016-12-07 NOTE — Addendum Note (Signed)
Addendum  created 12/07/16 0825 by Duane Boston, MD   Sign clinical note

## 2016-12-14 DIAGNOSIS — J0100 Acute maxillary sinusitis, unspecified: Secondary | ICD-10-CM | POA: Diagnosis not present

## 2016-12-31 DIAGNOSIS — M19042 Primary osteoarthritis, left hand: Secondary | ICD-10-CM | POA: Diagnosis not present

## 2016-12-31 DIAGNOSIS — M19041 Primary osteoarthritis, right hand: Secondary | ICD-10-CM | POA: Diagnosis not present

## 2016-12-31 DIAGNOSIS — M1990 Unspecified osteoarthritis, unspecified site: Secondary | ICD-10-CM | POA: Diagnosis not present

## 2016-12-31 DIAGNOSIS — M549 Dorsalgia, unspecified: Secondary | ICD-10-CM | POA: Diagnosis not present

## 2016-12-31 DIAGNOSIS — Z79899 Other long term (current) drug therapy: Secondary | ICD-10-CM | POA: Diagnosis not present

## 2017-02-20 ENCOUNTER — Telehealth: Payer: Self-pay | Admitting: Oncology

## 2017-02-20 ENCOUNTER — Ambulatory Visit (HOSPITAL_BASED_OUTPATIENT_CLINIC_OR_DEPARTMENT_OTHER): Payer: Medicare Other | Admitting: Oncology

## 2017-02-20 ENCOUNTER — Other Ambulatory Visit (HOSPITAL_BASED_OUTPATIENT_CLINIC_OR_DEPARTMENT_OTHER): Payer: Medicare Other

## 2017-02-20 VITALS — BP 170/65 | HR 71 | Temp 97.8°F | Resp 18 | Ht 67.0 in | Wt 187.8 lb

## 2017-02-20 DIAGNOSIS — D509 Iron deficiency anemia, unspecified: Secondary | ICD-10-CM | POA: Diagnosis not present

## 2017-02-20 DIAGNOSIS — D519 Vitamin B12 deficiency anemia, unspecified: Secondary | ICD-10-CM

## 2017-02-20 DIAGNOSIS — M069 Rheumatoid arthritis, unspecified: Secondary | ICD-10-CM

## 2017-02-20 LAB — CBC WITH DIFFERENTIAL/PLATELET
BASO%: 0 % (ref 0.0–2.0)
Basophils Absolute: 0 10*3/uL (ref 0.0–0.1)
EOS ABS: 0.2 10*3/uL (ref 0.0–0.5)
EOS%: 3.8 % (ref 0.0–7.0)
HCT: 41.5 % (ref 38.4–49.9)
HEMOGLOBIN: 13.9 g/dL (ref 13.0–17.1)
LYMPH#: 0.7 10*3/uL — AB (ref 0.9–3.3)
LYMPH%: 14.9 % (ref 14.0–49.0)
MCH: 30.3 pg (ref 27.2–33.4)
MCHC: 33.5 g/dL (ref 32.0–36.0)
MCV: 90.4 fL (ref 79.3–98.0)
MONO#: 0.5 10*3/uL (ref 0.1–0.9)
MONO%: 10.1 % (ref 0.0–14.0)
NEUT%: 71.2 % (ref 39.0–75.0)
NEUTROS ABS: 3.4 10*3/uL (ref 1.5–6.5)
PLATELETS: 222 10*3/uL (ref 140–400)
RBC: 4.59 10*6/uL (ref 4.20–5.82)
RDW: 14.7 % — ABNORMAL HIGH (ref 11.0–14.6)
WBC: 4.8 10*3/uL (ref 4.0–10.3)

## 2017-02-20 LAB — FERRITIN: Ferritin: 40 ng/ml (ref 22–316)

## 2017-02-20 LAB — IRON AND TIBC
%SAT: 32 % (ref 20–55)
Iron: 88 ug/dL (ref 42–163)
TIBC: 274 ug/dL (ref 202–409)
UIBC: 186 ug/dL (ref 117–376)

## 2017-02-20 NOTE — Progress Notes (Signed)
Hematology and Oncology Follow Up Visit  Thomas Hudson [id number] 29-Nov-1940 76 y.o. 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   Prior Therapy: IV iron therapy completed in May 2018. He received a total of 1000 mg of Feraheme split between 11/27/2016 and 12/04/2016.  Current therapy: Iron sulfate orally 325 mg tablets 3 times a day.  Interim History: Thomas Hudson presents today for a follow-up visit. Since the last visit, he received IV iron replacement and tolerated it well. He felt his energy and performance status improved after that. He denied any complications related to intravenous iron including arthralgias or myalgias. He denied any other infusion related reactions. He denied any hematochezia, melena or dyspepsia. He continues to be active and performs activities of daily living.  He does not report any headaches, further vision, syncope or seizures. He is not report any fevers, chills, sweats or weight loss. He does not report any chest pain, palpitation, orthopnea or leg edema. He does not report any cough, wheezing or hemoptysis. He denies any nausea, vomiting or abdominal pain. He does not report any frequency urgency or hesitancy. He does not report any skin of complaints. Remaining review of systems unremarkable.   Medications: I have reviewed the patient's current medications.  Current Outpatient Prescriptions  Medication Sig Dispense Refill  . amLODipine (NORVASC) 5 MG tablet Take 1 tablet (5 mg total) by mouth daily. 90 tablet 3  . cetirizine (ZYRTEC) 10 MG tablet Take 1 tablet (10 mg total) by mouth daily. 90 tablet 3  . ferrous sulfate 325 (65 FE) MG tablet Take 1 tablet (325 mg total) by mouth 3 (three) times daily with meals. 270 tablet 3  . finasteride (PROSCAR) 5 MG tablet Take 1 tablet (5 mg total) by mouth daily. 90 tablet 3  .  fluticasone (FLONASE) 50 MCG/ACT nasal spray Place 2 sprays into both nostrils daily. (Patient taking differently: Place 2 sprays into both nostrils daily as needed for allergies. ) 48 g 3  . folic acid (FOLVITE) 1 MG tablet Take 1 tablet (1 mg total) by mouth daily. 90 tablet 3  . losartan (COZAAR) 100 MG tablet Take 1 tablet (100 mg total) by mouth daily. 90 tablet 3  . methotrexate (RHEUMATREX) 2.5 MG tablet Take 10 tablets (25 mg total) by mouth once a week. Caution:Chemotherapy. Protect from light. (Patient taking differently: Take 10 mg by mouth once a week. Caution:Chemotherapy. Protect from light. Mondays) 120 tablet 3  . montelukast (SINGULAIR) 10 MG tablet Take 1 tablet (10 mg total) by mouth at bedtime. 90 tablet 3  . omeprazole (PRILOSEC) 20 MG capsule Take 1 capsule (20 mg total) by mouth daily. 90 capsule 3  . pravastatin (PRAVACHOL) 40 MG tablet Take 1 tablet (40 mg total) by mouth daily. 90 tablet 3  . triamcinolone cream (KENALOG) 0.1 % Apply 1 application topically 2 (two) times daily as needed. 45 g 1   No current facility-administered medications for this visit.      Allergies:  Allergies  Allergen Reactions  . Morphine And Related     Itching & rash Because of a history of documented adverse serious drug reaction;Medi Alert bracelet  is recommended    Past Medical History, Surgical history, Social history, and Family History were reviewed and updated.  Physical Exam: Blood pressure (!) 170/65, pulse 71, temperature 97.8 F (36.6 C), temperature source Oral, resp.  rate 18, height 5\' 7"  (1.702 m), weight 187 lb 12.8 oz (85.2 kg), SpO2 99 %.   ECOG: 0 General appearance: alert and cooperative Head: Normocephalic, without obvious abnormality without oral thrush. Neck: no adenopathy Lymph nodes: Cervical, supraclavicular, and axillary nodes normal. Heart:regular rate and rhythm, S1, S2 normal, no murmur, click, rub or gallop Lung: Clear throughout auscultation. No  wheezes or dullness to percussion. Abdomin: soft, non-tender, without masses or organomegaly EXT:no erythema, induration, or nodules   Lab Results: Lab Results  Component Value Date   WBC 4.8 02/20/2017   HGB 13.9 02/20/2017   HCT 41.5 02/20/2017   MCV 90.4 02/20/2017   PLT 222 02/20/2017     Chemistry      Component Value Date/Time   NA 141 04/19/2016 1055   K 3.4 (L) 04/19/2016 1055   CL 108 04/19/2016 1055   CO2 26 04/19/2016 1055   BUN 13 04/19/2016 1055   CREATININE 1.07 04/19/2016 1055      Component Value Date/Time   CALCIUM 9.0 04/19/2016 1055   ALKPHOS 69 04/19/2016 1055   AST 16 04/19/2016 1055   ALT 13 04/19/2016 1055   BILITOT 0.9 04/19/2016 1055     Results for Thomas, Hudson (MRN [id number]) as of 02/20/2017 09:57  Ref. Range 11/19/2016 11:31  Iron Latest Ref Range: 42 - 163 ug/dL 76  UIBC Latest Ref Range: 117 - 376 ug/dL 319  TIBC Latest Ref Range: 202 - 409 ug/dL 395  %SAT Latest Ref Range: 20 - 55 % 19 (L)  Ferritin Latest Ref Range: 22 - 316 ng/ml 9 (L)     Impression and Plan:  76 year old gentleman with the following issues:  1. 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 His hemoglobin in March 2018 was 9.7 with an MCV of 77 and an RDW of 17.7. These findings suggest iron deficiency anemia.  The etiology of his iron deficiency anemia could be related to his Whipple procedure and poor iron absorption. He did have a duodenal adenoma that was resected recently which could have contributed to his iron deficiency.  He is status post IV iron completed in May 2018 with normalization of his hemoglobin, MCV and likely iron studies.  I recommended continuing oral iron for the time being and repeat IV iron in the future if needed to. He gets his iron checked periodically by his primary care provider and he will report back to me in the future as needed.  B-12 levels are all within normal range and might require B12  supplements of the future if needed to.  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  4. Rheumatoid arthritis: He has been on methotrexate that was withheld. He continues to follow with rheumatology.  5. Follow-up: Will be as needed in the future.    [facility], MD 8/16/201810:07 AM

## 2017-02-20 NOTE — Telephone Encounter (Signed)
No los per 8/16  

## 2017-04-01 DIAGNOSIS — Z79899 Other long term (current) drug therapy: Secondary | ICD-10-CM | POA: Diagnosis not present

## 2017-04-01 DIAGNOSIS — M549 Dorsalgia, unspecified: Secondary | ICD-10-CM | POA: Diagnosis not present

## 2017-04-01 DIAGNOSIS — M1990 Unspecified osteoarthritis, unspecified site: Secondary | ICD-10-CM | POA: Diagnosis not present

## 2017-04-07 DIAGNOSIS — N401 Enlarged prostate with lower urinary tract symptoms: Secondary | ICD-10-CM | POA: Diagnosis not present

## 2017-04-07 DIAGNOSIS — R3912 Poor urinary stream: Secondary | ICD-10-CM | POA: Diagnosis not present

## 2017-04-21 DIAGNOSIS — K317 Polyp of stomach and duodenum: Secondary | ICD-10-CM | POA: Diagnosis not present

## 2017-04-21 DIAGNOSIS — D490 Neoplasm of unspecified behavior of digestive system: Secondary | ICD-10-CM | POA: Diagnosis not present

## 2017-04-21 DIAGNOSIS — D135 Benign neoplasm of extrahepatic bile ducts: Secondary | ICD-10-CM | POA: Diagnosis not present

## 2017-04-23 ENCOUNTER — Encounter: Payer: Medicare Other | Admitting: Internal Medicine

## 2017-05-02 ENCOUNTER — Ambulatory Visit (INDEPENDENT_AMBULATORY_CARE_PROVIDER_SITE_OTHER): Payer: Medicare Other | Admitting: Internal Medicine

## 2017-05-02 ENCOUNTER — Encounter: Payer: Self-pay | Admitting: Internal Medicine

## 2017-05-02 ENCOUNTER — Telehealth: Payer: Self-pay

## 2017-05-02 ENCOUNTER — Other Ambulatory Visit: Payer: Self-pay | Admitting: Internal Medicine

## 2017-05-02 VITALS — BP 116/68 | HR 90 | Temp 97.8°F | Ht 66.5 in | Wt 188.0 lb

## 2017-05-02 DIAGNOSIS — M06049 Rheumatoid arthritis without rheumatoid factor, unspecified hand: Secondary | ICD-10-CM

## 2017-05-02 DIAGNOSIS — K219 Gastro-esophageal reflux disease without esophagitis: Secondary | ICD-10-CM

## 2017-05-02 DIAGNOSIS — N401 Enlarged prostate with lower urinary tract symptoms: Secondary | ICD-10-CM | POA: Diagnosis not present

## 2017-05-02 DIAGNOSIS — J302 Other seasonal allergic rhinitis: Secondary | ICD-10-CM | POA: Diagnosis not present

## 2017-05-02 DIAGNOSIS — Z0000 Encounter for general adult medical examination without abnormal findings: Secondary | ICD-10-CM

## 2017-05-02 DIAGNOSIS — Z23 Encounter for immunization: Secondary | ICD-10-CM | POA: Diagnosis not present

## 2017-05-02 DIAGNOSIS — I10 Essential (primary) hypertension: Secondary | ICD-10-CM | POA: Diagnosis not present

## 2017-05-02 DIAGNOSIS — E876 Hypokalemia: Secondary | ICD-10-CM

## 2017-05-02 LAB — COMPREHENSIVE METABOLIC PANEL
ALT: 8 U/L (ref 0–53)
AST: 12 U/L (ref 0–37)
Albumin: 3.8 g/dL (ref 3.5–5.2)
Alkaline Phosphatase: 68 U/L (ref 39–117)
BILIRUBIN TOTAL: 0.8 mg/dL (ref 0.2–1.2)
BUN: 10 mg/dL (ref 6–23)
CALCIUM: 9.1 mg/dL (ref 8.4–10.5)
CO2: 27 meq/L (ref 19–32)
CREATININE: 1 mg/dL (ref 0.40–1.50)
Chloride: 107 mEq/L (ref 96–112)
GFR: 77.57 mL/min (ref >60.00)
Glucose, Bld: 114 mg/dL — ABNORMAL HIGH (ref 70–99)
Potassium: 3.1 mEq/L — ABNORMAL LOW (ref 3.5–5.1)
Sodium: 140 mEq/L (ref 135–145)
TOTAL PROTEIN: 6.3 g/dL (ref 6.0–8.3)

## 2017-05-02 LAB — CBC
HCT: 41.6 % (ref 39.0–52.0)
Hemoglobin: 14.1 g/dL (ref 13.0–17.0)
MCHC: 34 g/dL (ref 30.0–36.0)
MCV: 89.1 fl (ref 78.0–100.0)
Platelets: 230 10*3/uL (ref 150.0–400.0)
RBC: 4.67 Mil/uL (ref 4.22–5.81)
RDW: 12.8 % (ref 11.5–15.5)
WBC: 7 10*3/uL (ref 4.0–10.5)

## 2017-05-02 LAB — LIPID PANEL
CHOL/HDL RATIO: 4
CHOLESTEROL: 127 mg/dL (ref 0–200)
HDL: 34.5 mg/dL — ABNORMAL LOW (ref >39.00)
LDL Cholesterol: 67 mg/dL (ref 0–99)
NonHDL: 92.95
TRIGLYCERIDES: 130 mg/dL (ref 0.0–149.0)
VLDL: 26 mg/dL (ref 0.0–40.0)

## 2017-05-02 MED ORDER — AMLODIPINE BESYLATE 5 MG PO TABS: 5.0000 mg | ORAL_TABLET | Freq: Every day | ORAL | 3 refills | Status: DC

## 2017-05-02 MED ORDER — LOSARTAN POTASSIUM 100 MG PO TABS: 100.0000 mg | ORAL_TABLET | Freq: Every day | ORAL | 3 refills | Status: DC

## 2017-05-02 MED ORDER — CETIRIZINE HCL 10 MG PO TABS: 10.0000 mg | ORAL_TABLET | Freq: Every day | ORAL | 3 refills | Status: DC

## 2017-05-02 MED ORDER — FLUTICASONE PROPIONATE 50 MCG/ACT NA SUSP: 2.0000 | Freq: Every day | NASAL | 3 refills | Status: DC | PRN

## 2017-05-02 MED ORDER — OMEPRAZOLE 20 MG PO CPDR: 20.0000 mg | DELAYED_RELEASE_CAPSULE | Freq: Every day | ORAL | 3 refills | Status: DC

## 2017-05-02 MED ORDER — FINASTERIDE 5 MG PO TABS: 5.0000 mg | ORAL_TABLET | Freq: Every day | ORAL | 3 refills | Status: DC

## 2017-05-02 MED ORDER — FERROUS SULFATE 325 (65 FE) MG PO TABS: 325.0000 mg | ORAL_TABLET | Freq: Three times a day (TID) | ORAL | 3 refills | Status: DC

## 2017-05-02 MED ORDER — MONTELUKAST SODIUM 10 MG PO TABS: 10.0000 mg | ORAL_TABLET | Freq: Every day | ORAL | 3 refills | Status: DC

## 2017-05-02 MED ORDER — PRAVASTATIN SODIUM 40 MG PO TABS: 40.0000 mg | ORAL_TABLET | Freq: Every day | ORAL | 3 refills | Status: DC

## 2017-05-02 MED ORDER — ALFUZOSIN HCL ER 10 MG PO TB24: 10.0000 mg | ORAL_TABLET | Freq: Every day | ORAL | 3 refills | Status: DC

## 2017-05-02 NOTE — Assessment & Plan Note (Signed)
Okay with the Rx

## 2017-05-02 NOTE — Assessment & Plan Note (Signed)
I have personally reviewed the Medicare Annual Wellness questionnaire and have noted 1. The patient's medical and social history 2. Their use of alcohol, tobacco or illicit drugs 3. Their current medications and supplements 4. The patient's functional ability including ADL's, fall risks, home safety risks and hearing or visual             impairment. 5. Diet and physical activities 6. Evidence for depression or mood disorders  The patients weight, height, BMI and visual acuity have been recorded in the chart I have made referrals, counseling and provided education to the patient based review of the above and I have provided the pt with a written personalized care plan for preventive services.  I have provided you with a copy of your personalized plan for preventive services. Please take the time to review along with your updated medication list.  Will update pneumovax and give flu vaccine Done with colon cancer screening--recent colon Still getting PSA with urologist--discussed Keep up with exercise Consider shingrix

## 2017-05-02 NOTE — Telephone Encounter (Signed)
Pt. Will come back by office to pick up that printed prescription. Thanks.

## 2017-05-02 NOTE — Assessment & Plan Note (Signed)
Probably sinus related

## 2017-05-02 NOTE — Assessment & Plan Note (Signed)
Symptoms controlled with PPI Will need surveillance EGD due to adenoma

## 2017-05-02 NOTE — Assessment & Plan Note (Signed)
BP Readings from Last 3 Encounters:  05/02/17 116/68  02/20/17 (!) 170/65  12/04/16 (!) 150/73   Good control now No changes

## 2017-05-02 NOTE — Addendum Note (Signed)
Addended by: Pilar Grammes on: 05/02/2017 11:00 AM   Modules accepted: Orders

## 2017-05-02 NOTE — Progress Notes (Signed)
Subjective:    Patient ID: Thomas Hudson, male    DOB: 21-Mar-1941, 76 y.o.   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 No dysphagia  Still with chronic cough Diagnosed with chronic sinusitis---ENT recommended surgery He doesn't want this Remains on singulair, flonase and zyrtec Did see pulmonary--not from lung/bronchial  Off the methotrexate and folic acid Still has hand stiffness and aching at times Some ankle pain and occasional low back pain to right hip Not bad enough to take the risk with the medication Still sees Dr Dossie Der  No chest pain No SOB --other than upper airway congestion No dizziness or syncope Mild ankle edema and lower calves--- end of day. Better in AM  Current Outpatient Prescriptions on File Prior to Visit  Medication Sig Dispense Refill  . amLODipine (NORVASC) 5 MG tablet Take 1 tablet (5 mg total) by mouth daily. 90 tablet 3  . cetirizine (ZYRTEC) 10 MG tablet Take 1 tablet (10 mg total) by mouth daily. 90 tablet 3  . ferrous sulfate 325 (65 FE) MG tablet Take 1 tablet (325 mg total) by mouth 3 (three) times daily with meals. 270 tablet 3  . finasteride (PROSCAR) 5 MG tablet Take 1 tablet (5 mg total) by mouth daily. 90 tablet 3  . fluticasone (FLONASE) 50 MCG/ACT nasal spray Place 2 sprays into both nostrils daily. (Patient taking differently: Place 2 sprays into both nostrils daily as needed for allergies. )  48 g 3  . losartan (COZAAR) 100 MG tablet Take 1 tablet (100 mg total) by mouth daily. 90 tablet 3  . montelukast (SINGULAIR) 10 MG tablet Take 1 tablet (10 mg total) by mouth at bedtime. 90 tablet 3  . omeprazole (PRILOSEC) 20 MG capsule Take 1 capsule (20 mg total) by mouth daily. 90 capsule 3  . pravastatin (PRAVACHOL) 40 MG tablet Take 1 tablet (40 mg total) by mouth daily. 90 tablet 3  . triamcinolone cream (KENALOG) 0.1 % Apply 1 application topically 2 (two) times daily as needed. 45 g 1   No current facility-administered medications on file prior to visit.     Allergies  Allergen Reactions  . Morphine And Related     Itching & rash Because of a history of documented adverse serious drug reaction;Medi Alert bracelet  is recommended    Past Medical History:  Diagnosis Date  . BPH (benign prostatic hyperplasia)   . Carpal tunnel syndrome on both sides   . GERD (gastroesophageal reflux disease)   . Hyperlipidemia   . Hypertension   . RAD (reactive airway disease)    no PMH of asthma; RAD post infection  . Rheumatoid arthritis [facility])         Past Surgical History:  Procedure Laterality Date  . COLONOSCOPY W/ POLYPECTOMY  2009   X 2; Dr.Perry   . ERCP N/A 11/05/2016   Procedure: ENDOSCOPIC RETROGRADE CHOLANGIOPANCREATOGRAPHY (ERCP);  Surgeon: Irene Shipper, MD;  Location: [facility] ENDOSCOPY;  Service: Endoscopy;  Laterality: N/A;  .  ESOPHAGOGASTRODUODENOSCOPY (EGD) WITH PROPOFOL N/A 07/29/2016   Procedure: ESOPHAGOGASTRODUODENOSCOPY (EGD) WITH PROPOFOL;  Surgeon: Irene Shipper, MD;  Location: [facility] ENDOSCOPY;  Service: Endoscopy;  Laterality: N/A;  will need ERCP scope  . HERNIA REPAIR    . LUMBAR LAMINECTOMY  07/2008  . UPPER GASTROINTESTINAL ENDOSCOPY      gastric polyp;Dr.Perry   . VASECTOMY    . WHIPPLE PROCEDURE  2012   partial ; [facility]    Family History  Problem Relation Age of Onset  . Throat cancer Father        smoker  . Esophageal cancer Father   . Prostate cancer  Paternal Uncle   . Heart attack Paternal Uncle        3 uncles, >55  . Coronary artery disease Brother        in 39s  . Esophageal cancer Brother   . Skin cancer Mother   . Throat cancer Brother   . Uterine cancer Sister   . COPD Sister        X80  . Ovarian cancer Sister   . Stroke Paternal Grandmother 58  . Colon cancer Paternal Grandfather   . Heart disease Paternal Grandfather   . Heart disease Maternal Grandfather   . Throat cancer Sister        smoker  . Lung cancer Sister        smoker  . Leukemia Sister   . Asthma Neg Hx     Social History   Social History  . Marital status: Married    Spouse name: N/A  . Number of children: 2  . Years of education: N/A   Occupational History  . 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 Smoking status: Former Smoker    Packs/day: 1.00    Years: 50.00    Types: Cigarettes    Quit date: 07/08/1997  . Smokeless tobacco: Never Used     Comment: smoked age [age], up to 1 ppd  . Alcohol use No  . Drug use: No  . Sexual activity: Yes   Other Topics Concern  . Not on file   Social History Narrative   No living will   Requests wife as health care POA--- then daughters   Would accept resuscitation attempts   Not sure about tube feeds   Review of Systems Appetite is okay Weight is stable Sleeps okay Wears seat belt Teeth are soft---keeps up with dentist Bowels slow--using stool softener which helps. Iron constipates him. No blood Voids okay on Rx. Keeps up with urologist No rash or suspicious lesions. Still has spots on vertex---neosporin helps. Recommended dermatologist     Objective:   Physical Exam  Constitutional: He is oriented to person, place, and time. He appears well-developed. No distress.  HENT:  Mouth/Throat: Oropharynx is clear and moist. No oropharyngeal exudate.  Neck: No thyromegaly present.  Cardiovascular: Normal rate, regular rhythm, normal heart sounds and intact distal pulses.   Exam reveals no gallop.   No murmur heard. Pulmonary/Chest: Effort normal and breath sounds normal. No respiratory distress. He has no wheezes. He has no rales.  Abdominal: Soft. He exhibits no distension. There is no tenderness.  Musculoskeletal: He exhibits no edema or tenderness.  No active synovitis  Lymphadenopathy:    He has no cervical adenopathy.  Neurological: He is alert and oriented to person, place, and time.  President-- "Donetta Potts--- Bush" 100-93-?  "not good at math" D-l-r-o-w Recall 2/3  Skin: No rash noted. No erythema.  Psychiatric: He has a normal mood and affect. His behavior is normal.          Assessment & Plan:

## 2017-05-02 NOTE — Assessment & Plan Note (Signed)
See social history 

## 2017-05-02 NOTE — Patient Instructions (Addendum)
You can stop the iron if your blood count is normal.  DASH Eating Plan DASH stands for "Dietary Approaches to Stop Hypertension." The DASH eating plan is a healthy eating plan that has been shown to reduce high blood pressure (hypertension). It may also reduce your risk for type 2 diabetes, heart disease, and stroke. The DASH eating plan may also help with weight loss. What are tips for following this plan? General guidelines  Avoid eating more than 2,300 mg (milligrams) of salt (sodium) a day. If you have hypertension, you may need to reduce your sodium intake to 1,500 mg a day.  Limit alcohol intake to no more than 1 drink a day for nonpregnant women and 2 drinks a day for men. One drink equals 12 oz of beer, 5 oz of wine, or 1 oz of hard liquor.  Work with your health care provider to maintain a healthy body weight or to lose weight. Ask what an ideal weight is for you.  Get at least 30 minutes of exercise that causes your heart to beat faster (aerobic exercise) most days of the week. Activities may include walking, swimming, or biking.  Work with your health care provider or diet and nutrition specialist (dietitian) to adjust your eating plan to your individual calorie needs. Reading food labels  Check food labels for the amount of sodium per serving. Choose foods with less than 5 percent of the Daily Value of sodium. Generally, foods with less than 300 mg of sodium per serving fit into this eating plan.  To find whole grains, look for the word "whole" as the first word in the ingredient list. Shopping  Buy products labeled as "low-sodium" or "no salt added."  Buy fresh foods. Avoid canned foods and premade or frozen meals. Cooking  Avoid adding salt when cooking. Use salt-free seasonings or herbs instead of table salt or sea salt. Check with your health care provider or pharmacist before using salt substitutes.  Do not fry foods. Cook foods using healthy methods such as baking,  boiling, grilling, and broiling instead.  Cook with heart-healthy oils, such as olive, canola, soybean, or sunflower oil. Meal planning   Eat a balanced diet that includes: ? 5 or more servings of fruits and vegetables each day. At each meal, try to fill half of your plate with fruits and vegetables. ? Up to 6-8 servings of whole grains each day. ? Less than 6 oz of lean meat, poultry, or fish each day. A 3-oz serving of meat is about the same size as a deck of cards. One egg equals 1 oz. ? 2 servings of low-fat dairy each day. ? A serving of nuts, seeds, or beans 5 times each week. ? Heart-healthy fats. Healthy fats called Omega-3 fatty acids are found in foods such as flaxseeds and coldwater fish, like sardines, salmon, and mackerel.  Limit how much you eat of the following: ? Canned or prepackaged foods. ? Food that is high in trans fat, such as fried foods. ? Food that is high in saturated fat, such as fatty meat. ? Sweets, desserts, sugary drinks, and other foods with added sugar. ? Full-fat dairy products.  Do not salt foods before eating.  Try to eat at least 2 vegetarian meals each week.  Eat more home-cooked food and less restaurant, buffet, and fast food.  When eating at a restaurant, ask that your food be prepared with less salt or no salt, if possible. What foods are recommended? The items listed  may not be a complete list. Talk with your dietitian about what dietary choices are best for you. Grains Whole-grain or whole-wheat bread. Whole-grain or whole-wheat pasta. Brown rice. Modena Morrow. Bulgur. Whole-grain and low-sodium cereals. Pita bread. Low-fat, low-sodium crackers. Whole-wheat flour tortillas. Vegetables Fresh or frozen vegetables (raw, steamed, roasted, or grilled). Low-sodium or reduced-sodium tomato and vegetable juice. Low-sodium or reduced-sodium tomato sauce and tomato paste. Low-sodium or reduced-sodium canned vegetables. Fruits All fresh, dried, or  frozen fruit. Canned fruit in natural juice (without added sugar). Meat and other protein foods Skinless chicken or [place]. Ground chicken or [place]. Pork with fat trimmed off. Fish and seafood. Egg whites. Dried beans, peas, or lentils. Unsalted nuts, nut butters, and seeds. Unsalted canned beans. Lean cuts of beef with fat trimmed off. Low-sodium, lean deli meat. Dairy Low-fat (1%) or fat-free (skim) milk. Fat-free, low-fat, or reduced-fat cheeses. Nonfat, low-sodium ricotta or cottage cheese. Low-fat or nonfat yogurt. Low-fat, low-sodium cheese. Fats and oils Soft margarine without trans fats. Vegetable oil. Low-fat, reduced-fat, or light mayonnaise and salad dressings (reduced-sodium). Canola, safflower, olive, soybean, and sunflower oils. Avocado. Seasoning and other foods Herbs. Spices. Seasoning mixes without salt. Unsalted popcorn and pretzels. Fat-free sweets. What foods are not recommended? The items listed may not be a complete list. Talk with your dietitian about what dietary choices are best for you. Grains Baked goods made with fat, such as croissants, muffins, or some breads. Dry pasta or rice meal packs. Vegetables Creamed or fried vegetables. Vegetables in a cheese sauce. Regular canned vegetables (not low-sodium or reduced-sodium). Regular canned tomato sauce and paste (not low-sodium or reduced-sodium). Regular tomato and vegetable juice (not low-sodium or reduced-sodium). Angie Fava. Olives. Fruits Canned fruit in a light or heavy syrup. Fried fruit. Fruit in cream or butter sauce. Meat and other protein foods Fatty cuts of meat. Ribs. Fried meat. Berniece Salines. Sausage. Bologna and other processed lunch meats. Salami. Fatback. Hotdogs. Bratwurst. Salted nuts and seeds. Canned beans with added salt. Canned or smoked fish. Whole eggs or egg yolks. Chicken or [place] with skin. Dairy Whole or 2% milk, cream, and half-and-half. Whole or full-fat cream cheese. Whole-fat or sweetened yogurt.  Full-fat cheese. Nondairy creamers. Whipped toppings. Processed cheese and cheese spreads. Fats and oils Butter. Stick margarine. Lard. Shortening. Ghee. Bacon fat. Tropical oils, such as coconut, palm kernel, or palm oil. Seasoning and other foods Salted popcorn and pretzels. Onion salt, garlic salt, seasoned salt, table salt, and sea salt. Worcestershire sauce. Tartar sauce. Barbecue sauce. Teriyaki sauce. Soy sauce, including reduced-sodium. Steak sauce. Canned and packaged gravies. Fish sauce. Oyster sauce. Cocktail sauce. Horseradish that you find on the shelf. Ketchup. Mustard. Meat flavorings and tenderizers. Bouillon cubes. Hot sauce and Tabasco sauce. Premade or packaged marinades. Premade or packaged taco seasonings. Relishes. Regular salad dressings. Where to find more information:  National Heart, Lung, and [place]: https://wilson-eaton.com/  American Heart Association: www.heart.org Summary  The DASH eating plan is a healthy eating plan that has been shown to reduce high blood pressure (hypertension). It may also reduce your risk for type 2 diabetes, heart disease, and stroke.  With the DASH eating plan, you should limit salt (sodium) intake to 2,300 mg a day. If you have hypertension, you may need to reduce your sodium intake to 1,500 mg a day.  When on the DASH eating plan, aim to eat more fresh fruits and vegetables, whole grains, lean proteins, low-fat dairy, and heart-healthy fats.  Work with your health care provider or diet  and nutrition specialist (dietitian) to adjust your eating plan to your individual calorie needs. This information is not intended to replace advice given to you by your health care provider. Make sure you discuss any questions you have with your health care provider. Document Released: 06/13/2011 Document Revised: 06/17/2016 Document Reviewed: 06/17/2016 Elsevier Interactive Patient Education  2017 [employer].

## 2017-05-02 NOTE — Assessment & Plan Note (Signed)
Trying off MTX Seems to be okay for now

## 2017-05-02 NOTE — Telephone Encounter (Signed)
I realized a rx was not printed from his appt this morning. I have the rx signed and need to know if he wants me to mail it to him or if he wants to come get it.

## 2017-05-02 NOTE — Telephone Encounter (Signed)
Please leave for him out front

## 2017-05-05 ENCOUNTER — Telehealth: Payer: Self-pay

## 2017-05-05 MED ORDER — POTASSIUM CHLORIDE CRYS ER 20 MEQ PO TBCR: 20.0000 meq | EXTENDED_RELEASE_TABLET | Freq: Every day | ORAL | 3 refills | Status: DC

## 2017-05-05 NOTE — Telephone Encounter (Signed)
Pt needs a written rx to take to [facility] to be filled. I have looked in the medication database for 20 meq under potassium chloride and Klor-Con. The only one I keep finding that is not a packet is the crystals ER.

## 2017-05-05 NOTE — Telephone Encounter (Signed)
Rx signed and up front

## 2017-05-07 IMAGING — DX DG CHEST 2V
2 series · 2 of 2 positions shown · non-contrast
Comparison: Chest x-ray of October 31, 2015

CLINICAL DATA: Ten months of cough, history of chronic bronchitis,
hyperlipidemia, and hypertension

EXAM:
CHEST  2 VIEW

[chest pa]
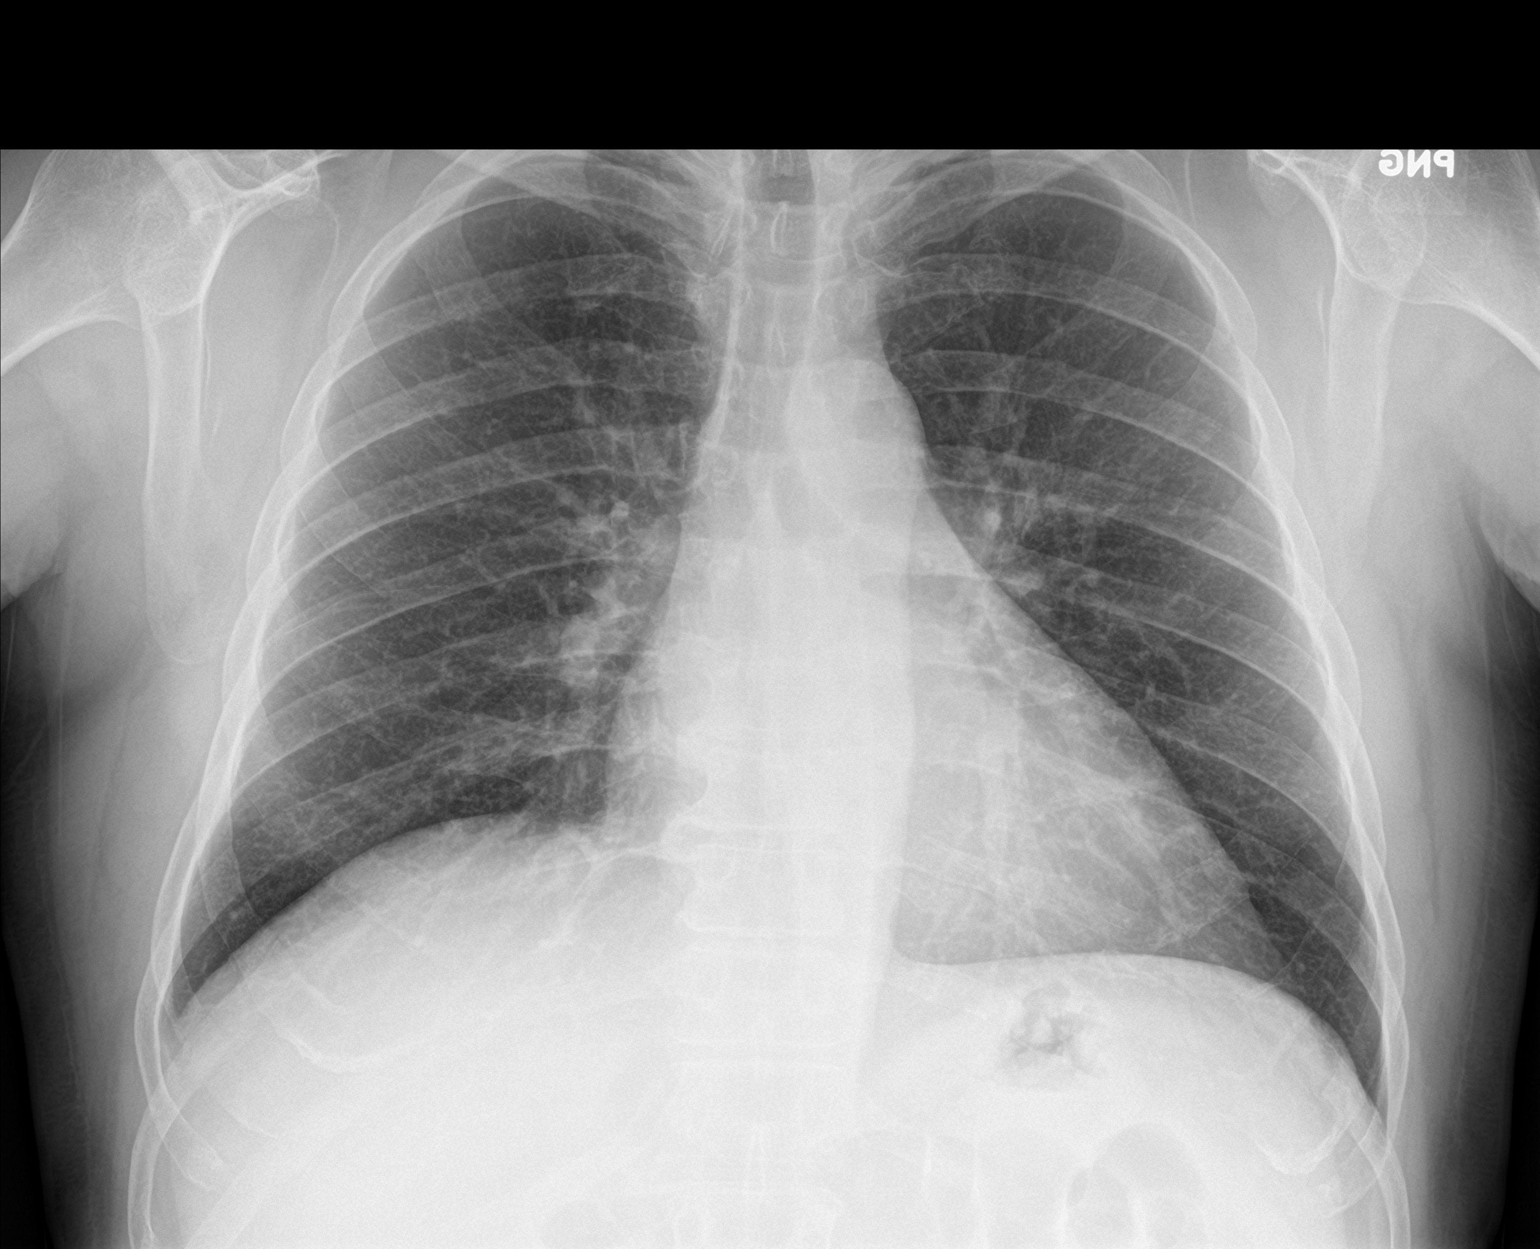

[chest lat]
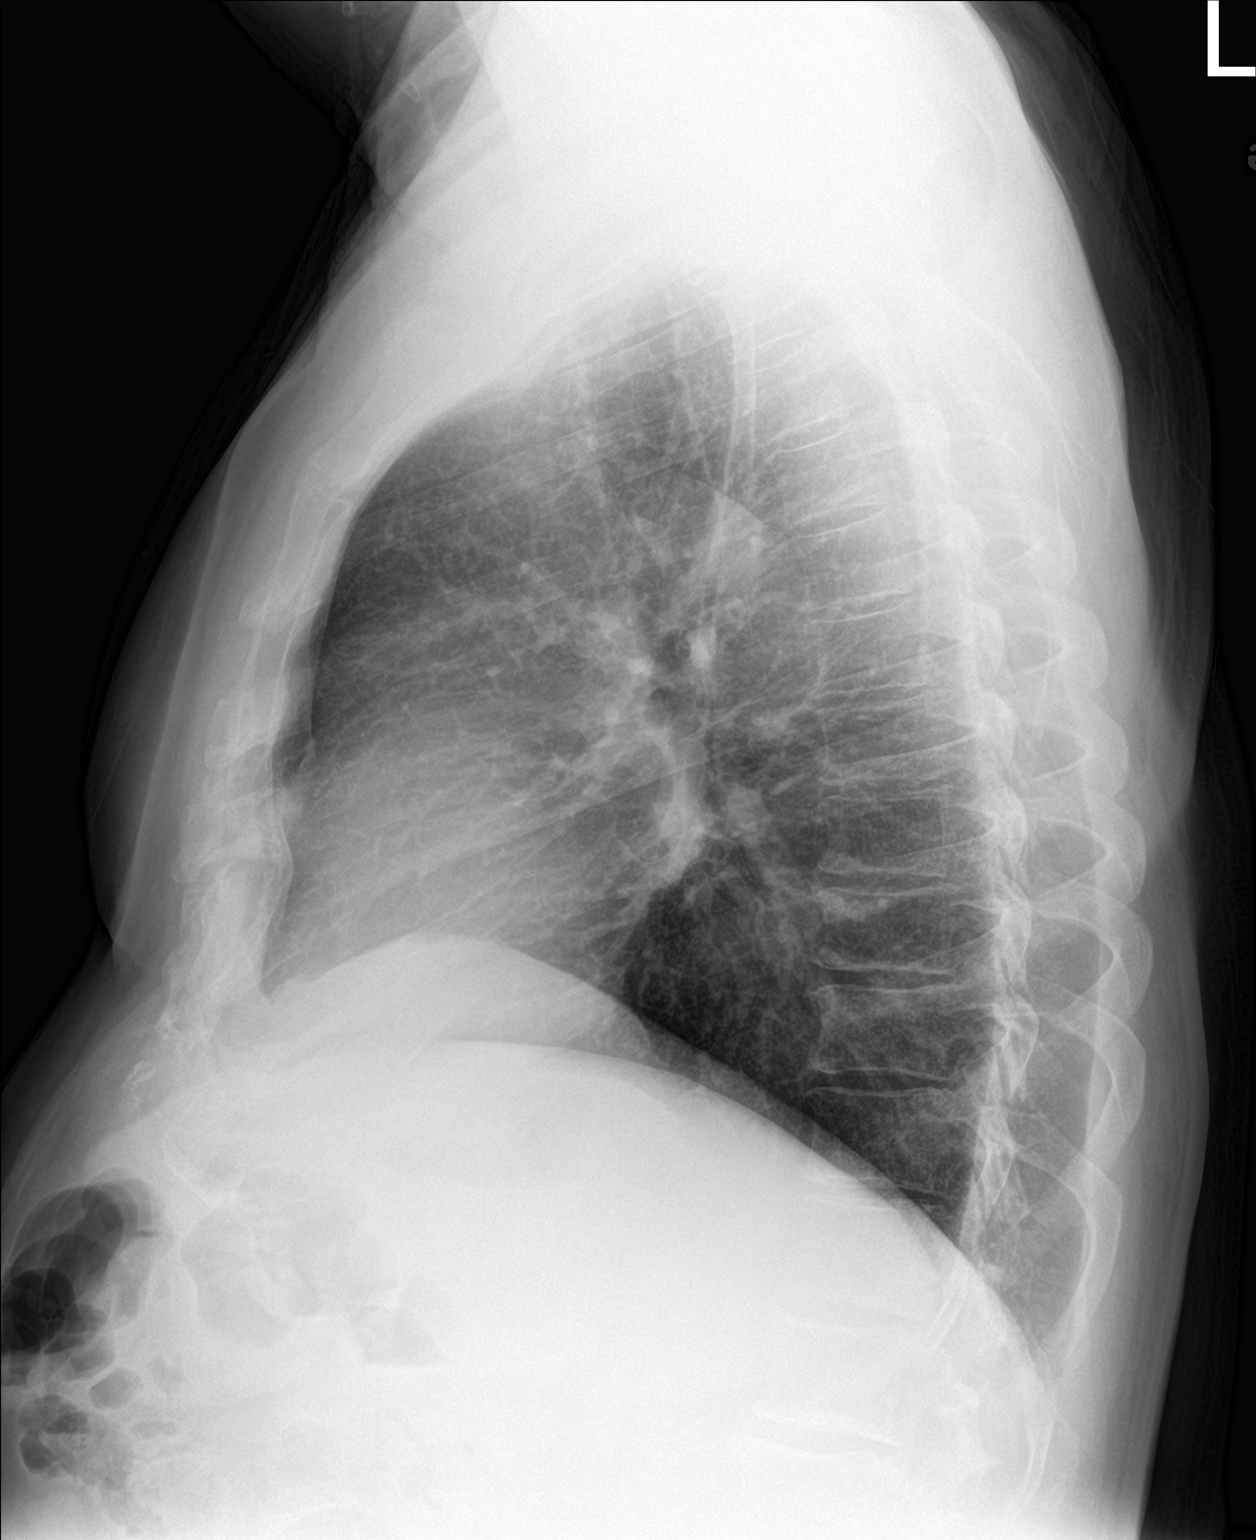

[2 of 2 positions shown; findings below may reference images not displayed]

FINDINGS: The lungs are adequately inflated. There is no focal infiltrate. The
interstitial markings are coarse though stable There is no pleural
effusion. The heart is top-normal in size. The pulmonary vascularity
is mildly prominent centrally. The mediastinum is normal in width.
The bony thorax exhibits no acute abnormality.
IMPRESSION: No active cardiopulmonary disease. Mild interstitial prominence is
consistent with chronic bronchitis.

## 2017-05-13 IMAGING — CT CT PARANASAL SINUSES LIMITED
1 of 2 series · 8 of 11 positions shown, 10 images · non-contrast
Comparison: CT sinus 12/09/2011

CLINICAL DATA: Upper airway cough syndrome

EXAM:
CT PARANASAL SINUS LIMITED WITHOUT CONTRAST
TECHNIQUE: Non-contiguous multidetector CT images of the paranasal sinuses were
obtained in a single plane without contrast.

[Series 4: limited sinus st · axial · 0.28mm/px · z∈[+108,+178]mm · 8 of 10 slices shown, 10 images]
[im 2/10  brain]
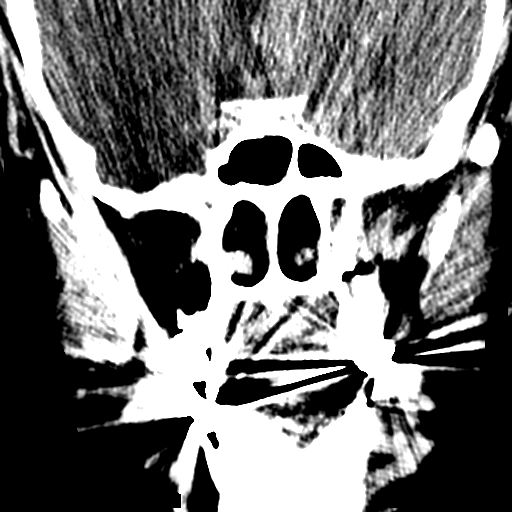
[im 2/10  bone]
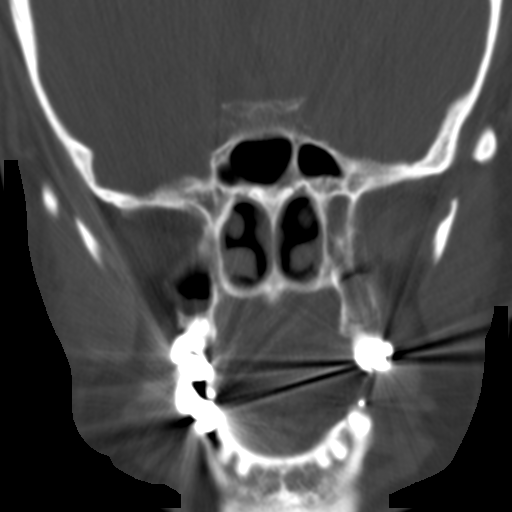
[im 3/10  bone]
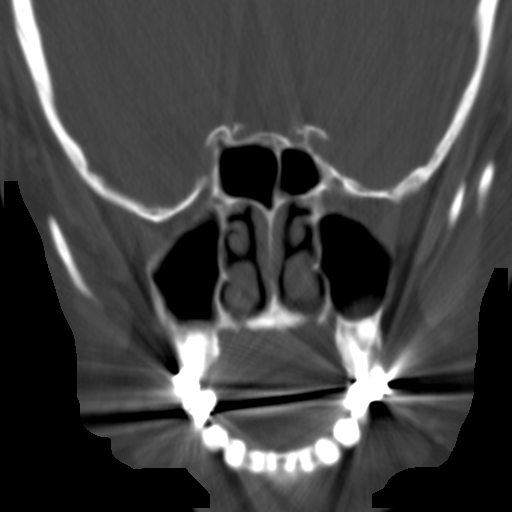
[im 4/10  bone]
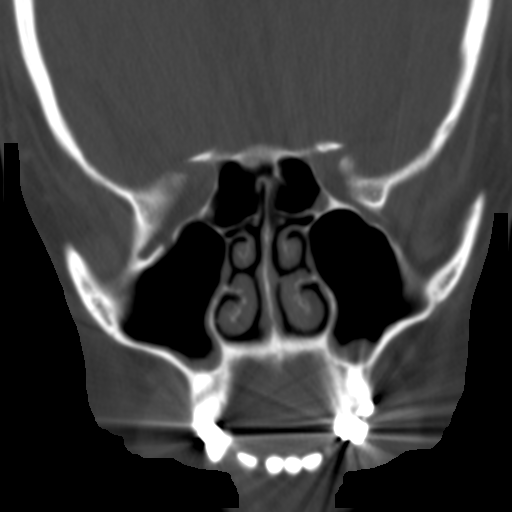
[im 5/10  bone]
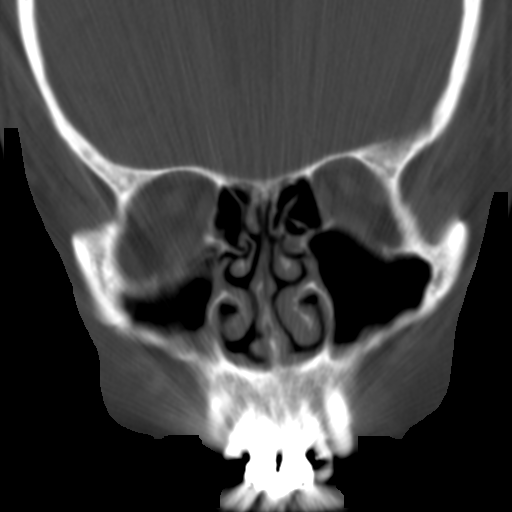
[im 6/10  brain]
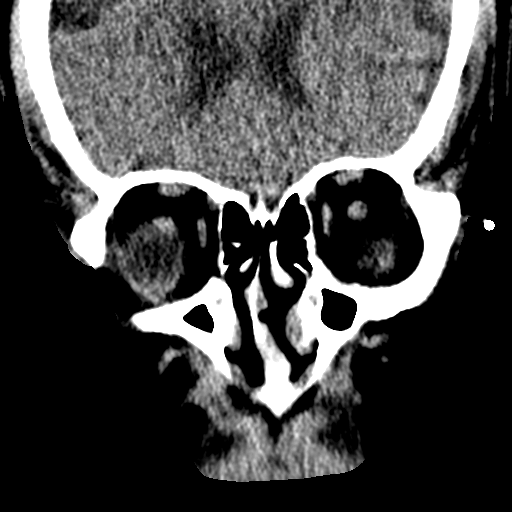
[im 6/10  bone]
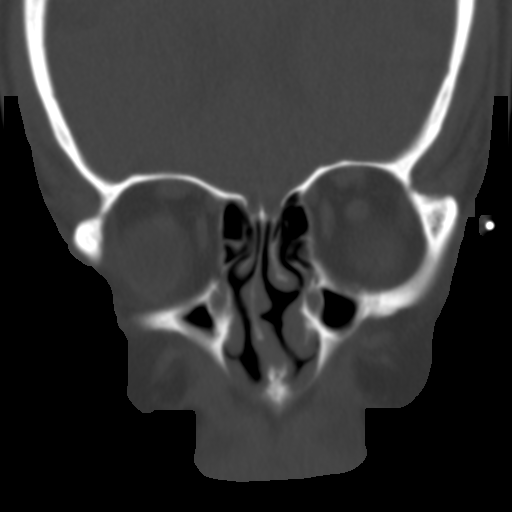
[im 7/10  bone]
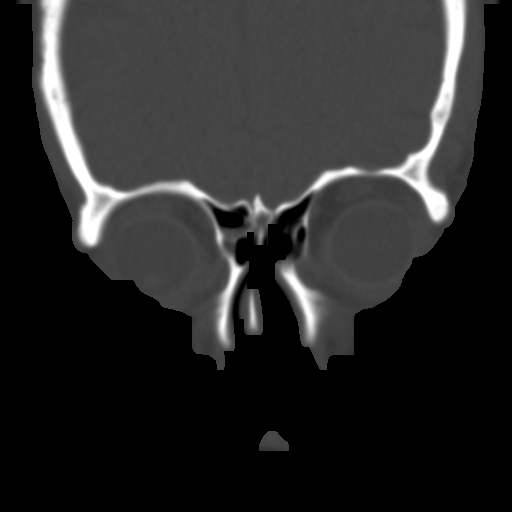
[im 8/10  bone]
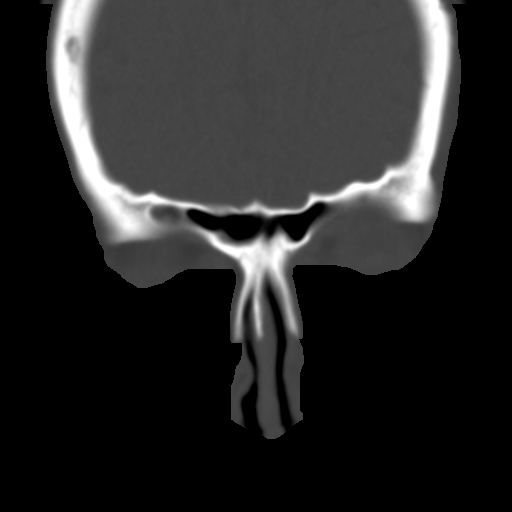
[im 9/10  bone]
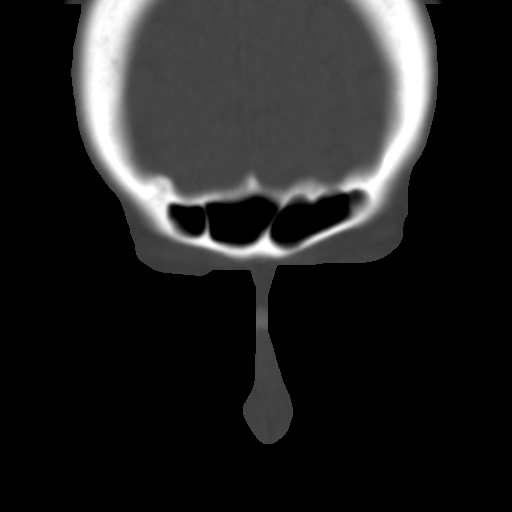

[8 of 11 positions shown; findings below may reference images not displayed]

FINDINGS: Mild mucosal edema throughout the paranasal sinuses. Mucosal edema
is present in the frontal, ethmoid, maxillary, and sphenoid sinus.
No air-fluid level.

Nasal septum moderately deviated to the left. No acute bony
abnormality

Comparison with prior study reveals interval improvement. Previously
there or air-fluid levels in the maxillary sinus bilaterally.
IMPRESSION: Mild-to-moderate mucosal thickening throughout the paranasal
sinuses. No air-fluid level.

## 2017-05-26 ENCOUNTER — Other Ambulatory Visit (INDEPENDENT_AMBULATORY_CARE_PROVIDER_SITE_OTHER): Payer: Medicare Other

## 2017-05-26 DIAGNOSIS — E876 Hypokalemia: Secondary | ICD-10-CM | POA: Diagnosis not present

## 2017-05-26 LAB — POTASSIUM: Potassium: 4 mEq/L (ref 3.5–5.1)

## 2017-07-22 DIAGNOSIS — Z79899 Other long term (current) drug therapy: Secondary | ICD-10-CM | POA: Diagnosis not present

## 2017-07-22 DIAGNOSIS — M1990 Unspecified osteoarthritis, unspecified site: Secondary | ICD-10-CM | POA: Diagnosis not present

## 2017-07-22 DIAGNOSIS — M7581 Other shoulder lesions, right shoulder: Secondary | ICD-10-CM | POA: Diagnosis not present

## 2017-07-24 DIAGNOSIS — D485 Neoplasm of uncertain behavior of skin: Secondary | ICD-10-CM | POA: Diagnosis not present

## 2017-07-24 DIAGNOSIS — L281 Prurigo nodularis: Secondary | ICD-10-CM | POA: Diagnosis not present

## 2017-07-24 DIAGNOSIS — C44319 Basal cell carcinoma of skin of other parts of face: Secondary | ICD-10-CM | POA: Diagnosis not present

## 2017-07-24 DIAGNOSIS — L718 Other rosacea: Secondary | ICD-10-CM | POA: Diagnosis not present

## 2017-07-24 DIAGNOSIS — L821 Other seborrheic keratosis: Secondary | ICD-10-CM | POA: Diagnosis not present

## 2017-07-24 DIAGNOSIS — L720 Epidermal cyst: Secondary | ICD-10-CM | POA: Diagnosis not present

## 2017-10-16 DIAGNOSIS — H2513 Age-related nuclear cataract, bilateral: Secondary | ICD-10-CM | POA: Diagnosis not present

## 2017-11-21 ENCOUNTER — Telehealth: Payer: Self-pay

## 2017-11-21 NOTE — Telephone Encounter (Signed)
Pt is past due for EGD with ERCP scope. Do you want this pt scheduled direct at the hospital or does he need an OV? Please advise.

## 2017-11-24 NOTE — Telephone Encounter (Signed)
Direct at Hospital if no significant interval medical problems with ERCP scope. Do not overbook

## 2017-11-26 ENCOUNTER — Other Ambulatory Visit: Payer: Self-pay

## 2017-11-26 DIAGNOSIS — D132 Benign neoplasm of duodenum: Secondary | ICD-10-CM

## 2017-11-26 NOTE — Telephone Encounter (Signed)
Pt scheduled for EGD with ERCP scope at [facility] 01/06/18@9 :30am, pt to arrive there at 8am. Pt to be NPO after midnight. Pt aware of appt.

## 2018-01-05 ENCOUNTER — Ambulatory Visit: Payer: Self-pay

## 2018-01-05 NOTE — Telephone Encounter (Addendum)
ret'd call to pt. to discuss symptoms.  Reported has had joint pain for approx. one month; feels pain in joints of feet, toes, ankles, knees, hips, and fingers.  Also reported tingling and stinging in his fingers, and soreness in hands.  Described an intermittent sensation like "feeling pins in tips of fingers." Stated there is "mild" swelling of his feet, ankles, knees and also in his hands  Reported that his symptoms have gradually progressed over the course of the past month.  Reported has been using CBD cream, topically and Excedrin, for the joint pain.  Questioned about shortness of breath; reported that he "has had shortness of breath with activity for awhile."  Denied chest pain.  Voiced concern that his symptoms are related to Pravastatin.  Reported he has been on the Pravastatin for at least a year.   Appt. given with PCP on 7/3; care advice given per protocol.  Encouraged to call if symptoms worsen, prior to appt.  Verb. Understanding.            Reason for Disposition . [1] MILD swelling of both ankles (i.e., pedal edema) AND [2] new onset or worsening  Answer Assessment - Initial Assessment Questions 1. SYMPTOMS: "Do you have any symptoms?"     Tingling and stinging in fingers, hand sore ; joint pain in multiple joints; swelling in hands and feet and ankles  2. SEVERITY: If symptoms are present, ask "Are they mild, moderate or severe?"     constant x 1 mo.  Answer Assessment - Initial Assessment Questions 1. ONSET: "When did the swelling start?" (e.g., minutes, hours, days)     About one month  2. LOCATION: "What part of the leg is swollen?"  "Are both legs swollen or just one leg?"     Feet and ankles, knees and joints of fingers/  hands 3. SEVERITY: "How bad is the swelling?" (e.g., localized; mild, moderate, severe)  - Localized - small area of swelling localized to one leg  - MILD pedal edema - swelling limited to foot and ankle, pitting edema < 1/4 inch (6 mm) deep, rest and  elevation eliminate most or all swelling  - MODERATE edema - swelling of lower leg to knee, pitting edema > 1/4 inch (6 mm) deep, rest and elevation only partially reduce swelling  - SEVERE edema - swelling extends above knee, facial or hand swelling present      Unable to wear his rings; mild ; swelling in knees and ankles 4. REDNESS: "Does the swelling look red or infected?"     No redness 5. PAIN: "Is the swelling painful to touch?" If so, ask: "How painful is it?"   (Scale 1-10; mild, moderate or severe)     Pain in swollen joints is a 10/10 intermittently; steady pain is 3-4/10 6. FEVER: "Do you have a fever?" If so, ask: "What is it, how was it measured, and when did it start?"      No fever 7. CAUSE: "What do you think is causing the leg swelling?"    Knee, ankle hand swelling only 8. MEDICAL HISTORY: "Do you have a history of heart failure, kidney disease, liver failure, or cancer?"     *No Answer* 9. RECURRENT SYMPTOM: "Have you had leg swelling before?" If so, ask: "When was the last time?" "What happened that time?"     *No Answer* 10. OTHER SYMPTOMS: "Do you have any other symptoms?" (e.g., chest pain, difficulty breathing)       Some  shortness of breath with activity; "has been going on awhile"  Protocols used: LEG SWELLING AND EDEMA-A-AH, MEDICATION QUESTION CALL-A-AH

## 2018-01-06 ENCOUNTER — Encounter (HOSPITAL_COMMUNITY)
Admission: RE | Disposition: A | Discharge status: Home / Self Care | Payer: Self-pay | Source: Ambulatory Visit | Attending: Internal Medicine

## 2018-01-06 ENCOUNTER — Ambulatory Visit (HOSPITAL_COMMUNITY)
Admission: RE | Admit: 2018-01-06 | Discharge: 2018-01-06 | Disposition: A | Discharge status: Home / Self Care | Payer: Medicare Other | Source: Ambulatory Visit | Attending: Internal Medicine | Admitting: Internal Medicine

## 2018-01-06 ENCOUNTER — Ambulatory Visit (HOSPITAL_COMMUNITY): Payer: Medicare Other | Admitting: Registered Nurse

## 2018-01-06 ENCOUNTER — Other Ambulatory Visit: Payer: Self-pay

## 2018-01-06 ENCOUNTER — Encounter (HOSPITAL_COMMUNITY): Payer: Self-pay

## 2018-01-06 DIAGNOSIS — Z885 Allergy status to narcotic agent status: Secondary | ICD-10-CM | POA: Insufficient documentation

## 2018-01-06 DIAGNOSIS — Z8249 Family history of ischemic heart disease and other diseases of the circulatory system: Secondary | ICD-10-CM | POA: Diagnosis not present

## 2018-01-06 DIAGNOSIS — D132 Benign neoplasm of duodenum: Secondary | ICD-10-CM

## 2018-01-06 DIAGNOSIS — Z800 Family history of malignant neoplasm of digestive organs: Secondary | ICD-10-CM | POA: Diagnosis not present

## 2018-01-06 DIAGNOSIS — K2980 Duodenitis without bleeding: Secondary | ICD-10-CM | POA: Diagnosis not present

## 2018-01-06 DIAGNOSIS — Z87891 Personal history of nicotine dependence: Secondary | ICD-10-CM | POA: Diagnosis not present

## 2018-01-06 DIAGNOSIS — N400 Enlarged prostate without lower urinary tract symptoms: Secondary | ICD-10-CM | POA: Diagnosis not present

## 2018-01-06 DIAGNOSIS — M069 Rheumatoid arthritis, unspecified: Secondary | ICD-10-CM | POA: Insufficient documentation

## 2018-01-06 DIAGNOSIS — K219 Gastro-esophageal reflux disease without esophagitis: Secondary | ICD-10-CM | POA: Insufficient documentation

## 2018-01-06 DIAGNOSIS — Z79899 Other long term (current) drug therapy: Secondary | ICD-10-CM | POA: Insufficient documentation

## 2018-01-06 DIAGNOSIS — E785 Hyperlipidemia, unspecified: Secondary | ICD-10-CM | POA: Insufficient documentation

## 2018-01-06 DIAGNOSIS — K317 Polyp of stomach and duodenum: Secondary | ICD-10-CM

## 2018-01-06 DIAGNOSIS — K222 Esophageal obstruction: Secondary | ICD-10-CM | POA: Diagnosis not present

## 2018-01-06 DIAGNOSIS — I10 Essential (primary) hypertension: Secondary | ICD-10-CM | POA: Insufficient documentation

## 2018-01-06 HISTORY — PX: ESOPHAGOGASTRODUODENOSCOPY (EGD) WITH PROPOFOL: SHX5813

## 2018-01-06 HISTORY — PX: BIOPSY: SHX5522

## 2018-01-06 SURGERY — ESOPHAGOGASTRODUODENOSCOPY (EGD) WITH PROPOFOL
Anesthesia: Monitor Anesthesia Care

## 2018-01-06 MED ORDER — PROPOFOL 500 MG/50ML IV EMUL
INTRAVENOUS | Status: DC | PRN
  Administered 2018-01-06: 300 ug/kg/min via INTRAVENOUS

## 2018-01-06 MED ORDER — LIDOCAINE HCL (CARDIAC) PF 100 MG/5ML IV SOSY
PREFILLED_SYRINGE | INTRAVENOUS | Status: DC | PRN
  Administered 2018-01-06: 75 mg via INTRAVENOUS

## 2018-01-06 MED ORDER — LACTATED RINGERS IV SOLN
INTRAVENOUS | Status: DC
  Administered 2018-01-06: 1000 mL via INTRAVENOUS

## 2018-01-06 MED ORDER — GLYCOPYRROLATE 0.2 MG/ML IJ SOLN
INTRAMUSCULAR | Status: DC | PRN
  Administered 2018-01-06: 0.1 mg via INTRAVENOUS

## 2018-01-06 MED ORDER — PROPOFOL 10 MG/ML IV BOLUS
INTRAVENOUS | Status: AC
  Filled 2018-01-06: qty 40

## 2018-01-06 MED ORDER — SODIUM CHLORIDE 0.9 % IV SOLN: INTRAVENOUS | Status: DC

## 2018-01-06 SURGICAL SUPPLY — 15 items

## 2018-01-06 NOTE — Anesthesia Postprocedure Evaluation (Signed)
Anesthesia Post Note  Patient: Forestine Na  Procedure(s) Performed: ESOPHAGOGASTRODUODENOSCOPY (EGD) WITH PROPOFOL (N/A ) BIOPSY     Patient location during evaluation: Endoscopy Anesthesia Type: MAC Level of consciousness: awake and alert Pain management: pain level controlled Vital Signs Assessment: post-procedure vital signs reviewed and stable Respiratory status: spontaneous breathing, nonlabored ventilation, respiratory function stable and patient connected to nasal cannula oxygen Cardiovascular status: blood pressure returned to baseline and stable Postop Assessment: no apparent nausea or vomiting Anesthetic complications: no    Last Vitals:  Vitals:   01/06/18 1010 01/06/18 1020  BP: (!) 100/48 (!) 125/49  Pulse: 69 74  Resp: 12 17  Temp:    SpO2: 99% 96%    Last Pain:  Vitals:   01/06/18 1020  TempSrc:   PainSc: 0-No pain                 Bird Swetz DANIEL

## 2018-01-06 NOTE — Discharge Instructions (Signed)
YOU HAD AN ENDOSCOPIC PROCEDURE TODAY: Refer to the procedure report and other information in the discharge instructions given to you for any specific questions about what was found during the examination. If this information does not answer your questions, please call [place] office at [phone] to clarify.  ° °YOU SHOULD EXPECT: Some feelings of bloating in the abdomen. Passage of more gas than usual. Walking can help get rid of the air that was put into your GI tract during the procedure and reduce the bloating.. ° °DIET: Your first meal following the procedure should be a light meal and then it is ok to progress to your normal diet. A half-sandwich or bowl of soup is an example of a good first meal. Heavy or fried foods are harder to digest and may make you feel nauseous or bloated. Drink plenty of fluids but you should avoid alcoholic beverages for 24 hours. If you had a esophageal dilation, please see attached instructions for diet.   ° °ACTIVITY: Your care partner should take you home directly after the procedure. You should plan to take it easy, moving slowly for the rest of the day. You can resume normal activity the day after the procedure however YOU SHOULD NOT DRIVE, use power tools, machinery or perform tasks that involve climbing or major physical exertion for 24 hours (because of the sedation medicines used during the test).  ° °SYMPTOMS TO REPORT IMMEDIATELY: °A gastroenterologist can be reached at any hour. Please call [phone]  for any of the following symptoms:  ° °Following upper endoscopy (EGD, EUS, ERCP, esophageal dilation) °Vomiting of blood or coffee ground material  °New, significant abdominal pain  °New, significant chest pain or pain under the shoulder blades  °Painful or persistently difficult swallowing  °New shortness of breath  °Black, tarry-looking or red, bloody stools ° °FOLLOW UP:  °If any biopsies were taken you will be contacted by phone or by letter within the next 1-3  weeks. Call [phone]  if you have not heard about the biopsies in 3 weeks.  °Please also call with any specific questions about appointments or follow up tests. ° °

## 2018-01-06 NOTE — H&P (Signed)
HISTORY OF PRESENT ILLNESS:  Thomas Hudson is a 77 y.o. 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 Subsequent recurrence for which he underwent ampullectomy at [facility] with Dr. Lysle Rubens last year. Shortly thereafter in May 2018 the pancreatic stent was removed. He is now for follow-up. He has been clinically asymptomatic. He is accompanied today by his wife  REVIEW OF SYSTEMS:  All non-GI ROS negative except for arthritis  Past Medical History:  Diagnosis Date  . BPH (benign prostatic hyperplasia)   . Carpal tunnel syndrome on both sides   . GERD (gastroesophageal reflux disease)   . Hyperlipidemia   . Hypertension   . RAD (reactive airway disease)    no PMH of asthma; RAD post infection  . Rheumatoid arthritis [facility])         Past Surgical History:  Procedure Laterality Date  . COLONOSCOPY W/ POLYPECTOMY  2009   X 2; Dr.Perry   . ERCP N/A 11/05/2016   Procedure: ENDOSCOPIC RETROGRADE CHOLANGIOPANCREATOGRAPHY (ERCP);  Surgeon: Irene Shipper, MD;  Location: [facility] ENDOSCOPY;  Service: Endoscopy;  Laterality: N/A;  . ESOPHAGOGASTRODUODENOSCOPY (EGD) WITH PROPOFOL N/A 07/29/2016   Procedure: ESOPHAGOGASTRODUODENOSCOPY (EGD) WITH PROPOFOL;  Surgeon: Irene Shipper, MD;  Location: [facility] ENDOSCOPY;  Service: Endoscopy;  Laterality: N/A;  will need ERCP scope  . HERNIA REPAIR    . LUMBAR LAMINECTOMY  07/2008  . UPPER GASTROINTESTINAL ENDOSCOPY      gastric polyp;Dr.Perry   . VASECTOMY    . WHIPPLE PROCEDURE  2012   partial ; [facility]    Social History Thomas Hudson  reports that he quit smoking about 20 years ago. His smoking use included cigarettes. He has a 50.00 pack-year smoking history. He has never used smokeless tobacco. He reports that he does not drink alcohol or use drugs.  family history includes COPD in his sister; Colon cancer in his paternal grandfather; Coronary artery disease in his brother; Esophageal cancer in his brother and father;  Heart attack in his paternal uncle; Heart disease in his maternal grandfather and paternal grandfather; Leukemia in his sister; Lung cancer in his sister; Ovarian cancer in his sister; Prostate cancer in his paternal uncle; Skin cancer in his mother; Stroke (age of onset: 62) in his paternal grandmother; Throat cancer in his brother, father, and sister; Uterine cancer in his sister.  Allergies  Allergen Reactions  . Morphine And Related     Itching & rash Because of a history of documented adverse serious drug reaction;Medi Alert bracelet  is recommended       PHYSICAL EXAMINATION: Vital signs: BP (!) 157/75   Pulse 67   Temp 97.6 F (36.4 C) (Oral)   Resp 12   Ht 5' 7.5" (1.715 m)   Wt 180 lb (81.6 kg)   SpO2 100%   BMI 27.78 kg/m   Constitutional: generally well-appearing, no acute distress Psychiatric: alert and oriented x3, cooperative Eyes: extraocular movements intact, anicteric, conjunctiva pink Mouth: oral pharynx moist, no lesions Neck: supple no lymphadenopathy Cardiovascular: heart regular rate and rhythm, no murmur Lungs: clear to auscultation bilaterally Abdomen: soft, nontender, nondistended, no obvious ascites, no peritoneal signs, normal bowel sounds, no organomegaly Rectal:omitted Extremities: no clubbing, cyanosis, or lower extremity edema bilaterally Skin: no lesions on visible extremities Neuro: No focal deficits. Cranial nerves intact  ASSESSMENT:  1. History of recurrent ampullary adenoma with resections 2 as described. Now for surveillance endoscopy with side-viewing endoscope and biopsies if indicated  PLAN:  1. Endoscopy as above.The nature of the procedure, as well as the risks, benefits, and alternatives were carefully and thoroughly reviewed with the patient. Ample time for discussion and questions allowed. The patient understood, was satisfied, and agreed to proceed.

## 2018-01-06 NOTE — Anesthesia Procedure Notes (Signed)
Procedure Name: MAC Date/Time: 01/06/2018 9:43 AM Performed by: Lissa Morales, CRNA Pre-anesthesia Checklist: Patient identified, Emergency Drugs available, Suction available, Patient being monitored and Timeout performed Patient Re-evaluated:Patient Re-evaluated prior to induction Oxygen Delivery Method: Nasal cannula Preoxygenation: Pre-oxygenation with 100% oxygen Placement Confirmation: positive ETCO2

## 2018-01-06 NOTE — Transfer of Care (Signed)
Immediate Anesthesia Transfer of Care Note  Patient: Thomas Hudson  Procedure(s) Performed: ESOPHAGOGASTRODUODENOSCOPY (EGD) WITH PROPOFOL (N/A ) BIOPSY  Patient Location: PACU  Anesthesia Type:MAC  Level of Consciousness: sedated and patient cooperative  Airway & Oxygen Therapy: Patient Spontanous Breathing and Patient connected to nasal cannula oxygen  Post-op Assessment: Report given to RN, Post -op Vital signs reviewed and stable and Patient moving all extremities X 4  Post vital signs: stable  Last Vitals:  Vitals Value Taken Time  BP 102/50 01/06/2018 10:07 AM  Temp    Pulse 70 01/06/2018 10:08 AM  Resp 14 01/06/2018 10:08 AM  SpO2 99 % 01/06/2018 10:08 AM  Vitals shown include unvalidated device data.  Last Pain:  Vitals:   01/06/18 0842  TempSrc: Oral  PainSc: 10-Worst pain ever         Complications: No apparent anesthesia complications

## 2018-01-06 NOTE — Op Note (Signed)
[facility] Patient Name: Thomas Hudson Procedure Date: 01/06/2018 MRN: [id number] Attending MD: Docia Chuck. Henrene Pastor , MD Date of Birth: [birth year] CSN: [id number] Age: 77 Admit Type: Inpatient Procedure:                Upper GI endoscopy, with biopsies using ERCP scope Indications:              Surveillance procedure, Follow-up of benign                            duodenal tumor. Prior resection of ampullary                            adenoma at [facility] with recurrence treated                            with ampullectomy at [facility] (Dr. Lysle Rubens) last year.                            Last surveillance exam with removal of pancreatic                            duct stent May 2018 Providers:                Docia Chuck. Henrene Pastor, MD, Presley Raddle, RN, Cletis Athens, Technician, Enrigue Catena, CRNA Referring MD:              Medicines:                Monitored Anesthesia Care Complications:            No immediate complications. Estimated Blood Loss:     Estimated blood loss: none. Procedure:                Pre-Anesthesia Assessment:                           - Prior to the procedure, a History and Physical                            was performed, and patient medications and                            allergies were reviewed. The patient's tolerance of                            previous anesthesia was also reviewed. The risks                            and benefits of the procedure and the sedation                            options and risks were discussed with the patient.  All questions were answered, and informed consent                            was obtained. Prior Anticoagulants: The patient has                            taken no previous anticoagulant or antiplatelet                            agents. ASA Grade Assessment: II - A patient with                            mild systemic disease. After reviewing the risks                       and benefits, the patient was deemed in                            satisfactory condition to undergo the procedure.                           After obtaining informed consent, the endoscope was                            passed under direct vision. Throughout the                            procedure, the patient's blood pressure, pulse, and                            oxygen saturations were monitored continuously. The                            [id number] scope was introduced through the                            mouth, and advanced to the second part of duodenum.                            The upper GI endoscopy was accomplished without                            difficulty. The patient tolerated the procedure                            well. Scope In: Scope Out: Findings:      The esophagus was not examined (side-viewing scope).      The stomach was normal.      The examined duodenum was normal. The area of previous ampullectomy       appeared unremarkable. Possibly diminutive residual adenoma around the       ampullary orifice. Biopsies taken from this region as well as ampulla       proper. Biopsies were taken with a cold forceps for histology.      The cardia and  gastric fundus were normal on retroflexion. Impression:               1. Ampullary region as described. Biopsied. Moderate Sedation:      none Recommendation:           - Patient has a contact number available for                            emergencies. The signs and symptoms of potential                            delayed complications were discussed with the                            patient. Return to normal activities tomorrow.                            Written discharge instructions were provided to the                            patient.                           - Resume previous diet.                           - Continue present medications.                           - Await  pathology results.                           - Anticipate repeat upper endoscopy with ERCP scope                            in one year Procedure Code(s):        --- Professional ---                           [phone], Esophagogastroduodenoscopy, flexible,                            transoral; with biopsy, single or multiple Diagnosis Code(s):        --- Professional ---                           D13.2, Benign neoplasm of duodenum CPT copyright 2017 American [employer] Association. All rights reserved. The codes documented in this report are preliminary and upon coder review may  be revised to meet current compliance requirements. Docia Chuck. Henrene Pastor, MD 01/06/2018 10:10:09 AM This report has been signed electronically. Number of Addenda: 0

## 2018-01-06 NOTE — Anesthesia Preprocedure Evaluation (Addendum)
Anesthesia Evaluation  Patient identified by MRN, date of birth, ID band Patient awake    Reviewed: Allergy & Precautions, NPO status , Patient's Chart, lab work & pertinent test results  History of Anesthesia Complications Negative for: history of anesthetic complications  Airway Mallampati: II  TM Distance: >3 FB Neck ROM: Full    Dental no notable dental hx. (+) Dental Advisory Given   Pulmonary former smoker,    Pulmonary exam normal        Cardiovascular hypertension, Pt. on medications Normal cardiovascular exam     Neuro/Psych negative neurological ROS  negative psych ROS   GI/Hepatic Neg liver ROS, GERD  ,  Endo/Other  negative endocrine ROS  Renal/GU negative Renal ROS     Musculoskeletal   Abdominal   Peds  Hematology  (+) anemia ,   Anesthesia Other Findings   Reproductive/Obstetrics                             Anesthesia Physical Anesthesia Plan  ASA: III  Anesthesia Plan: MAC   Post-op Pain Management:    Induction:   PONV Risk Score and Plan: 3 and 2 and Ondansetron and Dexamethasone  Airway Management Planned: Natural Airway and Nasal Cannula  Additional Equipment:   Intra-op Plan:   Post-operative Plan:   Informed Consent: I have reviewed the patients History and Physical, chart, labs and discussed the procedure including the risks, benefits and alternatives for the proposed anesthesia with the patient or authorized representative who has indicated his/her understanding and acceptance.   Dental advisory given  Plan Discussed with: CRNA and Anesthesiologist  Anesthesia Plan Comments:        Anesthesia Quick Evaluation

## 2018-01-07 ENCOUNTER — Encounter: Payer: Self-pay | Admitting: Internal Medicine

## 2018-01-07 ENCOUNTER — Ambulatory Visit (INDEPENDENT_AMBULATORY_CARE_PROVIDER_SITE_OTHER): Payer: Medicare Other | Admitting: Internal Medicine

## 2018-01-07 VITALS — BP 140/76 | HR 74 | Temp 98.0°F | Ht 65.5 in | Wt 182.0 lb

## 2018-01-07 DIAGNOSIS — M79643 Pain in unspecified hand: Secondary | ICD-10-CM | POA: Insufficient documentation

## 2018-01-07 DIAGNOSIS — M79641 Pain in right hand: Secondary | ICD-10-CM | POA: Diagnosis not present

## 2018-01-07 NOTE — Progress Notes (Signed)
Subjective:    Patient ID: Thomas Hudson, male    DOB: April 05, 1941, 77 y.o.   MRN: [id number]  HPI Here due to pain Having trouble closing hands Elbows hurt Pain along feet and the back of his head More on right side Some pain in back as well and shoulders  Started about 1 month ago but worsening Thought he had "overdone it" at first Worsened--hand swelling worsened--even had to take off rings Pain seems more in between joints then on the joints Not like his past RA---- did have MTX in the past Sees Dr Dossie Der  Current Outpatient Medications on File Prior to Visit  Medication Sig Dispense Refill  . alfuzosin (UROXATRAL) 10 MG 24 hr tablet Take 1 tablet (10 mg total) by mouth daily. TAKE 1 TABLET BY MOUTH DAILY (Patient taking differently: Take 10 mg by mouth daily. ) 90 tablet 3  . amLODipine (NORVASC) 5 MG tablet Take 1 tablet (5 mg total) by mouth daily. 90 tablet 3  . aspirin-acetaminophen-caffeine (EXCEDRIN MIGRAINE) 250-250-65 MG tablet Take 2 tablets by mouth 3 (three) times daily as needed for headache.    . cetirizine (ZYRTEC) 10 MG tablet Take 1 tablet (10 mg total) by mouth daily. (Patient taking differently: Take 10 mg by mouth at bedtime. ) 90 tablet 3  . Cholecalciferol (VITAMIN D) 2000 units tablet Take 2,000 Units by mouth daily.    Marland Kitchen erythromycin ophthalmic ointment Place 1 application into both eyes daily as needed (dry eyes).    . ferrous sulfate 325 (65 FE) MG tablet Take 1 tablet (325 mg total) by mouth 3 (three) times daily with meals. 270 tablet 3  . finasteride (PROSCAR) 5 MG tablet Take 1 tablet (5 mg total) by mouth daily. 90 tablet 3  . fluticasone (FLONASE) 50 MCG/ACT nasal spray Place 2 sprays into both nostrils daily as needed for allergies. 48 g 3  . Ginger, Zingiber officinalis, (GINGER ROOT PO) Take 1 tablet by mouth daily.    Marland Kitchen losartan (COZAAR) 100 MG tablet Take 1 tablet (100 mg total) by mouth daily. 90 tablet 3  . Misc Natural Products (TURMERIC  CURCUMIN) CAPS Take 1 capsule by mouth daily.    . montelukast (SINGULAIR) 10 MG tablet Take 1 tablet (10 mg total) by mouth at bedtime. 90 tablet 3  . omeprazole (PRILOSEC) 20 MG capsule Take 1 capsule (20 mg total) by mouth daily. 90 capsule 3  . potassium chloride SA (K-DUR,KLOR-CON) 20 MEQ tablet Take 1 tablet (20 mEq total) by mouth daily. 90 tablet 3  . pravastatin (PRAVACHOL) 40 MG tablet Take 1 tablet (40 mg total) by mouth daily. 90 tablet 3   No current facility-administered medications on file prior to visit.     Allergies  Allergen Reactions  . Morphine And Related     Itching & rash Because of a history of documented adverse serious drug reaction;Medi Alert bracelet  is recommended    Past Medical History:  Diagnosis Date  . BPH (benign prostatic hyperplasia)   . Carpal tunnel syndrome on both sides   . GERD (gastroesophageal reflux disease)   . Hyperlipidemia   . Hypertension   . RAD (reactive airway disease)    no PMH of asthma; RAD post infection  . Rheumatoid arthritis [facility])         Past Surgical History:  Procedure Laterality Date  . COLONOSCOPY W/ POLYPECTOMY  2009   X 2; Dr.Perry   . ERCP N/A 11/05/2016   Procedure:  ENDOSCOPIC RETROGRADE CHOLANGIOPANCREATOGRAPHY (ERCP);  Surgeon: Irene Shipper, MD;  Location: [facility] ENDOSCOPY;  Service: Endoscopy;  Laterality: N/A;  . ESOPHAGOGASTRODUODENOSCOPY (EGD) WITH PROPOFOL N/A 07/29/2016   Procedure: ESOPHAGOGASTRODUODENOSCOPY (EGD) WITH PROPOFOL;  Surgeon: Irene Shipper, MD;  Location: [facility] ENDOSCOPY;  Service: Endoscopy;  Laterality: N/A;  will need ERCP scope  . HERNIA REPAIR    . LUMBAR LAMINECTOMY  07/2008  . UPPER GASTROINTESTINAL ENDOSCOPY      gastric polyp;Dr.Perry   . VASECTOMY    . WHIPPLE PROCEDURE  2012   partial ; [facility]    Family History  Problem Relation Age of Onset  . Throat cancer Father        smoker  . Esophageal cancer Father   . Prostate cancer Paternal Uncle   . Heart attack Paternal Uncle          3 uncles, >55  . Coronary artery disease Brother        in 39s  . Esophageal cancer Brother   . Skin cancer Mother   . Throat cancer Brother   . Uterine cancer Sister   . COPD Sister        X91  . Ovarian cancer Sister   . Stroke Paternal Grandmother 9  . Colon cancer Paternal Grandfather   . Heart disease Paternal Grandfather   . Heart disease Maternal Grandfather   . Throat cancer Sister        smoker  . Lung cancer Sister        smoker  . Leukemia Sister   . Asthma Neg Hx     Social History   Socioeconomic History  . Marital status: Married    Spouse name: Not on file  . Number of children: 2  . Years of education: Not on file  . Highest education level: Not on file  Occupational History  . 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 Financial resource strain: Not on file  . Food insecurity:    Worry: Not on file    Inability: Not on file  . Transportation needs:    Medical: Not on file    Non-medical: Not on file  Tobacco Use  . Smoking status: Former Smoker    Packs/day: 1.00    Years: 50.00    Pack years: 50.00    Types: Cigarettes    Last attempt to quit: 07/08/1997    Years since quitting: 20.5  . Smokeless tobacco: Never Used  . Tobacco comment: smoked age [age], up to 1 ppd  Substance and Sexual Activity  . Alcohol use: No    Alcohol/week: 0.0 oz  . Drug use: No  . Sexual activity: Yes  Lifestyle  . Physical activity:    Days per week: Not on file    Minutes per session: Not on file  . Stress: Not on file  Relationships  . Social connections:    Talks on phone: Not on file    Gets together: Not on file    Attends religious service: Not on file    Active member of club or organization: Not on file    Attends meetings of clubs or organizations: Not on file    Relationship status: Not on file  . Intimate partner violence:    Fear of current or ex partner: Not on file    Emotionally abused: Not on  file    Physically abused: Not on file  Forced sexual activity: Not on file  Other Topics Concern  . Not on file  Social History Narrative   No living will   Requests wife as health care POA--- then daughters   Would accept resuscitation attempts   Not sure about tube feeds   Review of Systems No fever--but hands seem hot Appetite is okay No weight loss No N/V No bowel problems No dysphagia Some recent constipation--uses stool softener    Objective:   Physical Exam  Constitutional: He appears well-developed. No distress.  Musculoskeletal:  Wrists and MCPs are quiet Thickening in PIPs of hands---but not tender. Does have tenderness in between joints Tenderness at right 1st MTP but no other sig foot findings No other clear synovitis           Assessment & Plan:

## 2018-01-07 NOTE — Patient Instructions (Signed)
Please set up an appointment with Dr Dossie Der as soon as possible.

## 2018-01-07 NOTE — Assessment & Plan Note (Signed)
And other non joint pain (tenosynovitis?) The hand pain is concerning for dactylitis  Also MTP--- ???gout (no history) This doesn't seem to be consistent with recurrence of RA  Will have him set up with Dr Dossie Der ASAP I think she can diagnose and decide on testing more appropriately

## 2018-01-09 ENCOUNTER — Encounter (HOSPITAL_COMMUNITY): Payer: Self-pay | Admitting: Internal Medicine

## 2018-01-12 ENCOUNTER — Encounter: Payer: Self-pay | Admitting: Internal Medicine

## 2018-01-12 DIAGNOSIS — Z79899 Other long term (current) drug therapy: Secondary | ICD-10-CM | POA: Diagnosis not present

## 2018-01-12 DIAGNOSIS — M1990 Unspecified osteoarthritis, unspecified site: Secondary | ICD-10-CM | POA: Diagnosis not present

## 2018-01-12 DIAGNOSIS — M7581 Other shoulder lesions, right shoulder: Secondary | ICD-10-CM | POA: Diagnosis not present

## 2018-02-03 DIAGNOSIS — R3912 Poor urinary stream: Secondary | ICD-10-CM | POA: Diagnosis not present

## 2018-02-03 DIAGNOSIS — N401 Enlarged prostate with lower urinary tract symptoms: Secondary | ICD-10-CM | POA: Diagnosis not present

## 2018-02-04 DIAGNOSIS — M1990 Unspecified osteoarthritis, unspecified site: Secondary | ICD-10-CM | POA: Diagnosis not present

## 2018-02-04 DIAGNOSIS — Z79899 Other long term (current) drug therapy: Secondary | ICD-10-CM | POA: Diagnosis not present

## 2018-02-28 DIAGNOSIS — L509 Urticaria, unspecified: Secondary | ICD-10-CM | POA: Diagnosis not present

## 2018-04-14 DIAGNOSIS — M1990 Unspecified osteoarthritis, unspecified site: Secondary | ICD-10-CM | POA: Diagnosis not present

## 2018-04-14 DIAGNOSIS — M150 Primary generalized (osteo)arthritis: Secondary | ICD-10-CM | POA: Diagnosis not present

## 2018-04-14 DIAGNOSIS — Z79899 Other long term (current) drug therapy: Secondary | ICD-10-CM | POA: Diagnosis not present

## 2018-04-14 DIAGNOSIS — Z23 Encounter for immunization: Secondary | ICD-10-CM | POA: Diagnosis not present

## 2018-05-06 ENCOUNTER — Encounter: Payer: Self-pay | Admitting: Internal Medicine

## 2018-05-06 ENCOUNTER — Ambulatory Visit (INDEPENDENT_AMBULATORY_CARE_PROVIDER_SITE_OTHER): Payer: Medicare Other | Admitting: Internal Medicine

## 2018-05-06 ENCOUNTER — Ambulatory Visit (INDEPENDENT_AMBULATORY_CARE_PROVIDER_SITE_OTHER)
Admission: RE | Admit: 2018-05-06 | Discharge: 2018-05-06 | Disposition: A | Discharge status: Home / Self Care | Payer: Medicare Other | Source: Ambulatory Visit | Attending: Internal Medicine | Admitting: Internal Medicine

## 2018-05-06 VITALS — BP 140/78 | HR 86 | Temp 98.0°F | Ht 66.5 in | Wt 188.0 lb

## 2018-05-06 DIAGNOSIS — Z0000 Encounter for general adult medical examination without abnormal findings: Secondary | ICD-10-CM | POA: Diagnosis not present

## 2018-05-06 DIAGNOSIS — R0602 Shortness of breath: Secondary | ICD-10-CM | POA: Diagnosis not present

## 2018-05-06 DIAGNOSIS — M06049 Rheumatoid arthritis without rheumatoid factor, unspecified hand: Secondary | ICD-10-CM

## 2018-05-06 DIAGNOSIS — R05 Cough: Secondary | ICD-10-CM

## 2018-05-06 DIAGNOSIS — J410 Simple chronic bronchitis: Secondary | ICD-10-CM | POA: Diagnosis not present

## 2018-05-06 DIAGNOSIS — N401 Enlarged prostate with lower urinary tract symptoms: Secondary | ICD-10-CM | POA: Diagnosis not present

## 2018-05-06 DIAGNOSIS — R053 Chronic cough: Secondary | ICD-10-CM

## 2018-05-06 DIAGNOSIS — K219 Gastro-esophageal reflux disease without esophagitis: Secondary | ICD-10-CM

## 2018-05-06 DIAGNOSIS — I10 Essential (primary) hypertension: Secondary | ICD-10-CM | POA: Diagnosis not present

## 2018-05-06 MED ORDER — OMEPRAZOLE 20 MG PO CPDR: 20.0000 mg | DELAYED_RELEASE_CAPSULE | Freq: Every day | ORAL | 3 refills | Status: DC

## 2018-05-06 MED ORDER — PRAVASTATIN SODIUM 40 MG PO TABS: 40.0000 mg | ORAL_TABLET | Freq: Every day | ORAL | 3 refills | Status: DC

## 2018-05-06 MED ORDER — ALBUTEROL SULFATE HFA 108 (90 BASE) MCG/ACT IN AERS: 2.0000 | INHALATION_SPRAY | Freq: Four times a day (QID) | RESPIRATORY_TRACT | 2 refills | Status: DC | PRN

## 2018-05-06 MED ORDER — MONTELUKAST SODIUM 10 MG PO TABS
10.0000 mg | ORAL_TABLET | Freq: Every day | ORAL | 3 refills | Status: DC
Start: 2018-05-06 — End: 2019-05-24

## 2018-05-06 MED ORDER — FLUTICASONE PROPIONATE 50 MCG/ACT NA SUSP: 2.0000 | Freq: Every day | NASAL | 3 refills | Status: DC | PRN

## 2018-05-06 MED ORDER — FINASTERIDE 5 MG PO TABS: 5.0000 mg | ORAL_TABLET | Freq: Every day | ORAL | 3 refills | Status: DC

## 2018-05-06 MED ORDER — LOSARTAN POTASSIUM 100 MG PO TABS: 100.0000 mg | ORAL_TABLET | Freq: Every day | ORAL | 3 refills | Status: DC

## 2018-05-06 MED ORDER — POTASSIUM CHLORIDE CRYS ER 20 MEQ PO TBCR: 20.0000 meq | EXTENDED_RELEASE_TABLET | Freq: Every day | ORAL | 3 refills | Status: DC

## 2018-05-06 MED ORDER — AMLODIPINE BESYLATE 5 MG PO TABS: 5.0000 mg | ORAL_TABLET | Freq: Every day | ORAL | 3 refills | Status: DC

## 2018-05-06 NOTE — Assessment & Plan Note (Addendum)
I have personally reviewed the Medicare Annual Wellness questionnaire and have noted 1. The patient's medical and social history 2. Their use of alcohol, tobacco or illicit drugs 3. Their current medications and supplements 4. The patient's functional ability including ADL's, fall risks, home safety risks and hearing or visual             impairment. 5. Diet and physical activities 6. Evidence for depression or mood disorders  The patients weight, height, BMI and visual acuity have been recorded in the chart I have made referrals, counseling and provided education to the patient based review of the above and I have provided the pt with a written personalized care plan for preventive services.  I have provided you with a copy of your personalized plan for preventive services. Please take the time to review along with your updated medication list.  Done with colonoscopies Discussed holding off an any more PSA testing Will try to increase exercise if arthritis controlled Consider shingrix Had flu vaccine

## 2018-05-06 NOTE — Progress Notes (Signed)
Subjective:    Patient ID: Thomas Hudson, male    DOB: 01/26/1941, 77 y.o.   MRN: [id number]  HPI Here for Medicare wellness visit and follow up of chronic health conditions Reviewed form and advanced directives Reviewed other doctors No alcohol or tobacco Walks regularly--but only 1 mile at a time No falls No depression or anhedonia Vision is fine Mild hearing loss--not ready for aides Independent with instrumental ADLs No memory problems  Did get new Rx from Dr Dossie Der due to active arthritis Initially resolved with high dose steroid shot Now on lefunomide--but considering change to cimzia (not working on hip, foot, knee) Still on prednisone as well  Had repeat EGD---no problems  Awakening with cramps at night Feet tend to be cold lately Discussed hydration  Voids okay Nocturia up to 2 per night No sig daytime issues on the dual therapy Discussed stopping PSA testing  No chest pian Gets SOB easy---but no change in the past year Some cough and phlegm No wheezing now---but does if he gets "wet" No dizziness or syncope Some edema--right ankle from the arthritis  Current Outpatient Medications on File Prior to Visit  Medication Sig Dispense Refill  . alfuzosin (UROXATRAL) 10 MG 24 hr tablet Take 1 tablet (10 mg total) by mouth daily. TAKE 1 TABLET BY MOUTH DAILY (Patient taking differently: Take 10 mg by mouth daily. ) 90 tablet 3  . amLODipine (NORVASC) 5 MG tablet Take 1 tablet (5 mg total) by mouth daily. 90 tablet 3  . aspirin-acetaminophen-caffeine (EXCEDRIN MIGRAINE) 250-250-65 MG tablet Take 2 tablets by mouth 3 (three) times daily as needed for headache.    . cetirizine (ZYRTEC) 10 MG tablet Take 1 tablet (10 mg total) by mouth daily. (Patient taking differently: Take 10 mg by mouth at bedtime. ) 90 tablet 3  . Cholecalciferol (VITAMIN D) 2000 units tablet Take 2,000 Units by mouth daily.    Marland Kitchen erythromycin ophthalmic ointment Place 1 application into both eyes  daily as needed (dry eyes).    . ferrous sulfate 325 (65 FE) MG tablet Take 1 tablet (325 mg total) by mouth 3 (three) times daily with meals. 270 tablet 3  . finasteride (PROSCAR) 5 MG tablet Take 1 tablet (5 mg total) by mouth daily. 90 tablet 3  . fluticasone (FLONASE) 50 MCG/ACT nasal spray Place 2 sprays into both nostrils daily as needed for allergies. 48 g 3  . folic acid (FOLVITE) 1 MG tablet Take 1 tablet by mouth daily.  3  . leflunomide (ARAVA) 20 MG tablet Take 1 tablet by mouth daily.    Marland Kitchen losartan (COZAAR) 100 MG tablet Take 1 tablet (100 mg total) by mouth daily. 90 tablet 3  . Misc Natural Products (TURMERIC CURCUMIN) CAPS Take 1 capsule by mouth daily.    . montelukast (SINGULAIR) 10 MG tablet Take 1 tablet (10 mg total) by mouth at bedtime. 90 tablet 3  . omeprazole (PRILOSEC) 20 MG capsule Take 1 capsule (20 mg total) by mouth daily. 90 capsule 3  . potassium chloride SA (K-DUR,KLOR-CON) 20 MEQ tablet Take 1 tablet (20 mEq total) by mouth daily. 90 tablet 3  . pravastatin (PRAVACHOL) 40 MG tablet Take 1 tablet (40 mg total) by mouth daily. 90 tablet 3  . predniSONE (DELTASONE) 10 MG tablet Take 1 tablet by mouth daily.     No current facility-administered medications on file prior to visit.     Allergies  Allergen Reactions  . Morphine And Related  Itching & rash Because of a history of documented adverse serious drug reaction;Medi Alert bracelet  is recommended    Past Medical History:  Diagnosis Date  . BPH (benign prostatic hyperplasia)   . Carpal tunnel syndrome on both sides   . GERD (gastroesophageal reflux disease)   . Hyperlipidemia   . Hypertension   . RAD (reactive airway disease)    no PMH of asthma; RAD post infection  . Rheumatoid arthritis [facility])         Past Surgical History:  Procedure Laterality Date  . BIOPSY  01/06/2018   Procedure: BIOPSY;  Surgeon: Irene Shipper, MD;  Location: [facility] ENDOSCOPY;  Service: Endoscopy;;  . COLONOSCOPY W/  POLYPECTOMY  2009   X 2; Dr.Perry   . ERCP N/A 11/05/2016   Procedure: ENDOSCOPIC RETROGRADE CHOLANGIOPANCREATOGRAPHY (ERCP);  Surgeon: Irene Shipper, MD;  Location: [facility] ENDOSCOPY;  Service: Endoscopy;  Laterality: N/A;  . ESOPHAGOGASTRODUODENOSCOPY (EGD) WITH PROPOFOL N/A 07/29/2016   Procedure: ESOPHAGOGASTRODUODENOSCOPY (EGD) WITH PROPOFOL;  Surgeon: Irene Shipper, MD;  Location: [facility] ENDOSCOPY;  Service: Endoscopy;  Laterality: N/A;  will need ERCP scope  . ESOPHAGOGASTRODUODENOSCOPY (EGD) WITH PROPOFOL N/A 01/06/2018   Procedure: ESOPHAGOGASTRODUODENOSCOPY (EGD) WITH PROPOFOL;  Surgeon: Irene Shipper, MD;  Location: [facility] ENDOSCOPY;  Service: Endoscopy;  Laterality: N/A;  NEED ERCP SCOPE  . HERNIA REPAIR    . LUMBAR LAMINECTOMY  07/2008  . UPPER GASTROINTESTINAL ENDOSCOPY      gastric polyp;Dr.Perry   . VASECTOMY    . WHIPPLE PROCEDURE  2012   partial ; [facility]    Family History  Problem Relation Age of Onset  . Throat cancer Father        smoker  . Esophageal cancer Father   . Prostate cancer Paternal Uncle   . Heart attack Paternal Uncle        3 uncles, >55  . Coronary artery disease Brother        in 49s  . Esophageal cancer Brother   . Skin cancer Mother   . Throat cancer Brother   . Uterine cancer Sister   . COPD Sister        X45  . Ovarian cancer Sister   . Stroke Paternal Grandmother 90  . Colon cancer Paternal Grandfather   . Heart disease Paternal Grandfather   . Heart disease Maternal Grandfather   . Throat cancer Sister        smoker  . Lung cancer Sister        smoker  . Leukemia Sister   . Asthma Neg Hx     Social History   Socioeconomic History  . Marital status: Married    Spouse name: Not on file  . Number of children: 2  . Years of education: Not on file  . Highest education level: Not on file  Occupational History  . 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 Financial resource strain: Not on file  . Food  insecurity:    Worry: Not on file    Inability: Not on file  . Transportation needs:    Medical: Not on file    Non-medical: Not on file  Tobacco Use  . Smoking status: Former Smoker    Packs/day: 1.00    Years: 50.00    Pack years: 50.00    Types: Cigarettes    Last attempt to quit: 07/08/1997    Years since quitting: 20.8  .  Smokeless tobacco: Never Used  . Tobacco comment: smoked age [age], up to 1 ppd  Substance and Sexual Activity  . Alcohol use: No    Alcohol/week: 0.0 standard drinks  . Drug use: No  . Sexual activity: Yes  Lifestyle  . Physical activity:    Days per week: Not on file    Minutes per session: Not on file  . Stress: Not on file  Relationships  . Social connections:    Talks on phone: Not on file    Gets together: Not on file    Attends religious service: Not on file    Active member of club or organization: Not on file    Attends meetings of clubs or organizations: Not on file    Relationship status: Not on file  . Intimate partner violence:    Fear of current or ex partner: Not on file    Emotionally abused: Not on file    Physically abused: Not on file    Forced sexual activity: Not on file  Other Topics Concern  . Not on file  Social History Narrative   No living will   Requests wife as health care POA--- then daughters   Would accept resuscitation attempts   Not sure about tube feeds   Review of Systems Appetite is good Weight stable Sleep is variable--- wife now with dementia and on CPAP (affects his sleep) Wears seat belt Teeth are okay---getting implant next month Sees dermatologist--- not sure of name. Acne on head---using topical Rx. 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 No dysphagia     Objective:   Physical Exam  Constitutional: He is oriented to person, place, and time. He appears well-developed. No distress.  HENT:  Mouth/Throat: Oropharynx is clear and moist. No oropharyngeal exudate.  Neck: No  thyromegaly present.  Cardiovascular: Normal rate, regular rhythm, normal heart sounds and intact distal pulses. Exam reveals no gallop.  No murmur heard. Respiratory: Effort normal. No respiratory distress. He has no wheezes. He has no rales.  Decreased breath sounds but clear  GI: Soft. There is no tenderness.  Musculoskeletal: He exhibits no edema or tenderness.  Lymphadenopathy:    He has no cervical adenopathy.  Neurological: He is alert and oriented to person, place, and time.  President--- "Trump, Obama, Clinton---Bush" 100-93- "I can't do math without pencil and paper" [id number] Recall 1/3  Skin: No rash noted. No erythema.  Psychiatric: He has a normal mood and affect. His behavior is normal.           Assessment & Plan:

## 2018-05-06 NOTE — Assessment & Plan Note (Addendum)
With chronic DOE Will check EKG/CXR/Spirometry EKG---sinus at 71. Normal axis and intervals. No ischemic changes. No change since 04/02/13 Spirometry is normal EKG seems to still some bronchitic changes but no acute issues--will await overread

## 2018-05-06 NOTE — Assessment & Plan Note (Signed)
Fits criteria and likely cause of DOE and occasional wheezing Will just use albuterol for prn

## 2018-05-06 NOTE — Assessment & Plan Note (Signed)
And other places May be going on biologic soon Magn[facility] Sinning) Will try to get labs from Dr Dossie Der

## 2018-05-06 NOTE — Assessment & Plan Note (Signed)
BP Readings from Last 3 Encounters:  05/06/18 140/78  01/07/18 140/76  01/06/18 (!) 125/49   Good control

## 2018-05-06 NOTE — Assessment & Plan Note (Signed)
Okay on dual therapy 

## 2018-05-26 DIAGNOSIS — M0600 Rheumatoid arthritis without rheumatoid factor, unspecified site: Secondary | ICD-10-CM | POA: Diagnosis not present

## 2018-05-29 DIAGNOSIS — M0600 Rheumatoid arthritis without rheumatoid factor, unspecified site: Secondary | ICD-10-CM | POA: Diagnosis not present

## 2018-05-29 DIAGNOSIS — M1990 Unspecified osteoarthritis, unspecified site: Secondary | ICD-10-CM | POA: Diagnosis not present

## 2018-05-29 DIAGNOSIS — M150 Primary generalized (osteo)arthritis: Secondary | ICD-10-CM | POA: Diagnosis not present

## 2018-05-29 DIAGNOSIS — J399 Disease of upper respiratory tract, unspecified: Secondary | ICD-10-CM | POA: Diagnosis not present

## 2018-05-29 DIAGNOSIS — Z79899 Other long term (current) drug therapy: Secondary | ICD-10-CM | POA: Diagnosis not present

## 2018-06-15 DIAGNOSIS — M0600 Rheumatoid arthritis without rheumatoid factor, unspecified site: Secondary | ICD-10-CM | POA: Diagnosis not present

## 2018-06-29 DIAGNOSIS — M0600 Rheumatoid arthritis without rheumatoid factor, unspecified site: Secondary | ICD-10-CM | POA: Diagnosis not present

## 2018-07-02 ENCOUNTER — Telehealth: Payer: Self-pay | Admitting: Internal Medicine

## 2018-07-02 DIAGNOSIS — J302 Other seasonal allergic rhinitis: Secondary | ICD-10-CM

## 2018-07-02 MED ORDER — FERROUS SULFATE 325 (65 FE) MG PO TABS: 325.0000 mg | ORAL_TABLET | Freq: Three times a day (TID) | ORAL | 3 refills | Status: DC

## 2018-07-02 MED ORDER — CETIRIZINE HCL 10 MG PO TABS: 10.0000 mg | ORAL_TABLET | Freq: Every day | ORAL | 3 refills | Status: DC

## 2018-07-02 NOTE — Telephone Encounter (Signed)
I built the rxs but I am at triage so I cannot print them right now.

## 2018-07-02 NOTE — Telephone Encounter (Signed)
Rxs up front. Pt's wife aware

## 2018-07-02 NOTE — Telephone Encounter (Signed)
Patient needs written prescriptions for Ferr.Sulf. 325 mg,Cetirizine 10mg  to take to [facility].  Patient will be going there on Saturday and would like the prescriptions before then.

## 2018-07-27 DIAGNOSIS — D2262 Melanocytic nevi of left upper limb, including shoulder: Secondary | ICD-10-CM | POA: Diagnosis not present

## 2018-07-27 DIAGNOSIS — Z85828 Personal history of other malignant neoplasm of skin: Secondary | ICD-10-CM | POA: Diagnosis not present

## 2018-07-27 DIAGNOSIS — L570 Actinic keratosis: Secondary | ICD-10-CM | POA: Diagnosis not present

## 2018-07-27 DIAGNOSIS — D225 Melanocytic nevi of trunk: Secondary | ICD-10-CM | POA: Diagnosis not present

## 2018-07-27 DIAGNOSIS — R21 Rash and other nonspecific skin eruption: Secondary | ICD-10-CM | POA: Diagnosis not present

## 2018-07-28 DIAGNOSIS — M0600 Rheumatoid arthritis without rheumatoid factor, unspecified site: Secondary | ICD-10-CM | POA: Diagnosis not present

## 2018-08-25 DIAGNOSIS — Z79899 Other long term (current) drug therapy: Secondary | ICD-10-CM | POA: Diagnosis not present

## 2018-08-25 DIAGNOSIS — J399 Disease of upper respiratory tract, unspecified: Secondary | ICD-10-CM | POA: Diagnosis not present

## 2018-08-25 DIAGNOSIS — M150 Primary generalized (osteo)arthritis: Secondary | ICD-10-CM | POA: Diagnosis not present

## 2018-08-25 DIAGNOSIS — M1990 Unspecified osteoarthritis, unspecified site: Secondary | ICD-10-CM | POA: Diagnosis not present

## 2018-08-25 DIAGNOSIS — M0600 Rheumatoid arthritis without rheumatoid factor, unspecified site: Secondary | ICD-10-CM | POA: Diagnosis not present

## 2018-09-22 DIAGNOSIS — M0600 Rheumatoid arthritis without rheumatoid factor, unspecified site: Secondary | ICD-10-CM | POA: Diagnosis not present

## 2018-10-20 DIAGNOSIS — M0600 Rheumatoid arthritis without rheumatoid factor, unspecified site: Secondary | ICD-10-CM | POA: Diagnosis not present

## 2018-11-18 DIAGNOSIS — M0600 Rheumatoid arthritis without rheumatoid factor, unspecified site: Secondary | ICD-10-CM | POA: Diagnosis not present

## 2018-11-24 DIAGNOSIS — M1990 Unspecified osteoarthritis, unspecified site: Secondary | ICD-10-CM | POA: Diagnosis not present

## 2018-11-24 DIAGNOSIS — M0600 Rheumatoid arthritis without rheumatoid factor, unspecified site: Secondary | ICD-10-CM | POA: Diagnosis not present

## 2018-11-24 DIAGNOSIS — Z79899 Other long term (current) drug therapy: Secondary | ICD-10-CM | POA: Diagnosis not present

## 2018-11-24 DIAGNOSIS — M150 Primary generalized (osteo)arthritis: Secondary | ICD-10-CM | POA: Diagnosis not present

## 2018-11-24 DIAGNOSIS — J399 Disease of upper respiratory tract, unspecified: Secondary | ICD-10-CM | POA: Diagnosis not present

## 2018-12-16 DIAGNOSIS — M0600 Rheumatoid arthritis without rheumatoid factor, unspecified site: Secondary | ICD-10-CM | POA: Diagnosis not present

## 2018-12-23 DIAGNOSIS — H43812 Vitreous degeneration, left eye: Secondary | ICD-10-CM | POA: Diagnosis not present

## 2018-12-23 DIAGNOSIS — H2513 Age-related nuclear cataract, bilateral: Secondary | ICD-10-CM | POA: Diagnosis not present

## 2019-01-13 DIAGNOSIS — M0600 Rheumatoid arthritis without rheumatoid factor, unspecified site: Secondary | ICD-10-CM | POA: Diagnosis not present

## 2019-02-02 DIAGNOSIS — R3912 Poor urinary stream: Secondary | ICD-10-CM | POA: Diagnosis not present

## 2019-02-02 DIAGNOSIS — N401 Enlarged prostate with lower urinary tract symptoms: Secondary | ICD-10-CM | POA: Diagnosis not present

## 2019-02-02 DIAGNOSIS — N403 Nodular prostate with lower urinary tract symptoms: Secondary | ICD-10-CM | POA: Diagnosis not present

## 2019-02-10 DIAGNOSIS — M0600 Rheumatoid arthritis without rheumatoid factor, unspecified site: Secondary | ICD-10-CM | POA: Diagnosis not present

## 2019-02-24 DIAGNOSIS — M5136 Other intervertebral disc degeneration, lumbar region: Secondary | ICD-10-CM | POA: Diagnosis not present

## 2019-02-24 DIAGNOSIS — J399 Disease of upper respiratory tract, unspecified: Secondary | ICD-10-CM | POA: Diagnosis not present

## 2019-02-24 DIAGNOSIS — M0600 Rheumatoid arthritis without rheumatoid factor, unspecified site: Secondary | ICD-10-CM | POA: Diagnosis not present

## 2019-02-24 DIAGNOSIS — M1990 Unspecified osteoarthritis, unspecified site: Secondary | ICD-10-CM | POA: Diagnosis not present

## 2019-02-24 DIAGNOSIS — Z79899 Other long term (current) drug therapy: Secondary | ICD-10-CM | POA: Diagnosis not present

## 2019-02-24 DIAGNOSIS — R197 Diarrhea, unspecified: Secondary | ICD-10-CM | POA: Diagnosis not present

## 2019-02-24 DIAGNOSIS — M150 Primary generalized (osteo)arthritis: Secondary | ICD-10-CM | POA: Diagnosis not present

## 2019-03-10 DIAGNOSIS — M0600 Rheumatoid arthritis without rheumatoid factor, unspecified site: Secondary | ICD-10-CM | POA: Diagnosis not present

## 2019-04-07 DIAGNOSIS — M0600 Rheumatoid arthritis without rheumatoid factor, unspecified site: Secondary | ICD-10-CM | POA: Diagnosis not present

## 2019-05-05 DIAGNOSIS — M0600 Rheumatoid arthritis without rheumatoid factor, unspecified site: Secondary | ICD-10-CM | POA: Diagnosis not present

## 2019-05-12 ENCOUNTER — Other Ambulatory Visit: Payer: Self-pay

## 2019-05-12 ENCOUNTER — Ambulatory Visit (INDEPENDENT_AMBULATORY_CARE_PROVIDER_SITE_OTHER): Payer: Medicare Other | Admitting: Internal Medicine

## 2019-05-12 ENCOUNTER — Telehealth: Payer: Self-pay | Admitting: Internal Medicine

## 2019-05-12 ENCOUNTER — Encounter: Payer: Self-pay | Admitting: Internal Medicine

## 2019-05-12 VITALS — BP 140/78 | HR 68 | Temp 98.5°F | Ht 66.5 in | Wt 197.0 lb

## 2019-05-12 DIAGNOSIS — K219 Gastro-esophageal reflux disease without esophagitis: Secondary | ICD-10-CM | POA: Diagnosis not present

## 2019-05-12 DIAGNOSIS — Z0000 Encounter for general adult medical examination without abnormal findings: Secondary | ICD-10-CM

## 2019-05-12 DIAGNOSIS — N401 Enlarged prostate with lower urinary tract symptoms: Secondary | ICD-10-CM | POA: Diagnosis not present

## 2019-05-12 DIAGNOSIS — Z7189 Other specified counseling: Secondary | ICD-10-CM | POA: Diagnosis not present

## 2019-05-12 DIAGNOSIS — R0600 Dyspnea, unspecified: Secondary | ICD-10-CM | POA: Diagnosis not present

## 2019-05-12 DIAGNOSIS — I10 Essential (primary) hypertension: Secondary | ICD-10-CM

## 2019-05-12 DIAGNOSIS — M06049 Rheumatoid arthritis without rheumatoid factor, unspecified hand: Secondary | ICD-10-CM | POA: Diagnosis not present

## 2019-05-12 DIAGNOSIS — Z23 Encounter for immunization: Secondary | ICD-10-CM

## 2019-05-12 DIAGNOSIS — J410 Simple chronic bronchitis: Secondary | ICD-10-CM | POA: Diagnosis not present

## 2019-05-12 DIAGNOSIS — R0609 Other forms of dyspnea: Secondary | ICD-10-CM | POA: Insufficient documentation

## 2019-05-12 MED ORDER — ALFUZOSIN HCL ER 10 MG PO TB24: 10.0000 mg | ORAL_TABLET | Freq: Every day | ORAL | 3 refills | Status: DC

## 2019-05-12 MED ORDER — AMLODIPINE BESYLATE 5 MG PO TABS: 5.0000 mg | ORAL_TABLET | Freq: Every day | ORAL | 3 refills | Status: DC

## 2019-05-12 MED ORDER — FINASTERIDE 5 MG PO TABS: 5.0000 mg | ORAL_TABLET | Freq: Every day | ORAL | 3 refills | Status: DC

## 2019-05-12 MED ORDER — LOSARTAN POTASSIUM 100 MG PO TABS: 100.0000 mg | ORAL_TABLET | Freq: Every day | ORAL | 3 refills | Status: DC

## 2019-05-12 MED ORDER — PRAVASTATIN SODIUM 40 MG PO TABS: 40.0000 mg | ORAL_TABLET | Freq: Every day | ORAL | 3 refills | Status: DC

## 2019-05-12 MED ORDER — FLUTICASONE PROPIONATE 50 MCG/ACT NA SUSP: 2.0000 | Freq: Every day | NASAL | 3 refills | Status: DC | PRN

## 2019-05-12 MED ORDER — POTASSIUM CHLORIDE CRYS ER 20 MEQ PO TBCR: 20.0000 meq | EXTENDED_RELEASE_TABLET | Freq: Every day | ORAL | 3 refills | Status: DC

## 2019-05-12 NOTE — Progress Notes (Signed)
Subjective:    Patient ID: Thomas Hudson, male    DOB: [birth year], 78 y.o.   MRN: [id number]  HPI Here for Medicare wellness visit and follow up of chronic health conditions Reviewed advanced directives Reviewed other doctors No alcohol or tobacco No hospitalizations or surgery in the past year Other doctors---Dr Syed---rheumatology, Dr Eskridge---urology, Dr Kathrine Cords, Dr Bell--dentist, Dr Renea Ee, ?[facility]--derm Vision okay Hearing is not great---not ready for aides No falls No depression or anhedonia Tries to walk every day--- as much as 4 miles (limited more by hip and leg) Independent with instrumental ADLs No sig memory issues  Daughters are concerned about strong FH of MI and stents  Still has chronic congestion Still with regular productive cough Stable DOE---with any activities. Will get tired and sweat easily No chest pain Does get racing heart at times----"sometimes it is just flying"  Continues with Dr Dossie Der Is on cimzia, prednisone and leflunomide Recent labs reviewed  Fair control of the arthritis--other than in feet  Occasional reflux symptoms despite the prilosec No dysphagia Due for another EGD  Current Outpatient Medications on File Prior to Visit  Medication Sig Dispense Refill  . albuterol (PROVENTIL HFA;VENTOLIN HFA) 108 (90 Base) MCG/ACT inhaler Inhale 2 puffs into the lungs every 6 (six) hours as needed for wheezing or shortness of breath. 1 Inhaler 2  . alfuzosin (UROXATRAL) 10 MG 24 hr tablet Take 1 tablet (10 mg total) by mouth daily. TAKE 1 TABLET BY MOUTH DAILY (Patient taking differently: Take 10 mg by mouth daily. ) 90 tablet 3  . amLODipine (NORVASC) 5 MG tablet Take 1 tablet (5 mg total) by mouth daily. 90 tablet 3  . aspirin-acetaminophen-caffeine (EXCEDRIN MIGRAINE) 250-250-65 MG tablet Take 2 tablets by mouth 3 (three) times daily as needed for headache.    . cetirizine (ZYRTEC) 10 MG tablet Take 1 tablet (10 mg total) by  mouth at bedtime. 90 tablet 3  . Cholecalciferol (VITAMIN D) 2000 units tablet Take 2,000 Units by mouth daily.    Marland Kitchen erythromycin ophthalmic ointment Place 1 application into both eyes daily as needed (dry eyes).    . ferrous sulfate 325 (65 FE) MG tablet Take 1 tablet (325 mg total) by mouth 3 (three) times daily with meals. 270 tablet 3  . finasteride (PROSCAR) 5 MG tablet Take 1 tablet (5 mg total) by mouth daily. 90 tablet 3  . fluticasone (FLONASE) 50 MCG/ACT nasal spray Place 2 sprays into both nostrils daily as needed for allergies. 48 g 3  . folic acid (FOLVITE) 1 MG tablet Take 1 tablet by mouth daily.  3  . leflunomide (ARAVA) 20 MG tablet Take 1 tablet by mouth daily.    Marland Kitchen losartan (COZAAR) 100 MG tablet Take 1 tablet (100 mg total) by mouth daily. 90 tablet 3  . Misc Natural Products (TURMERIC CURCUMIN) CAPS Take 1 capsule by mouth daily.    . montelukast (SINGULAIR) 10 MG tablet Take 1 tablet (10 mg total) by mouth at bedtime. 90 tablet 3  . omeprazole (PRILOSEC) 20 MG capsule Take 1 capsule (20 mg total) by mouth daily. 90 capsule 3  . potassium chloride SA (K-DUR,KLOR-CON) 20 MEQ tablet Take 1 tablet (20 mEq total) by mouth daily. 90 tablet 3  . pravastatin (PRAVACHOL) 40 MG tablet Take 1 tablet (40 mg total) by mouth daily. 90 tablet 3  . predniSONE (DELTASONE) 10 MG tablet Take 1 tablet by mouth daily.     No current facility-administered medications on  file prior to visit.     Allergies  Allergen Reactions  . Morphine And Related     Itching & rash Because of a history of documented adverse serious drug reaction;Medi Alert bracelet  is recommended    Past Medical History:  Diagnosis Date  . BPH (benign prostatic hyperplasia)   . Carpal tunnel syndrome on both sides   . GERD (gastroesophageal reflux disease)   . Hyperlipidemia   . Hypertension   . RAD (reactive airway disease)    no PMH of asthma; RAD post infection  . Rheumatoid arthritis [facility])         Past  Surgical History:  Procedure Laterality Date  . BIOPSY  01/06/2018   Procedure: BIOPSY;  Surgeon: Irene Shipper, MD;  Location: [facility] ENDOSCOPY;  Service: Endoscopy;;  . COLONOSCOPY W/ POLYPECTOMY  2009   X 2; Dr.Perry   . ERCP N/A 11/05/2016   Procedure: ENDOSCOPIC RETROGRADE CHOLANGIOPANCREATOGRAPHY (ERCP);  Surgeon: Irene Shipper, MD;  Location: [facility] ENDOSCOPY;  Service: Endoscopy;  Laterality: N/A;  . ESOPHAGOGASTRODUODENOSCOPY (EGD) WITH PROPOFOL N/A 07/29/2016   Procedure: ESOPHAGOGASTRODUODENOSCOPY (EGD) WITH PROPOFOL;  Surgeon: Irene Shipper, MD;  Location: [facility] ENDOSCOPY;  Service: Endoscopy;  Laterality: N/A;  will need ERCP scope  . ESOPHAGOGASTRODUODENOSCOPY (EGD) WITH PROPOFOL N/A 01/06/2018   Procedure: ESOPHAGOGASTRODUODENOSCOPY (EGD) WITH PROPOFOL;  Surgeon: Irene Shipper, MD;  Location: [facility] ENDOSCOPY;  Service: Endoscopy;  Laterality: N/A;  NEED ERCP SCOPE  . HERNIA REPAIR    . LUMBAR LAMINECTOMY  07/2008  . UPPER GASTROINTESTINAL ENDOSCOPY      gastric polyp;Dr.Perry   . VASECTOMY    . WHIPPLE PROCEDURE  2012   partial ; [facility]    Family History  Problem Relation Age of Onset  . Throat cancer Father        smoker  . Esophageal cancer Father   . Prostate cancer Paternal Uncle   . Heart attack Paternal Uncle        3 uncles, >55  . Coronary artery disease Brother        in 57s  . Esophageal cancer Brother   . Skin cancer Mother   . Throat cancer Brother   . Heart disease Brother   . Uterine cancer Sister   . COPD Sister        X71  . Ovarian cancer Sister   . Stroke Paternal Grandmother 28  . Colon cancer Paternal Grandfather   . Heart disease Paternal Grandfather   . Heart disease Maternal Grandfather   . Throat cancer Sister        smoker  . Breast cancer Sister   . Lung cancer Sister        smoker  . Leukemia Sister   . Heart disease Brother   . Asthma Neg Hx     Social History   Socioeconomic History  . Marital status: Married    Spouse name: Not on file  .  Number of children: 2  . Years of education: Not on file  . Highest education level: Not on file  Occupational History  . 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 Financial resource strain: Not on file  . Food insecurity    Worry: Not on file    Inability: Not on file  . Transportation needs    Medical: Not on file    Non-medical: Not on file  Tobacco Use  . Smoking status: Former  Smoker    Packs/day: 1.00    Years: 50.00    Pack years: 50.00    Types: Cigarettes    Quit date: 07/08/1997    Years since quitting: 21.8  . Smokeless tobacco: Never Used  . Tobacco comment: smoked age [age], up to 1 ppd  Substance and Sexual Activity  . Alcohol use: No    Alcohol/week: 0.0 standard drinks  . Drug use: No  . Sexual activity: Yes  Lifestyle  . Physical activity    Days per week: Not on file    Minutes per session: Not on file  . Stress: Not on file  Relationships  . Social Herbalist on phone: Not on file    Gets together: Not on file    Attends religious service: Not on file    Active member of club or organization: Not on file    Attends meetings of clubs or organizations: Not on file    Relationship status: Not on file  . Intimate partner violence    Fear of current or ex partner: Not on file    Emotionally abused: Not on file    Physically abused: Not on file    Forced sexual activity: Not on file  Other Topics Concern  . Not on file  Social History Narrative   No living will   Requests wife as health care POA--- then daughters   Would accept resuscitation attempts   Not sure about tube feeds   Review of Systems Appetite is fine Weight is creeping up Not sleeping as well--only abut 4 hours. No sig daytime somnolence (unless just sitting around) Teeth okay No skin rash or suspicious lesions Bowels move fine---no blood Occasional nosebleed Voids okay--nocturia 0-1. Does well on the finasteride/afluzosin     Objective:   Physical Exam  Constitutional: He appears well-developed. No distress.  HENT:  Mouth/Throat: Oropharynx is clear and moist. No oropharyngeal exudate.  Neck: No thyromegaly present.  Cardiovascular: Normal rate, regular rhythm, normal heart sounds and intact distal pulses. Exam reveals no gallop.  No murmur heard. Respiratory: Effort normal. No respiratory distress. He has no wheezes. He has no rales.  Decreased breath sounds but clear  GI: Soft. He exhibits no distension. There is no abdominal tenderness. There is no rebound and no guarding.  Small bulge over umbilicus---not clearly a hernia  Musculoskeletal:        General: No tenderness or edema.  Lymphadenopathy:    He has no cervical adenopathy.  Neurological:  President--- "Trump, Obama, Clinton----Bush" Doesn't do numbers D-l-r-o-w Recall 3/3  Skin: No rash noted. No erythema.  Psychiatric: He has a normal mood and affect. His behavior is normal.           Assessment & Plan:

## 2019-05-12 NOTE — Assessment & Plan Note (Signed)
See social history 

## 2019-05-12 NOTE — Assessment & Plan Note (Signed)
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

## 2019-05-12 NOTE — Telephone Encounter (Signed)
Still not able to find it in Epic. I will have to call them in.

## 2019-05-12 NOTE — Assessment & Plan Note (Signed)
BP Readings from Last 3 Encounters:  05/12/19 140/78  05/06/18 140/78  01/07/18 140/76   Reasonable control

## 2019-05-12 NOTE — Assessment & Plan Note (Signed)
I have personally reviewed the Medicare Annual Wellness questionnaire and have noted 1. The patient's medical and social history 2. Their use of alcohol, tobacco or illicit drugs 3. Their current medications and supplements 4. The patient's functional ability including ADL's, fall risks, home safety risks and hearing or visual             impairment. 5. Diet and physical activities 6. Evidence for depression or mood disorders  The patients weight, height, BMI and visual acuity have been recorded in the chart I have made referrals, counseling and provided education to the patient based review of the above and I have provided the pt with a written personalized care plan for preventive services.  I have provided you with a copy of your personalized plan for preventive services. Please take the time to review along with your updated medication list.  Flu vaccine today Done with colonoscopies for screening Tries to stay active Consider shingrix No PSA due to age

## 2019-05-12 NOTE — Addendum Note (Signed)
Addended by: Pilar Grammes on: 05/12/2019 10:53 AM   Modules accepted: Orders

## 2019-05-12 NOTE — Telephone Encounter (Signed)
I called the number and they provided me the zip code to be able to find it electronically! They do not take call in rxs, but I was able to find the pharmacy in [place]. Rxs sent electronically.

## 2019-05-12 NOTE — Telephone Encounter (Signed)
Pt called stating [facility]  [phone] Didn't have his rx and wanted you to call them in  He didn't know the names of the meds he needs everything to be refilled   He stated you have a list of his meds

## 2019-05-12 NOTE — Assessment & Plan Note (Signed)
Fine on dual therapy I can prescribe these for him if he wants

## 2019-05-12 NOTE — Assessment & Plan Note (Signed)
Doing fairly well with regimen from Dr Dossie Der She checks blood work

## 2019-05-12 NOTE — Assessment & Plan Note (Signed)
Not severe and is stable Could be related to lung disease Strong FH of CAD though and family concerned Some palpitations as well Will set up with cardiology

## 2019-05-12 NOTE — Assessment & Plan Note (Signed)
Stable Some DOE--- will look into other etiologies

## 2019-05-24 ENCOUNTER — Telehealth: Payer: Self-pay | Admitting: Internal Medicine

## 2019-05-24 DIAGNOSIS — K219 Gastro-esophageal reflux disease without esophagitis: Secondary | ICD-10-CM

## 2019-05-24 DIAGNOSIS — J302 Other seasonal allergic rhinitis: Secondary | ICD-10-CM

## 2019-05-24 MED ORDER — MONTELUKAST SODIUM 10 MG PO TABS: 10.0000 mg | ORAL_TABLET | Freq: Every day | ORAL | 3 refills | Status: DC

## 2019-05-24 MED ORDER — FERROUS SULFATE 325 (65 FE) MG PO TABS: 325.0000 mg | ORAL_TABLET | Freq: Three times a day (TID) | ORAL | 3 refills | Status: DC

## 2019-05-24 MED ORDER — CETIRIZINE HCL 10 MG PO TABS: 10.0000 mg | ORAL_TABLET | Freq: Every day | ORAL | 3 refills | Status: DC

## 2019-05-24 MED ORDER — OMEPRAZOLE 20 MG PO CPDR: 20.0000 mg | DELAYED_RELEASE_CAPSULE | Freq: Every day | ORAL | 3 refills | Status: DC

## 2019-05-24 NOTE — Telephone Encounter (Signed)
Patient called.  Patient was in for a cpx in November.  Patient said 4 prescriptions weren't refilled- Ferrous Sulfate,Cetririzine, Montelukast, Omeprazole. Patient uses [facility], [employer].

## 2019-05-24 NOTE — Telephone Encounter (Signed)
Rxs sent electronically. Left message on VM for pt

## 2019-05-24 NOTE — Telephone Encounter (Signed)
Patient's requesting a call back when medications are sent in.

## 2019-05-24 NOTE — Telephone Encounter (Signed)
I already took care of this in another message.

## 2019-05-24 NOTE — Telephone Encounter (Signed)
[facility] Night - Client TELEPHONE ADVICE RECORD [facility] Patient Name: Thomas Hudson Gender: Male DOB: 04-30-1941 Age: 78 Y 1 M 11 D Return Phone Number: [phone] (Primary), [phone] (Secondary) Address: City/State/Zip: [place] [address] Client  [facility] [place] [facility] - Client Client Site [place] Physician Viviana Simpler - MD Contact Type Call Who Is Calling Patient / Member / Family / Caregiver Call Type Triage / Clinical Relationship To Patient Self Return Phone Number [phone] (Primary) Chief Complaint Prescription Refill or Medication Request (non symptomatic) Reason for Call Medication Question / Request Initial Comment Caller states he had his medications called [facility] and they missed 5 of them. Translation No Nurse Assessment Guidelines Guideline Title Affirmed Question Affirmed Notes Nurse Date/Time (Eastern Time) Disp. Time Eilene Ghazi Time) Disposition Final User 05/22/2019 4:49:44 PM FINAL ATTEMPT MADE - message left Yes Dew, RN, Levada Dy

## 2019-06-02 DIAGNOSIS — M0600 Rheumatoid arthritis without rheumatoid factor, unspecified site: Secondary | ICD-10-CM | POA: Diagnosis not present

## 2019-06-08 DIAGNOSIS — M06841 Other specified rheumatoid arthritis, right hand: Secondary | ICD-10-CM | POA: Diagnosis not present

## 2019-06-08 DIAGNOSIS — M79671 Pain in right foot: Secondary | ICD-10-CM | POA: Diagnosis not present

## 2019-06-08 DIAGNOSIS — Z79899 Other long term (current) drug therapy: Secondary | ICD-10-CM | POA: Diagnosis not present

## 2019-06-08 DIAGNOSIS — M0600 Rheumatoid arthritis without rheumatoid factor, unspecified site: Secondary | ICD-10-CM | POA: Diagnosis not present

## 2019-06-08 DIAGNOSIS — M79672 Pain in left foot: Secondary | ICD-10-CM | POA: Diagnosis not present

## 2019-06-08 DIAGNOSIS — M06071 Rheumatoid arthritis without rheumatoid factor, right ankle and foot: Secondary | ICD-10-CM | POA: Diagnosis not present

## 2019-06-08 DIAGNOSIS — M79643 Pain in unspecified hand: Secondary | ICD-10-CM | POA: Diagnosis not present

## 2019-06-08 DIAGNOSIS — M5136 Other intervertebral disc degeneration, lumbar region: Secondary | ICD-10-CM | POA: Diagnosis not present

## 2019-06-08 DIAGNOSIS — M150 Primary generalized (osteo)arthritis: Secondary | ICD-10-CM | POA: Diagnosis not present

## 2019-06-08 DIAGNOSIS — M06072 Rheumatoid arthritis without rheumatoid factor, left ankle and foot: Secondary | ICD-10-CM | POA: Diagnosis not present

## 2019-06-08 DIAGNOSIS — M19042 Primary osteoarthritis, left hand: Secondary | ICD-10-CM | POA: Diagnosis not present

## 2019-06-08 DIAGNOSIS — M1990 Unspecified osteoarthritis, unspecified site: Secondary | ICD-10-CM | POA: Diagnosis not present

## 2019-06-08 DIAGNOSIS — M25571 Pain in right ankle and joints of right foot: Secondary | ICD-10-CM | POA: Diagnosis not present

## 2019-06-08 DIAGNOSIS — M79642 Pain in left hand: Secondary | ICD-10-CM | POA: Diagnosis not present

## 2019-06-08 DIAGNOSIS — M19041 Primary osteoarthritis, right hand: Secondary | ICD-10-CM | POA: Diagnosis not present

## 2019-06-08 DIAGNOSIS — M25572 Pain in left ankle and joints of left foot: Secondary | ICD-10-CM | POA: Diagnosis not present

## 2019-06-08 DIAGNOSIS — M79641 Pain in right hand: Secondary | ICD-10-CM | POA: Diagnosis not present

## 2019-06-16 ENCOUNTER — Ambulatory Visit: Payer: Medicare Other | Admitting: Cardiovascular Disease

## 2019-06-16 ENCOUNTER — Telehealth: Payer: Self-pay

## 2019-06-16 DIAGNOSIS — I10 Essential (primary) hypertension: Secondary | ICD-10-CM

## 2019-06-16 MED ORDER — PRAVASTATIN SODIUM 40 MG PO TABS: 40.0000 mg | ORAL_TABLET | Freq: Every day | ORAL | 0 refills | Status: DC

## 2019-06-16 MED ORDER — FINASTERIDE 5 MG PO TABS: 5.0000 mg | ORAL_TABLET | Freq: Every day | ORAL | 0 refills | Status: DC

## 2019-06-16 MED ORDER — LOSARTAN POTASSIUM 100 MG PO TABS: 100.0000 mg | ORAL_TABLET | Freq: Every day | ORAL | 0 refills | Status: DC

## 2019-06-16 MED ORDER — ALFUZOSIN HCL ER 10 MG PO TB24: 10.0000 mg | ORAL_TABLET | Freq: Every day | ORAL | 0 refills | Status: DC

## 2019-06-16 MED ORDER — AMLODIPINE BESYLATE 5 MG PO TABS: 5.0000 mg | ORAL_TABLET | Freq: Every day | ORAL | 0 refills | Status: DC

## 2019-06-16 NOTE — Telephone Encounter (Signed)
3 days

## 2019-06-16 NOTE — Telephone Encounter (Signed)
Pt's daughter left v/m that pt is out of town on vacation and pt left his med at home and request cb to pt to get refills sent to [facility] in [place]. I was unable to speak with pt but left v/m for pt to cb with names of meds that needs refill and to verify which [facility] pt wants meds to go to.

## 2019-06-16 NOTE — Telephone Encounter (Signed)
Patient's daughter returned call.  Patient needs a refill on his blood pressure medication , cholesterol medication,and prostate.  [facility]-phone number [phone] address [id number]. Patient's daughter's requesting a call when the medication is sent in at [phone]-Sarah,daughter.

## 2019-06-16 NOTE — Telephone Encounter (Signed)
I had to call back and get more clarification because he is on 2 BP Meds and  2 "prostate" meds. Pt said both. He nad daughter said he only needed 3 days worth. Then said if insurance does a vacation override that he would get more.

## 2019-07-16 ENCOUNTER — Other Ambulatory Visit: Payer: Self-pay

## 2019-07-16 ENCOUNTER — Ambulatory Visit (INDEPENDENT_AMBULATORY_CARE_PROVIDER_SITE_OTHER): Payer: Medicare Other | Admitting: Cardiovascular Disease

## 2019-07-16 ENCOUNTER — Encounter: Payer: Self-pay | Admitting: Cardiovascular Disease

## 2019-07-16 VITALS — BP 132/62 | HR 83 | Ht 66.5 in | Wt 202.0 lb

## 2019-07-16 DIAGNOSIS — I208 Other forms of angina pectoris: Secondary | ICD-10-CM

## 2019-07-16 DIAGNOSIS — R0602 Shortness of breath: Secondary | ICD-10-CM | POA: Diagnosis not present

## 2019-07-16 DIAGNOSIS — R0600 Dyspnea, unspecified: Secondary | ICD-10-CM | POA: Diagnosis not present

## 2019-07-16 DIAGNOSIS — E785 Hyperlipidemia, unspecified: Secondary | ICD-10-CM | POA: Diagnosis not present

## 2019-07-16 DIAGNOSIS — R9431 Abnormal electrocardiogram [ECG] [EKG]: Secondary | ICD-10-CM

## 2019-07-16 DIAGNOSIS — I10 Essential (primary) hypertension: Secondary | ICD-10-CM | POA: Diagnosis not present

## 2019-07-16 DIAGNOSIS — R0609 Other forms of dyspnea: Secondary | ICD-10-CM

## 2019-07-16 LAB — BASIC METABOLIC PANEL
BUN/Creatinine Ratio: 10 (ref 10–24)
BUN: 12 mg/dL (ref 8–27)
CO2: 22 mmol/L (ref 20–29)
Calcium: 9.2 mg/dL (ref 8.6–10.2)
Chloride: 106 mmol/L (ref 96–106)
Creatinine, Ser: 1.22 mg/dL (ref 0.76–1.27)
GFR calc Af Amer: 66 mL/min/{1.73_m2} (ref >59)
GFR calc non Af Amer: 57 mL/min/{1.73_m2} — ABNORMAL LOW (ref >59)
Glucose: 107 mg/dL — ABNORMAL HIGH (ref 65–99)
Potassium: 4 mmol/L (ref 3.5–5.2)
Sodium: 140 mmol/L (ref 134–144)

## 2019-07-16 LAB — LIPID PANEL
Chol/HDL Ratio: 3.3 ratio (ref 0.0–5.0)
Cholesterol, Total: 161 mg/dL (ref 100–199)
HDL: 49 mg/dL (ref >39)
LDL Chol Calc (NIH): 92 mg/dL (ref 0–99)
Triglycerides: 110 mg/dL (ref 0–149)
VLDL Cholesterol Cal: 20 mg/dL (ref 5–40)

## 2019-07-16 MED ORDER — METOPROLOL TARTRATE 50 MG PO TABS: ORAL_TABLET | ORAL | 0 refills | Status: DC

## 2019-07-16 MED ORDER — ASPIRIN EC 81 MG PO TBEC: 81.0000 mg | DELAYED_RELEASE_TABLET | Freq: Every day | ORAL | Status: DC

## 2019-07-16 NOTE — Progress Notes (Signed)
Cardiology Office Note:    Date:  07/16/2019   ID:  Thomas Hudson, DOB 08/07/1940, MRN [id number]  PCP:  [name], MD  Cardiologist:  Courtez Twaddle  Electrophysiologist:  None   Referring MD: [name], MD   Chief Complaint  Patient presents with  . Shortness of Breath    History of Present Illness:    Thomas Hudson is a 79 y.o. male with a hx of DOE .  We are asked to see him today for further eval of his DOE by Dr. Silvio Pate  Hx of HTN, arthritis,  HLD  Has been on chronic prednisone due to arthritis .   Has DOE with waking ,  When hes active. About a year  No real chest  Had an episode of pleuretic CP the other day .  Associated with diaphoresis  Seems to be gradually worsening   No regularl exercise  No fever, cough, cold symptoms.  Has some sinus issues  Has been trying to limit his salt    .   Still eats hot dogs and bacon   Worked in Psychologist, educational for [employer] .  Quit smoking in 1999   Past Medical History:  Diagnosis Date  . BPH (benign prostatic hyperplasia)   . Carpal tunnel syndrome on both sides   . GERD (gastroesophageal reflux disease)   . Hyperlipidemia   . Hypertension   . RAD (reactive airway disease)    no PMH of asthma; RAD post infection  . Rheumatoid arthritis [facility])         Past Surgical History:  Procedure Laterality Date  . BIOPSY  01/06/2018   Procedure: BIOPSY;  Surgeon: Irene Shipper, MD;  Location: [facility];  Service: Endoscopy;;  . COLONOSCOPY W/ POLYPECTOMY  2009   X 2; Dr.Perry   . ERCP N/A 11/05/2016   Procedure: ENDOSCOPIC RETROGRADE CHOLANGIOPANCREATOGRAPHY (ERCP);  Surgeon: Irene Shipper, MD;  Location: [facility] ENDOSCOPY;  Service: Endoscopy;  Laterality: N/A;  . ESOPHAGOGASTRODUODENOSCOPY (EGD) WITH PROPOFOL N/A 07/29/2016   Procedure: ESOPHAGOGASTRODUODENOSCOPY (EGD) WITH PROPOFOL;  Surgeon: Irene Shipper, MD;  Location: [facility];  Service: Endoscopy;  Laterality: N/A;  will need ERCP scope  . ESOPHAGOGASTRODUODENOSCOPY  (EGD) WITH PROPOFOL N/A 01/06/2018   Procedure: ESOPHAGOGASTRODUODENOSCOPY (EGD) WITH PROPOFOL;  Surgeon: Irene Shipper, MD;  Location: [facility];  Service: Endoscopy;  Laterality: N/A;  NEED ERCP SCOPE  . HERNIA REPAIR    . LUMBAR LAMINECTOMY  07/2008  . UPPER GASTROINTESTINAL ENDOSCOPY      gastric polyp;Dr.Perry   . VASECTOMY    . WHIPPLE PROCEDURE  2012   partial ; [facility]    Current Medications: Current Meds  Medication Sig  . alfuzosin (UROXATRAL) 10 MG 24 hr tablet Take 1 tablet (10 mg total) by mouth daily. TAKE 1 TABLET BY MOUTH DAILY  . amLODipine (NORVASC) 5 MG tablet Take 1 tablet (5 mg total) by mouth daily.  Marland Kitchen aspirin-acetaminophen-caffeine (EXCEDRIN MIGRAINE) 250-250-65 MG tablet Take 2 tablets by mouth 3 (three) times daily as needed for headache.  . Certolizumab Pegol (CIMZIA) 2 X 200 MG KIT Inject 2 each into the skin every 30 (thirty) days.  . cetirizine (ZYRTEC) 10 MG tablet Take 1 tablet (10 mg total) by mouth at bedtime.  Marland Kitchen erythromycin ophthalmic ointment Place 1 application into both eyes daily as needed (dry eyes).  . ferrous sulfate 325 (65 FE) MG tablet Take 1 tablet (325 mg total) by mouth 3 (three) times daily with meals.  Marland Kitchen  finasteride (PROSCAR) 5 MG tablet Take 1 tablet (5 mg total) by mouth daily.  . fluticasone (FLONASE) 50 MCG/ACT nasal spray Place 2 sprays into both nostrils daily as needed for allergies.  . folic acid (FOLVITE) 1 MG tablet Take 1 tablet by mouth daily.  Marland Kitchen leflunomide (ARAVA) 20 MG tablet Take 1 tablet by mouth daily.  Marland Kitchen losartan (COZAAR) 100 MG tablet Take 1 tablet (100 mg total) by mouth daily.  . montelukast (SINGULAIR) 10 MG tablet Take 1 tablet (10 mg total) by mouth at bedtime.  Marland Kitchen omeprazole (PRILOSEC) 20 MG capsule Take 1 capsule (20 mg total) by mouth daily.  . potassium chloride SA (KLOR-CON) 20 MEQ tablet Take 1 tablet (20 mEq total) by mouth daily.  . pravastatin (PRAVACHOL) 40 MG tablet Take 1 tablet (40 mg total) by mouth  daily.  . predniSONE (DELTASONE) 10 MG tablet Take 1 tablet by mouth daily.     Allergies:   Morphine and related   Social History   Socioeconomic History  . Marital status: Married    Spouse name: Not on file  . Number of children: 2  . Years of education: Not on file  . Highest education level: Not on file  Occupational History  . 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 Smoking status: Former Smoker    Packs/day: 1.00    Years: 50.00    Pack years: 50.00    Types: Cigarettes    Quit date: 07/08/1997    Years since quitting: 22.0  . Smokeless tobacco: Never Used  . Tobacco comment: smoked age [age], up to 1 ppd  Substance and Sexual Activity  . Alcohol use: No    Alcohol/week: 0.0 standard drinks  . Drug use: No  . Sexual activity: Yes  Other Topics Concern  . Not on file  Social History Narrative   No living will   Requests wife as health care POA--- then daughters   Would accept resuscitation attempts   Not sure about tube feeds   Social Determinants of Health   Financial Resource Strain:   . Difficulty of Paying Living Expenses: Not on file  Food Insecurity:   . Worried About Charity fundraiser in the Last Year: Not on file  . Ran Out of Food in the Last Year: Not on file  Transportation Needs:   . Lack of Transportation (Medical): Not on file  . Lack of Transportation (Non-Medical): Not on file  Physical Activity:   . Days of Exercise per Week: Not on file  . Minutes of Exercise per Session: Not on file  Stress:   . Feeling of Stress : Not on file  Social Connections:   . Frequency of Communication with Friends and Family: Not on file  . Frequency of Social Gatherings with Friends and Family: Not on file  . Attends Religious Services: Not on file  . Active Member of Clubs or Organizations: Not on file  . Attends Archivist Meetings: Not on file  . Marital Status: Not on file     Family History: The  patient's family history includes Breast cancer in his sister; COPD in his sister; Colon cancer in his paternal grandfather; Coronary artery disease in his brother; Esophageal cancer in his brother and father; Heart attack in his paternal uncle; Heart disease in his brother, brother, maternal grandfather, and paternal grandfather; Leukemia in his sister; Lung cancer in his sister; Ovarian cancer  in his sister; Prostate cancer in his paternal uncle; Skin cancer in his mother; Stroke (age of onset: 36) in his paternal grandmother; Throat cancer in his brother, father, and sister; Uterine cancer in his sister. There is no history of Asthma.  ROS:   Please see the history of present illness.     All other systems reviewed and are negative.  EKGs/Labs/Other Studies Reviewed:    The following studies were reviewed today:   EKG:   Jul 16, 2019 NSR at 73.    Occas. PVC,  Nonspecific  ST /  T wave abn  Recent Labs: 07/16/2019: BUN 12; Creatinine, Ser 1.22; Potassium 4.0; Sodium 140  Recent Lipid Panel    Component Value Date/Time   CHOL 161 07/16/2019 0947   TRIG 110 07/16/2019 0947   HDL 49 07/16/2019 0947   CHOLHDL 3.3 07/16/2019 0947   CHOLHDL 4 05/02/2017 0853   VLDL 26.0 05/02/2017 0853   LDLCALC 92 07/16/2019 0947   LDLDIRECT 156.2 08/08/2010 1143    Physical Exam:    VS:  BP 132/62   Pulse 83   Ht 5' 6.5" (1.689 m)   Wt 202 lb (91.6 kg)   SpO2 95%   BMI 32.12 kg/m     Wt Readings from Last 3 Encounters:  07/16/19 202 lb (91.6 kg)  05/12/19 197 lb (89.4 kg)  05/06/18 188 lb (85.3 kg)     GEN:  Well nourished, well developed in no acute distress HEENT: Normal NECK: No JVD; No carotid bruits LYMPHATICS: No lymphadenopathy CARDIAC: RRR, no murmurs, rubs, gallops RESPIRATORY:  Clear to auscultation without rales, wheezing or rhonchi  ABDOMEN: Soft, non-tender, non-distended MUSCULOSKELETAL:  No edema; No deformity  SKIN: Warm and dry NEUROLOGIC:  Alert and oriented x 3  PSYCHIATRIC:  Normal affect   ASSESSMENT:    1. DOE (dyspnea on exertion)   2. Shortness of breath   3. Hyperlipidemia, unspecified hyperlipidemia type   4. Essential hypertension   5. Anginal equivalent ([place])   6. Abnormal EKG    PLAN:    In order of problems listed above:  1. Shortness of breath: The patient presents with a year history of progressive shortness of breath with exertion.  He has a strong family history of coronary artery disease.  His sister is Levy Sjogren who I have seen for years.  He has nonspecific ST and T wave changes on his EKG.  I would like to get a coronary CT angiogram for further evaluation of this shortness of breath with exertion.  It could be an angina equivalent.  We will also be getting an echocardiogram to evaluate his valvular function and LV function.  There is a possibility that this is all due to generalized deconditioning.  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  He has been confined to the house for the most part.  Advised him to try to start a regular exercise program and to become more active.  2.  Hyperlipidemia: He is currently on Pravachol.  He is not had any lipid levels since 2018.   We will draw lipids today.   Medication Adjustments/Labs and Tests Ordered: Current medicines are reviewed at length with the patient today.  Concerns regarding medicines are outlined above.  Orders Placed This Encounter  Procedures  . CT CORONARY MORPH W/CTA COR W/SCORE W/CA W/CM &/OR WO/CM  .  CT CORONARY FRACTIONAL FLOW RESERVE DATA PREP  . CT CORONARY FRACTIONAL FLOW RESERVE FLUID ANALYSIS  . Lipid Profile  . Basic Metabolic Panel (BMET)  . EKG 12-Lead cs  . ECHOCARDIOGRAM COMPLETE   Meds ordered this encounter  Medications  . aspirin EC 81 MG tablet    Sig: Take 1 tablet (81 mg total) by mouth daily.    Dispense:     . metoprolol tartrate  (LOPRESSOR) 50 MG tablet    Sig: Take 1 pill 2 hours before your CT    Dispense:  1 tablet    Refill:  0     Patient Instructions  Medication Instructions:  Your physician has recommended you make the following change in your medication:  START Aspirin 81 mg once daily  *If you need a refill on your cardiac medications before your next appointment, please call your pharmacy*  Lab Work: TODAY - cholesterol, basic metabolic panel If you have labs (blood work) drawn today and your tests are completely normal, you will receive your results only by: Marland Kitchen MyChart Message (if you have MyChart) OR . A paper copy in the mail If you have any lab test that is abnormal or we need to change your treatment, we will call you to review the results.  Testing/Procedures: Your physician has requested that you have an echocardiogram. Echocardiography is a painless test that uses sound waves to create images of your heart. It provides your doctor with information about the size and shape of your heart and how well your heart's chambers and valves are working. This procedure takes approximately one hour. There are no restrictions for this procedure.  Your cardiac CT will be scheduled at one of the below locations:   [facility] [address] [place], [place] [address] (336) [place] [address] [place], [place] [address] [phone]  If scheduled at [facility], please arrive at the [facility] main entrance of [facility] 30-45 minutes prior to test start time. Proceed to the [facility] Radiology Department (first floor) to check-in and test prep.  If scheduled at [facility], please arrive 15 mins early for check-in and test prep.  Please follow these instructions carefully (unless otherwise directed):  Hold all erectile dysfunction medications at least 3 days (72 hrs) prior  to test.  On the Night Before the Test: . Be sure to Drink plenty of water. . Do not consume any caffeinated/decaffeinated beverages or chocolate 12 hours prior to your test. . Do not take any antihistamines 12 hours prior to your test.  On the Day of the Test: . Drink plenty of water. Do not drink any water within one hour of the test. . Do not eat any food 4 hours prior to the test. . You may take your regular medications prior to the test.  . Take metoprolol (Lopressor) two hours prior to test.       After the Test: . Drink plenty of water. . After receiving IV contrast, you may experience a mild flushed feeling. This is normal. . On occasion, you may experience a mild rash up to 24 hours after the test. This is not dangerous. If this occurs, you can take Benadryl 25 mg and increase your fluid intake. . If you experience trouble breathing, this can be serious. If it is severe call 911 IMMEDIATELY. If it is mild, please call our office. . If you  take any of these medications: Glipizide/Metformin, Avandament, Glucavance, please do not take 48 hours after completing test unless otherwise instructed.   Once we have confirmed authorization from your insurance company, we will call you to set up a date and time for your test.   For non-scheduling related questions, please contact the cardiac imaging nurse navigator should you have any questions/concerns: Marchia Bond, RN Navigator Cardiac Imaging [facility] [phone] Office     Follow-Up: At [facility], you and your health needs are our priority.  As part of our continuing mission to provide you with exceptional heart care, we have created designated Provider Care Teams.  These Care Teams include your primary Cardiologist (physician) and Advanced Practice Providers (APPs -  Physician Assistants and Nurse Practitioners) who all work together to provide you with the care you need, when you need it.   Your next appointment:   3 month(s)  The format for your next appointment:   In Person  Provider:   Mertie Moores, MD       Signed, Mertie Moores, MD  07/16/2019 5:24 PM    [place]

## 2019-07-16 NOTE — Patient Instructions (Addendum)
Medication Instructions:  Your physician has recommended you make the following change in your medication:  START Aspirin 81 mg once daily  *If you need a refill on your cardiac medications before your next appointment, please call your pharmacy*  Lab Work: TODAY - cholesterol, basic metabolic panel If you have labs (blood work) drawn today and your tests are completely normal, you will receive your results only by: Marland Kitchen MyChart Message (if you have MyChart) OR . A paper copy in the mail If you have any lab test that is abnormal or we need to change your treatment, we will call you to review the results.  Testing/Procedures: Your physician has requested that you have an echocardiogram. Echocardiography is a painless test that uses sound waves to create images of your heart. It provides your doctor with information about the size and shape of your heart and how well your heart's chambers and valves are working. This procedure takes approximately one hour. There are no restrictions for this procedure.  Your cardiac CT will be scheduled at one of the below locations:   [facility] [address] [place], [place] [address] (336) [place] [address] [place], [place] [address] [phone]  If scheduled at [facility], please arrive at the [facility] main entrance of [facility] 30-45 minutes prior to test start time. Proceed to the [facility] Radiology Department (first floor) to check-in and test prep.  If scheduled at [facility], please arrive 15 mins early for check-in and test prep.  Please follow these instructions carefully (unless otherwise directed):  Hold all erectile dysfunction medications at least 3 days (72 hrs) prior to test.  On the Night Before the Test: . Be sure to Drink plenty of water. . Do not consume any caffeinated/decaffeinated beverages or  chocolate 12 hours prior to your test. . Do not take any antihistamines 12 hours prior to your test.  On the Day of the Test: . Drink plenty of water. Do not drink any water within one hour of the test. . Do not eat any food 4 hours prior to the test. . You may take your regular medications prior to the test.  . Take metoprolol (Lopressor) two hours prior to test.       After the Test: . Drink plenty of water. . After receiving IV contrast, you may experience a mild flushed feeling. This is normal. . On occasion, you may experience a mild rash up to 24 hours after the test. This is not dangerous. If this occurs, you can take Benadryl 25 mg and increase your fluid intake. . If you experience trouble breathing, this can be serious. If it is severe call 911 IMMEDIATELY. If it is mild, please call our office. . If you take any of these medications: Glipizide/Metformin, Avandament, Glucavance, please do not take 48 hours after completing test unless otherwise instructed.   Once we have confirmed authorization from your insurance company, we will call you to set up a date and time for your test.   For non-scheduling related questions, please contact the cardiac imaging nurse navigator should you have any questions/concerns: Marchia Bond, RN Navigator Cardiac Imaging [facility] [phone] Office     Follow-Up: At [facility], you and your health needs are our priority.  As part of our continuing mission to provide you with exceptional heart care, we have created designated  Provider Care Teams.  These Care Teams include your primary Cardiologist (physician) and Advanced Practice Providers (APPs -  Physician Assistants and Nurse Practitioners) who all work together to provide you with the care you need, when you need it.  Your next appointment:   3 month(s)  The format for your next appointment:   In Person  Provider:   Mertie Moores, MD

## 2019-07-20 ENCOUNTER — Telehealth: Payer: Self-pay | Admitting: Nurse Practitioner

## 2019-07-20 DIAGNOSIS — I10 Essential (primary) hypertension: Secondary | ICD-10-CM

## 2019-07-20 DIAGNOSIS — R9431 Abnormal electrocardiogram [ECG] [EKG]: Secondary | ICD-10-CM

## 2019-07-20 DIAGNOSIS — E785 Hyperlipidemia, unspecified: Secondary | ICD-10-CM

## 2019-07-20 MED ORDER — ROSUVASTATIN CALCIUM 10 MG PO TABS: 10.0000 mg | ORAL_TABLET | Freq: Every day | ORAL | 0 refills | Status: DC

## 2019-07-20 NOTE — Telephone Encounter (Signed)
Reviewed results and plan of care with patient. He verbalized understanding and agreement. He requests #90 be sent to local pharmacy and then will want refills to go to [facility] in the future. He states he will call back when he needs the paper prescription for [facility]. I advised that at that time I will make him a lab appointment to recheck cholesterol. Patient thanked me for the call.

## 2019-07-20 NOTE — Telephone Encounter (Signed)
-----   Message from Thayer Headings, MD sent at 07/16/2019  5:13 PM EST ----- Currently on pravachol LDL is 92.  DC pravachol.  Start rosuvastatin 10 mg a day  Check lipids , liver, bmp in 3 months

## 2019-07-21 DIAGNOSIS — Z20822 Contact with and (suspected) exposure to covid-19: Secondary | ICD-10-CM | POA: Diagnosis not present

## 2019-07-28 DIAGNOSIS — M0600 Rheumatoid arthritis without rheumatoid factor, unspecified site: Secondary | ICD-10-CM | POA: Diagnosis not present

## 2019-07-30 ENCOUNTER — Other Ambulatory Visit (HOSPITAL_COMMUNITY): Payer: Medicare Other

## 2019-08-02 DIAGNOSIS — Z85828 Personal history of other malignant neoplasm of skin: Secondary | ICD-10-CM | POA: Diagnosis not present

## 2019-08-02 DIAGNOSIS — L570 Actinic keratosis: Secondary | ICD-10-CM | POA: Diagnosis not present

## 2019-08-02 DIAGNOSIS — L738 Other specified follicular disorders: Secondary | ICD-10-CM | POA: Diagnosis not present

## 2019-08-02 DIAGNOSIS — L720 Epidermal cyst: Secondary | ICD-10-CM | POA: Diagnosis not present

## 2019-08-02 DIAGNOSIS — D225 Melanocytic nevi of trunk: Secondary | ICD-10-CM | POA: Diagnosis not present

## 2019-08-02 DIAGNOSIS — L821 Other seborrheic keratosis: Secondary | ICD-10-CM | POA: Diagnosis not present

## 2019-08-06 ENCOUNTER — Other Ambulatory Visit: Payer: Medicare Other | Admitting: *Deleted

## 2019-08-06 ENCOUNTER — Other Ambulatory Visit: Payer: Self-pay

## 2019-08-06 ENCOUNTER — Ambulatory Visit (HOSPITAL_COMMUNITY): Payer: Medicare Other | Attending: Cardiovascular Disease

## 2019-08-06 DIAGNOSIS — R0602 Shortness of breath: Secondary | ICD-10-CM | POA: Diagnosis not present

## 2019-08-06 DIAGNOSIS — R9431 Abnormal electrocardiogram [ECG] [EKG]: Secondary | ICD-10-CM

## 2019-08-06 DIAGNOSIS — I10 Essential (primary) hypertension: Secondary | ICD-10-CM | POA: Diagnosis not present

## 2019-08-06 DIAGNOSIS — R0609 Other forms of dyspnea: Secondary | ICD-10-CM

## 2019-08-06 DIAGNOSIS — R0600 Dyspnea, unspecified: Secondary | ICD-10-CM | POA: Insufficient documentation

## 2019-08-06 DIAGNOSIS — E785 Hyperlipidemia, unspecified: Secondary | ICD-10-CM | POA: Diagnosis not present

## 2019-08-07 LAB — HEPATIC FUNCTION PANEL
ALT: 15 IU/L (ref 0–44)
AST: 18 IU/L (ref 0–40)
Albumin: 4.1 g/dL (ref 3.7–4.7)
Alkaline Phosphatase: 66 IU/L (ref 39–117)
Bilirubin Total: 0.5 mg/dL (ref 0.0–1.2)
Bilirubin, Direct: 0.16 mg/dL (ref 0.00–0.40)
Total Protein: 6.1 g/dL (ref 6.0–8.5)

## 2019-08-07 LAB — BASIC METABOLIC PANEL
BUN/Creatinine Ratio: 11 (ref 10–24)
BUN: 12 mg/dL (ref 8–27)
CO2: 23 mmol/L (ref 20–29)
Calcium: 9.2 mg/dL (ref 8.6–10.2)
Chloride: 107 mmol/L — ABNORMAL HIGH (ref 96–106)
Creatinine, Ser: 1.12 mg/dL (ref 0.76–1.27)
GFR calc Af Amer: 73 mL/min/{1.73_m2} (ref >59)
GFR calc non Af Amer: 63 mL/min/{1.73_m2} (ref >59)
Glucose: 147 mg/dL — ABNORMAL HIGH (ref 65–99)
Potassium: 4.1 mmol/L (ref 3.5–5.2)
Sodium: 141 mmol/L (ref 134–144)

## 2019-08-23 DIAGNOSIS — M545 Low back pain: Secondary | ICD-10-CM | POA: Diagnosis not present

## 2019-08-23 DIAGNOSIS — M25552 Pain in left hip: Secondary | ICD-10-CM | POA: Diagnosis not present

## 2019-08-25 DIAGNOSIS — M0600 Rheumatoid arthritis without rheumatoid factor, unspecified site: Secondary | ICD-10-CM | POA: Diagnosis not present

## 2019-08-30 ENCOUNTER — Telehealth: Payer: Self-pay

## 2019-08-30 NOTE — Telephone Encounter (Signed)
Spoke with Dr. Meda Coffee who advised patient's ESI should not interfere with his cor CT on Friday as long as the patient is able to lie flat. Patient states he was able to have MRI without difficulty so he thinks he will be fine to have the CT on Friday. He thanked me for the call.

## 2019-08-30 NOTE — Telephone Encounter (Signed)
New Message    Patient is having a Er procedure Thursday 09/02/19 for a bulging disk in his back , they are going to give him cortisone shots as well, should he old off on getting the cardiac ct or will it be ok to do on Friday 09/03/19  Sharyn Lull will you let me know if we should keep him scheduled or to reschedule at later date I told the daughter I am not clinical and that was not my decision to make.

## 2019-09-02 ENCOUNTER — Telehealth (HOSPITAL_COMMUNITY): Payer: Self-pay | Admitting: Emergency Medicine

## 2019-09-02 ENCOUNTER — Encounter (HOSPITAL_COMMUNITY): Payer: Self-pay

## 2019-09-02 DIAGNOSIS — M5136 Other intervertebral disc degeneration, lumbar region: Secondary | ICD-10-CM | POA: Diagnosis not present

## 2019-09-02 NOTE — Telephone Encounter (Signed)
Reaching out to patient to offer assistance regarding upcoming cardiac imaging study; pt verbalizes understanding of appt date/time, parking situation and where to check in, pre-test NPO status and medications ordered, and verified current allergies; name and call back number provided for further questions should they arise Marchia Bond RN [place] [facility] [phone] office [phone] cell  Answered patient questions but states daughter had questions, gave my mobile number so she could call me when she returns  Clarise Cruz

## 2019-09-03 ENCOUNTER — Other Ambulatory Visit: Payer: Self-pay

## 2019-09-03 ENCOUNTER — Ambulatory Visit (HOSPITAL_COMMUNITY)
Admission: RE | Admit: 2019-09-03 | Discharge: 2019-09-03 | Disposition: A | Discharge status: Home / Self Care | Payer: Medicare Other | Source: Ambulatory Visit | Attending: Cardiovascular Disease | Admitting: Cardiovascular Disease

## 2019-09-03 ENCOUNTER — Encounter (HOSPITAL_COMMUNITY): Payer: Self-pay

## 2019-09-03 DIAGNOSIS — R9431 Abnormal electrocardiogram [ECG] [EKG]: Secondary | ICD-10-CM | POA: Insufficient documentation

## 2019-09-03 DIAGNOSIS — R0602 Shortness of breath: Secondary | ICD-10-CM | POA: Insufficient documentation

## 2019-09-03 DIAGNOSIS — I208 Other forms of angina pectoris: Secondary | ICD-10-CM | POA: Insufficient documentation

## 2019-09-03 MED ORDER — IOHEXOL 350 MG/ML SOLN
80.0000 mL | Freq: Once | INTRAVENOUS | Status: AC | PRN
  Administered 2019-09-03: 80 mL via INTRAVENOUS

## 2019-09-03 MED ORDER — NITROGLYCERIN 0.4 MG SL SUBL: 0.8000 mg | SUBLINGUAL_TABLET | Freq: Once | SUBLINGUAL | Status: AC

## 2019-09-03 MED ORDER — NITROGLYCERIN 0.4 MG SL SUBL
SUBLINGUAL_TABLET | SUBLINGUAL | Status: AC
  Administered 2019-09-03: 0.8 mg via SUBLINGUAL
  Filled 2019-09-03: qty 2

## 2019-09-06 ENCOUNTER — Other Ambulatory Visit: Payer: Self-pay | Admitting: Nurse Practitioner

## 2019-09-06 DIAGNOSIS — M150 Primary generalized (osteo)arthritis: Secondary | ICD-10-CM | POA: Diagnosis not present

## 2019-09-06 DIAGNOSIS — M79671 Pain in right foot: Secondary | ICD-10-CM | POA: Diagnosis not present

## 2019-09-06 DIAGNOSIS — M0600 Rheumatoid arthritis without rheumatoid factor, unspecified site: Secondary | ICD-10-CM | POA: Diagnosis not present

## 2019-09-06 DIAGNOSIS — E785 Hyperlipidemia, unspecified: Secondary | ICD-10-CM

## 2019-09-06 DIAGNOSIS — M79643 Pain in unspecified hand: Secondary | ICD-10-CM | POA: Diagnosis not present

## 2019-09-06 DIAGNOSIS — M5136 Other intervertebral disc degeneration, lumbar region: Secondary | ICD-10-CM | POA: Diagnosis not present

## 2019-09-06 DIAGNOSIS — Z79899 Other long term (current) drug therapy: Secondary | ICD-10-CM | POA: Diagnosis not present

## 2019-09-06 DIAGNOSIS — M1990 Unspecified osteoarthritis, unspecified site: Secondary | ICD-10-CM | POA: Diagnosis not present

## 2019-09-17 DIAGNOSIS — M545 Low back pain: Secondary | ICD-10-CM | POA: Diagnosis not present

## 2019-09-22 DIAGNOSIS — M0600 Rheumatoid arthritis without rheumatoid factor, unspecified site: Secondary | ICD-10-CM | POA: Diagnosis not present

## 2019-09-30 DIAGNOSIS — M25569 Pain in unspecified knee: Secondary | ICD-10-CM | POA: Diagnosis not present

## 2019-09-30 DIAGNOSIS — Z79899 Other long term (current) drug therapy: Secondary | ICD-10-CM | POA: Diagnosis not present

## 2019-09-30 DIAGNOSIS — M0600 Rheumatoid arthritis without rheumatoid factor, unspecified site: Secondary | ICD-10-CM | POA: Diagnosis not present

## 2019-09-30 DIAGNOSIS — M25562 Pain in left knee: Secondary | ICD-10-CM | POA: Diagnosis not present

## 2019-09-30 DIAGNOSIS — M25461 Effusion, right knee: Secondary | ICD-10-CM | POA: Diagnosis not present

## 2019-09-30 DIAGNOSIS — M79671 Pain in right foot: Secondary | ICD-10-CM | POA: Diagnosis not present

## 2019-09-30 DIAGNOSIS — M150 Primary generalized (osteo)arthritis: Secondary | ICD-10-CM | POA: Diagnosis not present

## 2019-09-30 DIAGNOSIS — M25561 Pain in right knee: Secondary | ICD-10-CM | POA: Diagnosis not present

## 2019-09-30 DIAGNOSIS — M79643 Pain in unspecified hand: Secondary | ICD-10-CM | POA: Diagnosis not present

## 2019-09-30 DIAGNOSIS — M1990 Unspecified osteoarthritis, unspecified site: Secondary | ICD-10-CM | POA: Diagnosis not present

## 2019-09-30 DIAGNOSIS — M5136 Other intervertebral disc degeneration, lumbar region: Secondary | ICD-10-CM | POA: Diagnosis not present

## 2019-10-01 DIAGNOSIS — M545 Low back pain: Secondary | ICD-10-CM | POA: Diagnosis not present

## 2019-10-13 ENCOUNTER — Other Ambulatory Visit: Payer: Self-pay

## 2019-10-13 ENCOUNTER — Other Ambulatory Visit: Payer: Medicare Other | Admitting: *Deleted

## 2019-10-13 DIAGNOSIS — E785 Hyperlipidemia, unspecified: Secondary | ICD-10-CM

## 2019-10-13 DIAGNOSIS — R9431 Abnormal electrocardiogram [ECG] [EKG]: Secondary | ICD-10-CM

## 2019-10-13 DIAGNOSIS — I10 Essential (primary) hypertension: Secondary | ICD-10-CM

## 2019-10-13 LAB — HEPATIC FUNCTION PANEL
ALT: 21 IU/L (ref 0–44)
AST: 17 IU/L (ref 0–40)
Albumin: 4.3 g/dL (ref 3.7–4.7)
Alkaline Phosphatase: 67 IU/L (ref 39–117)
Bilirubin Total: 0.9 mg/dL (ref 0.0–1.2)
Bilirubin, Direct: 0.29 mg/dL (ref 0.00–0.40)
Total Protein: 6.3 g/dL (ref 6.0–8.5)

## 2019-10-13 LAB — LIPID PANEL
Chol/HDL Ratio: 2.6 ratio (ref 0.0–5.0)
Cholesterol, Total: 140 mg/dL (ref 100–199)
HDL: 54 mg/dL (ref >39)
LDL Chol Calc (NIH): 69 mg/dL (ref 0–99)
Triglycerides: 87 mg/dL (ref 0–149)
VLDL Cholesterol Cal: 17 mg/dL (ref 5–40)

## 2019-10-15 ENCOUNTER — Ambulatory Visit (INDEPENDENT_AMBULATORY_CARE_PROVIDER_SITE_OTHER): Payer: Medicare Other | Admitting: Cardiovascular Disease

## 2019-10-15 ENCOUNTER — Other Ambulatory Visit: Payer: Self-pay

## 2019-10-15 ENCOUNTER — Encounter: Payer: Self-pay | Admitting: Cardiovascular Disease

## 2019-10-15 VITALS — BP 128/68 | HR 82 | Ht 66.5 in | Wt 193.5 lb

## 2019-10-15 DIAGNOSIS — R0609 Other forms of dyspnea: Secondary | ICD-10-CM

## 2019-10-15 DIAGNOSIS — I10 Essential (primary) hypertension: Secondary | ICD-10-CM

## 2019-10-15 DIAGNOSIS — I208 Other forms of angina pectoris: Secondary | ICD-10-CM

## 2019-10-15 DIAGNOSIS — R0600 Dyspnea, unspecified: Secondary | ICD-10-CM

## 2019-10-15 NOTE — Patient Instructions (Signed)
Medication Instructions:  Your physician recommends that you continue on your current medications as directed. Please refer to the Current Medication list given to you today.  *If you need a refill on your cardiac medications before your next appointment, please call your pharmacy*   Lab Work: None Ordered If you have labs (blood work) drawn today and your tests are completely normal, you will receive your results only by: . MyChart Message (if you have MyChart) OR . A paper copy in the mail If you have any lab test that is abnormal or we need to change your treatment, we will call you to review the results.   Testing/Procedures: None Ordered   Follow-Up: At [facility], you and your health needs are our priority.  As part of our continuing mission to provide you with exceptional heart care, we have created designated Provider Care Teams.  These Care Teams include your primary Cardiologist (physician) and Advanced Practice Providers (APPs -  Physician Assistants and Nurse Practitioners) who all work together to provide you with the care you need, when you need it.   Your next appointment:    As Needed  The format for your next appointment:   Either In Person or Virtual  Provider:   You may see Philip Nahser, MD or one of the following Advanced Practice Providers on your designated Care Team:    Scott Weaver, PA-C  Vin Bhagat, PA-C  Janine Hammond, NP     

## 2019-10-15 NOTE — Progress Notes (Signed)
Cardiology Office Note:    Date:  10/15/2019   ID:  Thomas Hudson, DOB 1940-12-03, MRN [id number]  PCP:  [name], MD  Cardiologist:  Teryl Mcconaghy  Electrophysiologist:  None   Referring MD: [name], MD   Chief Complaint  Patient presents with  . Shortness of Breath    History of Present Illness:    Thomas Hudson is a 79 y.o. male with a hx of DOE .  We are asked to see him today for further eval of his DOE by Dr. Silvio Pate  Hx of HTN, arthritis,  HLD  Has been on chronic prednisone due to arthritis .   Has DOE with waking ,  When hes active. About a year  No real chest  Had an episode of pleuretic CP the other day .  Associated with diaphoresis  Seems to be gradually worsening   No regularl exercise  No fever, cough, cold symptoms.  Has some sinus issues  Has been trying to limit his salt    .   Still eats hot dogs and bacon   Worked in Psychologist, educational for [employer] .  Quit smoking in 1999  October 15, 2019:  We were presents for further evaluation of some shortness of breath.  We performed a coronary CT angiogram for further evaluation of his shortness of breath. Coronary calcium score of 202. This was 44th percentile for age and sex matched control.   He was found to have mild to moderate coronary artery disease in the LAD, circumflex artery and the right coronary artery.  There were no obstructive lesions.  Echocardiogram reveals normal LV function and normal valvular function.  Has not been walking much.  Has had a pinched nerve.   recentl labs look great      Past Medical History:  Diagnosis Date  . BPH (benign prostatic hyperplasia)   . Carpal tunnel syndrome on both sides   . GERD (gastroesophageal reflux disease)   . Hyperlipidemia   . Hypertension   . RAD (reactive airway disease)    no PMH of asthma; RAD post infection  . Rheumatoid arthritis [facility])         Past Surgical History:  Procedure Laterality Date  . BIOPSY  01/06/2018   Procedure:  BIOPSY;  Surgeon: Irene Shipper, MD;  Location: [facility] ENDOSCOPY;  Service: Endoscopy;;  . COLONOSCOPY W/ POLYPECTOMY  2009   X 2; Dr.Perry   . ERCP N/A 11/05/2016   Procedure: ENDOSCOPIC RETROGRADE CHOLANGIOPANCREATOGRAPHY (ERCP);  Surgeon: Irene Shipper, MD;  Location: [facility] ENDOSCOPY;  Service: Endoscopy;  Laterality: N/A;  . ESOPHAGOGASTRODUODENOSCOPY (EGD) WITH PROPOFOL N/A 07/29/2016   Procedure: ESOPHAGOGASTRODUODENOSCOPY (EGD) WITH PROPOFOL;  Surgeon: Irene Shipper, MD;  Location: [facility] ENDOSCOPY;  Service: Endoscopy;  Laterality: N/A;  will need ERCP scope  . ESOPHAGOGASTRODUODENOSCOPY (EGD) WITH PROPOFOL N/A 01/06/2018   Procedure: ESOPHAGOGASTRODUODENOSCOPY (EGD) WITH PROPOFOL;  Surgeon: Irene Shipper, MD;  Location: [facility] ENDOSCOPY;  Service: Endoscopy;  Laterality: N/A;  NEED ERCP SCOPE  . HERNIA REPAIR    . LUMBAR LAMINECTOMY  07/2008  . UPPER GASTROINTESTINAL ENDOSCOPY      gastric polyp;Dr.Perry   . VASECTOMY    . WHIPPLE PROCEDURE  2012   partial ; [facility]    Current Medications: Current Meds  Medication Sig  . albuterol (PROVENTIL HFA;VENTOLIN HFA) 108 (90 Base) MCG/ACT inhaler Inhale 2 puffs into the lungs every 6 (six) hours as needed for wheezing or shortness of breath.  Marland Kitchen  alfuzosin (UROXATRAL) 10 MG 24 hr tablet Take 1 tablet (10 mg total) by mouth daily. TAKE 1 TABLET BY MOUTH DAILY  . amLODipine (NORVASC) 5 MG tablet Take 1 tablet (5 mg total) by mouth daily.  Marland Kitchen aspirin EC 81 MG tablet Take 1 tablet (81 mg total) by mouth daily.  Marland Kitchen aspirin-acetaminophen-caffeine (EXCEDRIN MIGRAINE) 250-250-65 MG tablet Take 2 tablets by mouth 3 (three) times daily as needed for headache.  . Certolizumab Pegol (CIMZIA) 2 X 200 MG KIT Inject 2 each into the skin every 30 (thirty) days.  . cetirizine (ZYRTEC) 10 MG tablet Take 1 tablet (10 mg total) by mouth at bedtime.  . Cholecalciferol (VITAMIN D) 2000 units tablet Take 2,000 Units by mouth daily.  Marland Kitchen erythromycin ophthalmic ointment Place 1 application  into both eyes daily as needed (dry eyes).  . ferrous sulfate 325 (65 FE) MG tablet Take 1 tablet (325 mg total) by mouth 3 (three) times daily with meals.  . finasteride (PROSCAR) 5 MG tablet Take 1 tablet (5 mg total) by mouth daily.  . fluticasone (FLONASE) 50 MCG/ACT nasal spray Place 2 sprays into both nostrils daily as needed for allergies.  . folic acid (FOLVITE) 1 MG tablet Take 1 tablet by mouth daily.  Marland Kitchen gabapentin (NEURONTIN) 300 MG capsule Take 600 mg by mouth 2 (two) times daily.  Marland Kitchen leflunomide (ARAVA) 20 MG tablet Take 1 tablet by mouth daily.  Marland Kitchen losartan (COZAAR) 100 MG tablet Take 1 tablet (100 mg total) by mouth daily.  . metoprolol tartrate (LOPRESSOR) 50 MG tablet Take 1 pill 2 hours before your CT  . montelukast (SINGULAIR) 10 MG tablet Take 1 tablet (10 mg total) by mouth at bedtime.  Marland Kitchen omeprazole (PRILOSEC) 20 MG capsule Take 1 capsule (20 mg total) by mouth daily.  . potassium chloride SA (KLOR-CON) 20 MEQ tablet Take 1 tablet (20 mEq total) by mouth daily.  . predniSONE (DELTASONE) 5 MG tablet Take 5 mg by mouth every other day.  . rosuvastatin (CRESTOR) 10 MG tablet Take 1 tablet (10 mg total) by mouth daily.  . [DISCONTINUED] predniSONE (DELTASONE) 10 MG tablet Take 1 tablet by mouth daily.     Allergies:   Morphine and related   Social History   Socioeconomic History  . Marital status: Married    Spouse name: Not on file  . Number of children: 2  . Years of education: Not on file  . Highest education level: Not on file  Occupational History  . 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 Smoking status: Former Smoker    Packs/day: 1.00    Years: 50.00    Pack years: 50.00    Types: Cigarettes    Quit date: 07/08/1997    Years since quitting: 22.2  . Smokeless tobacco: Never Used  . Tobacco comment: smoked age [age], up to 1 ppd  Substance and Sexual Activity  . Alcohol use: No    Alcohol/week: 0.0 standard drinks    . Drug use: No  . Sexual activity: Yes  Other Topics Concern  . Not on file  Social History Narrative   No living will   Requests wife as health care POA--- then daughters   Would accept resuscitation attempts   Not sure about tube feeds   Social Determinants of Health   Financial Resource Strain:   . Difficulty of Paying Living Expenses:   Food Insecurity:   . Worried About  Running Out of Food in the Last Year:   . [place] in the Last Year:   Transportation Needs:   . Lack of Transportation (Medical):   Marland Kitchen Lack of Transportation (Non-Medical):   Physical Activity:   . Days of Exercise per Week:   . Minutes of Exercise per Session:   Stress:   . Feeling of Stress :   Social Connections:   . Frequency of Communication with Friends and Family:   . Frequency of Social Gatherings with Friends and Family:   . Attends Religious Services:   . Active Member of Clubs or Organizations:   . Attends Archivist Meetings:   Marland Kitchen Marital Status:      Family History: The patient's family history includes Breast cancer in his sister; COPD in his sister; Colon cancer in his paternal grandfather; Coronary artery disease in his brother; Esophageal cancer in his brother and father; Heart attack in his paternal uncle; Heart disease in his brother, brother, maternal grandfather, and paternal grandfather; Leukemia in his sister; Lung cancer in his sister; Ovarian cancer in his sister; Prostate cancer in his paternal uncle; Skin cancer in his mother; Stroke (age of onset: 61) in his paternal grandmother; Throat cancer in his brother, father, and sister; Uterine cancer in his sister. There is no history of Asthma.  ROS:   Please see the history of present illness.     All other systems reviewed and are negative.  EKGs/Labs/Other Studies Reviewed:    The following studies were reviewed today:   EKG:      Recent Labs: 08/06/2019: BUN 12; Creatinine, Ser 1.12; Potassium 4.1; Sodium  141 10/13/2019: ALT 21  Recent Lipid Panel    Component Value Date/Time   CHOL 140 10/13/2019 0934   TRIG 87 10/13/2019 0934   HDL 54 10/13/2019 0934   CHOLHDL 2.6 10/13/2019 0934   CHOLHDL 4 05/02/2017 0853   VLDL 26.0 05/02/2017 0853   LDLCALC 69 10/13/2019 0934   LDLDIRECT 156.2 08/08/2010 1143    Physical Exam:    Physical Exam: Blood pressure 128/68, pulse 82, height 5' 6.5" (1.689 m), weight 193 lb 8 oz (87.8 kg), SpO2 95 %.  GEN:  Elderly male,  NAD  HEENT: Normal NECK: No JVD; No carotid bruits LYMPHATICS: No lymphadenopathy CARDIAC: RRR , no murmurs, rubs, gallops RESPIRATORY:  Clear to auscultation without rales, wheezing or rhonchi  ABDOMEN: Soft, non-tender, non-distended MUSCULOSKELETAL:  No edema; No deformity  SKIN: Warm and dry NEUROLOGIC:  Alert and oriented x 3    ASSESSMENT:    No diagnosis found. PLAN:    In order of problems listed above:  1.  Shortness of breath:     I do not think that his shortness of breath is from a cardiac etiology.  His echocardiogram reveals normal left ventricular systolic function.  We were unable to evaluate his diastolic dysfunction.  He has normal valvular function.  Coronary CT angiogram reveals no significant coronary artery disease.  He does have some coronary artery calcifications and is on rosuvastatin.  His last lipid levels look great.  At this point we will see him on an as-needed basis.  He does not need any further cardiac work-up at this time.  2.  Hyperlipidemia: His last lipid levels look great.  Continue his rosuvastatin.  These will be followed by Dr. Silvio Pate.    Medication Adjustments/Labs and Tests Ordered: Current medicines are reviewed at length with the patient today.  Concerns regarding  medicines are outlined above.  No orders of the defined types were placed in this encounter.  No orders of the defined types were placed in this encounter.    Patient Instructions  Medication Instructions:    Your physician recommends that you continue on your current medications as directed. Please refer to the Current Medication list given to you today.  *If you need a refill on your cardiac medications before your next appointment, please call your pharmacy*   Lab Work: None Ordered If you have labs (blood work) drawn today and your tests are completely normal, you will receive your results only by: Marland Kitchen MyChart Message (if you have MyChart) OR . A paper copy in the mail If you have any lab test that is abnormal or we need to change your treatment, we will call you to review the results.   Testing/Procedures: None Ordered   Follow-Up: At [facility], you and your health needs are our priority.  As part of our continuing mission to provide you with exceptional heart care, we have created designated Provider Care Teams.  These Care Teams include your primary Cardiologist (physician) and Advanced Practice Providers (APPs -  Physician Assistants and Nurse Practitioners) who all work together to provide you with the care you need, when you need it.   Your next appointment:    As Needed  The format for your next appointment:   Either In Person or Virtual  Provider:   You may see Mertie Moores, MD or one of the following Advanced Practice Providers on your designated Care Team:    Richardson Dopp, PA-C  Vin Meadowbrook, [place]  Daune Perch, [place]        Signed, Mertie Moores, MD  10/15/2019 10:22 AM    [place]

## 2019-10-20 DIAGNOSIS — M0600 Rheumatoid arthritis without rheumatoid factor, unspecified site: Secondary | ICD-10-CM | POA: Diagnosis not present

## 2019-11-17 DIAGNOSIS — M0600 Rheumatoid arthritis without rheumatoid factor, unspecified site: Secondary | ICD-10-CM | POA: Diagnosis not present

## 2019-12-03 ENCOUNTER — Telehealth: Payer: Self-pay | Admitting: Internal Medicine

## 2019-12-03 NOTE — Telephone Encounter (Signed)
Patient called to schedule EGD. Patient has recall to schedule w ERCP at [facility]

## 2019-12-07 NOTE — Telephone Encounter (Signed)
Patient calling to follow up on previous message please call to schedule

## 2019-12-08 ENCOUNTER — Other Ambulatory Visit: Payer: Self-pay

## 2019-12-08 MED ORDER — ROSUVASTATIN CALCIUM 10 MG PO TABS: 10.0000 mg | ORAL_TABLET | Freq: Every day | ORAL | 3 refills | Status: DC

## 2019-12-09 ENCOUNTER — Other Ambulatory Visit: Payer: Self-pay

## 2019-12-09 DIAGNOSIS — D132 Benign neoplasm of duodenum: Secondary | ICD-10-CM

## 2019-12-09 NOTE — Telephone Encounter (Signed)
Pt scheduled for Covid screen 6/24@9 :45am. EGD with ERCP scope scheduled at [facility] 01/03/20@10am , pt to arrive there at 8:30am. Pt to be Npo after midnight. Pt aware of appts and Dr. Henrene Pastor notified of schedule.

## 2019-12-09 NOTE — Telephone Encounter (Signed)
Patient is requesting procedure appt

## 2019-12-15 ENCOUNTER — Encounter: Payer: Self-pay | Admitting: Internal Medicine

## 2019-12-15 DIAGNOSIS — M0600 Rheumatoid arthritis without rheumatoid factor, unspecified site: Secondary | ICD-10-CM | POA: Diagnosis not present

## 2019-12-30 ENCOUNTER — Other Ambulatory Visit (HOSPITAL_COMMUNITY)
Admission: RE | Admit: 2019-12-30 | Discharge: 2019-12-30 | Disposition: A | Discharge status: Home / Self Care | Payer: Medicare Other | Source: Ambulatory Visit | Attending: Internal Medicine | Admitting: Internal Medicine

## 2019-12-30 DIAGNOSIS — Z01812 Encounter for preprocedural laboratory examination: Secondary | ICD-10-CM | POA: Insufficient documentation

## 2019-12-30 DIAGNOSIS — Z20822 Contact with and (suspected) exposure to covid-19: Secondary | ICD-10-CM | POA: Diagnosis not present

## 2019-12-30 LAB — SARS CORONAVIRUS 2 (TAT 6-24 HRS): SARS Coronavirus 2: NEGATIVE

## 2020-01-03 ENCOUNTER — Ambulatory Visit (HOSPITAL_COMMUNITY): Payer: Medicare Other | Admitting: Registered Nurse

## 2020-01-03 ENCOUNTER — Encounter (HOSPITAL_COMMUNITY)
Admission: RE | Disposition: A | Discharge status: Home / Self Care | Payer: Self-pay | Source: Home / Self Care | Attending: Internal Medicine

## 2020-01-03 ENCOUNTER — Ambulatory Visit (HOSPITAL_COMMUNITY)
Admission: RE | Admit: 2020-01-03 | Discharge: 2020-01-03 | Disposition: A | Discharge status: Home / Self Care | Payer: Medicare Other | Attending: Internal Medicine | Admitting: Internal Medicine

## 2020-01-03 ENCOUNTER — Encounter (HOSPITAL_COMMUNITY): Payer: Self-pay | Admitting: Internal Medicine

## 2020-01-03 ENCOUNTER — Other Ambulatory Visit: Payer: Self-pay

## 2020-01-03 DIAGNOSIS — D132 Benign neoplasm of duodenum: Secondary | ICD-10-CM

## 2020-01-03 DIAGNOSIS — I10 Essential (primary) hypertension: Secondary | ICD-10-CM | POA: Insufficient documentation

## 2020-01-03 DIAGNOSIS — D490 Neoplasm of unspecified behavior of digestive system: Secondary | ICD-10-CM | POA: Diagnosis not present

## 2020-01-03 DIAGNOSIS — Z7982 Long term (current) use of aspirin: Secondary | ICD-10-CM | POA: Diagnosis not present

## 2020-01-03 DIAGNOSIS — Z87891 Personal history of nicotine dependence: Secondary | ICD-10-CM | POA: Insufficient documentation

## 2020-01-03 DIAGNOSIS — Z6830 Body mass index (BMI) 30.0-30.9, adult: Secondary | ICD-10-CM | POA: Diagnosis not present

## 2020-01-03 DIAGNOSIS — J45909 Unspecified asthma, uncomplicated: Secondary | ICD-10-CM | POA: Diagnosis not present

## 2020-01-03 DIAGNOSIS — Z79899 Other long term (current) drug therapy: Secondary | ICD-10-CM | POA: Diagnosis not present

## 2020-01-03 DIAGNOSIS — N400 Enlarged prostate without lower urinary tract symptoms: Secondary | ICD-10-CM | POA: Diagnosis not present

## 2020-01-03 DIAGNOSIS — M069 Rheumatoid arthritis, unspecified: Secondary | ICD-10-CM | POA: Diagnosis not present

## 2020-01-03 DIAGNOSIS — K219 Gastro-esophageal reflux disease without esophagitis: Secondary | ICD-10-CM | POA: Insufficient documentation

## 2020-01-03 DIAGNOSIS — E669 Obesity, unspecified: Secondary | ICD-10-CM | POA: Diagnosis not present

## 2020-01-03 DIAGNOSIS — Z7952 Long term (current) use of systemic steroids: Secondary | ICD-10-CM | POA: Insufficient documentation

## 2020-01-03 DIAGNOSIS — R1310 Dysphagia, unspecified: Secondary | ICD-10-CM | POA: Insufficient documentation

## 2020-01-03 DIAGNOSIS — E785 Hyperlipidemia, unspecified: Secondary | ICD-10-CM | POA: Insufficient documentation

## 2020-01-03 HISTORY — PX: ESOPHAGOGASTRODUODENOSCOPY (EGD) WITH PROPOFOL: SHX5813

## 2020-01-03 HISTORY — PX: BIOPSY: SHX5522

## 2020-01-03 SURGERY — ESOPHAGOGASTRODUODENOSCOPY (EGD) WITH PROPOFOL
Anesthesia: Monitor Anesthesia Care

## 2020-01-03 MED ORDER — SODIUM CHLORIDE 0.9 % IV SOLN: INTRAVENOUS | Status: DC

## 2020-01-03 MED ORDER — LACTATED RINGERS IV SOLN: Freq: Once | INTRAVENOUS | Status: AC

## 2020-01-03 MED ORDER — LIDOCAINE 2% (20 MG/ML) 5 ML SYRINGE
INTRAMUSCULAR | Status: DC | PRN
  Administered 2020-01-03: 60 mg via INTRAVENOUS

## 2020-01-03 MED ORDER — PROPOFOL 10 MG/ML IV BOLUS
INTRAVENOUS | Status: DC | PRN
  Administered 2020-01-03: 50 mg via INTRAVENOUS
  Administered 2020-01-03: 40 mg via INTRAVENOUS
  Administered 2020-01-03 (×5): 20 mg via INTRAVENOUS

## 2020-01-03 MED ORDER — PROPOFOL 500 MG/50ML IV EMUL
INTRAVENOUS | Status: AC
  Filled 2020-01-03: qty 50

## 2020-01-03 MED ORDER — LACTATED RINGERS IV SOLN
INTRAVENOUS | Status: DC | PRN
Start: 2020-01-03 — End: 2020-01-03

## 2020-01-03 SURGICAL SUPPLY — 15 items

## 2020-01-03 NOTE — Op Note (Signed)
[facility] Patient Name: Thomas Hudson Procedure Date: 01/03/2020 MRN: [id number] Attending MD: Docia Chuck. 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 Last                            surveillance exam July 2019 with trivial residual                            adenoma).; Also, patient complains of mild                            intermittent dysphagia Providers:                Docia Chuck. Henrene Pastor, MD, Baird Cancer, RN, Corie Chiquito,                            Technician, Marla Roe, CRNA Referring MD:              Medicines:                Monitored Anesthesia Care Complications:            No immediate complications. Estimated Blood Loss:     Estimated blood loss: none. Procedure:                Pre-Anesthesia Assessment:                           - Prior to the procedure, a History and Physical                            was performed, and patient medications and                            allergies were reviewed. The patient's tolerance of                            previous anesthesia was also reviewed. The risks                            and benefits of the procedure and the sedation                            options and risks were discussed with the patient.  All questions were answered, and informed consent                            was obtained. Prior Anticoagulants: The patient has                            taken no previous anticoagulant or antiplatelet                            agents. ASA Grade Assessment: II - A patient with                            mild systemic disease. After reviewing the risks                            and  benefits, the patient was deemed in                            satisfactory condition to undergo the procedure.                           After obtaining informed consent, the endoscope was                            passed under direct vision. Throughout the                            procedure, the patient's blood pressure, pulse, and                            oxygen saturations were monitored continuously. The                            GIF-H190 ([id number]) Olympus gastroscope was                            introduced through the mouth, and advanced to the                            second part of duodenum. The [id number]-Q180V ([id number])                            Olympus Duodenoscope was introduced through the                            mouth, and advanced to the second part of duodenum.                            The upper GI endoscopy was accomplished without                            difficulty. The patient tolerated the procedure  well. Scope In: Scope Out: Findings:      The esophagus was thoroughly examined with end viewing scope and found       to be normal. No stricture or other abnormality. Examination was       subsequently carried out with a side-viewing duodenoscope.      The stomach was normal.      The examined duodenum was normal. The area of prior ampullectomy was       obvious. There was slight whitish tissue which was nonraised to one       side. See photographs. Biopsied.      The cardia and gastric fundus were normal on retroflexion. Impression:               1. Normal esophagus                           2. Status post prior ampullectomy                           3. Slight whitish tissue adjacent to biliary                            orifice status post biopsy                           4. Otherwise normal exam. Moderate Sedation:      none Recommendation:           - Patient has a contact number available for                             emergencies. The signs and symptoms of potential                            delayed complications were discussed with the                            patient. Return to normal activities tomorrow.                            Written discharge instructions were provided to the                            patient.                           - Resume previous diet.                           - Continue present medications.                           - Await pathology results.                           -Follow-up to be determined based on biopsies Procedure Code(s):        --- Professional ---  89842, Esophagogastroduodenoscopy, flexible,                            transoral; diagnostic, including collection of                            specimen(s) by brushing or washing, when performed                            (separate procedure) Diagnosis Code(s):        --- Professional ---                           D49.0, Neoplasm of unspecified behavior of                            digestive system CPT copyright 2019 American Medical Association. All rights reserved. The codes documented in this report are preliminary and upon coder review may  be revised to meet current compliance requirements. Docia Chuck. Henrene Pastor, MD 01/03/2020 10:04:18 AM This report has been signed electronically. Number of Addenda: 0

## 2020-01-03 NOTE — H&P (Signed)
HISTORY OF PRESENT ILLNESS:  Thomas Hudson is a 79 y.o. 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  Recurrent disease identified for which he underwent ampullectomy at [facility]) 2018.  Last surveillance exam July 2019 with trivial residual adenoma outside of the ampullary region.  Now for follow-up.  Patient does mention new complaint of intermittent dysphagia.  He points to the cervical esophagus region.  REVIEW OF SYSTEMS:  All non-GI ROS negative unless otherwise stated in the HPI  Past Medical History:  Diagnosis Date  . BPH (benign prostatic hyperplasia)   . Carpal tunnel syndrome on both sides   . GERD (gastroesophageal reflux disease)   . Hyperlipidemia   . Hypertension   . RAD (reactive airway disease)    no PMH of asthma; RAD post infection  . Rheumatoid arthritis [facility])         Past Surgical History:  Procedure Laterality Date  . BIOPSY  01/06/2018   Procedure: BIOPSY;  Surgeon: Irene Shipper, MD;  [facility];  Service: Endoscopy;;  . COLONOSCOPY W/ POLYPECTOMY  2009   X 2; Dr.Ailani Governale   . ERCP N/A 11/05/2016   Procedure: ENDOSCOPIC RETROGRADE CHOLANGIOPANCREATOGRAPHY (ERCP);  Surgeon: Irene Shipper, MD;  Location: [facility] ENDOSCOPY;  Service: Endoscopy;  Laterality: N/A;  . ESOPHAGOGASTRODUODENOSCOPY (EGD) WITH PROPOFOL N/A 07/29/2016   Procedure: ESOPHAGOGASTRODUODENOSCOPY (EGD) WITH PROPOFOL;  Surgeon: Irene Shipper, MD;  Location[facility] ENDOSCOPY;  Service: Endoscopy;  Laterality: N/A;  will need ERCP scope  . ESOPHAGOGASTRODUODENOSCOPY (EGD) WITH PROPOFOL N/A 01/06/2018   Procedure: ESOPHAGOGASTRODUODENOSCOPY (EGD) WITH PROPOFOL;  Surgeon: Irene Shipper, MD;  Location[facility] ENDOSCOPY;  Service: Endoscopy;  Laterality: N/A;  NEED ERCP SCOPE  . HERNIA REPAIR    . LUMBAR LAMINECTOMY  07/2008  . UPPER GASTROINTESTINAL ENDOSCOPY      gastric polyp;Dr.Zaeem Kandel   . VASECTOMY    . WHIPPLE PROCEDURE  2012   partial ; [facility]    Social History Thomas Hudson  reports that he quit smoking about 22 years ago. His smoking use included cigarettes. He has a 50.00 pack-year smoking history. He has never used smokeless tobacco. He reports that he does not drink alcohol and does not use drugs.  family history includes Breast cancer in his sister; COPD in his sister; Colon cancer in his paternal grandfather; Coronary artery disease in his brother; Esophageal cancer in his brother and father; Heart attack in his paternal uncle; Heart disease in his brother, brother, maternal grandfather, and paternal grandfather; Leukemia in his sister; Lung cancer in his sister; Ovarian cancer in his sister; Prostate cancer in his paternal uncle; Skin cancer in his mother; Stroke (age of onset: 37) in his paternal grandmother; Throat cancer in his brother, father, and sister; Uterine cancer in his sister.  Allergies  Allergen Reactions  . Morphine And Related Other (See Comments)    Itching & rash Because of a history of documented adverse serious drug reaction;Medi Alert bracelet  is recommended       PHYSICAL EXAMINATION: Vital signs: BP (!) 166/60   Temp 98 F (36.7 C) (Oral)   Resp 18   Ht 5\' 7"  (1.702 m)   Wt 88.5 kg   SpO2 98%   BMI 30.54 kg/m   Constitutional: generally well-appearing, no acute distress Psychiatric: alert and oriented x3, cooperative Eyes: extraocular movements intact, anicteric, conjunctiva pink Mouth: oral pharynx moist, no lesions Neck: supple no lymphadenopathy Cardiovascular: heart regular rate and rhythm, no  murmur Lungs: clear to auscultation bilaterally Abdomen: soft, nontender, nondistended, no obvious ascites, no peritoneal signs, normal bowel sounds, no organomegaly Rectal: Omitted Extremities: no clubbing, cyanosis, or lower extremity edema bilaterally Skin: no lesions on visible extremities Neuro: No focal deficits.  Cranial nerves intact  ASSESSMENT:  1.  History of recurrent ampullary adenoma with endoscopic  ampullectomy 2018.  Last surveillance examination 2019 with trivial residual adenoma.  Now for follow-up 2.  New complaints of intermittent dysphagia   PLAN:  1.  EGD was standard scope to evaluate dysphagia.The nature of the procedure, as well as the risks, benefits, and alternatives were carefully and thoroughly reviewed with the patient. Ample time for discussion and questions allowed. The patient understood, was satisfied, and agreed to proceed. 2.  After completing EGD, convert ERCP scope to evaluate the ampullary region.  Biopsies or ablation if needed.The nature of the procedure, as well as the risks, benefits, and alternatives were carefully and thoroughly reviewed with the patient. Ample time for discussion and questions allowed. The patient understood, was satisfied, and agreed to proceed.  Docia Chuck. Geri Seminole., M.D. [facility] Division of Gastroenterology

## 2020-01-03 NOTE — Discharge Instructions (Signed)
YOU HAD AN ENDOSCOPIC PROCEDURE TODAY: Refer to the procedure report and other information in the discharge instructions given to you for any specific questions about what was found during the examination. If this information does not answer your questions, please call [place] office at [phone] to clarify.  ° °YOU SHOULD EXPECT: Some feelings of bloating in the abdomen. Passage of more gas than usual. Walking can help get rid of the air that was put into your GI tract during the procedure and reduce the bloating. If you had a lower endoscopy (such as a colonoscopy or flexible sigmoidoscopy) you may notice spotting of blood in your stool or on the toilet paper. Some abdominal soreness may be present for a day or two, also. ° °DIET: Your first meal following the procedure should be a light meal and then it is ok to progress to your normal diet. A half-sandwich or bowl of soup is an example of a good first meal. Heavy or fried foods are harder to digest and may make you feel nauseous or bloated. Drink plenty of fluids but you should avoid alcoholic beverages for 24 hours. If you had a esophageal dilation, please see attached instructions for diet.   ° °ACTIVITY: Your care partner should take you home directly after the procedure. You should plan to take it easy, moving slowly for the rest of the day. You can resume normal activity the day after the procedure however YOU SHOULD NOT DRIVE, use power tools, machinery or perform tasks that involve climbing or major physical exertion for 24 hours (because of the sedation medicines used during the test).  ° °SYMPTOMS TO REPORT IMMEDIATELY: °A gastroenterologist can be reached at any hour. Please call [phone]  for any of the following symptoms:  °Following lower endoscopy (colonoscopy, flexible sigmoidoscopy) °Excessive amounts of blood in the stool  °Significant tenderness, worsening of abdominal pains  °Swelling of the abdomen that is new, acute  °Fever of 100° or  higher  °Following upper endoscopy (EGD, EUS, ERCP, esophageal dilation) °Vomiting of blood or coffee ground material  °New, significant abdominal pain  °New, significant chest pain or pain under the shoulder blades  °Painful or persistently difficult swallowing  °New shortness of breath  °Black, tarry-looking or red, bloody stools ° °FOLLOW UP:  °If any biopsies were taken you will be contacted by phone or by letter within the next 1-3 weeks. Call [phone]  if you have not heard about the biopsies in 3 weeks.  °Please also call with any specific questions about appointments or follow up tests. ° °

## 2020-01-03 NOTE — Transfer of Care (Signed)
Immediate Anesthesia Transfer of Care Note  Patient: Thomas Hudson  Procedure(s) Performed: ESOPHAGOGASTRODUODENOSCOPY (EGD) WITH PROPOFOL (N/A ) BIOPSY  Patient Location: PACU  Anesthesia Type:MAC  Level of Consciousness: sedated  Airway & Oxygen Therapy: Patient Spontanous Breathing and Patient connected to face mask oxygen  Post-op Assessment: Report given to RN and Post -op Vital signs reviewed and stable  Post vital signs: Reviewed and stable  Last Vitals:  Vitals Value Taken Time  BP 93/54 01/03/20 1002  Temp    Pulse 70 01/03/20 1003  Resp 10 01/03/20 1003  SpO2 91 % 01/03/20 1003  Vitals shown include unvalidated device data.  Last Pain:  Vitals:   01/03/20 0853  TempSrc: Oral  PainSc: 4          Complications: No complications documented.

## 2020-01-03 NOTE — Anesthesia Postprocedure Evaluation (Signed)
Anesthesia Post Note  Patient: Thomas Hudson  Procedure(s) Performed: ESOPHAGOGASTRODUODENOSCOPY (EGD) WITH PROPOFOL (N/A ) BIOPSY     Patient location during evaluation: Endoscopy Anesthesia Type: MAC Level of consciousness: awake Pain management: pain level controlled Vital Signs Assessment: post-procedure vital signs reviewed and stable Respiratory status: spontaneous breathing, nonlabored ventilation, respiratory function stable and patient connected to nasal cannula oxygen Cardiovascular status: stable and blood pressure returned to baseline Postop Assessment: no apparent nausea or vomiting Anesthetic complications: no   No complications documented.  Last Vitals:  Vitals:   01/03/20 1010 01/03/20 1020  BP: (!) 108/52 126/64  Pulse: 70 70  Resp: 16 13  Temp:    SpO2: 97% 96%    Last Pain:  Vitals:   01/03/20 1020  TempSrc:   PainSc: 0-No pain                 Ketsia Linebaugh P Aurorah Schlachter

## 2020-01-03 NOTE — Anesthesia Procedure Notes (Signed)
Date/Time: 01/03/2020 9:43 AM Performed by: Talbot Grumbling, CRNA Oxygen Delivery Method: Simple face mask

## 2020-01-03 NOTE — Anesthesia Preprocedure Evaluation (Addendum)
Anesthesia Evaluation  Patient identified by MRN, date of birth, ID band Patient awake    Reviewed: Allergy & Precautions, NPO status , Patient's Chart, lab work & pertinent test results  Airway Mallampati: IV  TM Distance: >3 FB Neck ROM: Full    Dental no notable dental hx.    Pulmonary former smoker,    Pulmonary exam normal breath sounds clear to auscultation       Cardiovascular hypertension, Pt. on medications and Pt. on home beta blockers Normal cardiovascular exam Rhythm:Regular Rate:Normal     Neuro/Psych negative neurological ROS     GI/Hepatic Neg liver ROS, GERD  Medicated and Controlled,  Endo/Other  negative endocrine ROS  Renal/GU negative Renal ROS     Musculoskeletal  (+) Arthritis , Rheumatoid disorders,    Abdominal (+) + obese,   Peds  Hematology HLD   Anesthesia Other Findings F/U benign duodenal tumor  Reproductive/Obstetrics                            Anesthesia Physical Anesthesia Plan  ASA: II  Anesthesia Plan: MAC   Post-op Pain Management:    Induction: Intravenous  PONV Risk Score and Plan: 1 and Propofol infusion and Treatment may vary due to age or medical condition  Airway Management Planned: Nasal Cannula  Additional Equipment:   Intra-op Plan:   Post-operative Plan:   Informed Consent: I have reviewed the patients History and Physical, chart, labs and discussed the procedure including the risks, benefits and alternatives for the proposed anesthesia with the patient or authorized representative who has indicated his/her understanding and acceptance.     Dental advisory given  Plan Discussed with: CRNA  Anesthesia Plan Comments:         Anesthesia Quick Evaluation

## 2020-01-04 ENCOUNTER — Encounter (HOSPITAL_COMMUNITY): Payer: Self-pay | Admitting: Internal Medicine

## 2020-01-04 LAB — SURGICAL PATHOLOGY

## 2020-02-09 DIAGNOSIS — M0600 Rheumatoid arthritis without rheumatoid factor, unspecified site: Secondary | ICD-10-CM | POA: Diagnosis not present

## 2020-02-22 DIAGNOSIS — H2513 Age-related nuclear cataract, bilateral: Secondary | ICD-10-CM | POA: Diagnosis not present

## 2020-03-08 DIAGNOSIS — M0600 Rheumatoid arthritis without rheumatoid factor, unspecified site: Secondary | ICD-10-CM | POA: Diagnosis not present

## 2020-03-14 DIAGNOSIS — R351 Nocturia: Secondary | ICD-10-CM | POA: Diagnosis not present

## 2020-03-14 DIAGNOSIS — N402 Nodular prostate without lower urinary tract symptoms: Secondary | ICD-10-CM | POA: Diagnosis not present

## 2020-03-14 DIAGNOSIS — N401 Enlarged prostate with lower urinary tract symptoms: Secondary | ICD-10-CM | POA: Diagnosis not present

## 2020-03-23 DIAGNOSIS — M150 Primary generalized (osteo)arthritis: Secondary | ICD-10-CM | POA: Diagnosis not present

## 2020-03-23 DIAGNOSIS — M25461 Effusion, right knee: Secondary | ICD-10-CM | POA: Diagnosis not present

## 2020-03-23 DIAGNOSIS — M5136 Other intervertebral disc degeneration, lumbar region: Secondary | ICD-10-CM | POA: Diagnosis not present

## 2020-03-23 DIAGNOSIS — M0600 Rheumatoid arthritis without rheumatoid factor, unspecified site: Secondary | ICD-10-CM | POA: Diagnosis not present

## 2020-03-23 DIAGNOSIS — Z79899 Other long term (current) drug therapy: Secondary | ICD-10-CM | POA: Diagnosis not present

## 2020-03-23 DIAGNOSIS — M1990 Unspecified osteoarthritis, unspecified site: Secondary | ICD-10-CM | POA: Diagnosis not present

## 2020-03-23 DIAGNOSIS — M25569 Pain in unspecified knee: Secondary | ICD-10-CM | POA: Diagnosis not present

## 2020-04-05 DIAGNOSIS — M0600 Rheumatoid arthritis without rheumatoid factor, unspecified site: Secondary | ICD-10-CM | POA: Diagnosis not present

## 2020-04-06 ENCOUNTER — Telehealth: Payer: Self-pay

## 2020-04-06 ENCOUNTER — Other Ambulatory Visit: Payer: Self-pay | Admitting: Primary Care

## 2020-04-06 ENCOUNTER — Telehealth: Payer: Self-pay | Admitting: Internal Medicine

## 2020-04-06 ENCOUNTER — Ambulatory Visit (HOSPITAL_COMMUNITY)
Admission: RE | Admit: 2020-04-06 | Discharge: 2020-04-06 | Disposition: A | Discharge status: Home / Self Care | Payer: Medicare Other | Source: Ambulatory Visit | Attending: Pulmonary Disease | Admitting: Pulmonary Disease

## 2020-04-06 DIAGNOSIS — Z23 Encounter for immunization: Secondary | ICD-10-CM | POA: Insufficient documentation

## 2020-04-06 DIAGNOSIS — R0989 Other specified symptoms and signs involving the circulatory and respiratory systems: Secondary | ICD-10-CM | POA: Diagnosis not present

## 2020-04-06 DIAGNOSIS — R0981 Nasal congestion: Secondary | ICD-10-CM | POA: Diagnosis not present

## 2020-04-06 DIAGNOSIS — R05 Cough: Secondary | ICD-10-CM | POA: Diagnosis not present

## 2020-04-06 DIAGNOSIS — U071 COVID-19: Secondary | ICD-10-CM | POA: Insufficient documentation

## 2020-04-06 MED ORDER — DIPHENHYDRAMINE HCL 50 MG/ML IJ SOLN: 50.0000 mg | Freq: Once | INTRAMUSCULAR | Status: DC | PRN

## 2020-04-06 MED ORDER — SODIUM CHLORIDE 0.9 % IV SOLN
1200.0000 mg | Freq: Once | INTRAVENOUS | Status: AC
  Administered 2020-04-06: 1200 mg via INTRAVENOUS

## 2020-04-06 MED ORDER — FAMOTIDINE IN NACL 20-0.9 MG/50ML-% IV SOLN: 20.0000 mg | Freq: Once | INTRAVENOUS | Status: DC | PRN

## 2020-04-06 MED ORDER — METHYLPREDNISOLONE SODIUM SUCC 125 MG IJ SOLR: 125.0000 mg | Freq: Once | INTRAMUSCULAR | Status: DC | PRN

## 2020-04-06 MED ORDER — ALBUTEROL SULFATE HFA 108 (90 BASE) MCG/ACT IN AERS: 2.0000 | INHALATION_SPRAY | Freq: Once | RESPIRATORY_TRACT | Status: DC | PRN

## 2020-04-06 MED ORDER — EPINEPHRINE 0.3 MG/0.3ML IJ SOAJ: 0.3000 mg | Freq: Once | INTRAMUSCULAR | Status: DC | PRN

## 2020-04-06 MED ORDER — SODIUM CHLORIDE 0.9 % IV SOLN: INTRAVENOUS | Status: DC | PRN

## 2020-04-06 NOTE — Telephone Encounter (Signed)
Great---thanks! I am impressed with the efficiency of this system!

## 2020-04-06 NOTE — Telephone Encounter (Signed)
Patient qualifies for monoclonal antibody treatment based off of age and comorbidities. He is scheduled for today at 5:30 PM, instructions provided, he will bring a copy of his results.

## 2020-04-06 NOTE — Telephone Encounter (Signed)
Please let him know we will try to get him in ASAP and hopefully he will hear in the next 1-2 days

## 2020-04-06 NOTE — Telephone Encounter (Signed)
Sent staff msg to Allie Bossier, NP to contact pt to schedule for infusion.

## 2020-04-06 NOTE — Telephone Encounter (Signed)
-----   Message from [name], MD sent at 04/06/2020 12:30 PM EDT ----- Please try to get him in for monoclonal antibody. Message is not completely clear but it seems his symptoms started 7 days ago  Thanks, Writer

## 2020-04-06 NOTE — Discharge Instructions (Signed)

## 2020-04-06 NOTE — Progress Notes (Signed)
I connected by phone with Thomas Hudson on 04/06/2020 at 1:18 PM to discuss the potential use of a new treatment for mild to moderate COVID-19 viral infection in non-hospitalized patients.  This patient is a 79 y.o. male that meets the FDA criteria for Emergency Use Authorization of COVID monoclonal antibody casirivimab/imdevimab or bamlanivimab/eteseviamb.  Has a (+) direct SARS-CoV-2 viral test result  Has mild or moderate COVID-19   Is NOT hospitalized due to COVID-19  Is within 10 days of symptom onset  Has at least one of the high risk factor(s) for progression to severe COVID-19 and/or hospitalization as defined in EUA.  Specific high risk criteria : Older age (>/= 77 yo) and Cardiovascular disease or hypertension   I have spoken and communicated the following to the patient or parent/caregiver regarding COVID monoclonal antibody treatment:  1. FDA has authorized the emergency use for the treatment of mild to moderate COVID-19 in adults and pediatric patients with positive results of direct SARS-CoV-2 viral testing who are 40 years of age and older weighing at least 40 kg, and who are at high risk for progressing to severe COVID-19 and/or hospitalization.  2. The significant known and potential risks and benefits of COVID monoclonal antibody, and the extent to which such potential risks and benefits are unknown.  3. Information on available alternative treatments and the risks and benefits of those alternatives, including clinical trials.  4. Patients treated with COVID monoclonal antibody should continue to self-isolate and use infection control measures (e.g., wear mask, isolate, social distance, avoid sharing personal items, clean and disinfect high touch surfaces, and frequent handwashing) according to CDC guidelines.   5. The patient or parent/caregiver has the option to accept or refuse COVID monoclonal antibody treatment.  After reviewing this information with the patient,  the patient has agreed to receive one of the available covid 19 monoclonal antibodies and will be provided an appropriate fact sheet prior to infusion. Pleas Koch, NP 04/06/2020 1:18 PM

## 2020-04-06 NOTE — Telephone Encounter (Signed)
Pt called stating he went [facility] on [address] this morning  He tested positive for covid He wanted to know if you could get him in  for infussion   Pt has bad cough   This has been going on for a week No fever Sob Difficulty breathing

## 2020-04-06 NOTE — Progress Notes (Signed)
  Diagnosis: COVID-19  Physician: Dr. Joya Gaskins  Procedure: [facility]fusion [facility] Med: casirivimab\imdevimab infusion - Provided patient with casirivimab\imdevimab fact sheet for patients, parents and caregivers prior to infusion.  Complications: No immediate complications noted.  Discharge: Discharged home   Rejeana Brock 04/06/2020

## 2020-04-07 NOTE — Telephone Encounter (Signed)
-----   Message from [name], MD sent at 04/06/2020 12:30 PM EDT ----- Please try to get him in for monoclonal antibody. Message is not completely clear but it seems his symptoms started 7 days ago  Thanks, Writer

## 2020-04-09 ENCOUNTER — Other Ambulatory Visit (HOSPITAL_COMMUNITY): Payer: Self-pay

## 2020-05-08 DIAGNOSIS — M0600 Rheumatoid arthritis without rheumatoid factor, unspecified site: Secondary | ICD-10-CM | POA: Diagnosis not present

## 2020-05-12 ENCOUNTER — Ambulatory Visit (INDEPENDENT_AMBULATORY_CARE_PROVIDER_SITE_OTHER): Payer: Medicare Other | Admitting: Internal Medicine

## 2020-05-12 ENCOUNTER — Other Ambulatory Visit: Payer: Self-pay

## 2020-05-12 ENCOUNTER — Encounter: Payer: Self-pay | Admitting: Internal Medicine

## 2020-05-12 VITALS — BP 130/66 | HR 90 | Temp 98.1°F | Ht 66.5 in | Wt 199.0 lb

## 2020-05-12 DIAGNOSIS — K219 Gastro-esophageal reflux disease without esophagitis: Secondary | ICD-10-CM

## 2020-05-12 DIAGNOSIS — I208 Other forms of angina pectoris: Secondary | ICD-10-CM

## 2020-05-12 DIAGNOSIS — M06049 Rheumatoid arthritis without rheumatoid factor, unspecified hand: Secondary | ICD-10-CM

## 2020-05-12 DIAGNOSIS — Z23 Encounter for immunization: Secondary | ICD-10-CM

## 2020-05-12 DIAGNOSIS — Z7189 Other specified counseling: Secondary | ICD-10-CM | POA: Diagnosis not present

## 2020-05-12 DIAGNOSIS — Z0000 Encounter for general adult medical examination without abnormal findings: Secondary | ICD-10-CM | POA: Diagnosis not present

## 2020-05-12 DIAGNOSIS — J42 Unspecified chronic bronchitis: Secondary | ICD-10-CM | POA: Diagnosis not present

## 2020-05-12 DIAGNOSIS — N400 Enlarged prostate without lower urinary tract symptoms: Secondary | ICD-10-CM

## 2020-05-12 DIAGNOSIS — I10 Essential (primary) hypertension: Secondary | ICD-10-CM

## 2020-05-12 NOTE — Addendum Note (Signed)
Addended by: Pilar Grammes on: 05/12/2020 11:53 AM   Modules accepted: Orders

## 2020-05-12 NOTE — Assessment & Plan Note (Signed)
I have personally reviewed the Medicare Annual Wellness questionnaire and have noted 1. The patient's medical and social history 2. Their use of alcohol, tobacco or illicit drugs 3. Their current medications and supplements 4. The patient's functional ability including ADL's, fall risks, home safety risks and hearing or visual             impairment. 5. Diet and physical activities 6. Evidence for depression or mood disorders  The patients weight, height, BMI and visual acuity have been recorded in the chart I have made referrals, counseling and provided education to the patient based review of the above and I have provided the pt with a written personalized care plan for preventive services.  I have provided you with a copy of your personalized plan for preventive services. Please take the time to review along with your updated medication list.  Flu vaccine today No more cancer screening Discussed exercise COVID booster next month (3 months after MAB Rx)

## 2020-05-12 NOTE — Assessment & Plan Note (Signed)
Okay on omeprazole

## 2020-05-12 NOTE — Assessment & Plan Note (Signed)
Doing okay on dual therapy with finasteride and tamsulosin

## 2020-05-12 NOTE — Assessment & Plan Note (Signed)
Keeps up with rheumatology---cimzia and low dose prednisone

## 2020-05-12 NOTE — Assessment & Plan Note (Signed)
Now with worsening DOE He is ready for Rx--will set up with pulmonary

## 2020-05-12 NOTE — Assessment & Plan Note (Signed)
See social history 

## 2020-05-12 NOTE — Progress Notes (Signed)
Subjective:    Patient ID: Thomas Hudson, male    DOB: [birth year], 79 y.o.   MRN: [id number]  HPI Here for Medicare wellness visit and follow up of chronic health conditions This visit occurred during the SARS-CoV-2 public health emergency.  Safety protocols were in place, including screening questions prior to the visit, additional usage of staff PPE, and extensive cleaning of exam room while observing appropriate contact time as indicated for disinfecting solutions.   Reviewed advanced directives Reviewed other doctors---Dr Syed--rheumatology, Dr Kathrine Cords, Dr Nahser--cardiology, Dr Christophe Louis, [facility]---derm, Dr Eskridge--urology, Dr Bell--dentist, Dr Dingledein--- opththal No hospitalizations or surgery in the past year Vision is okay Hearing is not good. Not ready for aides No depression or anhedonia No falls He does most of the instrumental ADLs No memory issues for him Tries to walk a mile daily--that is the most he can do  Has recovered from the [place] the infusion really helped him--better within a day Continues with chronic productive cough Ready to see pulmonary about this  Had bulging disc in back Saw Dr Alveta Heimlich put on gabapentin  Did see cardiologist No sig concerns No chest pain Does have fairly stable DOE---with any exertion Gets palpitations (racing) at times No dizziness or syncope Some edema---not bad  Still on cimzia and low dose prednisone Does get bouts of pain with this Needed fluid drawn off right knee not long ago  Keeps up with urology Still on dual therapy for BPH Voids okay on that Nocturia x 1-2  Stays on omeprazole Did have EGD this year  Current Outpatient Medications on File Prior to Visit  Medication Sig Dispense Refill  . alfuzosin (UROXATRAL) 10 MG 24 hr tablet Take 1 tablet (10 mg total) by mouth daily. TAKE 1 TABLET BY MOUTH DAILY (Patient taking differently: Take 10 mg by mouth daily. ) 3 tablet 0  .  amLODipine (NORVASC) 5 MG tablet Take 1 tablet (5 mg total) by mouth daily. 3 tablet 0  . aspirin-acetaminophen-caffeine (EXCEDRIN MIGRAINE) 250-250-65 MG tablet Take 2 tablets by mouth 3 (three) times daily as needed for headache.    . Certolizumab Pegol (CIMZIA) 2 X 200 MG KIT Inject 2 each into the skin every 30 (thirty) days.    . cetirizine (ZYRTEC) 10 MG tablet Take 1 tablet (10 mg total) by mouth at bedtime. 90 tablet 3  . erythromycin ophthalmic ointment Place 1 application into both eyes daily as needed (dry eyes).    . ferrous sulfate 325 (65 FE) MG tablet Take 1 tablet (325 mg total) by mouth 3 (three) times daily with meals. 270 tablet 3  . finasteride (PROSCAR) 5 MG tablet Take 1 tablet (5 mg total) by mouth daily. 3 tablet 0  . fluticasone (FLONASE) 50 MCG/ACT nasal spray Place 2 sprays into both nostrils daily as needed for allergies. 48 g 3  . folic acid (FOLVITE) 1 MG tablet Take 1 mg by mouth daily.   3  . gabapentin (NEURONTIN) 300 MG capsule Take 300-600 mg by mouth See admin instructions. Take 1 capsule (300 mg) by mouth in the morning & take 2 capsules (600 mg) by mouth at night.    . losartan (COZAAR) 100 MG tablet Take 1 tablet (100 mg total) by mouth daily. 3 tablet 0  . montelukast (SINGULAIR) 10 MG tablet Take 1 tablet (10 mg total) by mouth at bedtime. (Patient taking differently: Take 10 mg by mouth daily. ) 90 tablet 3  . Multiple Vitamin (MULTIVITAMIN WITH  MINERALS) TABS tablet Take 1 tablet by mouth daily.    Marland Kitchen omeprazole (PRILOSEC) 20 MG capsule Take 1 capsule (20 mg total) by mouth daily. 90 capsule 3  . potassium chloride SA (KLOR-CON) 20 MEQ tablet Take 1 tablet (20 mEq total) by mouth daily. 90 tablet 3  . predniSONE (DELTASONE) 5 MG tablet Take 5 mg by mouth See admin instructions. Take 1 tablet daily    . rosuvastatin (CRESTOR) 10 MG tablet Take 1 tablet (10 mg total) by mouth daily. 90 tablet 3   No current facility-administered medications on file prior to  visit.    Allergies  Allergen Reactions  . Morphine And Related Other (See Comments)    Itching & rash Because of a history of documented adverse serious drug reaction;Medi Alert bracelet  is recommended    Past Medical History:  Diagnosis Date  . BPH (benign prostatic hyperplasia)   . Carpal tunnel syndrome on both sides   . GERD (gastroesophageal reflux disease)   . Hyperlipidemia   . Hypertension   . RAD (reactive airway disease)    no PMH of asthma; RAD post infection  . Rheumatoid arthritis [facility])         Past Surgical History:  Procedure Laterality Date  . BIOPSY  01/06/2018   Procedure: BIOPSY;  Surgeon: Irene Shipper, MD;  Location: [facility];  Service: Endoscopy;;  . BIOPSY  01/03/2020   Procedure: BIOPSY;  Surgeon: Irene Shipper, MD;  Location: [facility] END[facility]COPY;  Service: Endoscopy;;  . COLONOSCOPY W/ POLYPECTOMY  2009   X 2; Dr.Perry   . ERCP N/A 11/05/2016   Procedure: ENDOSCOPIC RETROGRADE CHOLANGIOPANCREATOGRAPHY (ERCP);  Surgeon: Irene Shipper, MD;  Location: [facility] ENDOSCOPY;  Service: Endoscopy;  Laterality: N/A;  . ESOPHAGOGASTRODUODENOSCOPY (EGD) WITH PROPOFOL N/A 07/29/2016   Procedure: ESOPHAGOGASTRODUODENOSCOPY (EGD) WITH PROPOFOL;  Surgeon: Irene Shipper, MD;  Location: [facility] ENDOSCOPY;  Service: Endoscopy;  Laterality: N/A;  will need ERCP scope  . ESOPHAGOGASTRODUODENOSCOPY (EGD) WITH PROPOFOL N/A 01/06/2018   Procedure: ESOPHAGOGASTRODUODENOSCOPY (EGD) WITH PROPOFOL;  Surgeon: Irene Shipper, MD;  Location: [facility] ENDOSCOPY;  Service: Endoscopy;  Laterality: N/A;  NEED ERCP SCOPE  . ESOPHAGOGASTRODUODENOSCOPY (EGD) WITH PROPOFOL N/A 01/03/2020   Procedure: ESOPHAGOGASTRODUODENOSCOPY (EGD) WITH PROPOFOL;  Surgeon: Irene Shipper, MD;  Location: [facility] ENDOSCOPY;  Service: Endoscopy;  Laterality: N/A;  Needs ERCP scope  . HERNIA REPAIR    . LUMBAR LAMINECTOMY  07/2008  . UPPER GASTROINTESTINAL ENDOSCOPY      gastric polyp;Dr.Perry   . VASECTOMY    . WHIPPLE PROCEDURE  2012    partial ; [facility]    Family History  Problem Relation Age of Onset  . Throat cancer Father        smoker  . Esophageal cancer Father   . Prostate cancer Paternal Uncle   . Heart attack Paternal Uncle        3 uncles, >55  . Coronary artery disease Brother        in 4s  . Esophageal cancer Brother   . Skin cancer Mother   . Throat cancer Brother   . Heart disease Brother   . Uterine cancer Sister   . COPD Sister        X41  . Ovarian cancer Sister   . Stroke Paternal Grandmother 70  . Colon cancer Paternal Grandfather   . Heart disease Paternal Grandfather   . Heart disease Maternal Grandfather   . Throat cancer Sister  smoker  . Breast cancer Sister   . Lung cancer Sister        smoker  . Leukemia Sister   . Heart disease Brother   . Asthma Neg Hx     Social History   Socioeconomic History  . Marital status: Married    Spouse name: Not on file  . Number of children: 2  . Years of education: Not on file  . Highest education level: Not on file  Occupational History  . 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 Smoking status: Former Smoker    Packs/day: 1.00    Years: 50.00    Pack years: 50.00    Types: Cigarettes    Quit date: 07/08/1997    Years since quitting: 22.8  . Smokeless tobacco: Never Used  . Tobacco comment: smoked age [age], up to 1 ppd  Substance and Sexual Activity  . Alcohol use: No    Alcohol/week: 0.0 standard drinks  . Drug use: No  . Sexual activity: Yes  Other Topics Concern  . Not on file  Social History Narrative   Has living will   Requests daughters as health care POA   Would accept resuscitation attempts   Not sure about tube feeds   Social Determinants of Health   Financial Resource Strain:   . Difficulty of Paying Living Expenses: Not on file  Food Insecurity:   . Worried About Charity fundraiser in the Last Year: Not on file  . Ran Out of Food in the Last Year: Not on file    Transportation Needs:   . Lack of Transportation (Medical): Not on file  . Lack of Transportation (Non-Medical): Not on file  Physical Activity:   . Days of Exercise per Week: Not on file  . Minutes of Exercise per Session: Not on file  Stress:   . Feeling of Stress : Not on file  Social Connections:   . Frequency of Communication with Friends and Family: Not on file  . Frequency of Social Gatherings with Friends and Family: Not on file  . Attends Religious Services: Not on file  . Active Member of Clubs or Organizations: Not on file  . Attends Archivist Meetings: Not on file  . Marital Status: Not on file  Intimate Partner Violence:   . Fear of Current or Ex-Partner: Not on file  . Emotionally Abused: Not on file  . Physically Abused: Not on file  . Sexually Abused: Not on file   Review of Systems Appetite is okay Weight is going up slightly Not sleeping as well---with wife diagnosed with Alzheimer's Wears seat belt Rash on scalp---?acne. Bowels move fine--no blood. Dark due to iron Had dental implant--but it failed and had to be removed     Objective:   Physical Exam Constitutional:      Appearance: Normal appearance.  HENT:     Mouth/Throat:     Comments: No lesions Eyes:     Conjunctiva/sclera: Conjunctivae normal.     Pupils: Pupils are equal, round, and reactive to light.  Cardiovascular:     Rate and Rhythm: Normal rate and regular rhythm.     Pulses: Normal pulses.     Heart sounds: No murmur heard.  No gallop.   Pulmonary:     Breath sounds: Normal breath sounds. No wheezing or rales.     Comments: Gets SOB just taking shirts off Abdominal:  Palpations: Abdomen is soft.     Tenderness: There is no abdominal tenderness.  Musculoskeletal:     Cervical back: Neck supple.     Right lower leg: No edema.     Left lower leg: No edema.  Lymphadenopathy:     Cervical: No cervical adenopathy.  Skin:    General: Skin is warm.     Findings: No  rash.  Neurological:     Mental Status: He is alert and oriented to person, place, and time.     Comments: President--- "Zoila Shutter, Obama" Not good with numbers D-l-r-[id number]-w Recall 3/3  Psychiatric:        Mood and Affect: Mood normal.        Behavior: Behavior normal.            Assessment & Plan:

## 2020-05-12 NOTE — Assessment & Plan Note (Signed)
BP Readings from Last 3 Encounters:  05/12/20 130/66  04/06/20 137/66  01/03/20 126/64   Good control on losartan

## 2020-05-31 ENCOUNTER — Other Ambulatory Visit: Payer: Self-pay

## 2020-05-31 ENCOUNTER — Encounter: Payer: Self-pay | Admitting: Internal Medicine

## 2020-05-31 ENCOUNTER — Ambulatory Visit (INDEPENDENT_AMBULATORY_CARE_PROVIDER_SITE_OTHER): Payer: Medicare Other | Admitting: Internal Medicine

## 2020-05-31 VITALS — BP 142/60 | HR 95 | Temp 97.5°F | Ht 66.5 in | Wt 201.0 lb

## 2020-05-31 DIAGNOSIS — J301 Allergic rhinitis due to pollen: Secondary | ICD-10-CM

## 2020-05-31 DIAGNOSIS — R0600 Dyspnea, unspecified: Secondary | ICD-10-CM | POA: Diagnosis not present

## 2020-05-31 DIAGNOSIS — R0609 Other forms of dyspnea: Secondary | ICD-10-CM

## 2020-05-31 MED ORDER — ALBUTEROL SULFATE HFA 108 (90 BASE) MCG/ACT IN AERS: 2.0000 | INHALATION_SPRAY | Freq: Four times a day (QID) | RESPIRATORY_TRACT | 5 refills | Status: DC | PRN

## 2020-05-31 NOTE — Progress Notes (Signed)
Thomas Hudson    [id number]    [birth year]  Primary Care Physician:Letvak, Theophilus Kinds, MD  Referring Physician: [name], MD [address] [place],   [id number] Reason for Consultation: chronic bronchitis Date of Consultation: 05/31/2020  Chief complaint:   Chief Complaint  Patient presents with  . Consult    shortness of breath all the time, no drop is sats, wheezing, coughing productive Brown-yellow in color     HPI: Thomas Hudson  is a 79 y.o. gentleman with shortness of breath gradually worsening over the last 2 years which is progressing. Dyspnea with exertion not usually with rest. He has wheezing, but no chest tightness of pain. He also coughs daily with mucus production (yellow, brown.) no hemoptysis. Has had pneumonia or bronchitis several times although not in the last ten years. Used to get it every winter when he was a smoker. Several years ago his primary care doctor (now retired,) breathing treatments and inhalers. Had covid 46 in early October and was treated with antibody infusion. Also has rheumatoid arthritis on prednisone 5 mg daily as well as cimzia infections (certolizumab.)  He takes zyrtec and flonase daily for allergies as well as montelukast. Has never had allergy testing.  Takes prilosec for gerd daily.   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  Exposures: lives at home with his wife, no pets.  Smoking history: 100 pack year smoking history  Social History   Occupational History  . 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 Smoking status: Former Smoker    Packs/day: 2.00    Years: 50.00    Pack years: 100.00    Types: Cigarettes    Quit date: 07/08/1997    Years since quitting: 22.9  . Smokeless tobacco: Never Used  Substance and Sexual Activity  . Alcohol use: No    Alcohol/week: 0.0 standard drinks  . Drug use: No  . Sexual activity: Yes     Relevant family history:  Family History  Problem Relation Age of Onset  . Throat cancer Father        smoker  . Esophageal cancer Father   . Prostate cancer Paternal Uncle   . Heart attack Paternal Uncle        3 uncles, >55  . Coronary artery disease Brother        in 68s  . Esophageal cancer Brother   . Skin cancer Mother   . Throat cancer Brother   . Heart disease Brother   . Uterine cancer Sister   . COPD Sister        [id number]  . Ovarian cancer Sister   . Stroke Paternal Grandmother 52  . Colon cancer Paternal Grandfather   . Heart disease Paternal Grandfather   . Heart disease Maternal Grandfather   . Throat cancer Sister        smoker  . Breast cancer Sister   . Lung cancer Sister        smoker  . Leukemia Sister   . Heart disease Brother   . Asthma Neg Hx     Past Medical History:  Diagnosis Date  . BPH (benign prostatic hyperplasia)   . Carpal tunnel syndrome on both sides   . GERD (gastroesophageal reflux disease)   . Hyperlipidemia   . Hypertension   . RAD (reactive airway disease)    no  PMH of asthma; RAD post infection  . Rheumatoid arthritis [facility])         Past Surgical History:  Procedure Laterality Date  . BIOPSY  01/06/2018   Procedure: BIOPSY;  Surgeon: Irene Shipper, MD;  Location: [facility];  Service: Endoscopy;;  . BIOPSY  01/03/2020   Procedure: BIOPSY;  Surgeon: Irene Shipper, MD;  Location: [facility] ENDOSCOPY;  Service: Endoscopy;;  . COLONOSCOPY W/ POLYPECTOMY  2009   X 2; Dr.Perry   . ERCP N/A 11/05/2016   Procedure: ENDOSCOPIC RETROGRADE CHOLANGIOPANCREATOGRAPHY (ERCP);  Surgeon: Irene Shipper, MD;  [facility] ENDOSCOPY;  Service: Endoscopy;  Laterality: N/A;  . ESOPHAGOGASTRODUODENOSCOPY (EGD) WITH PROPOFOL N/A 07/29/2016   Procedure: ESOPHAGOGASTRODUODENOSCOPY (EGD) WITH PROPOFOL;  Surgeon: Irene Shipper, MD;  Location: [facility] ENDOSCOPY;  Service: Endoscopy;  Laterality: N/A;  will need ERCP scope  . ESOPHAGOGASTRODUODENOSCOPY (EGD) WITH  PROPOFOL N/A 01/06/2018   Procedure: ESOPHAGOGASTRODUODENOSCOPY (EGD) WITH PROPOFOL;  Surgeon: Irene Shipper, MD;  Location: [facility] ENDOSCOPY;  Service: Endoscopy;  Laterality: N/A;  NEED ERCP SCOPE  . ESOPHAGOGASTRODUODENOSCOPY (EGD) WITH PROPOFOL N/A 01/03/2020   Procedure: ESOPHAGOGASTRODUODENOSCOPY (EGD) WITH PROPOFOL;  Surgeon: Irene Shipper, MD;  Location: [facility] ENDOSCOPY;  Service: Endoscopy;  Laterality: N/A;  Needs ERCP scope  . HERNIA REPAIR    . LUMBAR LAMINECTOMY  07/2008  . UPPER GASTROINTESTINAL ENDOSCOPY      gastric polyp;Dr.Perry   . VASECTOMY    . WHIPPLE PROCEDURE  2012   partial ; [facility]     Physical Exam: Blood pressure (!) 142/60, pulse 95, temperature (!) 97.5 F (36.4 C), temperature source Temporal, height 5' 6.5" (1.689 m), weight 201 lb (91.2 kg), SpO2 99 %. Gen:      No acute distress ENT:  Mildly edematous and erythematous turbinades, no nasal polyps, mucus membranes moist Lungs:    No increased respiratory effort, symmetric chest wall excursion, clear to auscultation bilaterally, no wheezes or crackles CV:         Regular rate and rhythm; no murmurs, rubs, or gallops.  No pedal edema Abd:      + bowel sounds; soft, non-tender; no distension MSK: no acute synovitis of DIP or PIP joints, no mechanics hands.  Skin:      Warm and dry; no rashes Neuro: normal speech, no focal facial asymmetry Psych: alert and oriented x3, normal mood and affect   Data Reviewed/Medical Decision Making:  Independent interpretation of tests: Imaging: . Review of patient's CT coronary feb 2021 images revealed clear lungs based on limited lung windows. The patient's images have been independently reviewed by me.    PFTs: None on file. Has had spirometry in his pcp's office.   Labs: Lab Results  Component Value Date   WBC 7.0 05/02/2017   HGB 14.1 05/02/2017   HCT 41.6 05/02/2017   MCV 89.1 05/02/2017   PLT 230.0 05/02/2017   Lab Results  Component Value Date   NA 141  08/06/2019   K 4.1 08/06/2019   CL 107 (H) 08/06/2019   CO2 23 08/06/2019    Immunization status:  Immunization History  Administered Date(s) Administered  . Fluad Quad(high Dose 65+) 05/12/2019, 05/12/2020  . Influenza Split 06/25/2011, 04/15/2012  . Influenza Whole 05/04/2009, 06/06/2010  . Influenza, High Dose Seasonal PF 05/03/2013  . Influenza,inj,Quad PF,6+ Mos 04/07/2014, 04/03/2015, 04/19/2016, 05/02/2017  . Influenza-Unspecified 04/21/2018  . PFIZER SARS-COV-2 Vaccination 07/30/2019, 08/20/2019  . Pneumococcal Conjugate-13 04/07/2014  . Pneumococcal Polysaccharide-23 05/04/2009, 05/02/2017  . Td  08/08/2010  . Zoster 08/10/2012    . I reviewed prior external note(s) from PCP . I reviewed the result(s) of the labs and imaging as noted above.  . I have ordered PFT  Assessment:  Shortness of breath, concern for COPD Seasonal and perennial allergic rhinitis  Plan/Recommendations: Will obtain immunocap testing for respiratory allergens Differential diagnosis includes copd, asthma. Will obtain a full set of PFTs. Will start albuterol prn. Inhaler teaching done today.   We discussed disease management and progression at length today.   Return to Care: Return in about 4 weeks (around 06/28/2020).  Lenice Llamas, MD Pulmonary and [place]  CC: [name], MD

## 2020-05-31 NOTE — Patient Instructions (Addendum)
The patient should have follow up scheduled with myself in 1 months.   Prior to next visit patient should have: Full set of PFTs Blood test - ordered  Take nasal saline spray in between your flonase to help with dryness  Take the albuterol rescue inhaler every 4 to 6 hours as needed for wheezing or shortness of breath. You can also take it 15 minutes before exercise or exertional activity. Side effects include heart racing or pounding, jitters or anxiety. If you have a history of an irregular heart rhythm, it can make this worse. Can also give some patients a hard time sleeping.  To inhale the aerosol using an inhaler, follow these steps:  1. Remove the protective dust cap from the end of the mouthpiece. If the dust cap was not placed on the mouthpiece, check the mouthpiece for dirt or other objects. Be sure that the canister is fully and firmly inserted in the mouthpiece. 2. If you are using the inhaler for the first time or if you have not used the inhaler in more than 14 days, you will need to prime it. You may also need to prime the inhaler if it has been dropped. Ask your pharmacist or check the manufacturer's information if this happens. To prime the inhaler, shake it well and then press down on the canister 4 times to release 4 sprays into the air, away from your face. Be careful not to get albuterol in your eyes. 3. Shake the inhaler well. 4. Breathe out as completely as possible through your mouth. 4. Hold the canister with the mouthpiece on the bottom, facing you and the canister pointing upward. Place the open end of the mouthpiece into your mouth. Close your lips tightly around the mouthpiece. 6. Breathe in slowly and deeply through the mouthpiece.At the same time, press down once on the container to spray the medication into your mouth. 7. Try to hold your breath for 10 seconds. remove the inhaler, and breathe out slowly. 8. If you were told to use 2 puffs, wait 1 minute and then  repeat steps 3-7. 9. Replace the protective cap on the inhaler. 10. Clean your inhaler regularly. Follow the manufacturer's directions carefully and ask your doctor or pharmacist if you have any questions about cleaning your inhaler.  Check the back of the inhaler to keep track of the total number of doses left on the inhaler.

## 2020-06-02 LAB — RESPIRATORY ALLERGY PROFILE REGION II ~~LOC~~

## 2020-06-02 LAB — INTERPRETATION:

## 2020-06-08 DIAGNOSIS — M0600 Rheumatoid arthritis without rheumatoid factor, unspecified site: Secondary | ICD-10-CM | POA: Diagnosis not present

## 2020-06-22 ENCOUNTER — Telehealth: Payer: Self-pay | Admitting: Internal Medicine

## 2020-06-22 DIAGNOSIS — M150 Primary generalized (osteo)arthritis: Secondary | ICD-10-CM | POA: Diagnosis not present

## 2020-06-22 DIAGNOSIS — J302 Other seasonal allergic rhinitis: Secondary | ICD-10-CM

## 2020-06-22 DIAGNOSIS — M549 Dorsalgia, unspecified: Secondary | ICD-10-CM | POA: Diagnosis not present

## 2020-06-22 DIAGNOSIS — M0600 Rheumatoid arthritis without rheumatoid factor, unspecified site: Secondary | ICD-10-CM | POA: Diagnosis not present

## 2020-06-22 DIAGNOSIS — K219 Gastro-esophageal reflux disease without esophagitis: Secondary | ICD-10-CM

## 2020-06-22 DIAGNOSIS — M5136 Other intervertebral disc degeneration, lumbar region: Secondary | ICD-10-CM | POA: Diagnosis not present

## 2020-06-22 DIAGNOSIS — I10 Essential (primary) hypertension: Secondary | ICD-10-CM

## 2020-06-22 DIAGNOSIS — Z79899 Other long term (current) drug therapy: Secondary | ICD-10-CM | POA: Diagnosis not present

## 2020-06-22 DIAGNOSIS — M1990 Unspecified osteoarthritis, unspecified site: Secondary | ICD-10-CM | POA: Diagnosis not present

## 2020-06-22 MED ORDER — LOSARTAN POTASSIUM 100 MG PO TABS: 100.0000 mg | ORAL_TABLET | Freq: Every day | ORAL | 3 refills | Status: DC

## 2020-06-22 MED ORDER — FLUTICASONE PROPIONATE 50 MCG/ACT NA SUSP: 2.0000 | Freq: Every day | NASAL | 3 refills | Status: DC | PRN

## 2020-06-22 MED ORDER — OMEPRAZOLE 20 MG PO CPDR: 20.0000 mg | DELAYED_RELEASE_CAPSULE | Freq: Every day | ORAL | 3 refills | Status: DC

## 2020-06-22 MED ORDER — MONTELUKAST SODIUM 10 MG PO TABS
10.0000 mg | ORAL_TABLET | Freq: Every day | ORAL | 3 refills | Status: DC
Start: 2020-06-22 — End: 2021-07-05

## 2020-06-22 MED ORDER — ALFUZOSIN HCL ER 10 MG PO TB24
10.0000 mg | ORAL_TABLET | Freq: Every day | ORAL | 3 refills | Status: DC
Start: 2020-06-22 — End: 2020-12-29

## 2020-06-22 MED ORDER — CETIRIZINE HCL 10 MG PO TABS: 10.0000 mg | ORAL_TABLET | Freq: Every day | ORAL | 3 refills | Status: DC

## 2020-06-22 MED ORDER — AMLODIPINE BESYLATE 5 MG PO TABS
5.0000 mg | ORAL_TABLET | Freq: Every day | ORAL | 3 refills | Status: DC
Start: 2020-06-22 — End: 2021-07-05

## 2020-06-22 MED ORDER — FINASTERIDE 5 MG PO TABS: 5.0000 mg | ORAL_TABLET | Freq: Every day | ORAL | 3 refills | Status: DC

## 2020-06-22 MED ORDER — FERROUS SULFATE 325 (65 FE) MG PO TABS: 325.0000 mg | ORAL_TABLET | Freq: Three times a day (TID) | ORAL | 3 refills | Status: DC

## 2020-06-22 NOTE — Telephone Encounter (Signed)
Pt called in he is missing 2 prescriptions off the list 1) potassium  sa 20mg  2) flonase(genetric brand)

## 2020-06-22 NOTE — Telephone Encounter (Addendum)
Patient came into office and stated he is needing to have the medications with the asteric to be sent in to [facility]. Stated it is supposed to be sent for a 3 month supply with 3 refills. Placed on cart. Please call when done.

## 2020-06-22 NOTE — Telephone Encounter (Signed)
Medications sent to [facility]. Message sent on MyChart.

## 2020-06-22 NOTE — Addendum Note (Signed)
Addended by: Pilar Grammes on: 06/22/2020 11:08 AM   Modules accepted: Orders

## 2020-06-23 NOTE — Telephone Encounter (Signed)
Left message to call office. Flonase is on his med list and it was sent to [facility]. Left message to see if he is needing a refill of the potassium, also. It is on his med list.

## 2020-06-26 ENCOUNTER — Other Ambulatory Visit: Payer: Self-pay

## 2020-06-26 MED ORDER — POTASSIUM CHLORIDE CRYS ER 20 MEQ PO TBCR
20.0000 meq | EXTENDED_RELEASE_TABLET | Freq: Every day | ORAL | 1 refills | Status: DC
Start: 2020-06-26 — End: 2020-12-27

## 2020-06-26 NOTE — Telephone Encounter (Signed)
Called checking on status of refills. Advised sent. Expressed understanding.

## 2020-06-26 NOTE — Progress Notes (Signed)
[place] Night - Client TELEPHONE ADVICE RECORD [facility] Patient Name: Thomas Hudson Gender: Male DOB: Jul 02, 1941 Age: 79 Y 37 M 16 D Return Phone Number: [phone] (Primary), [phone] (Secondary) Address: City/State/Zip: [place] [address] Client [facility] Night - Client Client Site [place] Physician Viviana Simpler- MD Contact Type Call Who Is Calling Patient / Member / Family / Caregiver Call Type Triage / Clinical Relationship To Patient Self Return Phone Number [phone] (Secondary) Chief Complaint Prescription Refill or Medication Request (non symptomatic) Reason for Call Medication Question / Request Initial Comment Caller states he needs two more scripts called in to the pharmacy from Dr. Alla German office. Fluticasone 1mcg and Potassium CL SA 20mg  were not on list. Caller states there were 9 medications on a list and these two were left off of the list. Fill at [facility] - caller will give nurse the phone number. Translation No Nurse Assessment Nurse: Caryn Section, RN, [name] Date/Time (Eastern Time): 06/24/2020 8:29:41 PM Confirm and document reason for call. If symptomatic, describe symptoms. ---Caller states that he is needing two more RX sent to pharmacy. No new or worsening symptoms. Does the patient have any new or worsening symptoms? ---No Nurse: Caryn Section, RN, [name] Date/Time (Eastern Time): 06/24/2020 8:31:07 PM Please select the assessment type ---Refill Does the patient have enough medication to last until the office opens? ---No Additional Documentation ---Pt advised that the office does not allow for RX refills after hours. Instructed to call office on Monday AM for assistance. Pt verbalized understanding. Disp. Time Eilene Ghazi Time) Disposition Final User 06/24/2020 7:58:10 PM Attempt made - message left Louie Boston, RNBarnetta Chapel 06/24/2020  7:59:26 PM Attempt made - message left Louie Boston, RNBarnetta Chapel 06/24/2020 8:18:27 PM Send To RN Personal Louie Boston, RN, Catherine 06/24/2020 8:32:25 PM Clinical Call Yes Caryn Section, RN, [name]  Fluticasone 50 mcg/act nasal spray was refilled on 06/22/2020. However, prescription refill sent for Potassium Chloride SA 20 mEq to [facility].

## 2020-07-06 DIAGNOSIS — M0600 Rheumatoid arthritis without rheumatoid factor, unspecified site: Secondary | ICD-10-CM | POA: Diagnosis not present

## 2020-07-06 DIAGNOSIS — M069 Rheumatoid arthritis, unspecified: Secondary | ICD-10-CM | POA: Diagnosis not present

## 2020-07-19 ENCOUNTER — Ambulatory Visit: Payer: TRICARE For Life (TFL) | Admitting: Internal Medicine

## 2020-07-28 ENCOUNTER — Other Ambulatory Visit (HOSPITAL_COMMUNITY)
Admission: RE | Admit: 2020-07-28 | Discharge: 2020-07-28 | Disposition: A | Discharge status: Home / Self Care | Payer: Medicare Other | Source: Ambulatory Visit | Attending: Internal Medicine | Admitting: Internal Medicine

## 2020-07-28 DIAGNOSIS — Z20822 Contact with and (suspected) exposure to covid-19: Secondary | ICD-10-CM | POA: Diagnosis not present

## 2020-07-28 LAB — SARS CORONAVIRUS 2 (TAT 6-24 HRS): SARS Coronavirus 2: NEGATIVE

## 2020-07-31 ENCOUNTER — Encounter: Payer: Self-pay | Admitting: Internal Medicine

## 2020-07-31 ENCOUNTER — Ambulatory Visit (INDEPENDENT_AMBULATORY_CARE_PROVIDER_SITE_OTHER): Payer: Medicare Other | Admitting: Internal Medicine

## 2020-07-31 ENCOUNTER — Other Ambulatory Visit: Payer: Self-pay

## 2020-07-31 VITALS — BP 118/64 | HR 76 | Ht 67.0 in | Wt 204.0 lb

## 2020-07-31 DIAGNOSIS — R0600 Dyspnea, unspecified: Secondary | ICD-10-CM

## 2020-07-31 DIAGNOSIS — R0609 Other forms of dyspnea: Secondary | ICD-10-CM

## 2020-07-31 DIAGNOSIS — Z87891 Personal history of nicotine dependence: Secondary | ICD-10-CM | POA: Diagnosis not present

## 2020-07-31 DIAGNOSIS — J310 Chronic rhinitis: Secondary | ICD-10-CM | POA: Diagnosis not present

## 2020-07-31 LAB — PULMONARY FUNCTION TEST
DL/VA % pred: 123 %
DL/VA: 4.92 ml/min/mmHg/L
DLCO cor % pred: 74 %
DLCO cor: 16.91 ml/min/mmHg
DLCO unc % pred: 74 %
DLCO unc: 16.91 ml/min/mmHg
FEF 25-75 Post: 2.54 L/sec
FEF 25-75 Pre: 2.53 L/sec
FEF2575-%Change-Post: 0 %
FEF2575-%Pred-Post: 136 %
FEF2575-%Pred-Pre: 136 %
FEV1-%Change-Post: 0 %
FEV1-%Pred-Post: 85 %
FEV1-%Pred-Pre: 85 %
FEV1-Post: 2.24 L
FEV1-Pre: 2.24 L
FEV1FVC-%Change-Post: 5 %
FEV1FVC-%Pred-Pre: 114 %
FEV6-%Change-Post: -5 %
FEV6-%Pred-Post: 75 %
FEV6-%Pred-Pre: 79 %
FEV6-Post: 2.56 L
FEV6-Pre: 2.7 L
FEV6FVC-%Pred-Post: 107 %
FEV6FVC-%Pred-Pre: 107 %
FVC-%Change-Post: -5 %
FVC-%Pred-Post: 69 %
FVC-%Pred-Pre: 73 %
FVC-Post: 2.56 L
FVC-Pre: 2.7 L
Post FEV1/FVC ratio: 87 %
Post FEV6/FVC ratio: 100 %
Pre FEV1/FVC ratio: 83 %
Pre FEV6/FVC Ratio: 100 %
RV % pred: 83 %
RV: 2.04 L
TLC % pred: 74 %
TLC: 4.78 L

## 2020-07-31 MED ORDER — ALBUTEROL SULFATE HFA 108 (90 BASE) MCG/ACT IN AERS: 2.0000 | INHALATION_SPRAY | Freq: Four times a day (QID) | RESPIRATORY_TRACT | 3 refills | Status: DC | PRN

## 2020-07-31 NOTE — Progress Notes (Signed)
Full PFT completed today ? ?

## 2020-07-31 NOTE — Progress Notes (Signed)
Thomas Hudson    [id number]    [birth year]  Primary Care Physician:Letvak, Theophilus Kinds, MD Date of Appointment: 07/31/2020 Established Patient Visit  Chief complaint:   Chief Complaint  Patient presents with  . Follow-up    PFT performed today. Pt states he is about the same since last visit and still becomes SOB with activities. Pt also complaints of an occ cough with occ mucus production that is gray in color.     HPI: Thomas Hudson is a 80 y.o. gentleman with history of allergic rhinitis who presented Nov 2021 with shortness of breath and was started on albuterol prn.   Interval Updates: Here for follow up today after PFTs and trial of albuterol. He never picked up albuterol because it was so expensive - cost him $115. Still sob with exertion. Occasional cough due to rhinitis.   I have reviewed the patient's family social and past medical history and updated as appropriate.   Past Medical History:  Diagnosis Date  . BPH (benign prostatic hyperplasia)   . Carpal tunnel syndrome on both sides   . GERD (gastroesophageal reflux disease)   . Hyperlipidemia   . Hypertension   . RAD (reactive airway disease)    no PMH of asthma; RAD post infection  . Rheumatoid arthritis [facility])         Past Surgical History:  Procedure Laterality Date  . BIOPSY  01/06/2018   Procedure: BIOPSY;  Surgeon: Irene Shipper, MD;  Location: [facility];  Service: Endoscopy;;  . BIOPSY  01/03/2020   Procedure: BIOPSY;  Surgeon: Irene Shipper, MD;  Location: [facility] ENDOSCOPY;  Service: Endoscopy;;  . COLONOSCOPY W/ POLYPECTOMY  2009   X 2; Dr.Perry   . ERCP N/A 11/05/2016   Procedure: ENDOSCOPIC RETROGRADE CHOLANGIOPANCREATOGRAPHY (ERCP);  Surgeon: Irene Shipper, MD;  Location: [facility] ENDOSCOPY;  Service: Endoscopy;  Laterality: N/A;  . ESOPHAGOGASTRODUODENOSCOPY (EGD) WITH PROPOFOL N/A 07/29/2016   Procedure: ESOPHAGOGASTRODUODENOSCOPY (EGD) WITH PROPOFOL;  Surgeon: Irene Shipper, MD;  Location: [facility]  ENDOSCOPY;  Service: Endoscopy;  Laterality: N/A;  will need ERCP scope  . ESOPHAGOGASTRODUODENOSCOPY (EGD) WITH PROPOFOL N/A 01/06/2018   Procedure: ESOPHAGOGASTRODUODENOSCOPY (EGD) WITH PROPOFOL;  Surgeon: Irene Shipper, MD;  Location: [facility] ENDOSCOPY;  Service: Endoscopy;  Laterality: N/A;  NEED ERCP SCOPE  . ESOPHAGOGASTRODUODENOSCOPY (EGD) WITH PROPOFOL N/A 01/03/2020   Procedure: ESOPHAGOGASTRODUODENOSCOPY (EGD) WITH PROPOFOL;  Surgeon: Irene Shipper, MD;  Location: [facility] ENDOSCOPY;  Service: Endoscopy;  Laterality: N/A;  Needs ERCP scope  . HERNIA REPAIR    . LUMBAR LAMINECTOMY  07/2008  . UPPER GASTROINTESTINAL ENDOSCOPY      gastric polyp;Dr.Perry   . VASECTOMY    . WHIPPLE PROCEDURE  2012   partial ; [facility]    Family History  Problem Relation Age of Onset  . Throat cancer Father        smoker  . Esophageal cancer Father   . Prostate cancer Paternal Uncle   . Heart attack Paternal Uncle        3 uncles, >55  . Coronary artery disease Brother        in 7s  . Esophageal cancer Brother   . Skin cancer Mother   . Throat cancer Brother   . Heart disease Brother   . Uterine cancer Sister   . COPD Sister        X29  . Ovarian cancer Sister   . Stroke Paternal Grandmother  88  . Colon cancer Paternal Grandfather   . Heart disease Paternal Grandfather   . Heart disease Maternal Grandfather   . Throat cancer Sister        smoker  . Breast cancer Sister   . Lung cancer Sister        smoker  . Leukemia Sister   . Heart disease Brother   . Asthma Neg Hx     Social History   Occupational History  . 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 Smoking status: Former Smoker    Packs/day: 2.00    Years: 50.00    Pack years: 100.00    Types: Cigarettes    Quit date: 07/08/1997    Years since quitting: 23.0  . Smokeless tobacco: Never Used  Substance and Sexual Activity  . Alcohol use: No    Alcohol/week: 0.0 standard drinks  . Drug use: No   . Sexual activity: Yes     Physical Exam: Blood pressure 118/64, pulse 76, height 5\' 7"  (1.702 m), weight 204 lb (92.5 kg), SpO2 98 %.  Gen:      No acute distress Lungs:    No increased respiratory effort, symmetric chest wall excursion, clear to auscultation bilaterally, no wheezes or crackles CV:         Regular rate and rhythm; no murmurs, rubs, or gallops.  No pedal edema   Data Reviewed: Imaging: I have personally reviewed the CT Chest Cardiac with limited lung windows in Feb 2021 - lung windows available are clear without nodules of effusion.   PFTs: Mild restriction to ventilation   Labs: Lab Results  Component Value Date   WBC 7.0 05/02/2017   HGB 14.1 05/02/2017   HCT 41.6 05/02/2017   MCV 89.1 05/02/2017   PLT 230.0 05/02/2017   Lab Results  Component Value Date   NA 141 08/06/2019   K 4.1 08/06/2019   CL 107 (H) 08/06/2019   CO2 23 08/06/2019     Immunization status: Immunization History  Administered Date(s) Administered  . Fluad Quad(high Dose 65+) 05/12/2019, 05/12/2020  . Influenza Split 06/25/2011, 04/15/2012  . Influenza Whole 05/04/2009, 06/06/2010  . Influenza, High Dose Seasonal PF 05/03/2013  . Influenza,inj,Quad PF,6+ Mos 04/07/2014, 04/03/2015, 04/19/2016, 05/02/2017  . Influenza-Unspecified 05/13/2012, 04/07/2014, 04/21/2018  . PFIZER(Purple Top)SARS-COV-2 Vaccination 07/30/2019, 08/20/2019  . Pneumococcal Conjugate-13 04/07/2014  . Pneumococcal Polysaccharide-23 05/04/2009, 04/07/2014, 05/02/2017  . Td 08/08/2010  . Zoster 08/10/2012    Assessment:  Shortness of breath - suspected COPD Chronic Rhinitis - not well controlled  Plan/Recommendations: Start albuterol for COPD. I have sent albuterol to [place] pharmacy for him.  Continue singulair, flonase, zyrtect for rhinitis. Modified his flonase technique. Can also do nasal saline in between flonase.    Return to Care: Return in about 3 months (around 10/29/2020).   Lenice Llamas, MD Pulmonary and [place]

## 2020-07-31 NOTE — Patient Instructions (Signed)
The patient should have follow up scheduled with myself in 3 months.    Flonase - 1 spray on each side of your nose twice a day for first week, then 1 spray on each side.   Instructions for use:  If you also use a saline nasal spray or rinse, use that first.  Position the head with the chin slightly tucked. Use the right hand to spray into the left nostril and the right hand to spray into the left nostril.   Point the bottle away from the septum of your nose (cartilage that divides the two sides of your nose).   Hold the nostril closed on the opposite side from where you will spray  Spray once and gently sniff to pull the medicine into the higher parts of your nose.  Don't sniff too hard as the medicine will drain down the back of your throat instead.  Repeat with a second spray on the same side if prescribed.  Repeat on the other side of your nose.  Take the albuterol rescue inhaler every 4 to 6 hours as needed for wheezing or shortness of breath. You can also take it 15 minutes before exercise or exertional activity. Side effects include heart racing or pounding, jitters or anxiety. If you have a history of an irregular heart rhythm, it can make this worse. Can also give some patients a hard time sleeping.  To inhale the aerosol using an inhaler, follow these steps:  1. Remove the protective dust cap from the end of the mouthpiece. If the dust cap was not placed on the mouthpiece, check the mouthpiece for dirt or other objects. Be sure that the canister is fully and firmly inserted in the mouthpiece. 2. If you are using the inhaler for the first time or if you have not used the inhaler in more than 14 days, you will need to prime it. You may also need to prime the inhaler if it has been dropped. Ask your pharmacist or check the manufacturer's information if this happens. To prime the inhaler, shake it well and then press down on the canister 4 times to release 4 sprays into the air, away  from your face. Be careful not to get albuterol in your eyes. 3. Shake the inhaler well. 4. Breathe out as completely as possible through your mouth. 4. Hold the canister with the mouthpiece on the bottom, facing you and the canister pointing upward. Place the open end of the mouthpiece into your mouth. Close your lips tightly around the mouthpiece. 6. Breathe in slowly and deeply through the mouthpiece.At the same time, press down once on the container to spray the medication into your mouth. 7. Try to hold your breath for 10 seconds. remove the inhaler, and breathe out slowly. 8. If you were told to use 2 puffs, wait 1 minute and then repeat steps 3-7. 9. Replace the protective cap on the inhaler. 10. Clean your inhaler regularly. Follow the manufacturer's directions carefully and ask your doctor or pharmacist if you have any questions about cleaning your inhaler.  Check the back of the inhaler to keep track of the total number of doses left on the inhaler.

## 2020-08-04 DIAGNOSIS — M069 Rheumatoid arthritis, unspecified: Secondary | ICD-10-CM | POA: Diagnosis not present

## 2020-08-14 DIAGNOSIS — L738 Other specified follicular disorders: Secondary | ICD-10-CM | POA: Diagnosis not present

## 2020-08-14 DIAGNOSIS — Z85828 Personal history of other malignant neoplasm of skin: Secondary | ICD-10-CM | POA: Diagnosis not present

## 2020-08-14 DIAGNOSIS — L814 Other melanin hyperpigmentation: Secondary | ICD-10-CM | POA: Diagnosis not present

## 2020-08-14 DIAGNOSIS — D485 Neoplasm of uncertain behavior of skin: Secondary | ICD-10-CM | POA: Diagnosis not present

## 2020-08-14 DIAGNOSIS — L720 Epidermal cyst: Secondary | ICD-10-CM | POA: Diagnosis not present

## 2020-08-14 DIAGNOSIS — L821 Other seborrheic keratosis: Secondary | ICD-10-CM | POA: Diagnosis not present

## 2020-09-01 DIAGNOSIS — M069 Rheumatoid arthritis, unspecified: Secondary | ICD-10-CM | POA: Diagnosis not present

## 2020-09-25 DIAGNOSIS — M1990 Unspecified osteoarthritis, unspecified site: Secondary | ICD-10-CM | POA: Diagnosis not present

## 2020-09-25 DIAGNOSIS — M069 Rheumatoid arthritis, unspecified: Secondary | ICD-10-CM | POA: Diagnosis not present

## 2020-09-25 DIAGNOSIS — M0600 Rheumatoid arthritis without rheumatoid factor, unspecified site: Secondary | ICD-10-CM | POA: Diagnosis not present

## 2020-09-25 DIAGNOSIS — M25571 Pain in right ankle and joints of right foot: Secondary | ICD-10-CM | POA: Diagnosis not present

## 2020-09-25 DIAGNOSIS — R600 Localized edema: Secondary | ICD-10-CM | POA: Diagnosis not present

## 2020-09-25 DIAGNOSIS — M150 Primary generalized (osteo)arthritis: Secondary | ICD-10-CM | POA: Diagnosis not present

## 2020-09-25 DIAGNOSIS — Z79899 Other long term (current) drug therapy: Secondary | ICD-10-CM | POA: Diagnosis not present

## 2020-09-25 DIAGNOSIS — M5136 Other intervertebral disc degeneration, lumbar region: Secondary | ICD-10-CM | POA: Diagnosis not present

## 2020-09-26 ENCOUNTER — Other Ambulatory Visit: Payer: Self-pay

## 2020-09-26 ENCOUNTER — Telehealth: Payer: Self-pay

## 2020-09-26 DIAGNOSIS — R1310 Dysphagia, unspecified: Secondary | ICD-10-CM

## 2020-09-26 NOTE — Telephone Encounter (Signed)
Order in epic for barium swallow. Rad scheduling should contact pt regarding appt.

## 2020-09-26 NOTE — Telephone Encounter (Signed)
-----   Message from Irene Shipper, MD sent at 09/26/2020 10:35 AM EDT ----- Regarding: Needs barium swallow Vaughan Basta, I saw this patient's wife today.  He is patient of mine.  He was complaining of dysphagia the cervical region.  Items such as bread.  Complained of the same last year when he had his endoscopy with regular and side-viewing scope.  No particular abnormality noted.  I reviewed his chart while he was here. Please order barium swallow with tablet "dysphagia evaluate".  We will go from there.  He will likely need EGD in the [place] with dilation. Please convert this to phone note for future reference.  Thanks Dr. Henrene Pastor

## 2020-09-29 DIAGNOSIS — M0600 Rheumatoid arthritis without rheumatoid factor, unspecified site: Secondary | ICD-10-CM | POA: Diagnosis not present

## 2020-09-29 DIAGNOSIS — M069 Rheumatoid arthritis, unspecified: Secondary | ICD-10-CM | POA: Diagnosis not present

## 2020-10-11 ENCOUNTER — Other Ambulatory Visit: Payer: Self-pay | Admitting: Internal Medicine

## 2020-10-11 ENCOUNTER — Ambulatory Visit (HOSPITAL_COMMUNITY)
Admission: RE | Admit: 2020-10-11 | Discharge: 2020-10-11 | Disposition: A | Discharge status: Home / Self Care | Payer: Medicare Other | Source: Ambulatory Visit | Attending: Internal Medicine | Admitting: Internal Medicine

## 2020-10-11 ENCOUNTER — Other Ambulatory Visit: Payer: Self-pay

## 2020-10-11 DIAGNOSIS — R1310 Dysphagia, unspecified: Secondary | ICD-10-CM

## 2020-10-27 DIAGNOSIS — M069 Rheumatoid arthritis, unspecified: Secondary | ICD-10-CM | POA: Diagnosis not present

## 2020-11-20 DIAGNOSIS — J441 Chronic obstructive pulmonary disease with (acute) exacerbation: Secondary | ICD-10-CM | POA: Diagnosis not present

## 2020-11-20 DIAGNOSIS — Z20822 Contact with and (suspected) exposure to covid-19: Secondary | ICD-10-CM | POA: Diagnosis not present

## 2020-11-21 ENCOUNTER — Encounter: Payer: Self-pay | Admitting: Primary Care

## 2020-11-21 ENCOUNTER — Ambulatory Visit (INDEPENDENT_AMBULATORY_CARE_PROVIDER_SITE_OTHER): Payer: Medicare Other | Admitting: Primary Care

## 2020-11-21 ENCOUNTER — Other Ambulatory Visit: Payer: Self-pay

## 2020-11-21 VITALS — BP 138/68 | HR 89 | Temp 97.6°F | Ht 67.0 in | Wt 209.4 lb

## 2020-11-21 DIAGNOSIS — J209 Acute bronchitis, unspecified: Secondary | ICD-10-CM

## 2020-11-21 DIAGNOSIS — J440 Chronic obstructive pulmonary disease with acute lower respiratory infection: Secondary | ICD-10-CM | POA: Diagnosis not present

## 2020-11-21 DIAGNOSIS — R600 Localized edema: Secondary | ICD-10-CM | POA: Insufficient documentation

## 2020-11-21 DIAGNOSIS — J302 Other seasonal allergic rhinitis: Secondary | ICD-10-CM | POA: Diagnosis not present

## 2020-11-21 DIAGNOSIS — J42 Unspecified chronic bronchitis: Secondary | ICD-10-CM

## 2020-11-21 DIAGNOSIS — R0602 Shortness of breath: Secondary | ICD-10-CM | POA: Diagnosis not present

## 2020-11-21 MED ORDER — CETIRIZINE HCL 10 MG PO TABS: 10.0000 mg | ORAL_TABLET | Freq: Every day | ORAL | 3 refills | Status: DC

## 2020-11-21 MED ORDER — METHYLPREDNISOLONE ACETATE 80 MG/ML IJ SUSP
80.0000 mg | Freq: Once | INTRAMUSCULAR | Status: AC
  Administered 2020-11-21: 80 mg via INTRAMUSCULAR

## 2020-11-21 MED ORDER — PREDNISONE 10 MG PO TABS: ORAL_TABLET | ORAL | 0 refills | Status: DC

## 2020-11-21 NOTE — Assessment & Plan Note (Addendum)
PFTs in January 2022 showed no airflow limitation, mild restriction and normal diffusion capacity but DLCO was not correlated for patients hemoglobin. Symptoms remain consistent with COPD gold 0. He was seen at [facility] urgent care yesterday for bronchitis symptoms. Started on Augmentin and ipratropium-albuterol nebulizer's. He continues to have a cough with moderate amount of audible wheezing. He received depo-medrol 80mg  IM injection today in office and starting prednisone taper. We will get repeat CXR in 3-4 weeks. If symptoms persist may benefit from trial Spiriva respimat 2.39mcg daily. Otherwise would monitor PFTs annually or biannually depending on progression of his symptoms.   Recommendations: - Continue Augmentin as prescribed  - Continue mucinex 600 twice a day  - Resume Zyrtec 10mg  daily - Continue Flonase and Singulair  - Use ipratropium-albuterol 4 times a day for the next week (8am,12pm,4pm,8pm) - Prednisone taper 40mg  x 3 days; 30mg  x 3 days; 20mg  x 3 days; 10mg  x 3 days  Office treatment: - 80mg  depomedrol x1   Follow-up: - 3-4 weeks with either [name] or Eustaquio Maize NP/ come for CXR 30 min prior to visit

## 2020-11-21 NOTE — Patient Instructions (Addendum)
Recommendations: - Continue Augmentin as prescribed  - Continue mucinex 600 twice a day  - Resume Zyrtec 10mg  daily - Continue Flonase and Singulair  - Use ipratropium-albuterol 4 times a day for the next week (8am,12pm,4pm,8pm) - Prednisone taper 40mg  x 3 days; 30mg  x 3 days; 20mg  x 3 days; 10mg  x 3 days  Office treatment: - 80mg  depomedrol x1   Orders: - Labs today  Follow-up: - 3-4 weeks with either [name] or Eustaquio Maize NP/ come for CXR 30 min prior to visit

## 2020-11-21 NOTE — Progress Notes (Signed)
_0  ID: Thomas Hudson, male    DOB: [birth year], 80 y.o.   MRN: [id number]  Chief Complaint  Patient presents with  . Follow-up    Reports wheezing and shortness of breath    Referring provider: [name], MD  HPI: 80 year old male. PMH significant for allergic rhinitis, dyspnea on exertion. Former patient of [name]. He is now following with [name], last seen on 07/31/20.    Previous [facility] pulmonary encounter:  07/31/20 Here for follow up today after PFTs and trial of albuterol. He never picked up albuterol because it was so expensive - cost him $115. Still sob with exertion. Occasional cough due to rhinitis.   I have reviewed the patient's family social and past medical history and updated as appropriate.   Suspected COPD, started on albuterol during last visit. PFTs in January showed no airflow limitation.    11/21/2020- Interim  Patient presents today for acute visit. During last visit he was suspected to have COPD, given albuterol inhaler. PFTs in January 2022 showed no airflow limitation. He was seen at [facility] 11/20/20 for cough and left sided rib pain. Wheezing and rhonchi noted on exam. CXR suggested mild interstitial pulmonary edema. He was prescribed Augmentin and ipratropium-albuterol nebulzier for COPD exacerbation duoneb. He is taking antibiotic as prescribed, feel his cough has slightly improved but still has significant amount of wheezing. He has been using duoneb nebulizer twice a day. He ran out of zyrtec. He is compliant with flonase and Singulair. He is on 65m prednisone a day for arthritis.  *Ok to leave message with lab results   Allergies  Allergen Reactions  . Morphine And Related Other (See Comments)    Itching & rash Because of a history of documented adverse serious drug reaction;Medi Alert bracelet  is recommended    Immunization History  Administered Date(s) Administered  . Fluad Quad(high Dose 65+) 05/12/2019, 05/12/2020  . Influenza Split  06/25/2011, 04/15/2012  . Influenza Whole 05/04/2009, 06/06/2010  . Influenza, High Dose Seasonal PF 05/03/2013  . Influenza,inj,Quad PF,6+ Mos 04/07/2014, 04/03/2015, 04/19/2016, 05/02/2017  . Influenza-Unspecified 05/13/2012, 04/07/2014, 04/21/2018  . PFIZER(Purple Top)SARS-COV-2 Vaccination 07/30/2019, 08/20/2019  . Pneumococcal Conjugate-13 04/07/2014  . Pneumococcal Polysaccharide-23 05/04/2009, 04/07/2014, 05/02/2017  . Td 08/08/2010  . Zoster 08/10/2012    Past Medical History:  Diagnosis Date  . BPH (benign prostatic hyperplasia)   . Carpal tunnel syndrome on both sides   . GERD (gastroesophageal reflux disease)   . Hyperlipidemia   . Hypertension   . RAD (reactive airway disease)    no PMH of asthma; RAD post infection  . Rheumatoid arthritis [place]         Tobacco History: Social History   Tobacco Use  Smoking Status Former Smoker  . Packs/day: 2.00  . Years: 50.00  . Pack years: 100.00  . Types: Cigarettes  . Quit date: 07/08/1997  . Years since quitting: 23.3  Smokeless Tobacco Never Used   Counseling given: Not Answered   Outpatient Medications Prior to Visit  Medication Sig Dispense Refill  . albuterol (VENTOLIN HFA) 108 (90 Base) MCG/ACT inhaler Inhale 2 puffs into the lungs every 6 (six) hours as needed for wheezing or shortness of breath. 3 each 3  . fluticasone (FLONASE) 50 MCG/ACT nasal spray Place 2 sprays into both nostrils daily as needed for allergies. 48 g 3  . montelukast (SINGULAIR) 10 MG tablet Take 1 tablet (10 mg total) by mouth at bedtime. 90 tablet 3  .  predniSONE (DELTASONE) 5 MG tablet Take 5 mg by mouth See admin instructions. Take 1 tablet daily    . alfuzosin (UROXATRAL) 10 MG 24 hr tablet Take 1 tablet (10 mg total) by mouth daily. TAKE 1 TABLET BY MOUTH DAILY 90 tablet 3  . amLODipine (NORVASC) 5 MG tablet Take 1 tablet (5 mg total) by mouth daily. 90 tablet 3  . amoxicillin-clavulanate (AUGMENTIN) 875-125 MG tablet Take 1 tablet by  mouth 2 (two) times daily.    Marland Kitchen aspirin-acetaminophen-caffeine (EXCEDRIN MIGRAINE) 250-250-65 MG tablet Take 2 tablets by mouth 3 (three) times daily as needed for headache.    . Certolizumab Pegol (CIMZIA) 2 X 200 MG KIT Inject 2 each into the skin every 30 (thirty) days.    Marland Kitchen erythromycin ophthalmic ointment Place 1 application into both eyes daily as needed (dry eyes).    . ferrous sulfate 325 (65 FE) MG tablet Take 1 tablet (325 mg total) by mouth 3 (three) times daily with meals. 270 tablet 3  . finasteride (PROSCAR) 5 MG tablet Take 1 tablet (5 mg total) by mouth daily. 90 tablet 3  . folic acid (FOLVITE) 1 MG tablet Take 1 mg by mouth daily.   3  . gabapentin (NEURONTIN) 300 MG capsule Take 300-600 mg by mouth See admin instructions. Take 1 capsule (300 mg) by mouth in the morning & take 2 capsules (600 mg) by mouth at night.    . losartan (COZAAR) 100 MG tablet Take 1 tablet (100 mg total) by mouth daily. 90 tablet 3  . Multiple Vitamin (MULTIVITAMIN WITH MINERALS) TABS tablet Take 1 tablet by mouth daily.    Marland Kitchen omeprazole (PRILOSEC) 20 MG capsule Take 1 capsule (20 mg total) by mouth daily. 90 capsule 3  . potassium chloride SA (KLOR-CON) 20 MEQ tablet Take 1 tablet (20 mEq total) by mouth daily. 90 tablet 1  . rosuvastatin (CRESTOR) 10 MG tablet Take 1 tablet (10 mg total) by mouth daily. 90 tablet 3  . cetirizine (ZYRTEC) 10 MG tablet Take 1 tablet (10 mg total) by mouth at bedtime. (Patient not taking: Reported on 11/21/2020) 90 tablet 3   No facility-administered medications prior to visit.    Review of Systems  Review of Systems  Constitutional: Negative.   HENT: Positive for congestion.   Respiratory: Positive for cough, shortness of breath and wheezing.   Cardiovascular: Negative.    Physical Exam  BP 138/68 (BP Location: Left Arm, Cuff Size: Normal)   Pulse 89   Temp 97.6 F (36.4 C) (Temporal)   Ht _0  (1.702 m)   Wt 209 lb 6.4 oz (95 kg)   SpO2 98% Comment: RA   BMI 32.80 kg/m  Physical Exam Constitutional:      Appearance: Normal appearance.  HENT:     Head: Normocephalic and atraumatic.     Mouth/Throat:     Comments: Deferred d/t masking Cardiovascular:     Rate and Rhythm: Normal rate and regular rhythm.  Pulmonary:     Breath sounds: Wheezing and rhonchi present.  Musculoskeletal:        General: Normal range of motion.  Skin:    General: Skin is warm and dry.  Neurological:     General: No focal deficit present.     Mental Status: He is alert and oriented to person, place, and time. Mental status is at baseline.  Psychiatric:        Mood and Affect: Mood normal.  Behavior: Behavior normal.        Thought Content: Thought content normal.        Judgment: Judgment normal.      Lab Results:  CBC    Component Value Date/Time   WBC 7.0 05/02/2017 0853   RBC 4.67 05/02/2017 0853   HGB 14.1 05/02/2017 0853   HGB 13.9 02/20/2017 0951   HCT 41.6 05/02/2017 0853   HCT 41.5 02/20/2017 0951   PLT 230.0 05/02/2017 0853   PLT 222 02/20/2017 0951   MCV 89.1 05/02/2017 0853   MCV 90.4 02/20/2017 0951   MCH 30.3 02/20/2017 0951   MCHC 34.0 05/02/2017 0853   RDW 12.8 05/02/2017 0853   RDW 14.7 (H) 02/20/2017 0951   LYMPHSABS 0.7 (L) 02/20/2017 0951   MONOABS 0.5 02/20/2017 0951   EOSABS 0.2 02/20/2017 0951   BASOSABS 0.0 02/20/2017 0951    BMET    Component Value Date/Time   [place] 141 08/06/2019 1356   K 4.1 08/06/2019 1356   CL 107 (H) 08/06/2019 1356   CO2 23 08/06/2019 1356   GLUCOSE 147 (H) 08/06/2019 1356   GLUCOSE 114 (H) 05/02/2017 0853   BUN 12 08/06/2019 1356   CREATININE 1.12 08/06/2019 1356   CALCIUM 9.2 08/06/2019 1356   GFRNONAA 63 08/06/2019 1356   GFRAA 73 08/06/2019 1356    BNP No results found for: BNP  ProBNP No results found for: PROBNP  Imaging: No results found.   Assessment & Plan:   Acute bronchitis with COPD ([place]) PFTs in January 2022 showed no airflow limitation, mild  restriction and normal diffusion capacity but DLCO was not correlated for patients hemoglobin. Symptoms remain consistent with COPD gold 0. He was seen at [facility] urgent care yesterday for bronchitis symptoms. Started on Augmentin and ipratropium-albuterol nebulizer's. He continues to have a cough with moderate amount of audible wheezing. He received depo-medrol 16m IM injection today in office and starting prednisone taper. We will get repeat CXR in 3-4 weeks. If symptoms persist may benefit from trial Spiriva respimat 2.58m daily. Otherwise would monitor PFTs annually or biannually depending on progression of his symptoms.   Recommendations: - Continue Augmentin as prescribed  - Continue mucinex 600 twice a day  - Resume Zyrtec 1060maily - Continue Flonase and Singulair  - Use ipratropium-albuterol 4 times a day for the next week (8am,12pm,4pm,8pm) - Prednisone taper 48m50m3 days; 30mg17m days; 20mg 14mdays; 10mg x20mays  Office treatment: - 80mg de66mdrol x1   Follow-up: - 3-4 weeks with either Dr. Desai orShearon Stalls NP/Eustaquio Maizee for CXR 30 min prior to visit   Lower extremity edema - Checking BNP, he has follow-up with PCP scheduled for Friday      ElizabetMartyn Ehrich7/2022

## 2020-11-21 NOTE — Assessment & Plan Note (Signed)
-   Checking BNP, he has follow-up with PCP scheduled for Friday

## 2020-11-22 LAB — CBC WITH DIFFERENTIAL/PLATELET
Basophils Absolute: 0 10*3/uL (ref 0.0–0.1)
Basophils Relative: 0.7 % (ref 0.0–3.0)
Eosinophils Absolute: 0.1 10*3/uL (ref 0.0–0.7)
Eosinophils Relative: 1.3 % (ref 0.0–5.0)
HCT: 40.1 % (ref 39.0–52.0)
Hemoglobin: 13.7 g/dL (ref 13.0–17.0)
Lymphocytes Relative: 10.8 % — ABNORMAL LOW (ref 12.0–46.0)
Lymphs Abs: 0.5 10*3/uL — ABNORMAL LOW (ref 0.7–4.0)
MCHC: 34.2 g/dL (ref 30.0–36.0)
MCV: 89.3 fl (ref 78.0–100.0)
Monocytes Absolute: 0.6 10*3/uL (ref 0.1–1.0)
Monocytes Relative: 11.9 % (ref 3.0–12.0)
Neutro Abs: 3.7 10*3/uL (ref 1.4–7.7)
Neutrophils Relative %: 75.3 % (ref 43.0–77.0)
Platelets: 179 10*3/uL (ref 150.0–400.0)
RBC: 4.49 Mil/uL (ref 4.22–5.81)
RDW: 13.9 % (ref 11.5–15.5)
WBC: 4.9 10*3/uL (ref 4.0–10.5)

## 2020-11-22 LAB — BASIC METABOLIC PANEL
BUN: 11 mg/dL (ref 6–23)
CO2: 24 mEq/L (ref 19–32)
Calcium: 8.6 mg/dL (ref 8.4–10.5)
Chloride: 109 mEq/L (ref 96–112)
Creatinine, Ser: 1.09 mg/dL (ref 0.40–1.50)
GFR: 65.3 mL/min (ref >60.00)
Glucose, Bld: 119 mg/dL — ABNORMAL HIGH (ref 70–99)
Potassium: 4 mEq/L (ref 3.5–5.1)
Sodium: 140 mEq/L (ref 135–145)

## 2020-11-22 LAB — BRAIN NATRIURETIC PEPTIDE: Pro B Natriuretic peptide (BNP): 92 pg/mL (ref 0.0–100.0)

## 2020-11-23 NOTE — Telephone Encounter (Signed)
Please advise on patient mychart message    I have reviewed the labs but can't tell if I need to see the cardiologist or just [facility] tomorrow. Will you be calling to let me know?   Thanks,  [employer]

## 2020-11-24 ENCOUNTER — Encounter: Payer: Self-pay | Admitting: Internal Medicine

## 2020-11-24 ENCOUNTER — Ambulatory Visit (INDEPENDENT_AMBULATORY_CARE_PROVIDER_SITE_OTHER): Payer: Medicare Other | Admitting: Internal Medicine

## 2020-11-24 ENCOUNTER — Other Ambulatory Visit: Payer: Self-pay

## 2020-11-24 ENCOUNTER — Other Ambulatory Visit: Payer: Self-pay | Admitting: Internal Medicine

## 2020-11-24 VITALS — BP 138/72 | HR 82 | Temp 98.0°F | Ht 67.0 in | Wt 208.0 lb

## 2020-11-24 DIAGNOSIS — J440 Chronic obstructive pulmonary disease with acute lower respiratory infection: Secondary | ICD-10-CM | POA: Diagnosis not present

## 2020-11-24 DIAGNOSIS — I10 Essential (primary) hypertension: Secondary | ICD-10-CM

## 2020-11-24 DIAGNOSIS — J209 Acute bronchitis, unspecified: Secondary | ICD-10-CM | POA: Diagnosis not present

## 2020-11-24 DIAGNOSIS — I872 Venous insufficiency (chronic) (peripheral): Secondary | ICD-10-CM | POA: Diagnosis not present

## 2020-11-24 MED ORDER — AMOXICILLIN-POT CLAVULANATE 875-125 MG PO TABS: 1.0000 | ORAL_TABLET | Freq: Two times a day (BID) | ORAL | 0 refills | Status: DC

## 2020-11-24 MED ORDER — FUROSEMIDE 40 MG PO TABS: 40.0000 mg | ORAL_TABLET | Freq: Every day | ORAL | 1 refills | Status: DC | PRN

## 2020-11-24 MED ORDER — METHYLPREDNISOLONE ACETATE 80 MG/ML IJ SUSP
80.0000 mg | Freq: Once | INTRAMUSCULAR | Status: AC
  Administered 2020-11-24: 80 mg via INTRAMUSCULAR

## 2020-11-24 NOTE — Progress Notes (Signed)
Subjective:    Patient ID: Thomas Hudson, male    DOB: 11/12/1940, 80 y.o.   MRN: [id number]  HPI Here due to leg swelling This visit occurred during the SARS-CoV-2 public health emergency.  Safety protocols were in place, including screening questions prior to the visit, additional usage of staff PPE, and extensive cleaning of exam room while observing appropriate contact time as indicated for disinfecting solutions.   He feels like his chest is tightening back up Coughing --but mostly dry Continues prednisone 64m daily now No fever Hasn't picked up the nebulizer--using the inhaler though ([facility] Did get neb [facility] at urgent care 4 days ago--that worked better than the inhalers Out of antibiotics---took it twice as much as he was supposed to  Has leg swelling for about a month No clear worsening day or night Doesn't use salt [place]  Current Outpatient Medications on File Prior to Visit  Medication Sig Dispense Refill  . albuterol (VENTOLIN HFA) 108 (90 Base) MCG/ACT inhaler Inhale 2 puffs into the lungs every 6 (six) hours as needed for wheezing or shortness of breath. 3 each 3  . alfuzosin (UROXATRAL) 10 MG 24 hr tablet Take 1 tablet (10 mg total) by mouth daily. TAKE 1 TABLET BY MOUTH DAILY 90 tablet 3  . amLODipine (NORVASC) 5 MG tablet Take 1 tablet (5 mg total) by mouth daily. 90 tablet 3  . aspirin-acetaminophen-caffeine (EXCEDRIN MIGRAINE) 250-250-65 MG tablet Take 2 tablets by mouth 3 (three) times daily as needed for headache.    . Certolizumab Pegol (CIMZIA) 2 X 200 MG KIT Inject 2 each into the skin every 30 (thirty) days.    . cetirizine (ZYRTEC) 10 MG tablet Take 1 tablet (10 mg total) by mouth at bedtime. 90 tablet 3  . erythromycin ophthalmic ointment Place 1 application into both eyes daily as needed (dry eyes).    . ferrous sulfate 325 (65 FE) MG tablet Take 1 tablet (325 mg total) by mouth 3 (three)  times daily with meals. 270 tablet 3  . finasteride (PROSCAR) 5 MG tablet Take 1 tablet (5 mg total) by mouth daily. 90 tablet 3  . fluticasone (FLONASE) 50 MCG/ACT nasal spray Place 2 sprays into both nostrils daily as needed for allergies. 48 g 3  . folic acid (FOLVITE) 1 MG tablet Take 1 mg by mouth daily.   3  . gabapentin (NEURONTIN) 300 MG capsule Take 300-600 mg by mouth See admin instructions. Take 1 capsule (300 mg) by mouth in the morning & take 2 capsules (600 mg) by mouth at night.    . losartan (COZAAR) 100 MG tablet Take 1 tablet (100 mg total) by mouth daily. 90 tablet 3  . montelukast (SINGULAIR) 10 MG tablet Take 1 tablet (10 mg total) by mouth at bedtime. 90 tablet 3  . Multiple Vitamin (MULTIVITAMIN WITH MINERALS) TABS tablet Take 1 tablet by mouth daily.    .Marland Kitchenomeprazole (PRILOSEC) 20 MG capsule Take 1 capsule (20 mg total) by mouth daily. 90 capsule 3  . potassium chloride SA (KLOR-CON) 20 MEQ tablet Take 1 tablet (20 mEq total) by mouth daily. 90 tablet 1  . predniSONE (DELTASONE) 10 MG tablet Take 4 tabs po daily x 3 days; then 3 tabs daily x3 days; then 2 tabs daily x3 days; then 1 tab daily x 3 days; then stop 30 tablet 0  . predniSONE (DELTASONE) 5 MG tablet  Take 5 mg by mouth See admin instructions. Take 1 tablet daily    . rosuvastatin (CRESTOR) 10 MG tablet Take 1 tablet (10 mg total) by mouth daily. 90 tablet 3   No current facility-administered medications on file prior to visit.    Allergies  Allergen Reactions  . Morphine And Related Other (See Comments)    Itching & rash Because of a history of documented adverse serious drug reaction;Medi Alert bracelet  is recommended    Past Medical History:  Diagnosis Date  . BPH (benign prostatic hyperplasia)   . Carpal tunnel syndrome on both sides   . GERD (gastroesophageal reflux disease)   . Hyperlipidemia   . Hypertension   . RAD (reactive airway disease)    no PMH of asthma; RAD post infection  . Rheumatoid  arthritis [facility])         Past Surgical History:  Procedure Laterality Date  . BIOPSY  01/06/2018   Procedure: BIOPSY;  Surgeon: Irene Shipper, MD;  Location: [facility] ENDOSCOPY;  Service: Endoscopy;;  . BIOPSY  01/03/2020   Procedure: BIOPSY;  Surgeon: Irene Shipper, MD;  Location: [facility] ENDOSCOPY;  Service: Endoscopy;;  . COLONOSCOPY W/ POLYPECTOMY  2009   X 2; Dr.Perry   . ERCP N/A 11/05/2016   Procedure: ENDOSCOPIC RETROGRADE CHOLANGIOPANCREATOGRAPHY (ERCP);  Surgeon: Irene Shipper, MD;  Location: [facility] ENDOSCOPY;  Service: Endoscopy;  Laterality: N/A;  . ESOPHAGOGASTRODUODENOSCOPY (EGD) WITH PROPOFOL N/A 07/29/2016   Procedure: ESOPHAGOGASTRODUODENOSCOPY (EGD) WITH PROPOFOL;  Surgeon: Irene Shipper, MD;  Location: [facility] ENDOSCOPY;  Service: Endoscopy;  Laterality: N/A;  will need ERCP scope  . ESOPHAGOGASTRODUODENOSCOPY (EGD) WITH PROPOFOL N/A 01/06/2018   Procedure: ESOPHAGOGASTRODUODENOSCOPY (EGD) WITH PROPOFOL;  Surgeon: Irene Shipper, MD;  Location: [facility] ENDOSCOPY;  Service: Endoscopy;  Laterality: N/A;  NEED ERCP SCOPE  . ESOPHAGOGASTRODUODENOSCOPY (EGD) WITH PROPOFOL N/A 01/03/2020   Procedure: ESOPHAGOGASTRODUODENOSCOPY (EGD) WITH PROPOFOL;  Surgeon: Irene Shipper, MD;  Location: [facility] ENDOSCOPY;  Service: Endoscopy;  Laterality: N/A;  Needs ERCP scope  . HERNIA REPAIR    . LUMBAR LAMINECTOMY  07/2008  . UPPER GASTROINTESTINAL ENDOSCOPY      gastric polyp;Dr.Perry   . VASECTOMY    . WHIPPLE PROCEDURE  2012   partial ; [facility]    Family History  Problem Relation Age of Onset  . Throat cancer Father        smoker  . Esophageal cancer Father   . Prostate cancer Paternal Uncle   . Heart attack Paternal Uncle        3 uncles, >55  . Coronary artery disease Brother        in 43s  . Esophageal cancer Brother   . Skin cancer Mother   . Throat cancer Brother   . Heart disease Brother   . Uterine cancer Sister   . COPD Sister        X72  . Ovarian cancer Sister   . Stroke Paternal Grandmother 36  .  Colon cancer Paternal Grandfather   . Heart disease Paternal Grandfather   . Heart disease Maternal Grandfather   . Throat cancer Sister        smoker  . Breast cancer Sister   . Lung cancer Sister        smoker  . Leukemia Sister   . Heart disease Brother   . Asthma Neg Hx     Social History   Socioeconomic History  . Marital status: Married    Spouse  name: Not on file  . Number of children: 2  . Years of education: Not on file  . Highest education level: Not on file  Occupational History  . 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 Smoking status: Former Smoker    Packs/day: 2.00    Years: 50.00    Pack years: 100.00    Types: Cigarettes    Quit date: 07/08/1997    Years since quitting: 23.3  . Smokeless tobacco: Never Used  Substance and Sexual Activity  . Alcohol use: No    Alcohol/week: 0.0 standard drinks  . Drug use: No  . Sexual activity: Yes  Other Topics Concern  . Not on file  Social History Narrative   Has living will   Requests daughters as health care POA   Would accept resuscitation attempts   Not sure about tube feeds   Social Determinants of Health   Financial Resource Strain: Not on file  Food Insecurity: Not on file  Transportation Needs: Not on file  Physical Activity: Not on file  Stress: Not on file  Social Connections: Not on file  Intimate Partner Violence: Not on file   Review of Systems  Weight is up Eating more lately---and not as active     Objective:   Physical Exam Constitutional:      General: He is not in acute distress. Cardiovascular:     Rate and Rhythm: Normal rate and regular rhythm.     Heart sounds: No murmur heard. No gallop.   Pulmonary:     Comments: Audible wheezing and tight Fair air movement No crackles Abdominal:     Palpations: Abdomen is soft.     Comments: Small ventral hernia at the base of his long incision--small and clearly not intestine (doesn't reduce)   Musculoskeletal:     Comments: Trace to 1+ edema in calves---not really pitting  Neurological:     Mental Status: He is alert.            Assessment & Plan:

## 2020-11-24 NOTE — Assessment & Plan Note (Signed)
BP Readings from Last 3 Encounters:  11/24/20 138/72  11/21/20 138/68  07/31/20 118/64   Okay on the amlodipine and losartan May want to consider changing the amlodipine if the edema doesn't improve

## 2020-11-24 NOTE — Addendum Note (Signed)
Addended by: Pilar Grammes on: 11/24/2020 12:58 PM   Modules accepted: Orders

## 2020-11-24 NOTE — Telephone Encounter (Signed)
Follow up with PCP today, she can determine if you need to see cardiology. May not

## 2020-11-24 NOTE — Assessment & Plan Note (Addendum)
Worsened again as the IM steroid seems to be wearing off Will give IM depomedrol 80mg  Continue prednisone Extend augmentin He needs to get the nebulizer today

## 2020-11-24 NOTE — Assessment & Plan Note (Signed)
BNP normal this week Normal EF on echo last year Will try furosemide prn---might help his breathing as well

## 2020-11-27 ENCOUNTER — Other Ambulatory Visit: Payer: Self-pay | Admitting: Cardiovascular Disease

## 2020-12-12 ENCOUNTER — Encounter: Payer: Self-pay | Admitting: Internal Medicine

## 2020-12-12 ENCOUNTER — Ambulatory Visit (INDEPENDENT_AMBULATORY_CARE_PROVIDER_SITE_OTHER): Payer: Medicare Other | Admitting: Internal Medicine

## 2020-12-12 ENCOUNTER — Other Ambulatory Visit: Payer: Self-pay

## 2020-12-12 ENCOUNTER — Ambulatory Visit (INDEPENDENT_AMBULATORY_CARE_PROVIDER_SITE_OTHER): Payer: Medicare Other

## 2020-12-12 VITALS — BP 134/60 | HR 80 | Temp 97.6°F | Ht 67.0 in | Wt 207.0 lb

## 2020-12-12 DIAGNOSIS — J42 Unspecified chronic bronchitis: Secondary | ICD-10-CM

## 2020-12-12 DIAGNOSIS — J449 Chronic obstructive pulmonary disease, unspecified: Secondary | ICD-10-CM

## 2020-12-12 DIAGNOSIS — K219 Gastro-esophageal reflux disease without esophagitis: Secondary | ICD-10-CM | POA: Diagnosis not present

## 2020-12-12 DIAGNOSIS — I10 Essential (primary) hypertension: Secondary | ICD-10-CM | POA: Diagnosis not present

## 2020-12-12 MED ORDER — ALBUTEROL SULFATE (2.5 MG/3ML) 0.083% IN NEBU: 2.5000 mg | INHALATION_SOLUTION | Freq: Four times a day (QID) | RESPIRATORY_TRACT | 12 refills | Status: DC | PRN

## 2020-12-12 MED ORDER — TRELEGY ELLIPTA 100-62.5-25 MCG/INH IN AEPB: 100.0000 ug | INHALATION_SPRAY | Freq: Every day | RESPIRATORY_TRACT | 0 refills | Status: DC

## 2020-12-12 MED ORDER — TRELEGY ELLIPTA 100-62.5-25 MCG/INH IN AEPB: 1.0000 | INHALATION_SPRAY | Freq: Every day | RESPIRATORY_TRACT | 3 refills | Status: DC

## 2020-12-12 NOTE — Progress Notes (Signed)
Thomas Hudson    [id number]    1940/11/13  Primary Care Physician:Letvak, Theophilus Kinds, MD Date of Appointment: 12/12/2020 Established Patient Visit  Chief complaint:   Chief Complaint  Patient presents with  . Follow-up    Wheezing, sob, coughing yellow phlegm     HPI: Thomas Hudson is a 80 y.o. gentleman with history of allergic rhinitis who presented Nov 2021 with shortness of breath and was started on albuterol prn.   Interval Updates: Here for follow up after recent exacerbation. Worsening control of COPD over the last 6 months. Has been receiving steroid injections from PCP and our office. Also had two courses of prednisone in the last 6 months. Also had augmentin. Has completed his recent prednisone taper. Back to 5 mg daily for his rheumatoid arthritis.   He is taking the albuterol inhaler 4 times/day.  It does seem to be helping his breathing.  Feels very short of breath during the day and fatigued. No fevers. No hemoptysis.   I have reviewed the patient's family social and past medical history and updated as appropriate.   Past Medical History:  Diagnosis Date  . BPH (benign prostatic hyperplasia)   . Carpal tunnel syndrome on both sides   . GERD (gastroesophageal reflux disease)   . Hyperlipidemia   . Hypertension   . RAD (reactive airway disease)    no PMH of asthma; RAD post infection  . Rheumatoid arthritis [facility])         Past Surgical History:  Procedure Laterality Date  . BIOPSY  01/06/2018   Procedure: BIOPSY;  Surgeon: Irene Shipper, MD;  Location: [facility];  Service: Endoscopy;;  . BIOPSY  01/03/2020   Procedure: BIOPSY;  Surgeon: Irene Shipper, MD;  Location: [facility];  Service: Endoscopy;;  . COLONOSCOPY W/ POLYPECTOMY  2009   X 2; Dr.Perry   . ERCP N/A 11/05/2016   Procedure: ENDOSCOPIC RETROGRADE CHOLANGIOPANCREATOGRAPHY (ERCP);  Surgeon: Irene Shipper, MD;  Location: [facility];  Service: Endoscopy;  Laterality: N/A;  .  ESOPHAGOGASTRODUODENOSCOPY (EGD) WITH PROPOFOL N/A 07/29/2016   Procedure: ESOPHAGOGASTRODUODENOSCOPY (EGD) WITH PROPOFOL;  Surgeon: Irene Shipper, MD;  Location: [facility];  Service: Endoscopy;  Laterality: N/A;  will need ERCP scope  . ESOPHAGOGASTRODUODENOSCOPY (EGD) WITH PROPOFOL N/A 01/06/2018   Procedure: ESOPHAGOGASTRODUODENOSCOPY (EGD) WITH PROPOFOL;  Surgeon: Irene Shipper, MD;  Location: [facility];  Service: Endoscopy;  Laterality: N/A;  NEED ERCP SCOPE  . ESOPHAGOGASTRODUODENOSCOPY (EGD) WITH PROPOFOL N/A 01/03/2020   Procedure: ESOPHAGOGASTRODUODENOSCOPY (EGD) WITH PROPOFOL;  Surgeon: Irene Shipper, MD;  Location: [facility];  Service: Endoscopy;  Laterality: N/A;  Needs ERCP scope  . HERNIA REPAIR    . LUMBAR LAMINECTOMY  07/2008  . UPPER GASTROINTESTINAL ENDOSCOPY      gastric polyp;Dr.Perry   . VASECTOMY    . WHIPPLE PROCEDURE  2012   partial ; [facility]    Family History  Problem Relation Age of Onset  . Throat cancer Father        smoker  . Esophageal cancer Father   . Prostate cancer Paternal Uncle   . Heart attack Paternal Uncle        3 uncles, >55  . Coronary artery disease Brother        in 55s  . Esophageal cancer Brother   . Skin cancer Mother   . Throat cancer Brother   . Heart disease Brother   . Uterine cancer  Sister   . COPD Sister        X1  . Ovarian cancer Sister   . Stroke Paternal Grandmother 74  . Colon cancer Paternal Grandfather   . Heart disease Paternal Grandfather   . Heart disease Maternal Grandfather   . Throat cancer Sister        smoker  . Breast cancer Sister   . Lung cancer Sister        smoker  . Leukemia Sister   . Heart disease Brother   . Asthma Neg Hx     Social History   Occupational History  . 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 Smoking status: Former Smoker    Packs/day: 2.00    Years: 50.00    Pack years: 100.00    Types: Cigarettes    Quit date: 07/08/1997     Years since quitting: 23.4  . Smokeless tobacco: Never Used  Substance and Sexual Activity  . Alcohol use: No    Alcohol/week: 0.0 standard drinks  . Drug use: No  . Sexual activity: Yes     Physical Exam: Blood pressure 134/60, pulse 80, temperature 97.6 F (36.4 C), temperature source Temporal, height 5\' 7"  (1.702 m), weight 207 lb (93.9 kg), SpO2 98 %.  Gen:     No acute distress Lungs:   Diminished bilaterally, no wheezes or crackles CV:        RRR, no mrg   Data Reviewed: Imaging: I have personally reviewed the CT Chest Cardiac with limited lung windows in Feb 2021 - lung windows available are clear without nodules of effusion.   Chest xray obtained today shows no acute cardiopulmonary process. Shows some signs of chronic bronchitis  PFTs: Mild restriction to ventilation Jan 2022  Labs: Lab Results  Component Value Date   WBC 4.9 11/21/2020   HGB 13.7 11/21/2020   HCT 40.1 11/21/2020   MCV 89.3 11/21/2020   PLT 179.0 11/21/2020   Lab Results  Component Value Date   NA 140 11/21/2020   K 4.0 11/21/2020   CL 109 11/21/2020   CO2 24 11/21/2020     Immunization status: Immunization History  Administered Date(s) Administered  . Fluad Quad(high Dose 65+) 05/12/2019, 05/12/2020  . Influenza Split 06/25/2011, 04/15/2012  . Influenza Whole 05/04/2009, 06/06/2010  . Influenza, High Dose Seasonal PF 05/03/2013  . Influenza,inj,Quad PF,6+ Mos 04/07/2014, 04/03/2015, 04/19/2016, 05/02/2017  . Influenza-Unspecified 05/13/2012, 04/07/2014, 04/21/2018  . PFIZER(Purple Top)SARS-COV-2 Vaccination 07/30/2019, 08/20/2019  . Pneumococcal Conjugate-13 04/07/2014  . Pneumococcal Polysaccharide-23 05/04/2009, 04/07/2014, 05/02/2017  . Td 08/08/2010  . Zoster, Live 08/10/2012    Assessment:  COPD, worsening control. Chronic Rhinitis - improved control  Plan/Recommendations: Continue albuterol Will prescribe him with a nebulizer machine - sent to [facility]. If they  don't have one there he will need one sent to a DME locally. Albuterol nebs sent to pharmacy. Will start trelegy. Samples given today. Continue singulair, flonase, zyrtect for rhinitis, with nasal saline.   Return to Care: Return in about 3 months (around 03/14/2021).   Lenice Llamas, MD Pulmonary and [place]

## 2020-12-12 NOTE — Patient Instructions (Addendum)
The patient should have follow up scheduled with myself in 3 months.   Start trelegy inhaler 1 puff once a day. Gargle after use.  Will send a nebulizer machine to [facility] - if you have issues call us back and we will send it to a DME.  Continue albuterol inhaler as needed. I sent the albuterol nebulizer medicines to your pharmacy at [facility].

## 2020-12-15 DIAGNOSIS — M069 Rheumatoid arthritis, unspecified: Secondary | ICD-10-CM | POA: Diagnosis not present

## 2020-12-26 DIAGNOSIS — Z79899 Other long term (current) drug therapy: Secondary | ICD-10-CM | POA: Diagnosis not present

## 2020-12-26 DIAGNOSIS — M1990 Unspecified osteoarthritis, unspecified site: Secondary | ICD-10-CM | POA: Diagnosis not present

## 2020-12-26 DIAGNOSIS — M150 Primary generalized (osteo)arthritis: Secondary | ICD-10-CM | POA: Diagnosis not present

## 2020-12-26 DIAGNOSIS — M542 Cervicalgia: Secondary | ICD-10-CM | POA: Diagnosis not present

## 2020-12-26 DIAGNOSIS — M069 Rheumatoid arthritis, unspecified: Secondary | ICD-10-CM | POA: Diagnosis not present

## 2020-12-26 DIAGNOSIS — M0600 Rheumatoid arthritis without rheumatoid factor, unspecified site: Secondary | ICD-10-CM | POA: Diagnosis not present

## 2020-12-26 DIAGNOSIS — M5136 Other intervertebral disc degeneration, lumbar region: Secondary | ICD-10-CM | POA: Diagnosis not present

## 2020-12-27 ENCOUNTER — Other Ambulatory Visit: Payer: Self-pay

## 2020-12-27 ENCOUNTER — Telehealth: Payer: Self-pay | Admitting: Internal Medicine

## 2020-12-27 DIAGNOSIS — R0609 Other forms of dyspnea: Secondary | ICD-10-CM

## 2020-12-27 MED ORDER — POTASSIUM CHLORIDE CRYS ER 20 MEQ PO TBCR: 20.0000 meq | EXTENDED_RELEASE_TABLET | Freq: Every day | ORAL | 3 refills | Status: DC

## 2020-12-27 NOTE — Telephone Encounter (Signed)
Called and spoke with pt and he stated that [facility] has the albuterol nebulizer medication and he will pick that up tomorrow but they no longer get the nebulizer machines.  DME order has been placed for the pt to get a nebulizer machine from a local DME.

## 2020-12-27 NOTE — Telephone Encounter (Signed)
Patient called asking for refill on Potassium RX to the pharmacy on file today please. Patient asked for a year worth of refills, last time filled in December 2021 for 6 months only.  Please review.

## 2020-12-27 NOTE — Telephone Encounter (Signed)
Pt stated that he was informed by Marissa to contact us if [facility] did not have the nebulizer and the pt stated that they do not and would need to have it sent to his home so that he can have access to it. Pls regard; [phone]

## 2020-12-29 ENCOUNTER — Telehealth: Payer: Self-pay

## 2020-12-29 MED ORDER — ALFUZOSIN HCL ER 10 MG PO TB24: 10.0000 mg | ORAL_TABLET | Freq: Every day | ORAL | 0 refills | Status: DC

## 2020-12-29 NOTE — Telephone Encounter (Signed)
Pt states that the [facility] fills his meds, says that his alfuzosin is on back order right now. Pt requests a 90 day supply be sent to [facility] [address], in [place]. Refill sent in.

## 2021-01-15 ENCOUNTER — Other Ambulatory Visit: Payer: Self-pay | Admitting: Internal Medicine

## 2021-01-15 NOTE — Telephone Encounter (Signed)
Left message for pt to call back. Need clarification on his furosemide refill. He is supposed to be taking it PRN leg swelling. Rx was sent 11-24-20 #30 and 1 refill. Refill request says he got it 12-19-20.   Is he taking it everyday or more than once a day or is this an automatic refill request from the pharmacy?

## 2021-01-16 DIAGNOSIS — M069 Rheumatoid arthritis, unspecified: Secondary | ICD-10-CM | POA: Diagnosis not present

## 2021-01-18 NOTE — Telephone Encounter (Signed)
Left another message.

## 2021-02-07 ENCOUNTER — Telehealth: Payer: Self-pay | Admitting: Internal Medicine

## 2021-02-07 ENCOUNTER — Ambulatory Visit: Payer: Medicare Other

## 2021-02-07 ENCOUNTER — Encounter: Payer: Self-pay | Admitting: Oncology

## 2021-02-07 DIAGNOSIS — R059 Cough, unspecified: Secondary | ICD-10-CM

## 2021-02-07 NOTE — Progress Notes (Signed)
'@Patient'  ID: Thomas Hudson, male    DOB: [birth year], 80 y.o.   MRN: [id number]  Chief Complaint  Patient presents with   Acute Visit    Acute visit for increased coughing and wheezing for the past 4-5 days. Cough is semi productive, phlegm is greenish yellowish. Denies any fevers.     Referring provider: [name], MD  HPI: 80 year old male. PMH significant for allergic rhinitis, dyspnea on exertion. Former patient of [name]. He is now following with [name]. Maintained on Trelegy 165mg, Singulair, prn Ventolin hfa/albuterol neb.   Previous [facility] pulmonary encounter:  07/31/20- Dr. DShearon StallsHere for follow up today after PFTs and trial of albuterol. He never picked up albuterol because it was so expensive - cost him $115. Still sob with exertion. Occasional cough due to rhinitis.  I have reviewed the patient's family social and past medical history and updated as appropriate.  Suspected COPD, started on albuterol during last visit. PFTs in January showed no airflow limitation.   11/21/2020-[name] Patient presents today for acute visit. During last visit he was suspected to have COPD, given albuterol inhaler. PFTs in January 2022 showed no airflow limitation. [facility] and left sided rib pain. Wheezing and rhonchi noted on exam. CXR suggested mild interstitial pulmonary edema. He was prescribed Augmentin and ipratropium-albuterol nebulzier for COPD exacerbation duoneb. He is taking antibiotic as prescribed, feel his cough has slightly improved but still has significant amount of wheezing. He has been using duoneb nebulizer twice a day. He ran out of zyrtec. He is compliant with flonase and Singulair. He is on 558mprednisone a day for arthritis. *Ok to leave message with lab results   12/12/20- Dr. DeShearon Stallsere for follow up after recent exacerbation. Worsening control of COPD over the last 6 months. Has been receiving steroid injections from PCP and our  office. Also had two courses of prednisone in the last 6 months. Also had augmentin. Has completed his recent prednisone taper. Back to 5 mg daily for his rheumatoid arthritis.   He is taking the albuterol inhaler 4 times/day.  It does seem to be helping his breathing.  Feels very short of breath during the day and fatigued. No fevers. No hemoptysis.   I have reviewed the patient's family social and past medical history and updated as appropriate.   02/08/2021- Interim  Patient presents today for acute visit/possible pneumonia. Ordered for stat CXR prior to visit today. He has been experiencing cough, wheezing and congestion x 4-5 days. Cough is occasionally productive with grey/green mucus. States that he would be outside working in the heat which exacerbated his symptoms. He is compliant with Trelegy 10035mand has been using Ventolin hfa 1-2 times a day along with his Albuterol nebulizer once a day. CXR today showed cardiomegaly, low lung volumes with bibasilar atelectasis. He was recently placed on lasix in the last couple of months by his PCP.    Allergies  Allergen Reactions   Morphine And Related Other (See Comments)    Itching & rash Because of a history of documented adverse serious drug reaction;Medi Alert bracelet  is recommended    Immunization History  Administered Date(s) Administered   Fluad Quad(high Dose 65+) 05/12/2019, 05/12/2020   Influenza Split 06/25/2011, 04/15/2012   Influenza Whole 05/04/2009, 06/06/2010   Influenza, High Dose Seasonal PF 05/03/2013   Influenza,inj,Quad PF,6+ Mos 04/07/2014, 04/03/2015, 04/19/2016, 05/02/2017   Influenza-Unspecified 05/13/2012, 04/07/2014,  04/21/2018   PFIZER(Purple Top)SARS-COV-2 Vaccination 07/30/2019, 08/20/2019   Pneumococcal Conjugate-13 04/07/2014   Pneumococcal Polysaccharide-23 05/04/2009, 04/07/2014, 05/02/2017   Td 08/08/2010   Zoster, Live 08/10/2012    Past Medical History:  Diagnosis Date   BPH (benign prostatic  hyperplasia)    Carpal tunnel syndrome on both sides    GERD (gastroesophageal reflux disease)    Hyperlipidemia    Hypertension    RAD (reactive airway disease)    no PMH of asthma; RAD post infection   Rheumatoid arthritis ([place])         Tobacco History: Social History   Tobacco Use  Smoking Status Former   Packs/day: 2.00   Years: 50.00   Pack years: 100.00   Types: Cigarettes   Quit date: 07/08/1997   Years since quitting: 23.6  Smokeless Tobacco Never   Counseling given: Not Answered   Outpatient Medications Prior to Visit  Medication Sig Dispense Refill   albuterol (PROVENTIL) (2.5 MG/3ML) 0.083% nebulizer solution Take 3 mLs (2.5 mg total) by nebulization every 6 (six) hours as needed for wheezing or shortness of breath. 75 mL 12   albuterol (VENTOLIN HFA) 108 (90 Base) MCG/ACT inhaler Inhale 2 puffs into the lungs every 6 (six) hours as needed for wheezing or shortness of breath. 3 each 3   alfuzosin (UROXATRAL) 10 MG 24 hr tablet Take 1 tablet (10 mg total) by mouth daily. TAKE 1 TABLET BY MOUTH DAILY 90 tablet 0   amLODipine (NORVASC) 5 MG tablet Take 1 tablet (5 mg total) by mouth daily. 90 tablet 3   aspirin-acetaminophen-caffeine (EXCEDRIN MIGRAINE) 250-250-65 MG tablet Take 2 tablets by mouth 3 (three) times daily as needed for headache.     Certolizumab Pegol (CIMZIA) 2 X 200 MG KIT Inject 2 each into the skin every 30 (thirty) days.     cetirizine (ZYRTEC) 10 MG tablet Take 1 tablet (10 mg total) by mouth at bedtime. 90 tablet 3   doxycycline (MONODOX) 50 MG capsule      erythromycin ophthalmic ointment Place 1 application into both eyes daily as needed (dry eyes).     ferrous sulfate 325 (65 FE) MG tablet Take 1 tablet (325 mg total) by mouth 3 (three) times daily with meals. 270 tablet 3   finasteride (PROSCAR) 5 MG tablet Take 1 tablet (5 mg total) by mouth daily. 90 tablet 3   fluticasone (FLONASE) 50 MCG/ACT nasal spray Place 2 sprays into both nostrils daily  as needed for allergies. 48 g 3   Fluticasone-Umeclidin-Vilant (TRELEGY ELLIPTA) 100-62.5-25 MCG/INH AEPB Inhale 1 puff into the lungs daily. 3 each 3   folic acid (FOLVITE) 1 MG tablet Take 1 mg by mouth daily.   3   furosemide (LASIX) 40 MG tablet Take 1 tablet (40 mg total) by mouth daily as needed. For increased leg swelling 30 tablet 1   gabapentin (NEURONTIN) 300 MG capsule Take 300-600 mg by mouth See admin instructions. Take 1 capsule (300 mg) by mouth in the morning & take 2 capsules (600 mg) by mouth at night.     losartan (COZAAR) 100 MG tablet Take 1 tablet (100 mg total) by mouth daily. 90 tablet 3   montelukast (SINGULAIR) 10 MG tablet Take 1 tablet (10 mg total) by mouth at bedtime. 90 tablet 3   Multiple Vitamin (MULTIVITAMIN WITH MINERALS) TABS tablet Take 1 tablet by mouth daily.     omeprazole (PRILOSEC) 20 MG capsule Take 1 capsule (20 mg total) by mouth daily.  90 capsule 3   potassium chloride SA (KLOR-CON) 20 MEQ tablet Take 1 tablet (20 mEq total) by mouth daily. 90 tablet 3   predniSONE (DELTASONE) 5 MG tablet Take 5 mg by mouth See admin instructions. Take 1 tablet daily     rosuvastatin (CRESTOR) 10 MG tablet Take 1 tablet (10 mg total) by mouth daily. Please make overdue appt with Dr. Acie Fredrickson before anymore refills. Thank you 1st attempt 30 tablet 0   Fluticasone-Umeclidin-Vilant (TRELEGY ELLIPTA) 100-62.5-25 MCG/INH AEPB Inhale 100 mcg into the lungs daily. 28 each 0   predniSONE (DELTASONE) 10 MG tablet Take 4 tabs po daily x 3 days; then 3 tabs daily x3 days; then 2 tabs daily x3 days; then 1 tab daily x 3 days; then stop 30 tablet 0   No facility-administered medications prior to visit.   Review of Systems  Review of Systems  Constitutional: Negative.   HENT:  Positive for congestion.   Respiratory:  Positive for cough, chest tightness and wheezing.   Cardiovascular:  Negative for leg swelling.    Physical Exam  BP 122/78   Pulse 84   Temp 97.6 F (36.4 C)  (Oral)   Ht '5\' 7"'  (1.702 m)   Wt 212 lb 6.4 oz (96.3 kg)   SpO2 98% Comment: on RA  BMI 33.27 kg/m  Physical Exam Constitutional:      Appearance: Normal appearance.  HENT:     Head: Normocephalic and atraumatic.     Right Ear: Tympanic membrane normal. There is no impacted cerumen.     Left Ear: Tympanic membrane normal. There is no impacted cerumen.     Mouth/Throat:     Mouth: Mucous membranes are moist.     Pharynx: Oropharynx is clear.  Cardiovascular:     Rate and Rhythm: Normal rate and regular rhythm.  Pulmonary:     Effort: Pulmonary effort is normal.     Breath sounds: Wheezing present. No rhonchi or rales.  Musculoskeletal:        General: Normal range of motion.  Skin:    General: Skin is warm and dry.  Neurological:     General: No focal deficit present.     Mental Status: He is alert and oriented to person, place, and time. Mental status is at baseline.  Psychiatric:        Mood and Affect: Mood normal.        Behavior: Behavior normal.        Thought Content: Thought content normal.        Judgment: Judgment normal.     Lab Results:  CBC    Component Value Date/Time   WBC 4.9 11/21/2020 1557   RBC 4.49 11/21/2020 1557   HGB 13.7 11/21/2020 1557   HGB 13.9 02/20/2017 0951   HCT 40.1 11/21/2020 1557   HCT 41.5 02/20/2017 0951   PLT 179.0 11/21/2020 1557   PLT 222 02/20/2017 0951   MCV 89.3 11/21/2020 1557   MCV 90.4 02/20/2017 0951   MCH 30.3 02/20/2017 0951   MCHC 34.2 11/21/2020 1557   RDW 13.9 11/21/2020 1557   RDW 14.7 (H) 02/20/2017 0951   LYMPHSABS 0.5 (L) 11/21/2020 1557   LYMPHSABS 0.7 (L) 02/20/2017 0951   MONOABS 0.6 11/21/2020 1557   MONOABS 0.5 02/20/2017 0951   EOSABS 0.1 11/21/2020 1557   EOSABS 0.2 02/20/2017 0951   BASOSABS 0.0 11/21/2020 1557   BASOSABS 0.0 02/20/2017 0951    BMET    Component Value Date/Time  NA 140 11/21/2020 1557   NA 141 08/06/2019 1356   K 4.0 11/21/2020 1557   CL 109 11/21/2020 1557   CO2 24  11/21/2020 1557   GLUCOSE 119 (H) 11/21/2020 1557   BUN 11 11/21/2020 1557   BUN 12 08/06/2019 1356   CREATININE 1.09 11/21/2020 1557   CALCIUM 8.6 11/21/2020 1557   GFRNONAA 63 08/06/2019 1356   GFRAA 73 08/06/2019 1356    BNP No results found for: BNP  ProBNP    Component Value Date/Time   PROBNP 92.0 11/21/2020 1557    Imaging: DG Chest 2 View  Result Date: 02/08/2021 CLINICAL DATA:  Cough and congestion. EXAM: CHEST - 2 VIEW COMPARISON:  12/12/2020. FINDINGS: Mediastinum and hilar structures normal. Cardiomegaly. Low lung volumes with mild bibasilar atelectasis. No pleural effusion or pneumothorax. No acute bony abnormality. IMPRESSION: 1.  Cardiomegaly.  No pulmonary venous congestion. 2.  Low lung volumes with mild bibasilar atelectasis. Electronically Signed   By: Marcello Moores  Register   On: 02/08/2021 09:00     Assessment & Plan:   Acute bronchitis with COPD ([place]) - Re-current COPD exacerbation. He has had a productive cough with purulent sputum x 5 days along with chest tightness and wheezing. He has been using SABA 3 times a day. On exam he had course wheezing throughout his lung fields. CXR showed no acute process; cardiomegaly without pulmonary vascular congestion. Bibasilar atelectasis. He received depo-medrol injection 78m IM x1 in office. We will send in RX for Omnicef 3061mBID x 7 days and prednisone taper (4070m 3 days, 38m26m3 day, 20mg34m days, 10mg 85mdays). He is due for regular 3 month follow-up in 1 month. We may want to consider adding DALIRESP in the future to help prevent exacerbations.    ElizabMartyn Ehrich/10/2020

## 2021-02-07 NOTE — Telephone Encounter (Signed)
Called and spoke with pt's daughter Anderson Malta and also spoke with pt. Pt has had complaints of productive cough, wheezing, and congestion x4 days.  Pt denies any complaints of fever. Pt is using Trelegy inhaler as prescribed, having to use rescue inhaler at least once or twice a day and is using his nebulizer at least once a day.  Pt and Anderson Malta are concerned about pt's symptoms due to his history of pna hoping to get this caught early before it does turn into pna.  Stated to both that we needed to see pt for an appt and we could order a cxr and they verbalized understanding.  Pt scheduled for an OV tomorrow 8/4 at 9am with [facility] and for pt to have cxr prior. Nothing further needed.

## 2021-02-08 ENCOUNTER — Encounter: Payer: Self-pay | Admitting: Primary Care

## 2021-02-08 ENCOUNTER — Other Ambulatory Visit: Payer: Self-pay

## 2021-02-08 ENCOUNTER — Ambulatory Visit (INDEPENDENT_AMBULATORY_CARE_PROVIDER_SITE_OTHER): Payer: Medicare Other | Admitting: Primary Care

## 2021-02-08 ENCOUNTER — Ambulatory Visit (INDEPENDENT_AMBULATORY_CARE_PROVIDER_SITE_OTHER): Payer: Medicare Other

## 2021-02-08 VITALS — BP 122/78 | HR 84 | Temp 97.6°F | Ht 67.0 in | Wt 212.4 lb

## 2021-02-08 DIAGNOSIS — J440 Chronic obstructive pulmonary disease with acute lower respiratory infection: Secondary | ICD-10-CM

## 2021-02-08 DIAGNOSIS — J9811 Atelectasis: Secondary | ICD-10-CM | POA: Diagnosis not present

## 2021-02-08 DIAGNOSIS — J209 Acute bronchitis, unspecified: Secondary | ICD-10-CM

## 2021-02-08 DIAGNOSIS — I517 Cardiomegaly: Secondary | ICD-10-CM | POA: Diagnosis not present

## 2021-02-08 DIAGNOSIS — R059 Cough, unspecified: Secondary | ICD-10-CM | POA: Diagnosis not present

## 2021-02-08 DIAGNOSIS — R062 Wheezing: Secondary | ICD-10-CM | POA: Diagnosis not present

## 2021-02-08 DIAGNOSIS — J441 Chronic obstructive pulmonary disease with (acute) exacerbation: Secondary | ICD-10-CM

## 2021-02-08 MED ORDER — METHYLPREDNISOLONE ACETATE 80 MG/ML IJ SUSP
80.0000 mg | Freq: Once | INTRAMUSCULAR | Status: AC
  Administered 2021-02-08: 80 mg via INTRAMUSCULAR

## 2021-02-08 MED ORDER — PREDNISONE 10 MG PO TABS: ORAL_TABLET | ORAL | 0 refills | Status: DC

## 2021-02-08 MED ORDER — CEFDINIR 300 MG PO CAPS: 300.0000 mg | ORAL_CAPSULE | Freq: Two times a day (BID) | ORAL | 0 refills | Status: DC

## 2021-02-08 NOTE — Assessment & Plan Note (Addendum)
-   Re-current COPD exacerbation. He has had a productive cough with purulent sputum x 5 days along with chest tightness and wheezing. He has been using SABA 3 times a day. On exam he had course wheezing throughout his lung fields. CXR showed no acute process; cardiomegaly without pulmonary vascular congestion. Bibasilar atelectasis. He received depo-medrol injection '80mg'$  IM x1 in office. We will send in RX for Omnicef '300mg'$  BID x 7 days and prednisone taper ('40mg'$  x 3 days, '30mg'$  x 3 day, '20mg'$  x 3 days, '10mg'$  x 3 days). He is due for regular 3 month follow-up in 1 month. We may want to consider adding DALIRESP in the future to help prevent exacerbations.

## 2021-02-08 NOTE — Patient Instructions (Addendum)
Nice seeing you today Thomas Hudson  CXR showed no evidence of pneumonia; cardiomegaly (enlarged heart) without pulmonary vascular congestion, atelectasis at lung bases   Recommendations: Continue Trelegy Use Albuterol inhaler or neb every 6 hours for breakthrough shortness of breath or wheezing Start Mucinex 600-'1200mg'$  twice a day as needed for congestion Continue Flonase nasal spray  Continue Lasix '40mg'$  daily   Office treatment: Depo-medrol IM injection today  Rx: Omnicef 1 tab twice a day x 7 days (you can still take doxycycline '50mg'$  daily for acne) Prednisone taper as directed   Follow-up: If symptoms do not improve or worsen

## 2021-02-14 DIAGNOSIS — M069 Rheumatoid arthritis, unspecified: Secondary | ICD-10-CM | POA: Diagnosis not present

## 2021-03-14 ENCOUNTER — Other Ambulatory Visit: Payer: Self-pay

## 2021-03-14 ENCOUNTER — Ambulatory Visit (INDEPENDENT_AMBULATORY_CARE_PROVIDER_SITE_OTHER): Payer: Medicare Other | Admitting: Primary Care

## 2021-03-14 ENCOUNTER — Encounter: Payer: Self-pay | Admitting: Primary Care

## 2021-03-14 DIAGNOSIS — J42 Unspecified chronic bronchitis: Secondary | ICD-10-CM

## 2021-03-14 MED ORDER — DALIRESP 250 MCG PO TABS: 250.0000 ug | ORAL_TABLET | Freq: Every day | ORAL | 0 refills | Status: DC

## 2021-03-14 MED ORDER — TRELEGY ELLIPTA 200-62.5-25 MCG/INH IN AEPB: 1.0000 | INHALATION_SPRAY | Freq: Every day | RESPIRATORY_TRACT | 0 refills | Status: DC

## 2021-03-14 MED ORDER — ALBUTEROL SULFATE (2.5 MG/3ML) 0.083% IN NEBU: 2.5000 mg | INHALATION_SOLUTION | Freq: Four times a day (QID) | RESPIRATORY_TRACT | 5 refills | Status: DC | PRN

## 2021-03-14 NOTE — Assessment & Plan Note (Addendum)
-   Chronic bronchitis with recurrent exacerbations. He has a chronic cough with wheezing. Trial increase Trelegy 210mg daily x 4 weeks and start Daliresp 2555m daily to prevent flare ups. FU televisit in 1 month to assess response.

## 2021-03-14 NOTE — Progress Notes (Signed)
'@Patient'  ID: Thomas Hudson, male    DOB: [birth year], 80 y.o.   MRN: [id number]  Chief Complaint  Patient presents with   Follow-up    Patient reports no concerns and is about the same.     Referring provider: [name], MD  HPI: 80 year old male. PMH significant for allergic rhinitis, dyspnea on exertion. Former patient of Dr. Melvyn Hudson. He is now following with Dr. Shearon Hudson. Maintained on Trelegy 154mg, Singulair, prn Ventolin hfa/albuterol neb.   Previous [facility] pulmonary encounter:  07/31/20- Dr. DShearon StallsHere for follow up today after PFTs and trial of albuterol. He never picked up albuterol because it was so expensive - cost him $115. Still sob with exertion. Occasional cough due to rhinitis.  I have reviewed the patient's family social and past medical history and updated as appropriate.  Suspected COPD, started on albuterol during last visit. PFTs in January showed no airflow limitation.   11/21/2020-Thomas Napoleon NP Patient presents today for acute visit. During last visit he was suspected to have COPD, given albuterol inhaler. PFTs in January 2022 showed no airflow limitation. [facility] for cough and left sided rib pain. Wheezing and rhonchi noted on exam. CXR suggested mild interstitial pulmonary edema. He was prescribed Augmentin and ipratropium-albuterol nebulzier for COPD exacerbation duoneb. He is taking antibiotic as prescribed, feel his cough has slightly improved but still has significant amount of wheezing. He has been using duoneb nebulizer twice a day. He ran out of zyrtec. He is compliant with flonase and Singulair. He is on 564mprednisone a day for arthritis. *Ok to leave message with lab results   12/12/20- Dr. DeShearon Stallsere for follow up after recent exacerbation. Worsening control of COPD over the last 6 months. Has been receiving steroid injections from PCP and our office. Also had two courses of prednisone in the last 6 months. Also had augmentin. Has completed  his recent prednisone taper. Back to 5 mg daily for his rheumatoid arthritis.   He is taking the albuterol inhaler 4 times/day.  It does seem to be helping his breathing.  Feels very short of breath during the day and fatigued. No fevers. No hemoptysis.   I have reviewed the patient's family social and past medical history and updated as appropriate.   02/08/2021 Patient presents today for acute visit/possible pneumonia. Ordered for stat CXR prior to visit today. He has been experiencing cough, wheezing and congestion x 4-5 days. Cough is occasionally productive with grey/green mucus. States that he would be outside working in the heat which exacerbated his symptoms. He is compliant with Trelegy 1004mand has been using Ventolin hfa 1-2 times a day along with his Albuterol nebulizer once a day. CXR today showed cardiomegaly, low lung volumes with bibasilar atelectasis. He was recently placed on lasix in the last couple of months by his PCP.   03/14/2021- interim hx  Patient presents today for 1 month follow-up/copd exacerbation. He was treated with course of omnicef and prednisone during his last office visit in August. He is doing well today, reports that his breathing is back to baseline. He has a chronic cough with clear mucus and intermittent wheezing. He is complaint with Trelegy 100m57maily.  Allergies  Allergen Reactions   Morphine And Related Other (See Comments)    Itching & rash Because of a history of documented adverse serious drug reaction;Medi Alert bracelet  is recommended    Immunization History  Administered Date(s) Administered  Fluad Quad(high Dose 65+) 05/12/2019, 05/12/2020   Influenza Split 06/25/2011, 04/15/2012   Influenza Whole 05/04/2009, 06/06/2010   Influenza, High Dose Seasonal PF 05/03/2013   Influenza,inj,Quad PF,6+ Mos 04/07/2014, 04/03/2015, 04/19/2016, 05/02/2017   Influenza-Unspecified 05/13/2012, 04/07/2014, 04/21/2018   PFIZER(Purple Top)SARS-COV-2  Vaccination 07/30/2019, 08/20/2019   Pneumococcal Conjugate-13 04/07/2014   Pneumococcal Polysaccharide-23 05/04/2009, 04/07/2014, 05/02/2017   Td 08/08/2010   Zoster, Live 08/10/2012    Past Medical History:  Diagnosis Date   BPH (benign prostatic hyperplasia)    Carpal tunnel syndrome on both sides    GERD (gastroesophageal reflux disease)    Hyperlipidemia    Hypertension    RAD (reactive airway disease)    no PMH of asthma; RAD post infection   Rheumatoid arthritis ([place])         Tobacco History: Social History   Tobacco Use  Smoking Status Former   Packs/day: 2.00   Years: 50.00   Pack years: 100.00   Types: Cigarettes   Quit date: 07/08/1997   Years since quitting: 23.6  Smokeless Tobacco Never   Counseling given: Not Answered   Outpatient Medications Prior to Visit  Medication Sig Dispense Refill   albuterol (VENTOLIN HFA) 108 (90 Base) MCG/ACT inhaler Inhale 2 puffs into the lungs every 6 (six) hours as needed for wheezing or shortness of breath. 3 each 3   alfuzosin (UROXATRAL) 10 MG 24 hr tablet Take 1 tablet (10 mg total) by mouth daily. TAKE 1 TABLET BY MOUTH DAILY 90 tablet 0   amLODipine (NORVASC) 5 MG tablet Take 1 tablet (5 mg total) by mouth daily. 90 tablet 3   aspirin-acetaminophen-caffeine (EXCEDRIN MIGRAINE) 250-250-65 MG tablet Take 2 tablets by mouth 3 (three) times daily as needed for headache.     cefdinir (OMNICEF) 300 MG capsule Take 1 capsule (300 mg total) by mouth 2 (two) times daily. 14 capsule 0   Certolizumab Pegol (CIMZIA) 2 X 200 MG KIT Inject 2 each into the skin every 30 (thirty) days.     cetirizine (ZYRTEC) 10 MG tablet Take 1 tablet (10 mg total) by mouth at bedtime. 90 tablet 3   doxycycline (MONODOX) 50 MG capsule      erythromycin ophthalmic ointment Place 1 application into both eyes daily as needed (dry eyes).     ferrous sulfate 325 (65 FE) MG tablet Take 1 tablet (325 mg total) by mouth 3 (three) times daily with meals. 270  tablet 3   finasteride (PROSCAR) 5 MG tablet Take 1 tablet (5 mg total) by mouth daily. 90 tablet 3   fluticasone (FLONASE) 50 MCG/ACT nasal spray Place 2 sprays into both nostrils daily as needed for allergies. 48 g 3   Fluticasone-Umeclidin-Vilant (TRELEGY ELLIPTA) 100-62.5-25 MCG/INH AEPB Inhale 1 puff into the lungs daily. 3 each 3   folic acid (FOLVITE) 1 MG tablet Take 1 mg by mouth daily.   3   furosemide (LASIX) 40 MG tablet Take 1 tablet (40 mg total) by mouth daily as needed. For increased leg swelling 30 tablet 1   gabapentin (NEURONTIN) 300 MG capsule Take 300-600 mg by mouth See admin instructions. Take 1 capsule (300 mg) by mouth in the morning & take 2 capsules (600 mg) by mouth at night.     losartan (COZAAR) 100 MG tablet Take 1 tablet (100 mg total) by mouth daily. 90 tablet 3   montelukast (SINGULAIR) 10 MG tablet Take 1 tablet (10 mg total) by mouth at bedtime. 90 tablet 3   Multiple  Vitamin (MULTIVITAMIN WITH MINERALS) TABS tablet Take 1 tablet by mouth daily.     omeprazole (PRILOSEC) 20 MG capsule Take 1 capsule (20 mg total) by mouth daily. 90 capsule 3   potassium chloride SA (KLOR-CON) 20 MEQ tablet Take 1 tablet (20 mEq total) by mouth daily. 90 tablet 3   predniSONE (DELTASONE) 5 MG tablet Take 5 mg by mouth See admin instructions. Take 1 tablet daily     albuterol (PROVENTIL) (2.5 MG/3ML) 0.083% nebulizer solution Take 3 mLs (2.5 mg total) by nebulization every 6 (six) hours as needed for wheezing or shortness of breath. 75 mL 12   rosuvastatin (CRESTOR) 10 MG tablet Take 1 tablet (10 mg total) by mouth daily. Please make overdue appt with Dr. Acie Fredrickson before anymore refills. Thank you 1st attempt 30 tablet 0   predniSONE (DELTASONE) 10 MG tablet Take 4 tabs po daily x 3 days; then 3 tabs daily x3 days; then 2 tabs daily x3 days; then 1 tab daily x 3 days; then stop (Patient not taking: Reported on 03/14/2021) 30 tablet 0   No facility-administered medications prior to  visit.    Review of Systems  Review of Systems  Constitutional: Negative.  Negative for diaphoresis, fatigue and fever.  HENT: Negative.    Respiratory:  Positive for cough and wheezing. Negative for shortness of breath.   Cardiovascular: Negative.     Physical Exam  BP 126/78 (BP Location: Left Arm, Patient Position: Sitting, Cuff Size: Large)   Pulse 88   Temp 97.8 F (36.6 C) (Oral)   Ht '5\' 7"'  (1.702 m)   Wt 209 lb 6.4 oz (95 kg)   SpO2 98%   BMI 32.80 kg/m  Physical Exam Constitutional:      Appearance: Normal appearance.  HENT:     Head: Normocephalic and atraumatic.  Cardiovascular:     Rate and Rhythm: Normal rate and regular rhythm.  Pulmonary:     Effort: Pulmonary effort is normal.     Breath sounds: Wheezing present.  Musculoskeletal:        General: Normal range of motion.  Skin:    General: Skin is warm and dry.  Neurological:     General: No focal deficit present.     Mental Status: He is alert and oriented to person, place, and time. Mental status is at baseline.     Lab Results:  CBC    Component Value Date/Time   WBC 4.9 11/21/2020 1557   RBC 4.49 11/21/2020 1557   HGB 13.7 11/21/2020 1557   HGB 13.9 02/20/2017 0951   HCT 40.1 11/21/2020 1557   HCT 41.5 02/20/2017 0951   PLT 179.0 11/21/2020 1557   PLT 222 02/20/2017 0951   MCV 89.3 11/21/2020 1557   MCV 90.4 02/20/2017 0951   MCH 30.3 02/20/2017 0951   MCHC 34.2 11/21/2020 1557   RDW 13.9 11/21/2020 1557   RDW 14.7 (H) 02/20/2017 0951   LYMPHSABS 0.5 (L) 11/21/2020 1557   LYMPHSABS 0.7 (L) 02/20/2017 0951   MONOABS 0.6 11/21/2020 1557   MONOABS 0.5 02/20/2017 0951   EOSABS 0.1 11/21/2020 1557   EOSABS 0.2 02/20/2017 0951   BASOSABS 0.0 11/21/2020 1557   BASOSABS 0.0 02/20/2017 0951    BMET    Component Value Date/Time   NA 140 11/21/2020 1557   NA 141 08/06/2019 1356   K 4.0 11/21/2020 1557   CL 109 11/21/2020 1557   CO2 24 11/21/2020 1557   GLUCOSE 119 (H) 11/21/2020  1557   BUN 11 11/21/2020 1557   BUN 12 08/06/2019 1356   CREATININE 1.09 11/21/2020 1557   CALCIUM 8.6 11/21/2020 1557   GFRNONAA 63 08/06/2019 1356   GFRAA 73 08/06/2019 1356    BNP No results found for: BNP  ProBNP    Component Value Date/Time   PROBNP 92.0 11/21/2020 1557    Imaging: No results found.   Assessment & Plan:   COPD (chronic obstructive pulmonary disease) (HCC) - Chronic bronchitis with recurrent exacerbations. He has a chronic cough with wheezing. Trial increase Trelegy 266mg daily x 4 weeks and start Daliresp 2575m daily to prevent flare ups. FU televisit in 1 month to assess response.      ElMartyn EhrichNP 03/14/2021

## 2021-03-14 NOTE — Patient Instructions (Addendum)
Recommendations: - Stop Trelegy 168mg daily - Start Trelegy 2035m daily until follow-up  - Start Daliresp 25077mx 4 weeks; then increase to 500m60mf tolerating and stay on this dose  Rx: Daliresp  Albuterol nebulizer  Follow-up: 4 week televisit with BethEustaquio Maize(call cellphone)

## 2021-03-15 ENCOUNTER — Telehealth: Payer: Self-pay | Admitting: Primary Care

## 2021-03-15 NOTE — Telephone Encounter (Signed)
error 

## 2021-03-16 DIAGNOSIS — M069 Rheumatoid arthritis, unspecified: Secondary | ICD-10-CM | POA: Diagnosis not present

## 2021-03-27 DIAGNOSIS — R351 Nocturia: Secondary | ICD-10-CM | POA: Diagnosis not present

## 2021-03-27 DIAGNOSIS — N403 Nodular prostate with lower urinary tract symptoms: Secondary | ICD-10-CM | POA: Diagnosis not present

## 2021-03-28 DIAGNOSIS — M542 Cervicalgia: Secondary | ICD-10-CM | POA: Diagnosis not present

## 2021-03-28 DIAGNOSIS — Z79899 Other long term (current) drug therapy: Secondary | ICD-10-CM | POA: Diagnosis not present

## 2021-03-28 DIAGNOSIS — M1990 Unspecified osteoarthritis, unspecified site: Secondary | ICD-10-CM | POA: Diagnosis not present

## 2021-03-28 DIAGNOSIS — M150 Primary generalized (osteo)arthritis: Secondary | ICD-10-CM | POA: Diagnosis not present

## 2021-03-28 DIAGNOSIS — Z23 Encounter for immunization: Secondary | ICD-10-CM | POA: Diagnosis not present

## 2021-03-28 DIAGNOSIS — M5136 Other intervertebral disc degeneration, lumbar region: Secondary | ICD-10-CM | POA: Diagnosis not present

## 2021-03-28 DIAGNOSIS — M0600 Rheumatoid arthritis without rheumatoid factor, unspecified site: Secondary | ICD-10-CM | POA: Diagnosis not present

## 2021-03-30 ENCOUNTER — Other Ambulatory Visit: Payer: Self-pay | Admitting: Internal Medicine

## 2021-04-10 NOTE — Progress Notes (Signed)
Virtual Visit via Telephone Note  I connected with Thomas Hudson on 04/10/21 at  9:30 AM EDT by telephone and verified that I am speaking with the correct person using two identifiers.  Location: Patient: Home Provider: Office    I discussed the limitations, risks, security and privacy concerns of performing an evaluation and management service by telephone and the availability of in person appointments. I also discussed with the patient that there may be a patient responsible charge related to this service. The patient expressed understanding and agreed to proceed.   History of Present Illness: 80 year old male. PMH significant for allergic rhinitis, dyspnea on exertion. Former patient of [name]. He is now following with [name]. Maintained on Trelegy 130mcg, Singulair, prn Ventolin hfa/albuterol neb.   Previous [facility] pulmonary encounter:  12/12/20- [name] Here for follow up after recent exacerbation. Worsening control of COPD over the last 6 months. Has been receiving steroid injections from PCP and our office. Also had two courses of prednisone in the last 6 months. Also had augmentin. Has completed his recent prednisone taper. Back to 5 mg daily for his rheumatoid arthritis.   He is taking the albuterol inhaler 4 times/day.  It does seem to be helping his breathing.  Feels very short of breath during the day and fatigued. No fevers. No hemoptysis.   I have reviewed the patient's family social and past medical history and updated as appropriate.   02/08/2021 Patient presents today for acute visit/possible pneumonia. Ordered for stat CXR prior to visit today. He has been experiencing cough, wheezing and congestion x 4-5 days. Cough is occasionally productive with grey/green mucus. States that he would be outside working in the heat which exacerbated his symptoms. He is compliant with Trelegy 144mcg and has been using Ventolin hfa 1-2 times a day along with his Albuterol nebulizer once a  day. CXR today showed cardiomegaly, low lung volumes with bibasilar atelectasis. He was recently placed on lasix in the last couple of months by his PCP.   03/14/2021 Patient presents today for 1 month follow-up/copd exacerbation. He was treated with course of omnicef and prednisone during his last office visit in August. He is doing well today, reports that his breathing is back to baseline. He has a chronic cough with clear mucus and intermittent wheezing. He is complaint with Trelegy 176mcg daily.   04/11/2021- Interim hx  Patient contacted today for 1 month follow-up. At his last visit we increased Trelegy to 227mcg and added on Dailresp 243mcg daily to help prevent COPD flare ups. He feels amount the same as last visit. He is still coughing. He currently complains of nasal congestion, he has not tried any over the counter medication. He did not notice any significant change in his breathing on Trelegy 226mcg. He is tolerating Daliresp medication.     Observations/Objective:  - Able to speak in full sentences; No overt shortness of breath or wheezing  Assessment and Plan:  COPD: - Chronic bronchitis with recurrent exacerbations. Last exacerbation was in August 2022. No significant improvement in breathing in higher dose ICS/LABA/LAMA. Started on Daliresp 242mcg in September 2022. Tolerating medication, plan increased Daliresp to 581mcg daily. Resume Trelegy 156mcg daily. Follow-up in 2-3 months with [name] in office.   Follow Up Instructions:  - Dec 13th 9am with [name]    I discussed the assessment and treatment plan with the patient. The patient was provided an opportunity to ask questions and all were answered.  The patient agreed with the plan and demonstrated an understanding of the instructions.   The patient was advised to call back or seek an in-person evaluation if the symptoms worsen or if the condition fails to improve as anticipated.  I provided 25 minutes of  non-face-to-face time during this encounter.   Martyn Ehrich, NP

## 2021-04-11 ENCOUNTER — Encounter: Payer: Self-pay | Admitting: Primary Care

## 2021-04-11 ENCOUNTER — Other Ambulatory Visit: Payer: Self-pay

## 2021-04-11 ENCOUNTER — Ambulatory Visit (INDEPENDENT_AMBULATORY_CARE_PROVIDER_SITE_OTHER): Payer: Medicare Other | Admitting: Primary Care

## 2021-04-11 DIAGNOSIS — J449 Chronic obstructive pulmonary disease, unspecified: Secondary | ICD-10-CM

## 2021-04-11 MED ORDER — ROFLUMILAST 500 MCG PO TABS: 500.0000 ug | ORAL_TABLET | Freq: Every day | ORAL | 3 refills | Status: DC

## 2021-04-13 DIAGNOSIS — M0600 Rheumatoid arthritis without rheumatoid factor, unspecified site: Secondary | ICD-10-CM | POA: Diagnosis not present

## 2021-05-03 DIAGNOSIS — Z23 Encounter for immunization: Secondary | ICD-10-CM | POA: Diagnosis not present

## 2021-05-15 ENCOUNTER — Ambulatory Visit (INDEPENDENT_AMBULATORY_CARE_PROVIDER_SITE_OTHER): Payer: Medicare Other | Admitting: Internal Medicine

## 2021-05-15 ENCOUNTER — Other Ambulatory Visit: Payer: Self-pay

## 2021-05-15 ENCOUNTER — Encounter: Payer: Self-pay | Admitting: Internal Medicine

## 2021-05-15 VITALS — BP 138/76 | HR 69 | Temp 98.1°F | Ht 66.5 in | Wt 208.0 lb

## 2021-05-15 DIAGNOSIS — M06049 Rheumatoid arthritis without rheumatoid factor, unspecified hand: Secondary | ICD-10-CM | POA: Diagnosis not present

## 2021-05-15 DIAGNOSIS — J449 Chronic obstructive pulmonary disease, unspecified: Secondary | ICD-10-CM

## 2021-05-15 DIAGNOSIS — I10 Essential (primary) hypertension: Secondary | ICD-10-CM | POA: Diagnosis not present

## 2021-05-15 DIAGNOSIS — I872 Venous insufficiency (chronic) (peripheral): Secondary | ICD-10-CM | POA: Diagnosis not present

## 2021-05-15 DIAGNOSIS — Z0000 Encounter for general adult medical examination without abnormal findings: Secondary | ICD-10-CM | POA: Diagnosis not present

## 2021-05-15 DIAGNOSIS — D519 Vitamin B12 deficiency anemia, unspecified: Secondary | ICD-10-CM | POA: Diagnosis not present

## 2021-05-15 LAB — CBC
HCT: 44.4 % (ref 39.0–52.0)
Hemoglobin: 14.7 g/dL (ref 13.0–17.0)
MCHC: 33 g/dL (ref 30.0–36.0)
MCV: 90.9 fl (ref 78.0–100.0)
Platelets: 239 10*3/uL (ref 150.0–400.0)
RBC: 4.89 Mil/uL (ref 4.22–5.81)
RDW: 13.4 % (ref 11.5–15.5)
WBC: 6 10*3/uL (ref 4.0–10.5)

## 2021-05-15 LAB — HEPATIC FUNCTION PANEL
ALT: 16 U/L (ref 0–53)
AST: 16 U/L (ref 0–37)
Albumin: 4 g/dL (ref 3.5–5.2)
Alkaline Phosphatase: 63 U/L (ref 39–117)
Bilirubin, Direct: 0.1 mg/dL (ref 0.0–0.3)
Total Bilirubin: 0.9 mg/dL (ref 0.2–1.2)
Total Protein: 6.3 g/dL (ref 6.0–8.3)

## 2021-05-15 LAB — RENAL FUNCTION PANEL
Albumin: 4 g/dL (ref 3.5–5.2)
BUN: 9 mg/dL (ref 6–23)
CO2: 27 mEq/L (ref 19–32)
Calcium: 9.1 mg/dL (ref 8.4–10.5)
Chloride: 103 mEq/L (ref 96–112)
Creatinine, Ser: 1.11 mg/dL (ref 0.40–1.50)
GFR: 63.67 mL/min (ref >60.00)
Glucose, Bld: 115 mg/dL — ABNORMAL HIGH (ref 70–99)
Phosphorus: 2.5 mg/dL (ref 2.3–4.6)
Potassium: 3.9 mEq/L (ref 3.5–5.1)
Sodium: 138 mEq/L (ref 135–145)

## 2021-05-15 LAB — T4, FREE: Free T4: 0.78 ng/dL (ref 0.60–1.60)

## 2021-05-15 LAB — VITAMIN B12: Vitamin B-12: 295 pg/mL (ref 211–911)

## 2021-05-15 MED ORDER — FUROSEMIDE 40 MG PO TABS
40.0000 mg | ORAL_TABLET | Freq: Every day | ORAL | 3 refills | Status: DC | PRN
Start: 2021-05-15 — End: 2021-07-05

## 2021-05-15 NOTE — Assessment & Plan Note (Signed)
I have personally reviewed the Medicare Annual Wellness questionnaire and have noted 1. The patient's medical and social history 2. Their use of alcohol, tobacco or illicit drugs 3. Their current medications and supplements 4. The patient's functional ability including ADL's, fall risks, home safety risks and hearing or visual             impairment. 5. Diet and physical activities 6. Evidence for depression or mood disorders  The patients weight, height, BMI and visual acuity have been recorded in the chart I have made referrals, counseling and provided education to the patient based review of the above and I have provided the pt with a written personalized care plan for preventive services.  I have provided you with a copy of your personalized plan for preventive services. Please take the time to review along with your updated medication list.  Done with cancer screening Had bivalent COVID and flu vaccines Not able to exercise Consider shingrix when covered

## 2021-05-15 NOTE — Assessment & Plan Note (Signed)
Controlled with the cimzia and prednisone

## 2021-05-15 NOTE — Progress Notes (Addendum)
Subjective:    Patient ID: Thomas Hudson, male    DOB: 26-Dec-1940, 80 y.o.   MRN: [id number]  HPI Here for Medicare wellness visit and follow up of chronic health conditions This visit occurred during the SARS-CoV-2 public health emergency.  Safety protocols were in place, including screening questions prior to the visit, additional usage of staff PPE, and extensive cleaning of exam room while observing appropriate contact time as indicated for disinfecting solutions.   Reviewed advanced directives Reviewed other doctors---Dr Desai---pulmonary, Dr Syed--rheumatology, Dr Kathrine Cords, Dr Nahser--cardiology, Dr Christophe Louis, Dr Darrold Span, Dr. Eskridge--urology, Dr Bell---dentist, Dr Burnard Leigh No hospitalizations or surgery in the past year No alcohol or tobacco Vision is okay Hearing is poor--but not ready to get aides Not able to exercise---limited by foot pain No falls No depression or anhedonia Can do instrumental ADLs---just hard to bend No sig memory issues  Having trouble with swelling in his legs Ran out of the furosemide---[facility] didn't fill (had been taking daily) Didn't do well off them (ran out 2 days ago) Breathing is "rough" lately Now seeing pulmonary--on trelegy which has helped wheeing Not as much cough--but brings up phlegm daily Still with easy DOE---no recent change  Having tingling in feet and toes Toes feel cold Taking gabapentin-- 300/600  Hasn't seen ortho in a while No back pain recently---while on the gabapentin  Still on cimzia and prednisone Working fairly well----hands/ankles/neck----RA  No chest pain No palpitations No dizziness or syncope  Urinary stream has slowed down in past 2 weeks--especially early AM Does empty okay  Has intermittent pain around umbilical hernia Not today  Current Outpatient Medications on File Prior to Visit  Medication Sig Dispense Refill   albuterol (PROVENTIL) (2.5 MG/3ML) 0.083% nebulizer  solution Take 3 mLs (2.5 mg total) by nebulization every 6 (six) hours as needed for wheezing or shortness of breath. 120 mL 5   albuterol (VENTOLIN HFA) 108 (90 Base) MCG/ACT inhaler Inhale 2 puffs into the lungs every 6 (six) hours as needed for wheezing or shortness of breath. 3 each 3   alfuzosin (UROXATRAL) 10 MG 24 hr tablet TAKE 1 TABLET(10 MG) BY MOUTH DAILY 90 tablet 3   amLODipine (NORVASC) 5 MG tablet Take 1 tablet (5 mg total) by mouth daily. 90 tablet 3   aspirin-acetaminophen-caffeine (EXCEDRIN MIGRAINE) 250-250-65 MG tablet Take 2 tablets by mouth 3 (three) times daily as needed for headache.     Certolizumab Pegol (CIMZIA) 2 X 200 MG KIT Inject 2 each into the skin every 30 (thirty) days.     cetirizine (ZYRTEC) 10 MG tablet Take 1 tablet (10 mg total) by mouth at bedtime. 90 tablet 3   doxycycline (MONODOX) 50 MG capsule      erythromycin ophthalmic ointment Place 1 application into both eyes daily as needed (dry eyes).     ferrous sulfate 325 (65 FE) MG tablet Take 1 tablet (325 mg total) by mouth 3 (three) times daily with meals. 270 tablet 3   finasteride (PROSCAR) 5 MG tablet Take 1 tablet (5 mg total) by mouth daily. 90 tablet 3   fluticasone (FLONASE) 50 MCG/ACT nasal spray Place 2 sprays into both nostrils daily as needed for allergies. 48 g 3   Fluticasone-Umeclidin-Vilant (TRELEGY ELLIPTA) 100-62.5-25 MCG/ACT AEPB Inhale 1 Inhaler into the lungs daily.     folic acid (FOLVITE) 1 MG tablet Take 1 mg by mouth daily.   3   gabapentin (NEURONTIN) 300 MG capsule Take 300-600 mg by mouth See admin  instructions. Take 1 capsule (300 mg) by mouth in the morning & take 2 capsules (600 mg) by mouth at night.     losartan (COZAAR) 100 MG tablet Take 1 tablet (100 mg total) by mouth daily. 90 tablet 3   montelukast (SINGULAIR) 10 MG tablet Take 1 tablet (10 mg total) by mouth at bedtime. 90 tablet 3   Multiple Vitamin (MULTIVITAMIN WITH MINERALS) TABS tablet Take 1 tablet by mouth  daily.     omeprazole (PRILOSEC) 20 MG capsule Take 1 capsule (20 mg total) by mouth daily. 90 capsule 3   potassium chloride SA (KLOR-CON) 20 MEQ tablet Take 1 tablet (20 mEq total) by mouth daily. 90 tablet 3   predniSONE (DELTASONE) 5 MG tablet Take 5 mg by mouth See admin instructions. Take 1 tablet daily     roflumilast (DALIRESP) 500 MCG TABS tablet Take 1 tablet (500 mcg total) by mouth daily. 90 tablet 3   No current facility-administered medications on file prior to visit.    Allergies  Allergen Reactions   Morphine And Related Other (See Comments)    Itching & rash Because of a history of documented adverse serious drug reaction;Medi Alert bracelet  is recommended    Past Medical History:  Diagnosis Date   BPH (benign prostatic hyperplasia)    Carpal tunnel syndrome on both sides    GERD (gastroesophageal reflux disease)    Hyperlipidemia    Hypertension    RAD (reactive airway disease)    no PMH of asthma; RAD post infection   Rheumatoid arthritis ([place])         Past Surgical History:  Procedure Laterality Date   BIOPSY  01/06/2018   Procedure: BIOPSY;  Surgeon: Irene Shipper, MD;  Location: [facility];  Service: Endoscopy;;   BIOPSY  01/03/2020   Procedure: BIOPSY;  Surgeon: Irene Shipper, MD;  Location: [facility];  Service: Endoscopy;;   COLONOSCOPY W/ POLYPECTOMY  2009   X 2; Dr.Perry    ERCP N/A 11/05/2016   Procedure: ENDOSCOPIC RETROGRADE CHOLANGIOPANCREATOGRAPHY (ERCP);  Surgeon: Irene Shipper, MD;  Location[facility];  Service: Endoscopy;  Laterality: N/A;   ESOPHAGOGASTRODUODENOSCOPY (EGD) WITH PROPOFOL N/A 07/29/2016   Procedure: ESOPHAGOGASTRODUODENOSCOPY (EGD) WITH PROPOFOL;  Surgeon: Irene Shipper, MD;  [facility]: [facility];  Service: Endoscopy;  Laterality: N/A;  will need ERCP scope   ESOPHAGOGASTRODUODENOSCOPY (EGD) WITH PROPOFOL N/A 01/06/2018   Procedure: ESOPHAGOGASTRODUODENOSCOPY (EGD) WITH PROPOFOL;  Surgeon: Irene Shipper, MD;  [facility];  Service: Endoscopy;  Laterality: N/A;  NEED ERCP SCOPE   ESOPHAGOGASTRODUODENOSCOPY (EGD) WITH PROPOFOL N/A 01/03/2020   Procedure: ESOPHAGOGASTRODUODENOSCOPY (EGD) WITH PROPOFOL;  Surgeon: Irene Shipper, MD;  [facility]: [facility];  Service: Endoscopy;  Laterality: N/A;  Needs ERCP scope   HERNIA REPAIR     LUMBAR LAMINECTOMY  07/2008   UPPER GASTROINTESTINAL ENDOSCOPY      gastric polyp;Dr.Perry    VASECTOMY     WHIPPLE PROCEDURE  2012   partial ; [facility]    Family History  Problem Relation Age of Onset   Throat cancer Father        smoker   Esophageal cancer Father    Prostate cancer Paternal Uncle    Heart attack Paternal Uncle        3 uncles, >55   Coronary artery disease Brother        in 76s   Esophageal cancer Brother    Skin cancer Mother    Throat  cancer Brother    Heart disease Brother    Uterine cancer Sister    COPD Sister        X2   Ovarian cancer Sister    Stroke Paternal Grandmother 53   Colon cancer Paternal Grandfather    Heart disease Paternal Grandfather    Heart disease Maternal Grandfather    Throat cancer Sister        smoker   Breast cancer Sister    Lung cancer Sister        smoker   Leukemia Sister    Heart disease Brother    Asthma Neg Hx     Social History   Socioeconomic History   Marital status: Married    Spouse name: Not on file   Number of children: 2   Years of education: Not on file   Highest education level: Not on file  Occupational History   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[employer]    Comment: Retired  Tobacco Use   Smoking status: Former    Packs/day: 2.00    Years: 50.00    Pack years: 100.00    Types: Cigarettes    Quit date: 07/08/1997    Years since quitting: 23.8   Smokeless tobacco: Never  Substance and Sexual Activity   Alcohol use: No    Alcohol/week: 0.0 standard drinks   Drug use: No   Sexual activity: Yes  Other Topics Concern   Not on file  Social History Narrative   Has living  will   Requests daughters as health care POA   Would accept resuscitation attempts   Not sure about tube feeds   Social Determinants of Health   Financial Resource Strain: Not on file  Food Insecurity: Not on file  Transportation Needs: Not on file  Physical Activity: Not on file  Stress: Not on file  Social Connections: Not on file  Intimate Partner Violence: Not on file   Review of Systems Appetite is good Weight up 10# since last year Sleeps 4 hours per night---not really tired in the day (occ naps) On doxycycline for acne on head---keeps it quiet Wears seat belt Sore gums now---has lost several teeth. Implant didn't work No heartburn on the prilosec. Has symptoms if he misses. Some dysphagia--pills often (get hung in throat) Bowels are slow lately--no meds lately though    Objective:   Physical Exam Constitutional:      Appearance: Normal appearance.  HENT:     Mouth/Throat:     Comments: No lesions Eyes:     Conjunctiva/sclera: Conjunctivae normal.     Pupils: Pupils are equal, round, and reactive to light.  Cardiovascular:     Rate and Rhythm: Normal rate and regular rhythm.     Pulses: Normal pulses.     Heart sounds: No murmur heard.   No gallop.  Pulmonary:     Effort: Pulmonary effort is normal.     Breath sounds: Normal breath sounds. No wheezing or rales.     Comments: Dyspnea getting up and down from table--not new Abdominal:     Palpations: Abdomen is soft.     Tenderness: There is no abdominal tenderness.     Comments: Small reducible umbilical hernia  Musculoskeletal:     Cervical back: Neck supple.     Comments: Slight calf edema bilaterally  Lymphadenopathy:     Cervical: No cervical adenopathy.  Skin:    Findings: No lesion or rash.  Neurological:  Mental Status: He is alert and oriented to person, place, and time.     Comments: Shon Millet, Obama" [phone]  counts out on fingers D-l-o-r-w Recall 3/3   Psychiatric:        Mood and Affect: Mood normal.        Behavior: Behavior normal.           Assessment & Plan:

## 2021-05-15 NOTE — Progress Notes (Signed)
Hearing Screening - Comments:: Did not pass whisper test Vision Screening - Comments:: June 2022

## 2021-05-15 NOTE — Assessment & Plan Note (Signed)
Chronic DOE On trelegy and prn albuterol

## 2021-05-15 NOTE — Assessment & Plan Note (Signed)
BP Readings from Last 3 Encounters:  05/15/21 138/76  03/14/21 126/78  02/08/21 122/78   Good control on losartan

## 2021-05-15 NOTE — Assessment & Plan Note (Signed)
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

## 2021-05-15 NOTE — Assessment & Plan Note (Signed)
Will refill furosemide for prn use

## 2021-06-05 DIAGNOSIS — M069 Rheumatoid arthritis, unspecified: Secondary | ICD-10-CM | POA: Diagnosis not present

## 2021-06-19 ENCOUNTER — Ambulatory Visit (INDEPENDENT_AMBULATORY_CARE_PROVIDER_SITE_OTHER): Payer: Medicare Other | Admitting: Internal Medicine

## 2021-06-19 ENCOUNTER — Other Ambulatory Visit: Payer: Self-pay

## 2021-06-19 ENCOUNTER — Encounter: Payer: Self-pay | Admitting: Internal Medicine

## 2021-06-19 VITALS — BP 138/82 | HR 89 | Ht 67.0 in | Wt 203.6 lb

## 2021-06-19 DIAGNOSIS — J309 Allergic rhinitis, unspecified: Secondary | ICD-10-CM

## 2021-06-19 DIAGNOSIS — Z87891 Personal history of nicotine dependence: Secondary | ICD-10-CM | POA: Diagnosis not present

## 2021-06-19 DIAGNOSIS — J449 Chronic obstructive pulmonary disease, unspecified: Secondary | ICD-10-CM | POA: Diagnosis not present

## 2021-06-19 MED ORDER — BREZTRI AEROSPHERE 160-9-4.8 MCG/ACT IN AERO: 2.0000 | INHALATION_SPRAY | Freq: Two times a day (BID) | RESPIRATORY_TRACT | 0 refills | Status: DC

## 2021-06-19 MED ORDER — BREZTRI AEROSPHERE 160-9-4.8 MCG/ACT IN AERO: 2.0000 | INHALATION_SPRAY | Freq: Two times a day (BID) | RESPIRATORY_TRACT | 3 refills | Status: AC

## 2021-06-19 MED ORDER — AZELASTINE HCL 0.1 % NA SOLN: 1.0000 | Freq: Two times a day (BID) | NASAL | 3 refills | Status: DC

## 2021-06-19 MED ORDER — ALBUTEROL SULFATE (2.5 MG/3ML) 0.083% IN NEBU: 2.5000 mg | INHALATION_SOLUTION | Freq: Four times a day (QID) | RESPIRATORY_TRACT | 3 refills | Status: DC | PRN

## 2021-06-19 NOTE — Patient Instructions (Addendum)
Please schedule follow up scheduled with myself in 3 months.  If my schedule is not open yet, we will contact you with a reminder closer to that time. Please call [phone] if you haven't heard from [place] a month before.    Stop Trelegy. Switch to [employer]. 2 puffs in the morning, 2 puffs at night, gargle after use.  For your nasal drainage - keep taking zyrtec, flonase, and singulair. I am adding another nasal spray called astelin which should help.   I have sent 90 day refills for all your medications to [place].

## 2021-06-19 NOTE — Progress Notes (Signed)
Thomas Hudson    [id number]    1940/07/18  Primary Care Physician:Letvak, Theophilus Kinds, MD Date of Appointment: 06/19/2021 Established Patient Visit  Chief complaint:   Chief Complaint  Patient presents with   Follow-up    SOB has been the same     HPI: Thomas Hudson is a 80 y.o. gentleman with history of allergic rhinitis who presented Nov 2021 with shortness of breath and was started on albuterol prn. Diagnosed with COPD. Symptom onset was around 2009, no childhood respiratory disease or asthma.   Interval Updates: Here for follow up after recent exacerbation. Worsening COPD control in the last year with several prednisone courses. Saw [facility] twice over the last 6 months for COPD exacerbations. Started on daliresp - has not percieved a difference  Now on 5 mg prednisone for his RA. Taking infusion therapy for his RA at [facility] - sees Dr. Dossie Der.    Takes albuterol inhaler or nebulizer every other day.   Social history: 100 pack year history  I have reviewed the patient's family social and past medical history and updated as appropriate.   Past Medical History:  Diagnosis Date   BPH (benign prostatic hyperplasia)    Carpal tunnel syndrome on both sides    GERD (gastroesophageal reflux disease)    Hyperlipidemia    Hypertension    RAD (reactive airway disease)    no PMH of asthma; RAD post infection   Rheumatoid arthritis [facility])         Past Surgical History:  Procedure Laterality Date   BIOPSY  01/06/2018   Procedure: BIOPSY;  Surgeon: Irene Shipper, MD;  [facility];  Service: Endoscopy;;   BIOPSY  01/03/2020   Procedure: BIOPSY;  Surgeon: Irene Shipper, MD;  [facility];  Service: Endoscopy;;   COLONOSCOPY W/ POLYPECTOMY  2009   X 2; Dr.Perry    ERCP N/A 11/05/2016   Procedure: ENDOSCOPIC RETROGRADE CHOLANGIOPANCREATOGRAPHY (ERCP);  Surgeon: Irene Shipper, MD;  Location: [facility] ENDOSCOPY;  Service: Endoscopy;  Laterality: N/A;    ESOPHAGOGASTRODUODENOSCOPY (EGD) WITH PROPOFOL N/A 07/29/2016   Procedure: ESOPHAGOGASTRODUODENOSCOPY (EGD) WITH PROPOFOL;  Surgeon: Irene Shipper, MD;  [facility];  Service: Endoscopy;  Laterality: N/A;  will need ERCP scope   ESOPHAGOGASTRODUODENOSCOPY (EGD) WITH PROPOFOL N/A 01/06/2018   Procedure: ESOPHAGOGASTRODUODENOSCOPY (EGD) WITH PROPOFOL;  Surgeon: Irene Shipper, MD;  [facility];  Service: Endoscopy;  Laterality: N/A;  NEED ERCP SCOPE   ESOPHAGOGASTRODUODENOSCOPY (EGD) WITH PROPOFOL N/A 01/03/2020   Procedure: ESOPHAGOGASTRODUODENOSCOPY (EGD) WITH PROPOFOL;  Surgeon: Irene Shipper, MD;  [facility];  Service: Endoscopy;  Laterality: N/A;  Needs ERCP scope   HERNIA REPAIR     LUMBAR LAMINECTOMY  07/2008   UPPER GASTROINTESTINAL ENDOSCOPY      gastric polyp;Dr.Perry    VASECTOMY     WHIPPLE PROCEDURE  2012   partial ; [facility]    Family History  Problem Relation Age of Onset   Throat cancer Father        smoker   Esophageal cancer Father    Prostate cancer Paternal Uncle    Heart attack Paternal Uncle        3 uncles, >55   Coronary artery disease Brother        in 68s   Esophageal cancer Brother    Skin cancer Mother    Throat cancer Brother    Heart disease Brother  Uterine cancer Sister    COPD Sister        X2   Ovarian cancer Sister    Stroke Paternal Grandmother 59   Colon cancer Paternal Grandfather    Heart disease Paternal Grandfather    Heart disease Maternal Grandfather    Throat cancer Sister        smoker   Breast cancer Sister    Lung cancer Sister        smoker   Leukemia Sister    Heart disease Brother    Asthma Neg Hx     Social History   Occupational History   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  Gen:     No acute distress Lungs:   diminished bilaterally no wheezes or crackles CV:        RRR, no mrg   Data Reviewed: Imaging: I have personally reviewed the CT Chest Cardiac with limited lung windows in Feb 2021 - lung windows available are clear without nodules of effusion.   Chest xray obtained 02/08/21 personally reviewed today - no acute process  PFTs: Mild restriction to ventilation Jan 2022  Labs: Lab Results  Component Value Date   WBC 6.0 05/15/2021   HGB 14.7 05/15/2021   HCT 44.4 05/15/2021   MCV 90.9 05/15/2021   PLT 239.0 05/15/2021   Lab Results  Component Value Date   NA 138 05/15/2021   K 3.9 05/15/2021   CL 103 05/15/2021   CO2 27 05/15/2021     Immunization status: Immunization History  Administered Date(s) Administered   Fluad Quad(high Dose 65+) 05/12/2019, 05/12/2020   Influenza Split 06/25/2011, 04/15/2012   Influenza Whole 05/04/2009, 06/06/2010   Influenza, High Dose Seasonal PF 05/03/2013   Influenza,inj,Quad PF,6+ Mos 04/07/2014, 04/03/2015, 04/19/2016, 05/02/2017   Influenza-Unspecified 05/13/2012, 04/07/2014, 04/21/2018, 04/11/2021   PFIZER(Purple Top)SARS-COV-2 Vaccination 07/30/2019, 08/20/2019   Pneumococcal Conjugate-13 04/07/2014   Pneumococcal Polysaccharide-23 05/04/2009, 04/07/2014, 05/02/2017   Td 08/08/2010   Zoster, Live 08/10/2012    Assessment:  COPD, worsening control. Chronic Rhinitis - worsening control History of 100 pack year smoking, quit 1999  Plan/Recommendations: Continue albuterol Stop trelegy, switch to breztri - has trouble generating negative inspiratory force with DPI.  Will start trelegy. Samples given today. Continue singulair, flonase,  zyrtect for rhinitis, with nasal saline. Add astelin nasal spray for ongoing rhinitis Continue daliresp for now.   Dyspnea and symptoms are out of proportion to his last pfts. May repeat after next visit.    Return to Care: Return in about 3 months (around 09/17/2021).   Lenice Llamas, MD Pulmonary and [place]

## 2021-06-21 ENCOUNTER — Other Ambulatory Visit: Payer: Medicare Other

## 2021-06-21 DIAGNOSIS — J449 Chronic obstructive pulmonary disease, unspecified: Secondary | ICD-10-CM | POA: Diagnosis not present

## 2021-06-24 LAB — RESPIRATORY CULTURE OR RESPIRATORY AND SPUTUM CULTURE
MICRO NUMBER:: 12762015
RESULT:: NORMAL
SPECIMEN QUALITY:: ADEQUATE

## 2021-07-03 ENCOUNTER — Telehealth: Payer: Self-pay

## 2021-07-03 DIAGNOSIS — R5383 Other fatigue: Secondary | ICD-10-CM | POA: Diagnosis not present

## 2021-07-03 DIAGNOSIS — I10 Essential (primary) hypertension: Secondary | ICD-10-CM | POA: Diagnosis not present

## 2021-07-03 DIAGNOSIS — M069 Rheumatoid arthritis, unspecified: Secondary | ICD-10-CM | POA: Diagnosis not present

## 2021-07-03 DIAGNOSIS — Z79899 Other long term (current) drug therapy: Secondary | ICD-10-CM | POA: Diagnosis not present

## 2021-07-03 NOTE — Telephone Encounter (Signed)
I spoke with pt; pt having lower lt abd pain and lower lt back pain. Pt does not feel like he empties bowels or bladder when goes to bathroom. Pt does not feel constipated.  No fever.  Pt not having pain when urinates or has bowel movement.  Pt said several yrs since colonoscopy. Pt states not going frequently of urine. No hx of kidney stone but over the years has had a lot of polyps. Pt has not seen GI in years.  Pt said he does not want to go to ED and will go to UC on [address]. Pt said he will cb on 07/04/21 with update after going to UC. No available appts on 07/04/21  at [facility].Sending note to Dr Silvio Pate and Larene Beach CMA.

## 2021-07-03 NOTE — Telephone Encounter (Signed)
[facility] - Client TELEPHONE ADVICE RECORD [facility] Patient Name: Thomas Hudson [place] Gender: Male DOB: [birth year] Age: 80 Y 82 M 24 D Return Phone Number: [phone] (Primary), [phone] (Secondary), [phone] (Alternate) Address: [place]  [address] Client [facility] - Client Client Site [place] - Day Provider Viviana Simpler- MD Contact Type Call Who Is Calling Patient / Member / Family / Caregiver Call Type Triage / Clinical Caller Name Blythe Stanford Relationship To Patient Daughter Return Phone Number [phone] (Primary) Chief Complaint Abdominal Pain Reason for Call Symptomatic / Request for [place] states her father has left abd pain radiating to his hip and back. Has not been feeling well since last Thursday. Translation No Nurse Assessment Nurse: Adrian Blackwater, RN, Claiborne Billings Date/Time Eilene Ghazi Time): 07/03/2021 3:53:04 PM Confirm and document reason for call. If symptomatic, describe symptoms. ---Caller states her father has been having pain in his Left hip and back area since last week. When pt is on phone, he states the pain started at least a week. Pt states the pain is in his lower left abdomen, just above his hip, which radiates through to his back. Does the patient have any new or worsening symptoms? ---Yes Will a triage be completed? ---Yes Related visit to physician within the last 2 weeks? ---No Does the PT have any chronic conditions? (i.e. diabetes, asthma, this includes High risk factors for pregnancy, etc.) ---Yes List chronic conditions. ---HTN, COPD Is this a behavioral health or substance abuse call? ---No Guidelines Guideline Title Affirmed Question Affirmed Notes Nurse Date/Time Eilene Ghazi Time) Abdominal Pain - Male [1] MILDMODERATE pain AND [2] constant AND [3] present > 2 hours Gigi Gin 07/03/2021 3:56:18 PM PLEASE NOTE: All timestamps contained within this report are represented as [place] Standard Time. CONFIDENTIALTY NOTICE: This fax transmission is intended only for the addressee. It contains information that is legally privileged, confidential or otherwise protected from use or disclosure. If you are not the intended recipient, you are strictly prohibited from reviewing, disclosing, copying using or disseminating any of this information or taking any action in reliance on or regarding this information. If you have received this fax in error, please notify us immediately by telephone so that we can arrange for its return to [place]. Phone: [phone], Toll-Free: [phone], Fax: [phone] Page: 2 of 2 Call Id: [id number] [place]. Time Eilene Ghazi Time) Disposition Final User 07/03/2021 4:00:52 PM See HCP within 4 Hours (or PCP triage) Yes Adrian Blackwater, RN, Maxie Barb Disagree/Comply Comply Caller Understands Yes PreDisposition Call Doctor Care Advice Given Per Guideline SEE HCP (OR PCP TRIAGE) WITHIN 4 HOURS: * IF OFFICE WILL BE OPEN: You need to be seen within the next 3 or 4 hours. Call your doctor (or NP/PA) now or as soon as the office opens. * IF OFFICE WILL BE CLOSED AND NO PCP (PRIMARY CARE PROVIDER) SECOND-LEVEL TRIAGE: You need to be seen within the next 3 or 4 hours. A nearby Urgent Care Center [facility]) is often a good source of care. Another choice is to go to the ED. Go sooner if you become worse. NOTHING BY MOUTH: * Do not eat or drink anything for now. CALL BACK IF: * You become worse Comments User: Delana Meyer, RN Date/Time Eilene Ghazi Time): 07/03/2021 3:56:34 PM Current pain level 3-4/10 User: Delana Meyer, RN Date/Time Eilene Ghazi Time): 07/03/2021 4:02:22 PM Per appt line, no openings today. Pt should go to ER or  UC. Advised pt and his daughter of this, and pt refused but will talk it over with his daughter. Referrals GO TO FACILITY REFUSE

## 2021-07-04 ENCOUNTER — Emergency Department (HOSPITAL_BASED_OUTPATIENT_CLINIC_OR_DEPARTMENT_OTHER): Payer: Medicare Other

## 2021-07-04 ENCOUNTER — Other Ambulatory Visit: Payer: Self-pay

## 2021-07-04 ENCOUNTER — Encounter (HOSPITAL_BASED_OUTPATIENT_CLINIC_OR_DEPARTMENT_OTHER): Payer: Self-pay | Admitting: Emergency Medicine

## 2021-07-04 ENCOUNTER — Emergency Department (HOSPITAL_BASED_OUTPATIENT_CLINIC_OR_DEPARTMENT_OTHER)
Admission: EM | Admit: 2021-07-04 | Discharge: 2021-07-04 | Disposition: A | Discharge status: Home / Self Care | Payer: Medicare Other | Attending: Emergency Medicine | Admitting: Emergency Medicine

## 2021-07-04 ENCOUNTER — Telehealth: Payer: Self-pay | Admitting: Internal Medicine

## 2021-07-04 DIAGNOSIS — N401 Enlarged prostate with lower urinary tract symptoms: Secondary | ICD-10-CM | POA: Diagnosis not present

## 2021-07-04 DIAGNOSIS — Z87891 Personal history of nicotine dependence: Secondary | ICD-10-CM | POA: Diagnosis not present

## 2021-07-04 DIAGNOSIS — R1032 Left lower quadrant pain: Secondary | ICD-10-CM | POA: Diagnosis not present

## 2021-07-04 DIAGNOSIS — I10 Essential (primary) hypertension: Secondary | ICD-10-CM | POA: Insufficient documentation

## 2021-07-04 DIAGNOSIS — R3914 Feeling of incomplete bladder emptying: Secondary | ICD-10-CM | POA: Diagnosis not present

## 2021-07-04 DIAGNOSIS — J449 Chronic obstructive pulmonary disease, unspecified: Secondary | ICD-10-CM | POA: Diagnosis not present

## 2021-07-04 DIAGNOSIS — R634 Abnormal weight loss: Secondary | ICD-10-CM | POA: Diagnosis not present

## 2021-07-04 DIAGNOSIS — K469 Unspecified abdominal hernia without obstruction or gangrene: Secondary | ICD-10-CM | POA: Diagnosis not present

## 2021-07-04 DIAGNOSIS — Z7951 Long term (current) use of inhaled steroids: Secondary | ICD-10-CM | POA: Diagnosis not present

## 2021-07-04 DIAGNOSIS — R109 Unspecified abdominal pain: Secondary | ICD-10-CM | POA: Diagnosis not present

## 2021-07-04 DIAGNOSIS — Z79899 Other long term (current) drug therapy: Secondary | ICD-10-CM | POA: Insufficient documentation

## 2021-07-04 DIAGNOSIS — R1902 Left upper quadrant abdominal swelling, mass and lump: Secondary | ICD-10-CM | POA: Diagnosis not present

## 2021-07-04 DIAGNOSIS — R1030 Lower abdominal pain, unspecified: Secondary | ICD-10-CM

## 2021-07-04 DIAGNOSIS — R1012 Left upper quadrant pain: Secondary | ICD-10-CM | POA: Diagnosis not present

## 2021-07-04 HISTORY — DX: Chronic obstructive pulmonary disease, unspecified: J44.9

## 2021-07-04 LAB — CBC
HCT: 47.4 % (ref 39.0–52.0)
Hemoglobin: 15.6 g/dL (ref 13.0–17.0)
MCH: 29.6 pg (ref 26.0–34.0)
MCHC: 32.9 g/dL (ref 30.0–36.0)
MCV: 89.9 fL (ref 80.0–100.0)
Platelets: 253 10*3/uL (ref 150–400)
RBC: 5.27 MIL/uL (ref 4.22–5.81)
RDW: 12.8 % (ref 11.5–15.5)
WBC: 6.8 10*3/uL (ref 4.0–10.5)
nRBC: 0 % (ref 0.0–0.2)

## 2021-07-04 LAB — COMPREHENSIVE METABOLIC PANEL
ALT: 12 U/L (ref 0–44)
AST: 12 U/L — ABNORMAL LOW (ref 15–41)
Albumin: 4.1 g/dL (ref 3.5–5.0)
Alkaline Phosphatase: 58 U/L (ref 38–126)
Anion gap: 10 (ref 5–15)
BUN: 13 mg/dL (ref 8–23)
CO2: 25 mmol/L (ref 22–32)
Calcium: 9.6 mg/dL (ref 8.9–10.3)
Chloride: 105 mmol/L (ref 98–111)
Creatinine, Ser: 1.25 mg/dL — ABNORMAL HIGH (ref 0.61–1.24)
GFR, Estimated: 59 mL/min — ABNORMAL LOW (ref >60)
Glucose, Bld: 113 mg/dL — ABNORMAL HIGH (ref 70–99)
Potassium: 3.5 mmol/L (ref 3.5–5.1)
Sodium: 140 mmol/L (ref 135–145)
Total Bilirubin: 1 mg/dL (ref 0.3–1.2)
Total Protein: 6.8 g/dL (ref 6.5–8.1)

## 2021-07-04 LAB — URINALYSIS, ROUTINE W REFLEX MICROSCOPIC
Bilirubin Urine: NEGATIVE
Glucose, UA: NEGATIVE mg/dL
Hgb urine dipstick: NEGATIVE
Ketones, ur: NEGATIVE mg/dL
Leukocytes,Ua: NEGATIVE
Nitrite: NEGATIVE
Specific Gravity, Urine: >1.046 — ABNORMAL HIGH (ref 1.005–1.030)
pH: 6 (ref 5.0–8.0)

## 2021-07-04 LAB — LIPASE, BLOOD: Lipase: 22 U/L (ref 11–51)

## 2021-07-04 MED ORDER — IOHEXOL 300 MG/ML  SOLN
80.0000 mL | Freq: Once | INTRAMUSCULAR | Status: AC | PRN
  Administered 2021-07-04: 13:00:00 80 mL via INTRAVENOUS

## 2021-07-04 NOTE — ED Provider Notes (Signed)
[facility] EMERGENCY DEPT Provider Note   CSN: [id number] Arrival date & time: 07/04/21  1054    History Chief Complaint  Patient presents with   Abdominal Pain    Thomas Hudson is a 80 y.o. male with past medical history significant for RA, COPD, BPH, hypertension, hyperlipidemia who presents for evaluation abdominal pain.  Began over Thanksgiving weekend.  Has progressively worsened.  Located to lower abdomen, left lower quadrant.  Occasionally radiates into back.  No history of AAA or dissection.  Pain does not extend into the legs.  No fever, chills, nausea, vomiting, paresthesias, weakness, lower extremity swelling, numbness.  Having "normal" bowel movements, last bowel movement just PTA.  No melena or blood per rectum.  No dysuria, hematuria, flank pain.  Unsure last colonoscopy however states Thomas Hudson "aged out."  Has had polyps removed previously.  Not currently followed by GI.  No recent falls or injuries.  No pain to pelvis, hips, femur.  Denies additional aggravating or alleviating factors.  Thomas Hudson has not taken anything for his symptoms.  Describes his symptoms as achy. Initially had some nausea.  No pain, swelling to scrotum.  Seen in urgent care earlier today which showed possible mass versus collapsed proximal stomach.  History obtained from patient and past medical records.  No interpreter is used.   HPI     Past Medical History:  Diagnosis Date   BPH (benign prostatic hyperplasia)    Carpal tunnel syndrome on both sides    COPD (chronic obstructive pulmonary disease) (HCC)    GERD (gastroesophageal reflux disease)    Hyperlipidemia    Hypertension    RAD (reactive airway disease)    no PMH of asthma; RAD post infection   Rheumatoid arthritis ([place])         Patient Active Problem List   Diagnosis Date Noted   Venous insufficiency 11/24/2020   Lower extremity edema 11/21/2020   DOE (dyspnea on exertion) 05/12/2019   Ampullary adenoma    Duodenal  adenoma    Upper airway cough syndrome 05/14/2016   COPD (chronic obstructive pulmonary disease) ([place]) 04/19/2016   Preventative health care 04/18/2015   Advanced directives, counseling/discussion 04/07/2014   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/02/2012   REACTIVE AIRWAY DISEASE 06/23/2009   ANEMIA-IRON DEFICIENCY 06/19/2009   B12 deficiency anemia 05/16/2009   Allergic rhinitis due to pollen 03/17/2008   BPH (benign prostatic hyperplasia) 03/17/2008   Essential hypertension, benign 07/07/2007   Esophageal reflux 07/07/2007   ESOPHAGEAL STRICTURE 03/25/2007    Past Surgical History:  Procedure Laterality Date   BIOPSY  01/06/2018   Procedure: BIOPSY;  Surgeon: Irene Shipper, MD;  Location: [facility]COPY;  Service: Endoscopy;;   BIOPSY  01/03/2020   Procedure: BIOPSY;  Surgeon: Irene Shipper, MD;  Location: [facility]OS[facility];  Service: Endoscopy;;   COLONOSCOPY W/ POLYPECTOMY  2009   X 2; Dr.Perry    ERCP N/A 11/05/2016   Procedure: ENDOSCOPIC RETROGRADE CHOLANGIOPANCREATOGRAPHY (ERCP);  Surgeon: Irene Shipper, MD;  Location: [facility] ENDOSCOPY;  Service: Endoscopy;  Laterality: N/A;   ESOPHAGOGASTRODUODENOSCOPY (EGD) WITH PROPOFOL N/A 07/29/2016   Procedure: ESOPHAGOGASTRODUODENOSCOPY (EGD) WITH PROPOFOL;  Surgeon: Irene Shipper, MD;  Location: [facility] ENDOSCOPY;  Service: Endoscopy;  Laterality: N/A;  will need ERCP scope   ESOPHAGOGASTRODUODENOSCOPY (EGD) WITH PROPOFOL N/A 01/06/2018   Procedure: ESOPHAGOGASTRODUODENOSCOPY (EGD) WITH PROPOFOL;  Surgeon: Irene Shipper, MD;  Location: [facility]OS[facility];  Service: Endoscopy;  Laterality: N/A;  NEED ERCP SCOPE   ESOPHAGOGASTRODUODENOSCOPY (  EGD) WITH PROPOFOL N/A 01/03/2020   Procedure: ESOPHAGOGASTRODUODENOSCOPY (EGD) WITH PROPOFOL;  Surgeon: Irene Shipper, MD;  Location: [facility] ENDOSCOPY;  Service: Endoscopy;  Laterality: N/A;  Needs ERCP scope   HERNIA REPAIR     LUMBAR LAMINECTOMY  07/2008   UPPER GASTROINTESTINAL ENDOSCOPY      gastric polyp;Dr.Perry     VASECTOMY     WHIPPLE PROCEDURE  2012   partial ; [facility]       Family History  Problem Relation Age of Onset   Throat cancer Father        smoker   Esophageal cancer Father    Prostate cancer Paternal Uncle    Heart attack Paternal Uncle        3 uncles, >55   Coronary artery disease Brother        in 40s   Esophageal cancer Brother    Skin cancer Mother    Throat cancer Brother    Heart disease Brother    Uterine cancer Sister    COPD Sister        X2   Ovarian cancer Sister    Stroke Paternal Grandmother 65   Colon cancer Paternal Grandfather    Heart disease Paternal Grandfather    Heart disease Maternal Grandfather    Throat cancer Sister        smoker   Breast cancer Sister    Lung cancer Sister        smoker   Leukemia Sister    Heart disease Brother    Asthma Neg Hx     Social History   Tobacco Use   Smoking status: Former    Packs/day: 2.00    Years: 50.00    Pack years: 100.00    Types: Cigarettes    Quit date: 07/08/1997    Years since quitting: 24.0   Smokeless tobacco: Never  Substance Use Topics   Alcohol use: No    Alcohol/week: 0.0 standard drinks   Drug use: No    Home Medications Prior to Admission medications   Medication Sig Start Date End Date Taking? Authorizing Provider  albuterol (PROVENTIL) (2.5 MG/3ML) 0.083% nebulizer solution Take 3 mLs (2.5 mg total) by nebulization every 6 (six) hours as needed for wheezing or shortness of breath. 06/19/21 10/17/21 Yes Spero Geralds, MD  albuterol (VENTOLIN HFA) 108 (90 Base) MCG/ACT inhaler Inhale 2 puffs into the lungs every 6 (six) hours as needed for wheezing or shortness of breath. 07/31/20  Yes Spero Geralds, MD  alfuzosin (UROXATRAL) 10 MG 24 hr tablet TAKE 1 TABLET(10 MG) BY MOUTH DAILY 04/02/21  Yes Viviana Simpler I, MD  amLODipine (NORVASC) 5 MG tablet Take 1 tablet (5 mg total) by mouth daily. 06/22/20  Yes [name], MD  aspirin-acetaminophen-caffeine (EXCEDRIN MIGRAINE)  [id number] MG tablet Take 2 tablets by mouth 3 (three) times daily as needed for headache.   Yes [provider]  azelastine (ASTELIN) 0.1 % nasal spray Place 1 spray into both nostrils 2 (two) times daily. Use in each nostril as directed 06/19/21  Yes Spero Geralds, MD  Budeson-Glycopyrrol-Formoterol (BREZTRI AEROSPHERE) 160-9-4.8 MCG/ACT AERO Inhale 2 puffs into the lungs in the morning and at bedtime. 06/19/21 09/17/21 Yes Spero Geralds, MD  Certolizumab Pegol (CIMZIA) 2 X 200 MG KIT Inject 2 each into the skin every 30 (thirty) days.   Yes [provider]  cetirizine (ZYRTEC) 10 MG tablet Take 1 tablet (10 mg total) by mouth at bedtime.  11/21/20  Yes Martyn Ehrich, NP  doxycycline (MONODOX) 50 MG capsule  08/14/20  Yes [provider]  erythromycin ophthalmic ointment Place 1 application into both eyes daily as needed (dry eyes).   Yes [provider]  ferrous sulfate 325 (65 FE) MG tablet Take 1 tablet (325 mg total) by mouth 3 (three) times daily with meals. 06/22/20  Yes [name], MD  finasteride (PROSCAR) 5 MG tablet Take 1 tablet (5 mg total) by mouth daily. 06/22/20  Yes [name], MD  fluticasone (FLONASE) 50 MCG/ACT nasal spray Place 2 sprays into both nostrils daily as needed for allergies. 06/22/20  Yes [name], MD  folic acid (FOLVITE) 1 MG tablet Take 1 mg by mouth daily.  04/10/18  Yes [provider]  furosemide (LASIX) 40 MG tablet Take 1 tablet (40 mg total) by mouth daily as needed. For increased leg swelling 05/15/21  Yes Viviana Simpler I, MD  gabapentin (NEURONTIN) 300 MG capsule Take 300-600 mg by mouth See admin instructions. Take 1 capsule (300 mg) by mouth in the morning & take 2 capsules (600 mg) by mouth at night. 10/14/19  Yes [provider]  losartan (COZAAR) 100 MG tablet Take 1 tablet (100 mg total) by mouth daily. 06/22/20  Yes [name], MD  montelukast (SINGULAIR) 10 MG tablet  Take 1 tablet (10 mg total) by mouth at bedtime. 06/22/20  Yes [name], MD  Multiple Vitamin (MULTIVITAMIN WITH MINERALS) TABS tablet Take 1 tablet by mouth daily.   Yes [provider]  omeprazole (PRILOSEC) 20 MG capsule Take 1 capsule (20 mg total) by mouth daily. 06/22/20  Yes [name], MD  potassium chloride SA (KLOR-CON) 20 MEQ tablet Take 1 tablet (20 mEq total) by mouth daily. 12/27/20  Yes [name], MD  predniSONE (DELTASONE) 5 MG tablet Take 5 mg by mouth See admin instructions. Take 1 tablet daily 09/02/19  Yes [provider]  roflumilast (DALIRESP) 500 MCG TABS tablet Take 1 tablet (500 mcg total) by mouth daily. 04/11/21  Yes Martyn Ehrich, NP    Allergies    Morphine and related  Review of Systems   Review of Systems  Constitutional: Negative.   HENT: Negative.    Respiratory: Negative.    Cardiovascular: Negative.   Gastrointestinal:  Positive for abdominal pain. Negative for abdominal distention, anal bleeding, blood in stool, constipation, diarrhea, nausea, rectal pain and vomiting.  Genitourinary: Negative.   Musculoskeletal:  Negative for arthralgias, back pain, gait problem, joint swelling, myalgias, neck pain and neck stiffness.  Skin: Negative.   Neurological: Negative.   All other systems reviewed and are negative.  Physical Exam Updated Vital Signs BP (!) 155/66    Pulse 66    Temp 97.9 F (36.6 C) (Oral)    Resp 19    Ht $R'5\' 7"'LX$  (1.702 m)    Wt 88 kg    SpO2 97%    BMI 30.39 kg/m   Physical Exam Vitals and nursing note reviewed.  Constitutional:      General: Thomas Hudson is not in acute distress.    Appearance: Thomas Hudson is well-developed. Thomas Hudson is not ill-appearing, toxic-appearing or diaphoretic.  HENT:     Head: Normocephalic and atraumatic.     Mouth/Throat:     Mouth: Mucous membranes are moist.  Eyes:     Pupils: Pupils are equal, round, and reactive to light.  Cardiovascular:     Rate and Rhythm:  Normal rate and  regular rhythm.     Pulses: Normal pulses.          Radial pulses are 2+ on the right side and 2+ on the left side.       Dorsalis pedis pulses are 2+ on the right side and 2+ on the left side.       Posterior tibial pulses are 2+ on the right side and 2+ on the left side.     Heart sounds: Normal heart sounds.  Pulmonary:     Effort: Pulmonary effort is normal. No respiratory distress.     Breath sounds: Normal breath sounds.     Comments: Clear bilaterally, speaks in full sentences without difficulty. Abdominal:     General: Bowel sounds are normal. There is no distension.     Palpations: Abdomen is soft.     Tenderness: There is abdominal tenderness in the periumbilical area, suprapubic area and left lower quadrant. There is no right CVA tenderness, left CVA tenderness, guarding or rebound. Negative signs include Murphy's sign and McBurney's sign.     Hernia: A hernia is present. Hernia is present in the umbilical area and ventral area.     Comments: Reducible periumbilical, ventral hernia.  Diffuse tenderness to lower abdomen, left lower quadrant.  No overlying skin changes to abdominal wall.  Thomas Hudson has old well-healed surgical sites.  Musculoskeletal:        General: Normal range of motion.     Cervical back: Normal range of motion and neck supple.     Comments: Pelvis stable, nontender palpation.  Full range of motion lower extremities without difficulty.  No midline C/T/L tenderness.  No shortening or rotation of legs. No LE edema  Feet:     Comments: No lower extremity edema Skin:    General: Skin is warm and dry.     Capillary Refill: Capillary refill takes less than 2 seconds.  Neurological:     General: No focal deficit present.     Mental Status: Thomas Hudson is alert and oriented to person, place, and time.     Cranial Nerves: Cranial nerves 2-12 are intact.     Sensory: Sensation is intact.     Motor: Motor function is intact.     Gait: Gait is intact.     Comments: Intact  sensation Ambulatory without difficulty Equal strength bilaterally   ED Results / Procedures / Treatments   Labs (all labs ordered are listed, but only abnormal results are displayed) Labs Reviewed  COMPREHENSIVE METABOLIC PANEL - Abnormal; Notable for the following components:      Result Value   Glucose, Bld 113 (*)    Creatinine, Ser 1.25 (*)    AST 12 (*)    GFR, Estimated 59 (*)    All other components within normal limits  URINALYSIS, ROUTINE W REFLEX MICROSCOPIC - Abnormal; Notable for the following components:   Specific Gravity, Urine >1.046 (*)    Protein, ur TRACE (*)    All other components within normal limits  LIPASE, BLOOD  CBC    EKG None  Radiology CT ABDOMEN PELVIS W CONTRAST  Result Date: 07/04/2021 CLINICAL DATA:  Intermittent left lower quadrant abdominal pain. EXAM: CT ABDOMEN AND PELVIS WITH CONTRAST TECHNIQUE: Multidetector CT imaging of the abdomen and pelvis was performed using the standard protocol following bolus administration of intravenous contrast. CONTRAST:  41m OMNIPAQUE IOHEXOL 300 MG/ML  SOLN COMPARISON:  None. FINDINGS: Lower chest: Minimal dependent atelectasis is present. Heart size  is normal. No significant pleural or pericardial effusion is present. Hepatobiliary: No focal liver abnormality is seen. Status post cholecystectomy. No biliary dilatation. Pancreas: Unremarkable. No pancreatic ductal dilatation or surrounding inflammatory changes. Spleen: Normal in size without focal abnormality. Adrenals/Urinary Tract: Adrenal glands are normal bilaterally. Asymmetric cortical atrophy noted in the left kidney. No discrete lesion is present. No stone or obstruction is present. The ureters are within normal limits bilaterally. The urinary bladder is within normal limits. Stomach/Bowel: The stomach and duodenum are within normal limits. Small bowel is unremarkable. Terminal ileum is within normal limits. The appendix is not discretely visualized and may  be surgically absent. The ascending and transverse colon are within normal limits. Diverticular changes are present the distal descending and proximal sigmoid colon. Fluid is present in the rectum. Vascular/Lymphatic: Atherosclerotic changes are present in the aorta and branch vessels. No significant adenopathy is present. Reproductive: The prostate gland measures 5.4 cm in transverse diameter. Other: 2 ventral hernias are present about the umbilicus. There is some stranding of fat within both hernias. No associated bowel is present. The hernias both measure less than 10 mm at their opening. Musculoskeletal: Degenerative facet changes are present in the lumbar spine right greater than left. No focal osseous lesions are present. Bony pelvis is within normal limits. The hips are located and within normal limits. IMPRESSION: 1. 2 ventral hernias about the umbilicus with some stranding of fat within both hernias. No associated bowel is present. 2. Sigmoid diverticulosis without diverticulitis. 3. Fluid in the rectum may represent diarrhea. 4. Cholecystectomy. 5. Aortic Atherosclerosis (ICD10-I70.0). Electronically Signed   By: San Morelle M.D.   On: 07/04/2021 13:18    Procedures Procedures   Medications Ordered in ED Medications  iohexol (OMNIPAQUE) 300 MG/ML solution 80 mL (80 mLs Intravenous Contrast Given 07/04/21 1249)    ED Course  I have reviewed the triage vital signs and the nursing notes.  Pertinent labs & imaging results that were available during my care of the patient were reviewed by me and considered in my medical decision making (see chart for details).  Pleasant 80 year old here for evaluation of lower abdominal pain which sounds like is been going on for more than a few weeks.  No changes in bowels.  No urinary complaints.  No scrotal swelling.  No melena, blood per rectum.  Does have diffuse tenderness to lower abdomen, left lower quadrant.  No midline C/T/L tenderness.  No  bony tenderness to extremities.  Is not appear clinically fluid overloaded.  Does have small, reducible, nontender peer umbilical and ventral wall hernia.   Labs and imaging personally reviewed and interpreted: CBC without leukocytosis CMP Glucose 113, creatinine 1.25 similar to prior labs Lipase 22 UA negative for infection CT AP ventral hernias without incarceration and strangulation  Patient reassessed.  Currently denies any pain.  Thomas Hudson is tolerating p.o. intake.  Unclear etiology of his symptoms.  We will have him follow-up with GI/PCP given duration of pain.  Thomas Hudson will return for new or worsening symptoms.  Patient does not meet the SIRS or Sepsis criteria.  On repeat exam patient does not have a surgical abdomin and there are no peritoneal signs.  No indication of appendicitis, bowel obstruction, bowel perforation, cholecystitis, diverticulitis, AAA, dissection.    The patient has been appropriately medically screened and/or stabilized in the ED. I have low suspicion for any other emergent medical condition which would require further screening, evaluation or treatment in the ED or require inpatient management.  Patient is hemodynamically stable and in no acute distress.  Patient able to ambulate in department prior to ED.  Evaluation does not show acute pathology that would require ongoing or additional emergent interventions while in the emergency department or further inpatient treatment.  I have discussed the diagnosis with the patient and answered all questions.  Pain is been managed while in the emergency department and patient has no further complaints prior to discharge.  Patient is comfortable with plan discussed in room and is stable for discharge at this time.  I have discussed strict return precautions for returning to the emergency department.  Patient was encouraged to follow-up with PCP/specialist refer to at discharge.      MDM Rules/Calculators/A&P                              Final Clinical Impression(s) / ED Diagnoses Final diagnoses:  Lower abdominal pain    Rx / DC Orders ED Discharge Orders     None        Rosie Golson A, PA-C 07/04/21 1450    Pattricia Boss, MD 07/04/21 1536

## 2021-07-04 NOTE — ED Triage Notes (Signed)
Pt arrives to ED with c/o abdominal pain. This started x1 month ago. The pain is intermittent and located in his lower abdomen. Pt reports he developed lower back pain that started x2 weeks ago. Pt had Abd Xray at [facility] today which showed possible mass or collapsed proximal stomach. Associated symptoms include nausea, emesis. Pain is described as achy.

## 2021-07-04 NOTE — Telephone Encounter (Signed)
Patients daughter called stating that patient went to Urgent Care this morning and they said  that it looked like he has a mass in stomach or that the stomach had clasped. Urgent Care advised patient top go to the ER. ER did a Ct and stated that they did not see anything but recommend to follow up with GI ASAP. Advised daughter that Thomas Hudson's next appointment was in February and also told her that 1/16 would be the soonest date for a PA. Daughter is worried that if he waits that long that something might happen to him in the mean time. Seeking advice, please advise.

## 2021-07-04 NOTE — ED Notes (Signed)
Patient verbalizes understanding of discharge instructions. Opportunity for questioning and answers were provided. Patient discharged from ED.  °

## 2021-07-04 NOTE — Discharge Instructions (Signed)
It was a pleasure taking care of you here in the emergency department today.  As discussed in the room your lab work and urine did not show significant normality  Your CT scan did show some abdominal wall hernias  I would recommend following up with GI given duration of your  pain.  Return for new or worsening symptoms

## 2021-07-05 ENCOUNTER — Other Ambulatory Visit: Payer: Self-pay

## 2021-07-05 ENCOUNTER — Encounter: Payer: Self-pay | Admitting: Nurse Practitioner

## 2021-07-05 ENCOUNTER — Ambulatory Visit (INDEPENDENT_AMBULATORY_CARE_PROVIDER_SITE_OTHER): Payer: Medicare Other | Admitting: Nurse Practitioner

## 2021-07-05 VITALS — BP 118/66 | HR 100 | Temp 98.1°F | Resp 18 | Ht 67.0 in | Wt 197.1 lb

## 2021-07-05 DIAGNOSIS — K219 Gastro-esophageal reflux disease without esophagitis: Secondary | ICD-10-CM

## 2021-07-05 DIAGNOSIS — R7989 Other specified abnormal findings of blood chemistry: Secondary | ICD-10-CM | POA: Insufficient documentation

## 2021-07-05 DIAGNOSIS — K429 Umbilical hernia without obstruction or gangrene: Secondary | ICD-10-CM | POA: Diagnosis not present

## 2021-07-05 DIAGNOSIS — R1030 Lower abdominal pain, unspecified: Secondary | ICD-10-CM | POA: Insufficient documentation

## 2021-07-05 DIAGNOSIS — I10 Essential (primary) hypertension: Secondary | ICD-10-CM

## 2021-07-05 DIAGNOSIS — J302 Other seasonal allergic rhinitis: Secondary | ICD-10-CM

## 2021-07-05 LAB — BASIC METABOLIC PANEL
BUN: 11 mg/dL (ref 6–23)
CO2: 28 mEq/L (ref 19–32)
Calcium: 9.3 mg/dL (ref 8.4–10.5)
Chloride: 102 mEq/L (ref 96–112)
Creatinine, Ser: 1.3 mg/dL (ref 0.40–1.50)
GFR: 52.63 mL/min — ABNORMAL LOW (ref >60.00)
Glucose, Bld: 104 mg/dL — ABNORMAL HIGH (ref 70–99)
Potassium: 3.8 mEq/L (ref 3.5–5.1)
Sodium: 138 mEq/L (ref 135–145)

## 2021-07-05 MED ORDER — FLUTICASONE PROPIONATE 50 MCG/ACT NA SUSP: 2.0000 | Freq: Every day | NASAL | 3 refills | Status: DC | PRN

## 2021-07-05 MED ORDER — POTASSIUM CHLORIDE CRYS ER 20 MEQ PO TBCR
20.0000 meq | EXTENDED_RELEASE_TABLET | Freq: Every day | ORAL | 3 refills | Status: DC
Start: 2021-07-05 — End: 2022-06-13

## 2021-07-05 MED ORDER — OMEPRAZOLE 20 MG PO CPDR: 20.0000 mg | DELAYED_RELEASE_CAPSULE | Freq: Every day | ORAL | 3 refills | Status: DC

## 2021-07-05 MED ORDER — MONTELUKAST SODIUM 10 MG PO TABS: 10.0000 mg | ORAL_TABLET | Freq: Every day | ORAL | 3 refills | Status: DC

## 2021-07-05 MED ORDER — FUROSEMIDE 40 MG PO TABS: 40.0000 mg | ORAL_TABLET | Freq: Every day | ORAL | 3 refills | Status: DC | PRN

## 2021-07-05 MED ORDER — CETIRIZINE HCL 10 MG PO TABS: 10.0000 mg | ORAL_TABLET | Freq: Every day | ORAL | 3 refills | Status: DC

## 2021-07-05 MED ORDER — AMLODIPINE BESYLATE 5 MG PO TABS: 5.0000 mg | ORAL_TABLET | Freq: Every day | ORAL | 3 refills | Status: DC

## 2021-07-05 MED ORDER — ALFUZOSIN HCL ER 10 MG PO TB24: ORAL_TABLET | ORAL | 3 refills | Status: DC

## 2021-07-05 MED ORDER — LOSARTAN POTASSIUM 100 MG PO TABS: 100.0000 mg | ORAL_TABLET | Freq: Every day | ORAL | 3 refills | Status: DC

## 2021-07-05 MED ORDER — FINASTERIDE 5 MG PO TABS: 5.0000 mg | ORAL_TABLET | Freq: Every day | ORAL | 3 refills | Status: DC

## 2021-07-05 MED ORDER — FERROUS SULFATE 325 (65 FE) MG PO TABS: 325.0000 mg | ORAL_TABLET | Freq: Three times a day (TID) | ORAL | 3 refills | Status: DC

## 2021-07-05 NOTE — Telephone Encounter (Signed)
Called and spoke with daughter. Scheduled pt with Dr. Henrene Pastor on 07/16/21 at 3:40 pm. Daughter verbalized understanding and had no concerns at end of call.

## 2021-07-05 NOTE — Telephone Encounter (Signed)
Patient asked to have his medications followed by Dr Silvio Pate re sent to [facility]. Re sent since this was already approved by Dr Silvio Pate recently just went to a different pharmacy

## 2021-07-05 NOTE — Progress Notes (Signed)
Acute Office Visit  Subjective:    Patient ID: Thomas Hudson, male    DOB: 26-Jun-1941, 80 y.o.   MRN: [id number]  Chief Complaint  Patient presents with   Abdominal Pain    Started over the thanksgiving weekend. Was getting worse. Left lower area. Patient went to Urgent care and ER yesterday-07/04/21 and had work up done. Has GI appointment on 07/16/21. Worse with movement vs rest. Some nausea. Has not taking anything for the pain.      Patient is in today for Abdominla pain  ER follow up  Aching pain that starts at the lower abdomen  around and below his umbilical area and goes to his back Has not tried anything over the counter for the pain. Stomach has kept him awake at night.  Ct abdomen and pelvis was performed in ED setting I did review the official read.  States that it gets worse with movement but does settle down with rest. He also stated that he cannot sleep completely flat on his back because of the pain also.   Hx of back issues and followed by Dr. Santiago Glad   2-3/10 currently goes up to 8-9/10 Past Medical History:  Diagnosis Date   BPH (benign prostatic hyperplasia)    Carpal tunnel syndrome on both sides    COPD (chronic obstructive pulmonary disease) (HCC)    GERD (gastroesophageal reflux disease)    Hyperlipidemia    Hypertension    RAD (reactive airway disease)    no PMH of asthma; RAD post infection   Rheumatoid arthritis [facility])         Past Surgical History:  Procedure Laterality Date   BIOPSY  01/06/2018   Procedure: BIOPSY;  Surgeon: Irene Shipper, MD;  Location: [facility] ENDOSCOPY;  Service: Endoscopy;;   BIOPSY  01/03/2020   Procedure: BIOPSY;  Surgeon: Irene Shipper, MD;  Location: [facility];  Service: Endoscopy;;   COLONOSCOPY W/ POLYPECTOMY  2009   X 2; Dr.Perry    ERCP N/A 11/05/2016   Procedure: ENDOSCOPIC RETROGRADE CHOLANGIOPANCREATOGRAPHY (ERCP);  Surgeon: Irene Shipper, MD;  Location: [facility] ENDOSCOPY;  Service: Endoscopy;  Laterality:  N/A;   ESOPHAGOGASTRODUODENOSCOPY (EGD) WITH PROPOFOL N/A 07/29/2016   Procedure: ESOPHAGOGASTRODUODENOSCOPY (EGD) WITH PROPOFOL;  Surgeon: Irene Shipper, MD;  Location: [facility]OS[facility];  Service: Endoscopy;  Laterality: N/A;  will need ERCP scope   ESOPHAGOGASTRODUODENOSCOPY (EGD) WITH PROPOFOL N/A 01/06/2018   Procedure: ESOPHAGOGASTRODUODENOSCOPY (EGD) WITH PROPOFOL;  Surgeon: Irene Shipper, MD;  Location: [facility]OS[facility];  Service: Endoscopy;  Laterality: N/A;  NEED ERCP SCOPE   ESOPHAGOGASTRODUODENOSCOPY (EGD) WITH PROPOFOL N/A 01/03/2020   Procedure: ESOPHAGOGASTRODUODENOSCOPY (EGD) WITH PROPOFOL;  Surgeon: Irene Shipper, MD;  Location: [facility] ENDOS[facility];  Service: Endoscopy;  Laterality: N/A;  Needs ERCP scope   HERNIA REPAIR     LUMBAR LAMINECTOMY  07/2008   UPPER GASTROINTESTINAL ENDOSCOPY      gastric polyp;Dr.Perry    VASECTOMY     WHIPPLE PROCEDURE  2012   partial ; [facility]    Family History  Problem Relation Age of Onset   Throat cancer Father        smoker   Esophageal cancer Father    Prostate cancer Paternal Uncle    Heart attack Paternal Uncle        3 uncles, >55   Coronary artery disease Brother        in 55s   Esophageal cancer Brother    Skin cancer Mother  Throat cancer Brother    Heart disease Brother    Uterine cancer Sister    COPD Sister        X2   Ovarian cancer Sister    Stroke Paternal Grandmother 25   Colon cancer Paternal Grandfather    Heart disease Paternal Grandfather    Heart disease Maternal Grandfather    Throat cancer Sister        smoker   Breast cancer Sister    Lung cancer Sister        smoker   Leukemia Sister    Heart disease Brother    Asthma Neg Hx     Social History   Socioeconomic History   Marital status: Married    Spouse name: Not on file   Number of children: 2   Years of education: Not on file   Highest education level: Not on file  Occupational History   Occupation: Land[employer]    Comment: Retired  Tobacco Use   Smoking status: Former    Packs/day: 2.00    Years: 50.00    Pack years: 100.00    Types: Cigarettes    Quit date: 07/08/1997    Years since quitting: 24.0   Smokeless tobacco: Never  Substance and Sexual Activity   Alcohol use: No    Alcohol/week: 0.0 standard drinks   Drug use: No   Sexual activity: Yes  Other Topics Concern   Not on file  Social History Narrative   Has living will   Requests daughters as health care POA   Would accept resuscitation attempts   Not sure about tube feeds   Social Determinants of Health   Financial Resource Strain: Not on file  Food Insecurity: Not on file  Transportation Needs: Not on file  Physical Activity: Not on file  Stress: Not on file  Social Connections: Not on file  Intimate Partner Violence: Not on file    Outpatient Medications Prior to Visit  Medication Sig Dispense Refill   albuterol (PROVENTIL) (2.5 MG/3ML) 0.083% nebulizer solution Take 3 mLs (2.5 mg total) by nebulization every 6 (six) hours as needed for wheezing or shortness of breath. 360 mL 3   albuterol (VENTOLIN HFA) 108 (90 Base) MCG/ACT inhaler Inhale 2 puffs into the lungs every 6 (six) hours as needed for wheezing or shortness of breath. 3 each 3   alfuzosin (UROXATRAL) 10 MG 24 hr tablet TAKE 1 TABLET(10 MG) BY MOUTH DAILY 90 tablet 3   amLODipine (NORVASC) 5 MG tablet Take 1 tablet (5 mg total) by mouth daily. 90 tablet 3   aspirin-acetaminophen-caffeine (EXCEDRIN MIGRAINE) 250-250-65 MG tablet Take 2 tablets by mouth 3 (three) times daily as needed for headache.     azelastine (ASTELIN) 0.1 % nasal spray Place 1 spray into both nostrils 2 (two) times daily. Use in each nostril as directed 90 mL 3   azelastine (ASTELIN) 0.1 % nasal spray Place into the nose.     Budeson-Glycopyrrol-Formoterol (BREZTRI AEROSPHERE) 160-9-4.8 MCG/ACT AERO Inhale 2 puffs into the lungs in the morning and at bedtime. 30 g  3   Certolizumab Pegol (CIMZIA) 2 X 200 MG KIT Inject 2 each into the skin every 30 (thirty) days.     cetirizine (ZYRTEC) 10 MG tablet Take 1 tablet (10 mg total) by mouth at bedtime. 90 tablet 3   doxycycline (MONODOX) 50 MG capsule      erythromycin ophthalmic ointment Place 1 application into both eyes  daily as needed (dry eyes).     erythromycin ophthalmic ointment Apply to eye.     ferrous sulfate 325 (65 FE) MG tablet Take 1 tablet (325 mg total) by mouth 3 (three) times daily with meals. 270 tablet 3   finasteride (PROSCAR) 5 MG tablet Take 1 tablet (5 mg total) by mouth daily. 90 tablet 3   fluticasone (FLONASE) 50 MCG/ACT nasal spray Place 2 sprays into both nostrils daily as needed for allergies. 48 g 3   folic acid (FOLVITE) 1 MG tablet Take 1 mg by mouth daily.   3   furosemide (LASIX) 40 MG tablet Take 1 tablet (40 mg total) by mouth daily as needed. For increased leg swelling 90 tablet 3   gabapentin (NEURONTIN) 300 MG capsule Take 300-600 mg by mouth See admin instructions. Take 1 capsule (300 mg) by mouth in the morning & take 2 capsules (600 mg) by mouth at night.     losartan (COZAAR) 100 MG tablet Take 1 tablet (100 mg total) by mouth daily. 90 tablet 3   montelukast (SINGULAIR) 10 MG tablet Take 1 tablet (10 mg total) by mouth at bedtime. 90 tablet 3   Multiple Vitamin (MULTIVITAMIN WITH MINERALS) TABS tablet Take 1 tablet by mouth daily.     omeprazole (PRILOSEC) 20 MG capsule Take 1 capsule (20 mg total) by mouth daily. 90 capsule 3   potassium chloride SA (KLOR-CON) 20 MEQ tablet Take 1 tablet (20 mEq total) by mouth daily. 90 tablet 3   predniSONE (DELTASONE) 5 MG tablet Take 5 mg by mouth See admin instructions. Take 1 tablet daily     roflumilast (DALIRESP) 500 MCG TABS tablet Take 1 tablet (500 mcg total) by mouth daily. 90 tablet 3   No facility-administered medications prior to visit.    Allergies  Allergen Reactions   Morphine And Related  Other (See Comments)    Itching & rash Because of a history of documented adverse serious drug reaction;Medi Alert bracelet  is recommended    Review of Systems  Constitutional:  Negative for chills and fever.  Respiratory:  Negative for cough and shortness of breath.   Cardiovascular:  Negative for chest pain.  Gastrointestinal:  Positive for abdominal pain and nausea. Negative for diarrhea and vomiting.  Genitourinary:  Negative for dysuria, frequency and hematuria.       Nocturia 1-2 times nightly. Baseline per patient report  Musculoskeletal:  Positive for back pain.  Neurological:  Negative for weakness and numbness.      Objective:    Physical Exam Vitals and nursing note reviewed.  Constitutional:      Appearance: He is obese.  Cardiovascular:     Rate and Rhythm: Normal rate and regular rhythm.     Pulses: Normal pulses.     Heart sounds: Normal heart sounds.  Pulmonary:     Effort: Pulmonary effort is normal.     Breath sounds: Normal breath sounds.  Abdominal:     General: Bowel sounds are normal. There is distension.     Palpations: There is no mass.     Tenderness: There is no abdominal tenderness. There is no guarding or rebound.     Hernia: A hernia is present.    Musculoskeletal:     Lumbar back: No tenderness or bony tenderness. Positive left straight leg raise test. Negative right straight leg raise test.     Comments: Pain made worse with lying left leg raise. Pain elicited again on PROM but  not as prominent   Skin:    General: Skin is warm.  Neurological:     Mental Status: He is alert. Mental status is at baseline.  Psychiatric:        Mood and Affect: Mood normal.        Behavior: Behavior normal.        Thought Content: Thought content normal.        Judgment: Judgment normal.    BP 118/66    Pulse 100    Temp 98.1 F (36.7 C)    Resp 18    Ht '5\' 7"'  (1.702 m)    Wt 197 lb 2 oz (89.4 kg)    SpO2 95%    BMI 30.87 kg/m  Wt Readings from Last 3  Encounters:  07/04/21 194 lb 0.1 oz (88 kg)  06/19/21 203 lb 9.6 oz (92.4 kg)  05/15/21 208 lb (94.3 kg)    Health Maintenance Due  Topic Date Due   Zoster Vaccines- Shingrix (1 of 2) Never done   COVID-19 Vaccine (3 - Pfizer risk series) 09/17/2019   TETANUS/TDAP  08/08/2020    There are no preventive care reminders to display for this patient.   Lab Results  Component Value Date   TSH 1.42 04/04/2014   Lab Results  Component Value Date   WBC 6.8 07/04/2021   HGB 15.6 07/04/2021   HCT 47.4 07/04/2021   MCV 89.9 07/04/2021   PLT 253 07/04/2021   Lab Results  Component Value Date   NA 140 07/04/2021   K 3.5 07/04/2021   CO2 25 07/04/2021   GLUCOSE 113 (H) 07/04/2021   BUN 13 07/04/2021   CREATININE 1.25 (H) 07/04/2021   BILITOT 1.0 07/04/2021   ALKPHOS 58 07/04/2021   AST 12 (L) 07/04/2021   ALT 12 07/04/2021   PROT 6.8 07/04/2021   ALBUMIN 4.1 07/04/2021   CALCIUM 9.6 07/04/2021   ANIONGAP 10 07/04/2021   GFR 63.67 05/15/2021   Lab Results  Component Value Date   CHOL 140 10/13/2019   Lab Results  Component Value Date   HDL 54 10/13/2019   Lab Results  Component Value Date   LDLCALC 69 10/13/2019   Lab Results  Component Value Date   TRIG 87 10/13/2019   Lab Results  Component Value Date   CHOLHDL 2.6 10/13/2019   Lab Results  Component Value Date   HGBA1C 5.7 04/04/2014       Assessment & Plan:   Problem List Items Addressed This Visit       Other   Elevated serum creatinine    Slightly elevated creatinine in the emergency department.  Likely due to mild dehydration will redraw today to give appropriate Tylenol dosing in regards to abdominal pain.  Pending lab results      Umbilical hernia without obstruction and without gangrene    Finding on exam for the emergency department finding on exam in office today CT of abdomen pelvis performed in the emergency department on 07/04/2021 showed 2 hernias without bowel entrapment.  This  could be causing some of her discomfort after joint discussion patient and daughter would like to see surgery for consult about repairing hernias.  Ambulatory referral placed signs and symptoms reviewed as well as seek emergent health care in regards to hernias.      Lower abdominal pain - Primary    Experiencing lower abdominal pain for approximately 2 weeks.  Was seen in emergency department on 07/04/2021.  CT abdomen  pelvis was negative for acute findings.  He does have referral for GI on 07/16/2021.  Did recommend trying a probiotic to help with the distention and bloating of his belly.  Do think this is multifactorial in regards to back issues and nerve involvement.  He is followed by Dr. Dr. Charlestine Night and is on gabapentin.  Continue those medications.  CT abdomen pelvis did note degenerative disease of the spine but no other acute abnormality as a CT was not focused on patient's spine.      Relevant Orders   Basic metabolic panel   Ambulatory referral to General Surgery     No orders of the defined types were placed in this encounter.  This visit occurred during the SARS-CoV-2 public health emergency.  Safety protocols were in place, including screening questions prior to the visit, additional usage of staff PPE, and extensive cleaning of exam room while observing appropriate contact time as indicated for disinfecting solutions.    Romilda Garret, NP

## 2021-07-05 NOTE — Assessment & Plan Note (Signed)
Finding on exam for the emergency department finding on exam in office today CT of abdomen pelvis performed in the emergency department on 07/04/2021 showed 2 hernias without bowel entrapment.  This could be causing some of her discomfort after joint discussion patient and daughter would like to see surgery for consult about repairing hernias.  Ambulatory referral placed signs and symptoms reviewed as well as seek emergent health care in regards to hernias.

## 2021-07-05 NOTE — Assessment & Plan Note (Signed)
Experiencing lower abdominal pain for approximately 2 weeks.  Was seen in emergency department on 07/04/2021.  CT abdomen pelvis was negative for acute findings.  He does have referral for GI on 07/16/2021.  Did recommend trying a probiotic to help with the distention and bloating of his belly.  Do think this is multifactorial in regards to back issues and nerve involvement.  He is followed by Dr. Dr. Charlestine Night and is on gabapentin.  Continue those medications.  CT abdomen pelvis did note degenerative disease of the spine but no other acute abnormality as a CT was not focused on patient's spine.

## 2021-07-05 NOTE — Patient Instructions (Signed)
Nice to see you  I will be in touch with the labs and guidance on the use of tylenol.  I would get a probiotic and try it Follow up with GI

## 2021-07-05 NOTE — Assessment & Plan Note (Signed)
Slightly elevated creatinine in the emergency department.  Likely due to mild dehydration will redraw today to give appropriate Tylenol dosing in regards to abdominal pain.  Pending lab results

## 2021-07-05 NOTE — Telephone Encounter (Signed)
Spoke to pt. He is scheduled to see [facility] today.

## 2021-07-09 ENCOUNTER — Encounter: Payer: Self-pay | Admitting: Oncology

## 2021-07-16 ENCOUNTER — Encounter: Payer: Self-pay | Admitting: Internal Medicine

## 2021-07-16 ENCOUNTER — Ambulatory Visit (INDEPENDENT_AMBULATORY_CARE_PROVIDER_SITE_OTHER): Payer: Medicare Other | Admitting: Internal Medicine

## 2021-07-16 VITALS — BP 138/66 | HR 79 | Ht 67.0 in | Wt 198.0 lb

## 2021-07-16 DIAGNOSIS — R1310 Dysphagia, unspecified: Secondary | ICD-10-CM | POA: Diagnosis not present

## 2021-07-16 DIAGNOSIS — R1032 Left lower quadrant pain: Secondary | ICD-10-CM | POA: Diagnosis not present

## 2021-07-16 DIAGNOSIS — D132 Benign neoplasm of duodenum: Secondary | ICD-10-CM

## 2021-07-16 DIAGNOSIS — K429 Umbilical hernia without obstruction or gangrene: Secondary | ICD-10-CM

## 2021-07-16 DIAGNOSIS — K5900 Constipation, unspecified: Secondary | ICD-10-CM | POA: Diagnosis not present

## 2021-07-16 NOTE — Patient Instructions (Signed)
If you are age 79 or older, your body mass index should be between 23-30. Your Body mass index is 31.01 kg/m. If this is out of the aforementioned range listed, please consider follow up with your Primary Care Provider.  If you are age 81 or younger, your body mass index should be between 19-25. Your Body mass index is 31.01 kg/m. If this is out of the aformentioned range listed, please consider follow up with your Primary Care Provider.   ________________________________________________________  The [facility] providers would like to encourage you to use [facility] to communicate with providers for non-urgent requests or questions.  Due to long hold times on the telephone, sending your provider a message by [facility] may be a faster and more efficient way to get a response.  Please allow 48 business hours for a response.  Please remember that this is for non-urgent requests.  _______________________________________________________  Take 2 tablespoons of Citrucel in 12-14 ounces of water or juice daily.  You have been scheduled for an endoscopy. Please follow written instructions given to you at your visit today. If you use inhalers (even only as needed), please bring them with you on the day of your procedure.

## 2021-07-17 DIAGNOSIS — M25552 Pain in left hip: Secondary | ICD-10-CM | POA: Diagnosis not present

## 2021-07-17 DIAGNOSIS — M5459 Other low back pain: Secondary | ICD-10-CM | POA: Diagnosis not present

## 2021-07-18 ENCOUNTER — Encounter: Payer: Self-pay | Admitting: Internal Medicine

## 2021-07-18 NOTE — Progress Notes (Signed)
HISTORY OF PRESENT ILLNESS:  Thomas Hudson is a 81 y.o. male with past medical history as listed below who has been followed in this office for GERD, surveillance colonoscopy, and ampullary adenoma status postresection.  He presents today with his daughter with complaints of left lower quadrant pain, difficulty with bowel habits (constipation), and globus type sensation.  He tells me that he has a sensation of something in his pharynx.  This has been a longstanding complaint.  Sometimes has difficulty swallowing pills and saliva.  Previous endoscopy for this complaint did not reveal any abnormalities.  He is concerned about cancer.  He states that his sister had a similar complaint and was found to have a tumor outside of the esophagus.  Next, he complains of intermittent left lower quadrant pain associated with sensation of incomplete evacuation of his bowels.  He generally goes each day but has to strain.  He also notices some back pain.  He was evaluated in the emergency department July 04, 2021 regarding left lower quadrant abdominal pain.  Contrast-enhanced CT scan of the abdomen and pelvis performed that day revealed 2 ventral hernias around the umbilicus.  There was diverticulosis without diverticulitis.  No acute abnormalities.  Blood work including CBC and liver tests as well as lipase were unremarkable.  The cause of his pain was uncertain.  He has tried MiraLAX for his strained bowels.  This has resulted in loose stools.  In terms of his pharyngeal complaints, it should be noted that he did undergo a barium esophagram April 2022.  This was unremarkable.  His last upper endoscopy with side-viewing scope June 2021 did not reveal any residual adenoma.  Follow-up in 3 years recommended.  His last complete colonoscopy May 2016 revealed diverticulosis.  No neoplasia.  He has had multiple colonoscopies prior to that exam.  He does take omeprazole for GERD  REVIEW OF SYSTEMS:  All non-GI ROS negative  unless otherwise stated in the HPI except for sinus and allergy trouble, arthritis, back pain, cough, lower extremity edema  Past Medical History:  Diagnosis Date   BPH (benign prostatic hyperplasia)    Carpal tunnel syndrome on both sides    COPD (chronic obstructive pulmonary disease) (HCC)    GERD (gastroesophageal reflux disease)    Hyperlipidemia    Hypertension    RAD (reactive airway disease)    no PMH of asthma; RAD post infection   Rheumatoid arthritis ([place])         Past Surgical History:  Procedure Laterality Date   BIOPSY  01/06/2018   Procedure: BIOPSY;  Surgeon: Irene Shipper, MD;  [facility];  Service: Endoscopy;;   BIOPSY  01/03/2020   Procedure: BIOPSY;  Surgeon: Irene Shipper, MD;  Location: [facility];  Service: Endoscopy;;   COLONOSCOPY W/ POLYPECTOMY  2009   X 2; Dr.Ariel Wingrove    ERCP N/A 11/05/2016   Procedure: ENDOSCOPIC RETROGRADE CHOLANGIOPANCREATOGRAPHY (ERCP);  Surgeon: Irene Shipper, MD;  Location: [facility] ENDOSCOPY;  Service: Endoscopy;  Laterality: N/A;   ESOPHAGOGASTRODUODENOSCOPY (EGD) WITH PROPOFOL N/A 07/29/2016   Procedure: ESOPHAGOGASTRODUODENOSCOPY (EGD) WITH PROPOFOL;  Surgeon: Irene Shipper, MD;  [facility];  Service: Endoscopy;  Laterality: N/A;  will need ERCP scope   ESOPHAGOGASTRODUODENOSCOPY (EGD) WITH PROPOFOL N/A 01/06/2018   Procedure: ESOPHAGOGASTRODUODENOSCOPY (EGD) WITH PROPOFOL;  Surgeon: Irene Shipper, MD;  [facility];  Service: Endoscopy;  Laterality: N/A;  NEED ERCP SCOPE   ESOPHAGOGASTRODUODENOSCOPY (EGD) WITH PROPOFOL N/A 01/03/2020   Procedure:  ESOPHAGOGASTRODUODENOSCOPY (EGD) WITH PROPOFOL;  Surgeon: Irene Shipper, MD;  Location: [facility];  Service: Endoscopy;  Laterality: N/A;  Needs ERCP scope   HERNIA REPAIR     LUMBAR LAMINECTOMY  07/2008   UPPER GASTROINTESTINAL ENDOSCOPY      gastric polyp;Dr.Jerry Clyne    VASECTOMY     WHIPPLE PROCEDURE  2012   partial ; [facility]    Social History Thomas Hudson   reports that he quit smoking about 24 years ago. His smoking use included cigarettes. He has a 100.00 pack-year smoking history. He has never used smokeless tobacco. He reports that he does not drink alcohol and does not use drugs.  family history includes Breast cancer in his sister; COPD in his sister; Colon cancer in his paternal grandfather; Coronary artery disease in his brother; Esophageal cancer in his father; Heart attack in his paternal uncle; Heart disease in his brother, brother, maternal grandfather, and paternal grandfather; Leukemia in his sister; Lung cancer in his brother and sister; Ovarian cancer in his sister; Prostate cancer in his paternal uncle; Skin cancer in his mother; Stroke (age of onset: 86) in his paternal grandmother; Throat cancer in his brother, father, and sister; Uterine cancer in his sister.  Allergies  Allergen Reactions   Morphine And Related Other (See Comments)    Itching & rash Because of a history of documented adverse serious drug reaction;Medi Alert bracelet  is recommended       PHYSICAL EXAMINATION: Vital signs: BP 138/66    Pulse 79    Ht '5\' 7"'  (1.702 m)    Wt 198 lb (89.8 kg)    SpO2 97%    BMI 31.01 kg/m   Constitutional: generally well-appearing, no acute distress Psychiatric: alert and oriented x3, cooperative Eyes: extraocular movements intact, anicteric, conjunctiva pink Mouth: oral pharynx moist, no lesions Neck: supple no lymphadenopathy Cardiovascular: heart regular rate and rhythm, no murmur Lungs: clear to auscultation bilaterally Abdomen: soft, nontender, nondistended, no obvious ascites, no peritoneal signs, normal bowel sounds, no organomegaly.  Umbilical and periumbilical hernias noted Rectal: Omitted Extremities: no clubbing, cyanosis, or lower extremity edema bilaterally Skin: no lesions on visible extremities Neuro: No focal deficits.  Cranial nerves intact  ASSESSMENT:  1.  Left-sided abdominal discomfort.  Likely related  to strain bowel movements.  Negative ER evaluation 2.  Umbilical hernias 3.  GERD.  Classic symptoms controlled on PPI 4.  Globus type sensation 5.  Remote history of colon polyps.  Last examination 2016 with diverticulosis only 6.  History of ampullary adenoma status postresection.  Last surveillance endoscopy negative for neoplasia  PLAN:  1.  Recommend Citrucel 2 tablespoons daily in 14 ounces of water or juice to see if this improves his bowel habits and discomfort 2.  Schedule diagnostic upper endoscopy for ongoing globus type complaint.The nature of the procedure, as well as the risks, benefits, and alternatives were carefully and thoroughly reviewed with the patient. Ample time for discussion and questions allowed. The patient understood, was satisfied, and agreed to proceed.  3.  If upper endoscopy negative, then CT of the neck. 4.  Continue PPI 5.  Surveillance endoscopy with side-viewing scope around June 2024 Total time of 35 minutes was spent preparing to see the patient, reviewing a myriad of tests, obtaining comprehensive history, performing medically appropriate physical exam, counseling the patient and his daughter regarding the above listed issues, ordering endoscopic procedure, and documenting clinical information in the health record

## 2021-07-19 ENCOUNTER — Encounter: Payer: Self-pay | Admitting: Internal Medicine

## 2021-07-19 ENCOUNTER — Other Ambulatory Visit: Payer: Self-pay

## 2021-07-19 ENCOUNTER — Ambulatory Visit (INDEPENDENT_AMBULATORY_CARE_PROVIDER_SITE_OTHER): Payer: Medicare Other | Admitting: Internal Medicine

## 2021-07-19 VITALS — BP 138/60 | HR 93 | Temp 97.9°F | Ht 69.0 in | Wt 198.4 lb

## 2021-07-19 DIAGNOSIS — J320 Chronic maxillary sinusitis: Secondary | ICD-10-CM | POA: Diagnosis not present

## 2021-07-19 DIAGNOSIS — J410 Simple chronic bronchitis: Secondary | ICD-10-CM

## 2021-07-19 NOTE — Patient Instructions (Addendum)
Please schedule follow up scheduled with myself in 3 months.  If my schedule is not open yet, we will contact you with a reminder closer to that time. Please call [phone] if you haven't heard from [place] a month before.   Continue breztri inhaler and stop the trelegy  Let me know if sinus issues persist and we can refer to ENT.

## 2021-07-19 NOTE — Progress Notes (Signed)
WADSWORTH SKOLNICK    [id number]    1940-09-26  Primary Care Physician:Letvak, Theophilus Kinds, MD Date of Appointment: 07/19/2021 Established Patient Visit  Chief complaint:   Chief Complaint  Patient presents with   Follow-up    DOE     HPI: Thomas Hudson is a 81 y.o. gentleman with history of allergic rhinitis who presented Nov 2021 with shortness of breath and was started on albuterol prn. Diagnosed with COPD. Symptom onset was around 2009, no childhood respiratory disease or asthma.   Interval Updates: Here with his daughter.   Having worsening sinus congestion and head pain - now having relief after a course of prednisone for his back - improved with this.   He has not yet start breztri - the initial sample helped but his pharmacy hasn't been able to fill it. Still taking trelegy.   Has lost weight unintentionally - last 7-8 lbs since last visit.  Decreased appetite.   Seeing GI for dysphagia. Endoscopy due later this month, possible esophageal dilation depending on what they see.    On astelin, flonase, singulair and cetirizine for chronic allergic rhinitis. He does feel these are helping.   At baseline on 5 mg prednisone for his RA. Taking infusion therapy for his RA at [facility] - sees Dr. Dossie Der.    Takes albuterol inhaler or nebulizer every other day.   Social history: 100 pack year history  I have reviewed the patient's family social and past medical history and updated as appropriate.   Past Medical History:  Diagnosis Date   BPH (benign prostatic hyperplasia)    Carpal tunnel syndrome on both sides    COPD (chronic obstructive pulmonary disease) (HCC)    GERD (gastroesophageal reflux disease)    Hyperlipidemia    Hypertension    RAD (reactive airway disease)    no PMH of asthma; RAD post infection   Rheumatoid arthritis [facility])         Past Surgical History:  Procedure Laterality Date   BIOPSY  01/06/2018   Procedure: BIOPSY;  Surgeon:  Irene Shipper, MD;  Location: [facility];  Service: Endoscopy;;   BIOPSY  01/03/2020   Procedure: BIOPSY;  Surgeon: Irene Shipper, MD;  Location: [facility];  Service: Endoscopy;;   COLONOSCOPY W/ POLYPECTOMY  2009   X 2; Dr.Perry    ERCP N/A 11/05/2016   Procedure: ENDOSCOPIC RETROGRADE CHOLANGIOPANCREATOGRAPHY (ERCP);  Surgeon: Irene Shipper, MD;  Location: [facility] ENDOSCOPY;  Service: Endoscopy;  Laterality: N/A;   ESOPHAGOGASTRODUODENOSCOPY (EGD) WITH PROPOFOL N/A 07/29/2016   Procedure: ESOPHAGOGASTRODUODENOSCOPY (EGD) WITH PROPOFOL;  Surgeon: Irene Shipper, MD;  Location: [facility];  Service: Endoscopy;  Laterality: N/A;  will need ERCP scope   ESOPHAGOGASTRODUODENOSCOPY (EGD) WITH PROPOFOL N/A 01/06/2018   Procedure: ESOPHAGOGASTRODUODENOSCOPY (EGD) WITH PROPOFOL;  Surgeon: Irene Shipper, MD;  Location: [facility];  Service: Endoscopy;  Laterality: N/A;  NEED ERCP SCOPE   ESOPHAGOGASTRODUODENOSCOPY (EGD) WITH PROPOFOL N/A 01/03/2020   Procedure: ESOPHAGOGASTRODUODENOSCOPY (EGD) WITH PROPOFOL;  Surgeon: Irene Shipper, MD;  Location: [facility];  Service: Endoscopy;  Laterality: N/A;  Needs ERCP scope   HERNIA REPAIR     LUMBAR LAMINECTOMY  07/2008   UPPER GASTROINTESTINAL ENDOSCOPY      gastric polyp;Dr.Perry    VASECTOMY     WHIPPLE PROCEDURE  2012   partial ; [facility]    Family History  Problem Relation Age of Onset   Skin cancer Mother  Throat cancer Father        smoker   Esophageal cancer Father    Uterine cancer Sister    COPD Sister        X2   Ovarian cancer Sister    Throat cancer Sister        smoker   Breast cancer Sister    Lung cancer Sister        smoker   Leukemia Sister    Coronary artery disease Brother        in 66s   Lung cancer Brother        kidney cancer   Throat cancer Brother    Heart disease Brother    Heart disease Brother    Heart disease Maternal Grandfather    Stroke Paternal Grandmother 11   Colon cancer Paternal Grandfather    Heart  disease Paternal Grandfather    Prostate cancer Paternal Uncle    Heart attack Paternal Uncle        3 uncles, >55   Asthma Neg Hx     Social History   Occupational History   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[employer]    Comment: Retired  Tobacco Use   Smoking status: Former    Packs/day: 2.00    Years: 50.00    Pack years: 100.00    Types: Cigarettes    Quit date: 07/08/1997    Years since quitting: 24.0   Smokeless tobacco: Never  Vaping Use   Vaping Use: Never used  Substance and Sexual Activity   Alcohol use: No    Alcohol/week: 0.0 standard drinks   Drug use: No   Sexual activity: Yes     Physical Exam: Blood pressure 138/60, pulse 93, temperature 97.9 F (36.6 C), temperature source Oral, height 5\' 9"  (1.753 m), weight 198 lb 6.4 oz (90 kg), SpO2 96 %.  Gen:     No acute distress ENT: nares patent, no polyps, mild mucosal debris, more open on the right than on the left  Lungs:   diminished bilaterally no wheezes or crackles CV:        RRR, no mrg   Data Reviewed: Imaging: I have personally reviewed the CT Chest Cardiac with limited lung windows in Feb 2021 - lung windows available are clear without nodules of effusion.   Chest xray obtained 02/08/21 personally reviewed today - no acute process  Reviewed his CT maxillofacial from 2013 and 2017 with chronic sinusitis.   PFTs: Mild restriction to ventilation Jan 2022  Labs: Lab Results  Component Value Date   WBC 6.8 07/04/2021   HGB 15.6 07/04/2021   HCT 47.4 07/04/2021   MCV 89.9 07/04/2021   PLT 253 07/04/2021   Lab Results  Component Value Date   NA 138 07/05/2021   K 3.8 07/05/2021   CL 102 07/05/2021   CO2 28 07/05/2021     Immunization status: Immunization History  Administered Date(s) Administered   Fluad Quad(high Dose 65+) 05/12/2019, 05/12/2020   Influenza Split 06/25/2011, 04/15/2012   Influenza Whole 05/04/2009, 06/06/2010   Influenza, High Dose Seasonal PF 05/03/2013    Influenza, Quadrivalent, Recombinant, Inj, Pf 03/28/2021   Influenza,inj,Quad PF,6+ Mos 04/07/2014, 04/03/2015, 04/19/2016, 05/02/2017   Influenza-Unspecified 05/13/2012, 04/07/2014, 04/21/2018, 04/11/2021   PFIZER(Purple Top)SARS-COV-2 Vaccination 07/30/2019, 08/20/2019   Pneumococcal Conjugate-13 04/07/2014   Pneumococcal Polysaccharide-23 05/04/2009, 04/07/2014, 05/02/2017   Td 08/08/2010   Zoster, Live 08/10/2012    Assessment:  COPD, stable control. Chronic Rhinitis - worsening control  History of 100 pack year smoking, quit 1999  Plan/Recommendations: Continue albuterol Continue breztri - [facility]. Currently on trelegy until he can get this. Continue singulair, flonase, zyrtec for rhinitis, with nasal saline. continue astelin nasal spray for ongoing rhinitis Continue daliresp for now.   Refer to ent if sinusitis persists Return to Care: Return in about 3 months (around 10/17/2021).   Lenice Llamas, MD Pulmonary and [place]

## 2021-07-24 ENCOUNTER — Encounter: Payer: Self-pay | Admitting: Oncology

## 2021-07-25 DIAGNOSIS — R3912 Poor urinary stream: Secondary | ICD-10-CM | POA: Diagnosis not present

## 2021-07-25 DIAGNOSIS — R351 Nocturia: Secondary | ICD-10-CM | POA: Diagnosis not present

## 2021-07-25 DIAGNOSIS — N403 Nodular prostate with lower urinary tract symptoms: Secondary | ICD-10-CM | POA: Diagnosis not present

## 2021-07-31 DIAGNOSIS — M069 Rheumatoid arthritis, unspecified: Secondary | ICD-10-CM | POA: Diagnosis not present

## 2021-08-01 DIAGNOSIS — M150 Primary generalized (osteo)arthritis: Secondary | ICD-10-CM | POA: Diagnosis not present

## 2021-08-01 DIAGNOSIS — M0600 Rheumatoid arthritis without rheumatoid factor, unspecified site: Secondary | ICD-10-CM | POA: Diagnosis not present

## 2021-08-01 DIAGNOSIS — M542 Cervicalgia: Secondary | ICD-10-CM | POA: Diagnosis not present

## 2021-08-01 DIAGNOSIS — M5136 Other intervertebral disc degeneration, lumbar region: Secondary | ICD-10-CM | POA: Diagnosis not present

## 2021-08-01 DIAGNOSIS — Z79899 Other long term (current) drug therapy: Secondary | ICD-10-CM | POA: Diagnosis not present

## 2021-08-01 DIAGNOSIS — M1990 Unspecified osteoarthritis, unspecified site: Secondary | ICD-10-CM | POA: Diagnosis not present

## 2021-08-02 ENCOUNTER — Ambulatory Visit (AMBULATORY_SURGERY_CENTER): Payer: Medicare Other | Admitting: Internal Medicine

## 2021-08-02 ENCOUNTER — Encounter: Payer: Self-pay | Admitting: Internal Medicine

## 2021-08-02 VITALS — BP 107/61 | HR 84 | Temp 98.4°F | Resp 18 | Ht 67.0 in | Wt 198.0 lb

## 2021-08-02 DIAGNOSIS — J449 Chronic obstructive pulmonary disease, unspecified: Secondary | ICD-10-CM | POA: Diagnosis not present

## 2021-08-02 DIAGNOSIS — D132 Benign neoplasm of duodenum: Secondary | ICD-10-CM

## 2021-08-02 DIAGNOSIS — K219 Gastro-esophageal reflux disease without esophagitis: Secondary | ICD-10-CM

## 2021-08-02 DIAGNOSIS — I10 Essential (primary) hypertension: Secondary | ICD-10-CM | POA: Diagnosis not present

## 2021-08-02 DIAGNOSIS — R1310 Dysphagia, unspecified: Secondary | ICD-10-CM

## 2021-08-02 MED ORDER — SODIUM CHLORIDE 0.9 % IV SOLN: 500.0000 mL | Freq: Once | INTRAVENOUS | Status: DC

## 2021-08-02 NOTE — Progress Notes (Signed)
Discussed with daughter and pt that pt has COPD and forgot to take his inhalers today.  Pt's oxygen level has been in the low 90s but will go to the upper 80s and then will improve after pt takes deep breaths and coughs.  Pt states he sleeps for 1 hour to 4 hours per night and wakes up frequently.  Pt states he has never had a sleep study.  Let them know to discuss his sleep habits and oxygen levels with his dr.  Abbott Pao states he has an appointment in a couple weeks and they will discuss with him.

## 2021-08-02 NOTE — Progress Notes (Signed)
Pt's states no medical or surgical changes since previsit or office visit.  VS CW  

## 2021-08-02 NOTE — Progress Notes (Signed)
To pacu, VSS. Report to [email or web address] 

## 2021-08-02 NOTE — Patient Instructions (Signed)
Handout given for hiatal hernia and GERD.  YOU HAD AN ENDOSCOPIC PROCEDURE TODAY AT [facility]:   Refer to the procedure report that was given to you for any specific questions about what was found during the examination.  If the procedure report does not answer your questions, please call your gastroenterologist to clarify.  If you requested that your care partner not be given the details of your procedure findings, then the procedure report has been included in a sealed envelope for you to review at your convenience later.  YOU SHOULD EXPECT: Some feelings of bloating in the abdomen. Passage of more gas than usual.  Walking can help get rid of the air that was put into your GI tract during the procedure and reduce the bloating. If you had a lower endoscopy (such as a colonoscopy or flexible sigmoidoscopy) you may notice spotting of blood in your stool or on the toilet paper. If you underwent a bowel prep for your procedure, you may not have a normal bowel movement for a few days.  Please Note:  You might notice some irritation and congestion in your nose or some drainage.  This is from the oxygen used during your procedure.  There is no need for concern and it should clear up in a day or so.  SYMPTOMS TO REPORT IMMEDIATELY:  Following upper endoscopy (EGD)  Vomiting of blood or coffee ground material  New chest pain or pain under the shoulder blades  Painful or persistently difficult swallowing  New shortness of breath  Fever of 100F or higher  Black, tarry-looking stools  For urgent or emergent issues, a gastroenterologist can be reached at any hour by calling [phone]. Do not use MyChart messaging for urgent concerns.    DIET:  We do recommend a small meal at first, but then you may proceed to your regular diet.  Drink plenty of fluids but you should avoid alcoholic beverages for 24 hours.  ACTIVITY:  You should plan to take it easy for the rest of today and you  should NOT DRIVE or use heavy machinery until tomorrow (because of the sedation medicines used during the test).    FOLLOW UP: Our staff will call the number listed on your records 48-72 hours following your procedure to check on you and address any questions or concerns that you may have regarding the information given to you following your procedure. If we do not reach you, we will leave a message.  We will attempt to reach you two times.  During this call, we will ask if you have developed any symptoms of COVID 19. If you develop any symptoms (ie: fever, flu-like symptoms, shortness of breath, cough etc.) before then, please call [phone].  If you test positive for Covid 19 in the 2 weeks post procedure, please call and report this information to [place].    If any biopsies were taken you will be contacted by phone or by letter within the next 1-3 weeks.  Please call us at [phone] if you have not heard about the biopsies in 3 weeks.    SIGNATURES/CONFIDENTIALITY: You and/or your care partner have signed paperwork which will be entered into your electronic medical record.  These signatures attest to the fact that that the information above on your After Visit Summary has been reviewed and is understood.  Full responsibility of the confidentiality of this discharge information lies with you and/or your care-partner.

## 2021-08-02 NOTE — Progress Notes (Signed)
HISTORY OF PRESENT ILLNESS:   Thomas Hudson is a 81 y.o. male with past medical history as listed below who has been followed in this office for GERD, surveillance colonoscopy, and ampullary adenoma status postresection.  He presents today with his daughter with complaints of left lower quadrant pain, difficulty with bowel habits (constipation), and globus type sensation.  He tells me that he has a sensation of something in his pharynx.  This has been a longstanding complaint.  Sometimes has difficulty swallowing pills and saliva.  Previous endoscopy for this complaint did not reveal any abnormalities.  He is concerned about cancer.  He states that his sister had a similar complaint and was found to have a tumor outside of the esophagus.  Next, he complains of intermittent left lower quadrant pain associated with sensation of incomplete evacuation of his bowels.  He generally goes each day but has to strain.  He also notices some back pain.  He was evaluated in the emergency department July 04, 2021 regarding left lower quadrant abdominal pain.  Contrast-enhanced CT scan of the abdomen and pelvis performed that day revealed 2 ventral hernias around the umbilicus.  There was diverticulosis without diverticulitis.  No acute abnormalities.  Blood work including CBC and liver tests as well as lipase were unremarkable.  The cause of his pain was uncertain.  He has tried MiraLAX for his strained bowels.  This has resulted in loose stools.  In terms of his pharyngeal complaints, it should be noted that he did undergo a barium esophagram April 2022.  This was unremarkable.  His last upper endoscopy with side-viewing scope June 2021 did not reveal any residual adenoma.  Follow-up in 3 years recommended.  His last complete colonoscopy May 2016 revealed diverticulosis.  No neoplasia.  He has had multiple colonoscopies prior to that exam.  He does take omeprazole for GERD   REVIEW OF SYSTEMS:   All non-GI ROS  negative unless otherwise stated in the HPI except for sinus and allergy trouble, arthritis, back pain, cough, lower extremity edema       Past Medical History:  Diagnosis Date   BPH (benign prostatic hyperplasia)     Carpal tunnel syndrome on both sides     COPD (chronic obstructive pulmonary disease) (HCC)     GERD (gastroesophageal reflux disease)     Hyperlipidemia     Hypertension     RAD (reactive airway disease)      no PMH of asthma; RAD post infection   Rheumatoid arthritis ([place])                  Past Surgical History:  Procedure Laterality Date   BIOPSY   01/06/2018    Procedure: BIOPSY;  Surgeon: Irene Shipper, MD;  Location: [facility];  Service: Endoscopy;;   BIOPSY   01/03/2020    Procedure: BIOPSY;  Surgeon: Irene Shipper, MD;  Location: [facility];  Service: Endoscopy;;   COLONOSCOPY W/ POLYPECTOMY   2009    X 2; Dr.Luken Shadowens    ERCP N/A 11/05/2016    Procedure: ENDOSCOPIC RETROGRADE CHOLANGIOPANCREATOGRAPHY (ERCP);  Surgeon: Irene Shipper, MD;  Location[facility];  Service: Endoscopy;  Laterality: N/A;   ESOPHAGOGASTRODUODENOSCOPY (EGD) WITH PROPOFOL N/A 07/29/2016    Procedure: ESOPHAGOGASTRODUODENOSCOPY (EGD) WITH PROPOFOL;  Surgeon: Irene Shipper, MD;  Location: [facility];  Service: Endoscopy;  Laterality: N/A;  will need ERCP scope   ESOPHAGOGASTRODUODENOSCOPY (EGD) WITH PROPOFOL N/A 01/06/2018    Procedure: ESOPHAGOGASTRODUODENOSCOPY (  EGD) WITH PROPOFOL;  Surgeon: Irene Shipper, MD;  Location: [facility];  Service: Endoscopy;  Laterality: N/A;  NEED ERCP SCOPE   ESOPHAGOGASTRODUODENOSCOPY (EGD) WITH PROPOFOL N/A 01/03/2020    Procedure: ESOPHAGOGASTRODUODENOSCOPY (EGD) WITH PROPOFOL;  Surgeon: Irene Shipper, MD;  Location: [facility];  Service: Endoscopy;  Laterality: N/A;  Needs ERCP scope   HERNIA REPAIR       LUMBAR LAMINECTOMY   07/2008   UPPER GASTROINTESTINAL ENDOSCOPY         gastric polyp;Dr.Meldon Hanzlik    VASECTOMY       WHIPPLE PROCEDURE   2012     partial ; [facility]      Social History STEIN WINDHORST  reports that he quit smoking about 24 years ago. His smoking use included cigarettes. He has a 100.00 pack-year smoking history. He has never used smokeless tobacco. He reports that he does not drink alcohol and does not use drugs.   family history includes Breast cancer in his sister; COPD in his sister; Colon cancer in his paternal grandfather; Coronary artery disease in his brother; Esophageal cancer in his father; Heart attack in his paternal uncle; Heart disease in his brother, brother, maternal grandfather, and paternal grandfather; Leukemia in his sister; Lung cancer in his brother and sister; Ovarian cancer in his sister; Prostate cancer in his paternal uncle; Skin cancer in his mother; Stroke (age of onset: 60) in his paternal grandmother; Throat cancer in his brother, father, and sister; Uterine cancer in his sister.        Allergies  Allergen Reactions   Morphine And Related Other (See Comments)      Itching & rash Because of a history of documented adverse serious drug reaction;Medi Alert bracelet  is recommended          PHYSICAL EXAMINATION: Vital signs: BP 138/66    Pulse 79    Ht 5\' 7"  (1.702 m)    Wt 198 lb (89.8 kg)    SpO2 97%    BMI 31.01 kg/m   Constitutional: generally well-appearing, no acute distress Psychiatric: alert and oriented x3, cooperative Eyes: extraocular movements intact, anicteric, conjunctiva pink Mouth: oral pharynx moist, no lesions Neck: supple no lymphadenopathy Cardiovascular: heart regular rate and rhythm, no murmur Lungs: clear to auscultation bilaterally Abdomen: soft, nontender, nondistended, no obvious ascites, no peritoneal signs, normal bowel sounds, no organomegaly.  Umbilical and periumbilical hernias noted Rectal: Omitted Extremities: no clubbing, cyanosis, or lower extremity edema bilaterally Skin: no lesions on visible extremities Neuro: No focal deficits.  Cranial nerves intact    ASSESSMENT:   1.  Left-sided abdominal discomfort.  Likely related to strain bowel movements.  Negative ER evaluation 2.  Umbilical hernias 3.  GERD.  Classic symptoms controlled on PPI 4.  Globus type sensation 5.  Remote history of colon polyps.  Last examination 2016 with diverticulosis only 6.  History of ampullary adenoma status postresection.  Last surveillance endoscopy negative for neoplasia   PLAN:   1.  Recommend Citrucel 2 tablespoons daily in 14 ounces of water or juice to see if this improves his bowel habits and discomfort 2.  Schedule diagnostic upper endoscopy for ongoing globus type complaint.The nature of the procedure, as well as the risks, benefits, and alternatives were carefully and thoroughly reviewed with the patient. Ample time for discussion and questions allowed. The patient understood, was satisfied, and agreed to proceed.  3.  If upper endoscopy negative, then CT of the neck. 4.  Continue PPI  5.  Surveillance endoscopy with side-viewing scope around June 2024

## 2021-08-02 NOTE — Op Note (Signed)
[place] Patient Name: Thomas Hudson Procedure Date: 08/02/2021 10:05 AM MRN: [id number] Endoscopist: Docia Chuck. Henrene Pastor , MD Age: 81 Referring MD:  Date of Birth: [birth year] Gender: Male Account #: [id number] Procedure:                Upper GI endoscopy Indications:              Dysphagia, Esophageal reflux Medicines:                Monitored Anesthesia Care Procedure:                Pre-Anesthesia Assessment:                           - Prior to the procedure, a History and Physical                            was performed, and patient medications and                            allergies were reviewed. The patient's tolerance of                            previous anesthesia was also reviewed. The risks                            and benefits of the procedure and the sedation                            options and risks were discussed with the patient.                            All questions were answered, and informed consent                            was obtained. Prior Anticoagulants: The patient has                            taken no previous anticoagulant or antiplatelet                            agents. ASA Grade Assessment: II - A patient with                            mild systemic disease. After reviewing the risks                            and benefits, the patient was deemed in                            satisfactory condition to undergo the procedure.                           After obtaining informed consent, the endoscope was  passed under direct vision. Throughout the                            procedure, the patient's blood pressure, pulse, and                            oxygen saturations were monitored continuously. The                            Endoscope was introduced through the mouth, and                            advanced to the second part of duodenum. The upper                            GI endoscopy was accomplished  without difficulty.                            The patient tolerated the procedure well. Scope In: Scope Out: Findings:                 The posterior pharynx was unremarkable. The                            esophagus was normal.                           The stomach was normal. Small hiatal hernia                           The examined duodenum was normal.                           The cardia and gastric fundus were normal on                            retroflexion. Complications:            No immediate complications. Estimated Blood Loss:     Estimated blood loss: none. Impression:               1. Normal posterior pharynx                           2. Normal EGD save hiatal hernia                           3. GERD                           4. Dysphagia/neck discomfort. Recommendation:           1. Patient has a contact number available for                            emergencies. The signs and symptoms of potential  delayed complications were discussed with the                            patient. Return to normal activities tomorrow.                            Written discharge instructions were provided to the                            patient.                           2. Resume previous diet.                           3. Continue present medications.                           4. PLEASE SCHEDULE CT of the neck "dysphagia, neck                            discomfort, negative endoscopy" Ismar Yabut N. Henrene Pastor, MD 08/02/2021 10:29:18 AM This report has been signed electronically.

## 2021-08-03 ENCOUNTER — Other Ambulatory Visit: Payer: Self-pay

## 2021-08-03 DIAGNOSIS — R1310 Dysphagia, unspecified: Secondary | ICD-10-CM

## 2021-08-06 ENCOUNTER — Telehealth: Payer: Self-pay

## 2021-08-06 ENCOUNTER — Telehealth: Payer: Self-pay | Admitting: *Deleted

## 2021-08-06 NOTE — Telephone Encounter (Signed)
°  Follow up Call-  Call back number 08/02/2021  Post procedure Call Back phone  # [phone]  Permission to leave phone message Yes  Some recent data might be hidden   First follow up call, LVM.

## 2021-08-06 NOTE — Telephone Encounter (Signed)
No answer on 2nd follow up call. Left message

## 2021-08-06 NOTE — Telephone Encounter (Signed)
Patient returned the call states he is well no questions at this time awaiting results

## 2021-08-13 ENCOUNTER — Encounter: Payer: Self-pay | Admitting: Internal Medicine

## 2021-08-13 DIAGNOSIS — H43813 Vitreous degeneration, bilateral: Secondary | ICD-10-CM | POA: Diagnosis not present

## 2021-08-13 MED ORDER — ROFLUMILAST 500 MCG PO TABS: 500.0000 ug | ORAL_TABLET | Freq: Every day | ORAL | 3 refills | Status: DC

## 2021-08-15 DIAGNOSIS — J449 Chronic obstructive pulmonary disease, unspecified: Secondary | ICD-10-CM | POA: Diagnosis not present

## 2021-08-15 DIAGNOSIS — Z87891 Personal history of nicotine dependence: Secondary | ICD-10-CM | POA: Diagnosis not present

## 2021-08-15 DIAGNOSIS — Z636 Dependent relative needing care at home: Secondary | ICD-10-CM | POA: Diagnosis not present

## 2021-08-15 DIAGNOSIS — M48061 Spinal stenosis, lumbar region without neurogenic claudication: Secondary | ICD-10-CM | POA: Diagnosis not present

## 2021-08-15 DIAGNOSIS — K5900 Constipation, unspecified: Secondary | ICD-10-CM | POA: Diagnosis not present

## 2021-08-15 DIAGNOSIS — M5442 Lumbago with sciatica, left side: Secondary | ICD-10-CM | POA: Diagnosis not present

## 2021-08-15 DIAGNOSIS — Z9889 Other specified postprocedural states: Secondary | ICD-10-CM | POA: Diagnosis not present

## 2021-08-17 DIAGNOSIS — Z789 Other specified health status: Secondary | ICD-10-CM | POA: Diagnosis not present

## 2021-08-17 DIAGNOSIS — M5442 Lumbago with sciatica, left side: Secondary | ICD-10-CM | POA: Diagnosis not present

## 2021-08-17 DIAGNOSIS — M48061 Spinal stenosis, lumbar region without neurogenic claudication: Secondary | ICD-10-CM | POA: Diagnosis not present

## 2021-08-17 DIAGNOSIS — Z7409 Other reduced mobility: Secondary | ICD-10-CM | POA: Diagnosis not present

## 2021-08-20 ENCOUNTER — Other Ambulatory Visit: Payer: Self-pay

## 2021-08-20 ENCOUNTER — Ambulatory Visit (HOSPITAL_COMMUNITY)
Admission: RE | Admit: 2021-08-20 | Discharge: 2021-08-20 | Disposition: A | Discharge status: Home / Self Care | Payer: Medicare Other | Source: Ambulatory Visit | Attending: Internal Medicine | Admitting: Internal Medicine

## 2021-08-20 DIAGNOSIS — R1310 Dysphagia, unspecified: Secondary | ICD-10-CM | POA: Diagnosis not present

## 2021-08-20 DIAGNOSIS — M47812 Spondylosis without myelopathy or radiculopathy, cervical region: Secondary | ICD-10-CM | POA: Diagnosis not present

## 2021-08-20 LAB — POCT I-STAT CREATININE: Creatinine, Ser: 1.3 mg/dL — ABNORMAL HIGH (ref 0.61–1.24)

## 2021-08-20 MED ORDER — IOHEXOL 300 MG/ML  SOLN
100.0000 mL | Freq: Once | INTRAMUSCULAR | Status: AC | PRN
  Administered 2021-08-20: 100 mL via INTRAVENOUS

## 2021-08-21 ENCOUNTER — Ambulatory Visit (INDEPENDENT_AMBULATORY_CARE_PROVIDER_SITE_OTHER): Payer: Medicare Other | Admitting: Nurse Practitioner

## 2021-08-21 ENCOUNTER — Ambulatory Visit (INDEPENDENT_AMBULATORY_CARE_PROVIDER_SITE_OTHER): Payer: Medicare Other

## 2021-08-21 ENCOUNTER — Encounter: Payer: Self-pay | Admitting: Nurse Practitioner

## 2021-08-21 VITALS — BP 138/62 | HR 83 | Temp 98.0°F | Ht 67.0 in | Wt 199.8 lb

## 2021-08-21 DIAGNOSIS — M5442 Lumbago with sciatica, left side: Secondary | ICD-10-CM | POA: Diagnosis not present

## 2021-08-21 DIAGNOSIS — R600 Localized edema: Secondary | ICD-10-CM | POA: Diagnosis not present

## 2021-08-21 DIAGNOSIS — J449 Chronic obstructive pulmonary disease, unspecified: Secondary | ICD-10-CM | POA: Diagnosis not present

## 2021-08-21 DIAGNOSIS — Z7409 Other reduced mobility: Secondary | ICD-10-CM | POA: Diagnosis not present

## 2021-08-21 DIAGNOSIS — J439 Emphysema, unspecified: Secondary | ICD-10-CM

## 2021-08-21 DIAGNOSIS — M48061 Spinal stenosis, lumbar region without neurogenic claudication: Secondary | ICD-10-CM | POA: Diagnosis not present

## 2021-08-21 DIAGNOSIS — J301 Allergic rhinitis due to pollen: Secondary | ICD-10-CM | POA: Diagnosis not present

## 2021-08-21 DIAGNOSIS — H269 Unspecified cataract: Secondary | ICD-10-CM

## 2021-08-21 DIAGNOSIS — R0602 Shortness of breath: Secondary | ICD-10-CM | POA: Diagnosis not present

## 2021-08-21 DIAGNOSIS — Z789 Other specified health status: Secondary | ICD-10-CM | POA: Diagnosis not present

## 2021-08-21 DIAGNOSIS — J441 Chronic obstructive pulmonary disease with (acute) exacerbation: Secondary | ICD-10-CM

## 2021-08-21 DIAGNOSIS — R059 Cough, unspecified: Secondary | ICD-10-CM | POA: Diagnosis not present

## 2021-08-21 LAB — BRAIN NATRIURETIC PEPTIDE: Pro B Natriuretic peptide (BNP): 72 pg/mL (ref 0.0–100.0)

## 2021-08-21 MED ORDER — PREDNISONE 10 MG PO TABS: ORAL_TABLET | ORAL | 0 refills | Status: DC

## 2021-08-21 MED ORDER — AMOXICILLIN-POT CLAVULANATE 875-125 MG PO TABS: 1.0000 | ORAL_TABLET | Freq: Two times a day (BID) | ORAL | 0 refills | Status: AC

## 2021-08-21 MED ORDER — BENZONATATE 200 MG PO CAPS: 200.0000 mg | ORAL_CAPSULE | Freq: Three times a day (TID) | ORAL | 0 refills | Status: DC | PRN

## 2021-08-21 NOTE — Patient Instructions (Addendum)
-  Continue Breztri 2 puffs twice daily.  Brush tongue and rinse mouth well afterwards. -Continue Albuterol inhaler 2 puffs or 3 mL neb every 6 hours as needed for shortness of breath or wheezing. Notify if symptoms persist despite rescue inhaler/neb use. -Continue Astelin nasal spray 1 spray each nostril twice daily -Continue Zyrtec 10 mg daily -Continue Singulair 10 mg nightly -Continue Flonase 2 sprays each nostril daily as needed for allergies -Continue omeprazole 20 mg daily -Continue Daliresp 500 mcg daily -Continue prednisone 5 mg daily as directed by rheumatology -Continue mucinex 600 mg Twice daily  -Start prednisone taper. 4 tabs for 2 days, then 3 tabs for 2 days, 2 tabs for 2 days, then 1 tab for 2 days, then return to daily 5 mg dose. Take in AM with food -Start aumgentin 875 mg Twice daily for 7 days. Notify immediately of any rash, itching, hives, or swelling, or seek emergency care.Finish your antibiotics in their entirety. Do not stop just because symptoms improve. Take with food to reduce GI upset. Take daily probiotic over the counter while taking -Tessalon perles (benzonatate) 200 mg Three times a day as needed for cough  Follow up in one month with [name]. If symptoms do not improve or worsen, please contact office for sooner follow up or seek emergency care.

## 2021-08-21 NOTE — Progress Notes (Signed)
_0  ID: Thomas Hudson, male    DOB: 25-Mar-1941, 81 y.o.   MRN: [id number]  Chief Complaint  Patient presents with   Follow-up    Increased coughing x 1-2 weeks.,thick gray, white, increased sob, wheezing.    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  He is a patient of Dr. Mauricio Po and was last seen in office on 07/19/2021.  Past medical history significant for hypertension, venous insufficiency, GERD, BPH, IDA, rheumatoid arthritis.   TEST/EVENTS:  05/20/2016 CT sinuses: Mild mucosal edema throughout the paranasal sinuses.   09/03/2019 CT chest cardiac: Visualized lungs without evidence of acute process or nodules. 02/08/2021 CXR 2 view: Low lung volumes with mild bibasilar atelectasis; otherwise clear.  Cardiomegaly.  07/19/2021: OV with [name].  Previously presented with worsening sinus congestion and head pain-improved with prednisone course.  Had yet to try [facility].  Was still taking Trelegy.  Had some unintentional weight loss and anorexia.  Following with GI for dysphagia.  He is on chronic daily prednisone 5 mg for RA.  Advised to transition from Trelegy to [place] once he can get it filled at the [place].  Continued on Singulair, Flonase, Astelin, Zyrtec and saline irrigation.  Continued on Daliresp.  Plan to refer to ENT if sinusitis persist.  Follow-up in 3 months  08/21/2021: Today-acute visit Patient presents today with daughter for increased cough over the last 2 to 3 weeks.  He describes his cough as productive with thick white/gray to yellow sputum.  He has had some associated increase shortness of breath with exertion and wheezing. He also notes a slight increase in swelling in both his legs. He denies orthopnea, PND, chest pain or palpitations. He did switch from Trelegy to [place] since his last visit. He rarely uses his rescue. He continues on  Daliresp 500 mcg daily. His sinusitis has improved and is well-controlled on current regimen.   He has cataract surgery scheduled for next month. They are unsure about exact details for anesthesia but he was told he would be awake with a mild anesthetic. They have a preop appt scheduled for tomorrow. He was advised that his breathing would need to be stable, without acute flare, before clearance provided.   Allergies  Allergen Reactions   Morphine And Related Other (See Comments)    Itching & rash Because of a history of documented adverse serious drug reaction;Medi Alert bracelet  is recommended    Immunization History  Administered Date(s) Administered   Fluad Quad(high Dose 65+) 05/12/2019, 05/12/2020   Influenza Split 06/25/2011, 04/15/2012   Influenza Whole 05/04/2009, 06/06/2010   Influenza, High Dose Seasonal PF 05/03/2013   Influenza, Quadrivalent, Recombinant, Inj, Pf 03/28/2021   Influenza,inj,Quad PF,6+ Mos 04/07/2014, 04/03/2015, 04/19/2016, 05/02/2017   Influenza-Unspecified 05/13/2012, 04/07/2014, 04/21/2018, 04/11/2021   PFIZER(Purple Top)SARS-COV-2 Vaccination 07/30/2019, 08/20/2019   Pneumococcal Conjugate-13 04/07/2014   Pneumococcal Polysaccharide-23 05/04/2009, 04/07/2014, 05/02/2017   Td 08/08/2010   Zoster, Live 08/10/2012    Past Medical History:  Diagnosis Date   BPH (benign prostatic hyperplasia)    Carpal tunnel syndrome on both sides    COPD (chronic obstructive pulmonary disease) (HCC)    GERD (gastroesophageal reflux disease)    Hyperlipidemia    Hypertension    RAD (reactive airway disease)    no PMH of asthma; RAD post infection   Rheumatoid arthritis ([place])  Tobacco History: Social History   Tobacco Use  Smoking Status Former   Packs/day: 2.00   Years: 50.00   Pack years: 100.00   Types: Cigarettes   Quit date: 07/08/1997   Years since quitting: 24.1  Smokeless Tobacco Never   Counseling given: Not Answered   Outpatient  Medications Prior to Visit  Medication Sig Dispense Refill   albuterol (PROVENTIL) (2.5 MG/3ML) 0.083% nebulizer solution Take 3 mLs (2.5 mg total) by nebulization every 6 (six) hours as needed for wheezing or shortness of breath. 360 mL 3   albuterol (VENTOLIN HFA) 108 (90 Base) MCG/ACT inhaler Inhale 2 puffs into the lungs every 6 (six) hours as needed for wheezing or shortness of breath. 3 each 3   alfuzosin (UROXATRAL) 10 MG 24 hr tablet TAKE 1 TABLET(10 MG) BY MOUTH DAILY 90 tablet 3   amLODipine (NORVASC) 5 MG tablet Take 1 tablet (5 mg total) by mouth daily. 90 tablet 3   aspirin-acetaminophen-caffeine (EXCEDRIN MIGRAINE) 250-250-65 MG tablet Take 2 tablets by mouth 3 (three) times daily as needed for headache.     azelastine (ASTELIN) 0.1 % nasal spray Place 1 spray into both nostrils 2 (two) times daily. Use in each nostril as directed 90 mL 3   Budeson-Glycopyrrol-Formoterol (BREZTRI AEROSPHERE) 160-9-4.8 MCG/ACT AERO Inhale 2 puffs into the lungs in the morning and at bedtime. 30 g 3   Certolizumab Pegol (CIMZIA) 2 X 200 MG KIT Inject 2 each into the skin every 30 (thirty) days.     cetirizine (ZYRTEC) 10 MG tablet Take 1 tablet (10 mg total) by mouth at bedtime. 90 tablet 3   doxycycline (MONODOX) 50 MG capsule Take 50 mg by mouth daily.     erythromycin ophthalmic ointment Place 1 application into both eyes daily as needed (dry eyes).     ferrous sulfate 325 (65 FE) MG tablet Take 1 tablet (325 mg total) by mouth 3 (three) times daily with meals. 270 tablet 3   finasteride (PROSCAR) 5 MG tablet Take 1 tablet (5 mg total) by mouth daily. 90 tablet 3   fluticasone (FLONASE) 50 MCG/ACT nasal spray Place 2 sprays into both nostrils daily as needed for allergies. 48 g 3   folic acid (FOLVITE) 1 MG tablet Take 1 mg by mouth daily.   3   furosemide (LASIX) 40 MG tablet Take 1 tablet (40 mg total) by mouth daily as needed. For increased leg swelling (Patient taking differently: Take 40 mg by  mouth every other day. For increased leg swelling) 90 tablet 3   gabapentin (NEURONTIN) 300 MG capsule Take 300-600 mg by mouth See admin instructions. Take 1 capsule (300 mg) by mouth in the morning & take 2 capsules (600 mg) by mouth at night.     losartan (COZAAR) 100 MG tablet Take 1 tablet (100 mg total) by mouth daily. 90 tablet 3   montelukast (SINGULAIR) 10 MG tablet Take 1 tablet (10 mg total) by mouth at bedtime. 90 tablet 3   Multiple Vitamin (MULTIVITAMIN WITH MINERALS) TABS tablet Take 1 tablet by mouth daily.     omeprazole (PRILOSEC) 20 MG capsule Take 1 capsule (20 mg total) by mouth daily. 90 capsule 3   potassium chloride SA (KLOR-CON M) 20 MEQ tablet Take 1 tablet (20 mEq total) by mouth daily. 90 tablet 3   predniSONE (DELTASONE) 5 MG tablet Take 5 mg by mouth See admin instructions. Take 1 tablet daily     roflumilast (DALIRESP) 500 MCG TABS tablet  Take 1 tablet (500 mcg total) by mouth daily. 90 tablet 3   No facility-administered medications prior to visit.     Review of Systems:   Constitutional: No weight loss or gain, night sweats, fevers, chills, fatigue, or lassitude. HEENT: No headaches, difficulty swallowing, tooth/dental problems, or sore throat. No sneezing, itching, ear ache, nasal congestion, or post nasal drip CV:  +swelling BLE. No chest pain, orthopnea, PND, anasarca, dizziness, palpitations, syncope Resp: +shortness of breath with exertion/coughing spells; productive cough; occasional wheeze. No hemoptysis. No chest wall deformity GI:  No heartburn, indigestion, abdominal pain, nausea, vomiting, diarrhea, change in bowel habits, loss of appetite, bloody stools.  GU: No dysuria, change in color of urine, urgency or frequency.  No flank pain, no hematuria  Skin: No rash, lesions, ulcerations MSK:  No joint pain or swelling.  No decreased range of motion.  No back pain. Neuro: No dizziness or lightheadedness.  Psych: No depression or anxiety. Mood stable.      Physical Exam:  BP 138/62 (BP Location: Right Arm, Cuff Size: Normal)    Pulse 83    Temp 98 F (36.7 C) (Temporal)    Ht _0  (1.702 m)    Wt 199 lb 12.8 oz (90.6 kg)    SpO2 98%    BMI 31.29 kg/m   GEN: Pleasant, interactive, well-appearing; elderly; obese; in no acute distress HEENT:  Normocephalic and atraumatic. EACs patent bilaterally. TM pearly gray with present light reflex bilaterally. PERRLA. Sclera white. Nasal turbinates pink, moist and patent bilaterally. No rhinorrhea present. Oropharynx pink and moist, without exudate or edema. No lesions, ulcerations, or postnasal drip.  NECK:  Supple w/ fair ROM. No JVD present. Normal carotid impulses w/o bruits. Thyroid symmetrical with no goiter or nodules palpated. No lymphadenopathy.   CV: RRR, no m/r/g, no peripheral edema. Pulses intact, +2 bilaterally. No cyanosis, pallor or clubbing. PULMONARY:  Unlabored, regular breathing. Scattered rhonchi bilaterally A&P. No accessory muscle use. No dullness to percussion. GI: BS present and normoactive. Soft, non-tender to palpation. No organomegaly or masses detected. No CVA tenderness. MSK: No erythema, warmth or tenderness. Cap refil <2 sec all extrem. No deformities or joint swelling noted.  Neuro: A/Ox3. No focal deficits noted.   Skin: Warm, no lesions or rashe Psych: Normal affect and behavior. Judgement and thought content appropriate.     Lab Results:  CBC    Component Value Date/Time   WBC 6.8 07/04/2021 1117   RBC 5.27 07/04/2021 1117   HGB 15.6 07/04/2021 1117   HGB 13.9 02/20/2017 0951   HCT 47.4 07/04/2021 1117   HCT 41.5 02/20/2017 0951   PLT 253 07/04/2021 1117   PLT 222 02/20/2017 0951   MCV 89.9 07/04/2021 1117   MCV 90.4 02/20/2017 0951   MCH 29.6 07/04/2021 1117   MCHC 32.9 07/04/2021 1117   RDW 12.8 07/04/2021 1117   RDW 14.7 (H) 02/20/2017 0951   LYMPHSABS 0.5 (L) 11/21/2020 1557   LYMPHSABS 0.7 (L) 02/20/2017 0951   MONOABS 0.6 11/21/2020 1557    MONOABS 0.5 02/20/2017 0951   EOSABS 0.1 11/21/2020 1557   EOSABS 0.2 02/20/2017 0951   BASOSABS 0.0 11/21/2020 1557   BASOSABS 0.0 02/20/2017 0951    BMET    Component Value Date/Time   [place] 138 07/05/2021 1005   [place] 141 08/06/2019 1356   K 3.8 07/05/2021 1005   CL 102 07/05/2021 1005   CO2 28 07/05/2021 1005   GLUCOSE 104 (H) 07/05/2021 1005  BUN 11 07/05/2021 1005   BUN 12 08/06/2019 1356   CREATININE 1.30 (H) 08/20/2021 0931   CALCIUM 9.3 07/05/2021 1005   GFRNONAA 59 (L) 07/04/2021 1117   GFRAA 73 08/06/2019 1356    BNP No results found for: BNP   Imaging:  CT SOFT TISSUE NECK W CONTRAST  Result Date: 08/20/2021 CLINICAL DATA:  Dysphagia, unspecified type, negative endoscopy EXAM: CT NECK WITH CONTRAST TECHNIQUE: Multidetector CT imaging of the neck was performed using the standard protocol following the bolus administration of intravenous contrast. RADIATION DOSE REDUCTION: This exam was performed according to the departmental dose-optimization program which includes automated exposure control, adjustment of the mA and/or kV according to patient size and/or use of iterative reconstruction technique. CONTRAST:  157m OMNIPAQUE IOHEXOL 300 MG/ML  SOLN COMPARISON:  None. FINDINGS: Pharynx and larynx: Prominent soft palate. No mass or swelling. Vocal cords are opposed at time of imaging. Salivary glands: Parotid and submandibular glands are unremarkable. Thyroid: Unremarkable. Lymph nodes: No enlarged or abnormal density nodes. Vascular: Major neck vessels are patent. Calcified plaque at the proximal internal carotids. Limited intracranial: No abnormal enhancement. Visualized orbits: Unremarkable. Mastoids and visualized paranasal sinuses: Mild paranasal sinus mucosal thickening. There may be an odontogenic component, noting periapical lucency about the left posterior maxillary molar. Skeleton: Cervical spine degenerative changes. Upper chest: No apical lung mass. Other: None.  IMPRESSION: No neck mass or adenopathy. Electronically Signed   By: PMacy MisM.D.   On: 08/20/2021 13:56      PFT Results Latest Ref Rng & Units 07/31/2020  FVC-Pre L 2.70  FVC-Predicted Pre % 73  FVC-Post L 2.56  FVC-Predicted Post % 69  Pre FEV1/FVC % % 83  Post FEV1/FCV % % 87  FEV1-Pre L 2.24  FEV1-Predicted Pre % 85  FEV1-Post L 2.24  DLCO uncorrected ml/min/mmHg 16.91  DLCO UNC% % 74  DLCO corrected ml/min/mmHg 16.91  DLCO COR %Predicted % 74  DLVA Predicted % 123  TLC L 4.78  TLC % Predicted % 74  RV % Predicted % 83    Lab Results  Component Value Date   NITRICOXIDE 9 05/28/2016        Assessment & Plan:   COPD with acute exacerbation (HCC) Acute exacerbation with increased, purulent cough. Stable at visit on room air. Eating and drinking well. Currently on daily doxy for skin prescribed by derm. Will tx with augmentin course x 7 days and prednisone taper. Supportive care measures discussed. CXR today.   Patient Instructions  -Continue Breztri 2 puffs twice daily.  Brush tongue and rinse mouth well afterwards. -Continue Albuterol inhaler 2 puffs or 3 mL neb every 6 hours as needed for shortness of breath or wheezing. Notify if symptoms persist despite rescue inhaler/neb use. -Continue Astelin nasal spray 1 spray each nostril twice daily -Continue Zyrtec 10 mg daily -Continue Singulair 10 mg nightly -Continue Flonase 2 sprays each nostril daily as needed for allergies -Continue omeprazole 20 mg daily -Continue Daliresp 500 mcg daily -Continue prednisone 5 mg daily as directed by rheumatology -Continue mucinex 600 mg Twice daily  -Start prednisone taper. 4 tabs for 2 days, then 3 tabs for 2 days, 2 tabs for 2 days, then 1 tab for 2 days, then return to daily 5 mg dose. Take in AM with food -Start aumgentin 875 mg Twice daily for 7 days. Notify immediately of any rash, itching, hives, or swelling, or seek emergency care.Finish your antibiotics in their  entirety. Do not stop just because  symptoms improve. Take with food to reduce GI upset. Take daily probiotic over the counter while taking -Tessalon perles (benzonatate) 200 mg Three times a day as needed for cough  Follow up in one month with [name]. If symptoms do not improve or worsen, please contact office for sooner follow up or seek emergency care.   Allergic rhinitis due to pollen Well-controlled on current regimen. Continue singulair, zyrtec, flonase PRN, and astelin  Lower extremity edema Increased BLE edema per pt. None noted upon exam. Will check BMET and BNP today and evaluate CXR for any evidence of fluid overload.  Cataract Plans for cataract surgery 09/11/2021. Discussed his breathing would need to be stable and over acute flare before clearance from [place]. Preop planned for tomorrow and will obtain more info on anesthesia.    Clayton Bibles, NP 08/21/2021  Pt aware and understands NP's role.

## 2021-08-21 NOTE — Assessment & Plan Note (Signed)
Acute exacerbation with increased, purulent cough. Stable at visit on room air. Eating and drinking well. Currently on daily doxy for skin prescribed by derm. Will tx with augmentin course x 7 days and prednisone taper. Supportive care measures discussed. CXR today.   Patient Instructions  -Continue Breztri 2 puffs twice daily.  Brush tongue and rinse mouth well afterwards. -Continue Albuterol inhaler 2 puffs or 3 mL neb every 6 hours as needed for shortness of breath or wheezing. Notify if symptoms persist despite rescue inhaler/neb use. -Continue Astelin nasal spray 1 spray each nostril twice daily -Continue Zyrtec 10 mg daily -Continue Singulair 10 mg nightly -Continue Flonase 2 sprays each nostril daily as needed for allergies -Continue omeprazole 20 mg daily -Continue Daliresp 500 mcg daily -Continue prednisone 5 mg daily as directed by rheumatology -Continue mucinex 600 mg Twice daily  -Start prednisone taper. 4 tabs for 2 days, then 3 tabs for 2 days, 2 tabs for 2 days, then 1 tab for 2 days, then return to daily 5 mg dose. Take in AM with food -Start aumgentin 875 mg Twice daily for 7 days. Notify immediately of any rash, itching, hives, or swelling, or seek emergency care.Finish your antibiotics in their entirety. Do not stop just because symptoms improve. Take with food to reduce GI upset. Take daily probiotic over the counter while taking -Tessalon perles (benzonatate) 200 mg Three times a day as needed for cough  Follow up in one month with [name]. If symptoms do not improve or worsen, please contact office for sooner follow up or seek emergency care.

## 2021-08-21 NOTE — Progress Notes (Signed)
Mild diffuse interstitial prominence, infection vs edema. Continue prednisone and aumgentin courses as previously prescribed. Will await BNP and BMET results. Pt verbalized understanding.

## 2021-08-21 NOTE — Assessment & Plan Note (Signed)
Increased BLE edema per pt. None noted upon exam. Will check BMET and BNP today and evaluate CXR for any evidence of fluid overload.

## 2021-08-21 NOTE — Assessment & Plan Note (Signed)
Well-controlled on current regimen. Continue singulair, zyrtec, flonase PRN, and astelin

## 2021-08-21 NOTE — Assessment & Plan Note (Signed)
Plans for cataract surgery 09/11/2021. Discussed his breathing would need to be stable and over acute flare before clearance from [place]. Preop planned for tomorrow and will obtain more info on anesthesia.

## 2021-08-22 DIAGNOSIS — H2512 Age-related nuclear cataract, left eye: Secondary | ICD-10-CM | POA: Diagnosis not present

## 2021-08-22 LAB — BASIC METABOLIC PANEL
BUN: 10 mg/dL (ref 6–23)
CO2: 26 mEq/L (ref 19–32)
Calcium: 8.6 mg/dL (ref 8.4–10.5)
Chloride: 105 mEq/L (ref 96–112)
Creatinine, Ser: 1.23 mg/dL (ref 0.40–1.50)
GFR: 56.19 mL/min — ABNORMAL LOW (ref >60.00)
Glucose, Bld: 180 mg/dL — ABNORMAL HIGH (ref 70–99)
Potassium: 3.4 mEq/L — ABNORMAL LOW (ref 3.5–5.1)
Sodium: 139 mEq/L (ref 135–145)

## 2021-08-22 NOTE — Progress Notes (Addendum)
Notified pt BNP and kidney function nl (aside from slight decrease in GFR). Given his BLE edema and CXR findings, advised he take his lasix 40 mg daily for the next 5 days and then return to every other day regimen. His BMET shows his potassium is slightly decreased at 3.4. He is currently on potassium supplementation - increase to 40 mEq daily with lasix increase. Advise he follow up with his PCP who prescribes this for further management and BMET recheck.

## 2021-08-27 DIAGNOSIS — Z7409 Other reduced mobility: Secondary | ICD-10-CM | POA: Diagnosis not present

## 2021-08-27 DIAGNOSIS — Z789 Other specified health status: Secondary | ICD-10-CM | POA: Diagnosis not present

## 2021-08-27 DIAGNOSIS — M5442 Lumbago with sciatica, left side: Secondary | ICD-10-CM | POA: Diagnosis not present

## 2021-08-27 DIAGNOSIS — M48061 Spinal stenosis, lumbar region without neurogenic claudication: Secondary | ICD-10-CM | POA: Diagnosis not present

## 2021-08-28 DIAGNOSIS — M069 Rheumatoid arthritis, unspecified: Secondary | ICD-10-CM | POA: Diagnosis not present

## 2021-09-03 DIAGNOSIS — Z7409 Other reduced mobility: Secondary | ICD-10-CM | POA: Diagnosis not present

## 2021-09-03 DIAGNOSIS — M5442 Lumbago with sciatica, left side: Secondary | ICD-10-CM | POA: Diagnosis not present

## 2021-09-03 DIAGNOSIS — M48061 Spinal stenosis, lumbar region without neurogenic claudication: Secondary | ICD-10-CM | POA: Diagnosis not present

## 2021-09-03 DIAGNOSIS — Z789 Other specified health status: Secondary | ICD-10-CM | POA: Diagnosis not present

## 2021-09-06 ENCOUNTER — Encounter: Payer: Self-pay | Admitting: Ophthalmology

## 2021-09-06 NOTE — Discharge Instructions (Signed)

## 2021-09-07 ENCOUNTER — Encounter: Payer: Self-pay | Admitting: Internal Medicine

## 2021-09-10 DIAGNOSIS — M5442 Lumbago with sciatica, left side: Secondary | ICD-10-CM | POA: Diagnosis not present

## 2021-09-10 DIAGNOSIS — Z789 Other specified health status: Secondary | ICD-10-CM | POA: Diagnosis not present

## 2021-09-10 DIAGNOSIS — M48061 Spinal stenosis, lumbar region without neurogenic claudication: Secondary | ICD-10-CM | POA: Diagnosis not present

## 2021-09-10 DIAGNOSIS — Z7409 Other reduced mobility: Secondary | ICD-10-CM | POA: Diagnosis not present

## 2021-09-10 NOTE — Telephone Encounter (Signed)
I spoke with Thomas Hudson (DPR signed) pt having increased swelling in legs as the day goes on; swelling in lower legs go down overnight with feet up. Thomas Hudson said that during the day if pt is sitting tries to keep feet up but pt is up on his feet a lot during the day and as the day goes on the swelling worsens. Pt last saw Dr Silvio Pate about leg swelling 05/15/21; pt taking furosemide 40 mg one every other day prn for swelling but Thomas Hudson does not think helping leg swelling. Pt does not have redness, warmth or pain in lower legs. Thomas Hudson does not think that pt has tried compression stockings before. No CP or more SOB than usual due to COPD. Pt wants as late in day appt as possible so the provider will be able to see the swelling. Dr Silvio Pate is out of office this week and does not have late day appt any time soon. Thomas Hudson said that pt scheduled for cataract surgery on 09/11/21 and does not want appt before the 9th or 20th of March. Thomas Hudson scheduled appt with Dr Glori Bickers on 09/14/21 at 3pm. UC & ED precautions given and Thomas Hudson voiced understanding.Sending note to DR Glori Bickers and Brice CMA. ?

## 2021-09-11 ENCOUNTER — Encounter: Payer: Self-pay | Admitting: Ophthalmology

## 2021-09-11 ENCOUNTER — Encounter
Admission: RE | Disposition: A | Discharge status: Home / Self Care | Payer: Self-pay | Source: Ambulatory Visit | Attending: Ophthalmology

## 2021-09-11 ENCOUNTER — Ambulatory Visit
Admission: RE | Admit: 2021-09-11 | Discharge: 2021-09-11 | Disposition: A | Discharge status: Home / Self Care | Payer: Medicare Other | Source: Ambulatory Visit | Attending: Ophthalmology | Admitting: Ophthalmology

## 2021-09-11 ENCOUNTER — Ambulatory Visit: Payer: Medicare Other | Admitting: Anesthesiology

## 2021-09-11 ENCOUNTER — Other Ambulatory Visit: Payer: Self-pay

## 2021-09-11 DIAGNOSIS — M069 Rheumatoid arthritis, unspecified: Secondary | ICD-10-CM | POA: Insufficient documentation

## 2021-09-11 DIAGNOSIS — K219 Gastro-esophageal reflux disease without esophagitis: Secondary | ICD-10-CM | POA: Diagnosis not present

## 2021-09-11 DIAGNOSIS — H2512 Age-related nuclear cataract, left eye: Secondary | ICD-10-CM | POA: Insufficient documentation

## 2021-09-11 DIAGNOSIS — I10 Essential (primary) hypertension: Secondary | ICD-10-CM | POA: Diagnosis not present

## 2021-09-11 DIAGNOSIS — H25812 Combined forms of age-related cataract, left eye: Secondary | ICD-10-CM | POA: Diagnosis not present

## 2021-09-11 DIAGNOSIS — J449 Chronic obstructive pulmonary disease, unspecified: Secondary | ICD-10-CM | POA: Diagnosis not present

## 2021-09-11 DIAGNOSIS — Z87891 Personal history of nicotine dependence: Secondary | ICD-10-CM | POA: Insufficient documentation

## 2021-09-11 HISTORY — PX: CATARACT EXTRACTION W/PHACO: SHX586

## 2021-09-11 HISTORY — DX: Other complications of anesthesia, initial encounter: T88.59XA

## 2021-09-11 SURGERY — PHACOEMULSIFICATION, CATARACT, WITH IOL INSERTION
Anesthesia: Monitor Anesthesia Care | Site: Eye | Laterality: Left

## 2021-09-11 MED ORDER — ACETAMINOPHEN 160 MG/5ML PO SOLN: 975.0000 mg | Freq: Once | ORAL | Status: DC | PRN

## 2021-09-11 MED ORDER — SIGHTPATH DOSE#1 BSS IO SOLN
INTRAOCULAR | Status: DC | PRN
  Administered 2021-09-11: 2 mL

## 2021-09-11 MED ORDER — SIGHTPATH DOSE#1 NA CHONDROIT SULF-NA HYALURON 40-17 MG/ML IO SOLN
INTRAOCULAR | Status: DC | PRN
  Administered 2021-09-11: 1 mL via INTRAOCULAR

## 2021-09-11 MED ORDER — ARMC OPHTHALMIC DILATING DROPS
1.0000 "application " | OPHTHALMIC | Status: DC | PRN
  Administered 2021-09-11 (×3): 1 via OPHTHALMIC

## 2021-09-11 MED ORDER — SIGHTPATH DOSE#1 BSS IO SOLN
INTRAOCULAR | Status: DC | PRN
  Administered 2021-09-11: 15 mL via INTRAOCULAR

## 2021-09-11 MED ORDER — ACETAMINOPHEN 500 MG PO TABS: 1000.0000 mg | ORAL_TABLET | Freq: Once | ORAL | Status: DC | PRN

## 2021-09-11 MED ORDER — FENTANYL CITRATE (PF) 100 MCG/2ML IJ SOLN
INTRAMUSCULAR | Status: DC | PRN
  Administered 2021-09-11: 50 ug via INTRAVENOUS

## 2021-09-11 MED ORDER — TETRACAINE HCL 0.5 % OP SOLN
1.0000 [drp] | OPHTHALMIC | Status: DC | PRN
  Administered 2021-09-11 (×3): 1 [drp] via OPHTHALMIC

## 2021-09-11 MED ORDER — LACTATED RINGERS IV SOLN: INTRAVENOUS | Status: DC

## 2021-09-11 MED ORDER — MIDAZOLAM HCL 2 MG/2ML IJ SOLN
INTRAMUSCULAR | Status: DC | PRN
  Administered 2021-09-11 (×2): 1 mg via INTRAVENOUS

## 2021-09-11 MED ORDER — BRIMONIDINE TARTRATE-TIMOLOL 0.2-0.5 % OP SOLN
OPHTHALMIC | Status: DC | PRN
Start: 2021-09-11 — End: 2021-09-11
  Administered 2021-09-11: 1 [drp] via OPHTHALMIC

## 2021-09-11 MED ORDER — ONDANSETRON HCL 4 MG/2ML IJ SOLN: 4.0000 mg | Freq: Once | INTRAMUSCULAR | Status: DC | PRN

## 2021-09-11 MED ORDER — EPINEPHRINE PF 1 MG/ML IJ SOLN
INTRAMUSCULAR | Status: DC | PRN
  Administered 2021-09-11: 64 mL via OPHTHALMIC

## 2021-09-11 MED ORDER — MOXIFLOXACIN HCL 0.5 % OP SOLN
OPHTHALMIC | Status: DC | PRN
  Administered 2021-09-11: 0.2 mL via OPHTHALMIC

## 2021-09-11 SURGICAL SUPPLY — 12 items
CANNULA ANT/CHMB 27G (MISCELLANEOUS) IMPLANT
CANNULA ANT/CHMB 27GA (MISCELLANEOUS) IMPLANT
CATARACT SUITE SIGHTPATH (MISCELLANEOUS) ×3 IMPLANT
FEE CATARACT SUITE SIGHTPATH (MISCELLANEOUS) ×2 IMPLANT
GLOVE SURG ENC TEXT LTX SZ8 (GLOVE) ×4 IMPLANT
GLOVE SURG TRIUMPH 8.0 PF LTX (GLOVE) ×4 IMPLANT
LENS IOL TECNIS EYHANCE 20.0 (Intraocular Lens) ×2 IMPLANT
NDL FILTER BLUNT 18X1 1/2 (NEEDLE) ×2 IMPLANT
NEEDLE FILTER BLUNT 18X 1/2SAF (NEEDLE) ×2
NEEDLE FILTER BLUNT 18X1 1/2 (NEEDLE) ×1 IMPLANT
SYR 3ML LL SCALE MARK (SYRINGE) ×4 IMPLANT
WATER STERILE IRR 250ML POUR (IV SOLUTION) ×4 IMPLANT

## 2021-09-11 NOTE — H&P (Signed)
[facility]   Primary Care Physician:  [name], MD Ophthalmologist: Dr. George Ina  Pre-Procedure History & Physical: HPI:  Thomas Hudson is a 81 y.o. male here for cataract surgery.   Past Medical History:  Diagnosis Date   BPH (benign prostatic hyperplasia)    Carpal tunnel syndrome on both sides    Complication of anesthesia    Low BP and O2 sats after EGD   COPD (chronic obstructive pulmonary disease) (HCC)    GERD (gastroesophageal reflux disease)    Hyperlipidemia    Hypertension    RAD (reactive airway disease)    no PMH of asthma; RAD post infection   Rheumatoid arthritis [facility])         Past Surgical History:  Procedure Laterality Date   BIOPSY  01/06/2018   Procedure: BIOPSY;  Surgeon: Irene Shipper, MD;  Location: [facility] ENDOSCOPY;  Service: Endoscopy;;   BIOPSY  01/03/2020   Procedure: BIOPSY;  Surgeon: Irene Shipper, MD;  Location: [facility] ENDOSCOPY;  Service: Endoscopy;;   COLONOSCOPY W/ POLYPECTOMY  2009   X 2; Dr.Perry    ERCP N/A 11/05/2016   Procedure: ENDOSCOPIC RETROGRADE CHOLANGIOPANCREATOGRAPHY (ERCP);  Surgeon: Irene Shipper, MD;  Location: [facility] ENDOSCOPY;  Service: Endoscopy;  Laterality: N/A;   ESOPHAGOGASTRODUODENOSCOPY (EGD) WITH PROPOFOL N/A 07/29/2016   Procedure: ESOPHAGOGASTRODUODENOSCOPY (EGD) WITH PROPOFOL;  Surgeon: Irene Shipper, MD;  Location: [facility] ENDOSCOPY;  Service: Endoscopy;  Laterality: N/A;  will need ERCP scope   ESOPHAGOGASTRODUODENOSCOPY (EGD) WITH PROPOFOL N/A 01/06/2018   Procedure: ESOPHAGOGASTRODUODENOSCOPY (EGD) WITH PROPOFOL;  Surgeon: Irene Shipper, MD;  Location: [facility] ENDOSCOPY;  Service: Endoscopy;  Laterality: N/A;  NEED ERCP SCOPE   ESOPHAGOGASTRODUODENOSCOPY (EGD) WITH PROPOFOL N/A 01/03/2020   Procedure: ESOPHAGOGASTRODUODENOSCOPY (EGD) WITH PROPOFOL;  Surgeon: Irene Shipper, MD;  Location: [facility] ENDOSCOPY;  Service: Endoscopy;  Laterality: N/A;  Needs ERCP scope   HERNIA REPAIR     LUMBAR LAMINECTOMY  07/2008   UPPER  GASTROINTESTINAL ENDOSCOPY      gastric polyp;Dr.Perry    VASECTOMY     WHIPPLE PROCEDURE  2012   partial ; [facility]    Prior to Admission medications   Medication Sig Start Date End Date Taking? Authorizing Provider  albuterol (PROVENTIL) (2.5 MG/3ML) 0.083% nebulizer solution Take 3 mLs (2.5 mg total) by nebulization every 6 (six) hours as needed for wheezing or shortness of breath. 06/19/21 10/17/21 Yes Spero Geralds, MD  albuterol (VENTOLIN HFA) 108 (90 Base) MCG/ACT inhaler Inhale 2 puffs into the lungs every 6 (six) hours as needed for wheezing or shortness of breath. 07/31/20  Yes Spero Geralds, MD  alfuzosin (UROXATRAL) 10 MG 24 hr tablet TAKE 1 TABLET(10 MG) BY MOUTH DAILY 07/05/21  Yes Viviana Simpler I, MD  amLODipine (NORVASC) 5 MG tablet Take 1 tablet (5 mg total) by mouth daily. 07/05/21  Yes [name], MD  azelastine (ASTELIN) 0.1 % nasal spray Place 1 spray into both nostrils 2 (two) times daily. Use in each nostril as directed 06/19/21  Yes Spero Geralds, MD  benzonatate (TESSALON) 200 MG capsule Take 1 capsule (200 mg total) by mouth 3 (three) times daily as needed for cough. 08/21/21  Yes Cobb, Karie Schwalbe, NP  Budeson-Glycopyrrol-Formoterol (BREZTRI AEROSPHERE) 160-9-4.8 MCG/ACT AERO Inhale 2 puffs into the lungs in the morning and at bedtime. 06/19/21 09/17/21 Yes Spero Geralds, MD  Certolizumab Pegol (CIMZIA) 2 X 200 MG KIT Inject 2 each into the skin every 30 (  thirty) days.   Yes [provider]  cetirizine (ZYRTEC) 10 MG tablet Take 1 tablet (10 mg total) by mouth at bedtime. 07/05/21  Yes [name], MD  doxycycline (MONODOX) 50 MG capsule Take 50 mg by mouth daily. 08/14/20  Yes [provider]  erythromycin ophthalmic ointment Place 1 application into both eyes daily as needed (dry eyes).   Yes [provider]  ferrous sulfate 325 (65 FE) MG tablet Take 1 tablet (325 mg total) by mouth 3 (three) times daily with meals. 07/05/21   Yes [name], MD  finasteride (PROSCAR) 5 MG tablet Take 1 tablet (5 mg total) by mouth daily. 07/05/21  Yes [name], MD  fluticasone (FLONASE) 50 MCG/ACT nasal spray Place 2 sprays into both nostrils daily as needed for allergies. 07/05/21  Yes [name], MD  folic acid (FOLVITE) 1 MG tablet Take 1 mg by mouth daily.  04/10/18  Yes [provider]  furosemide (LASIX) 40 MG tablet Take 1 tablet (40 mg total) by mouth daily as needed. For increased leg swelling Patient taking differently: Take 40 mg by mouth every other day. For increased leg swelling 07/05/21  Yes Viviana Simpler I, MD  gabapentin (NEURONTIN) 300 MG capsule Take 300-600 mg by mouth See admin instructions. Take 1 capsule (300 mg) by mouth in the morning & take 2 capsules (600 mg) by mouth at night. 10/14/19  Yes [provider]  losartan (COZAAR) 100 MG tablet Take 1 tablet (100 mg total) by mouth daily. 07/05/21  Yes [name], MD  montelukast (SINGULAIR) 10 MG tablet Take 1 tablet (10 mg total) by mouth at bedtime. 07/05/21  Yes [name], MD  Multiple Vitamin (MULTIVITAMIN WITH MINERALS) TABS tablet Take 1 tablet by mouth daily.   Yes [provider]  omeprazole (PRILOSEC) 20 MG capsule Take 1 capsule (20 mg total) by mouth daily. 07/05/21  Yes Viviana Simpler I, MD  potassium chloride SA (KLOR-CON M) 20 MEQ tablet Take 1 tablet (20 mEq total) by mouth daily. 07/05/21  Yes [name], MD  predniSONE (DELTASONE) 10 MG tablet 4 tabs for 2 days, then 3 tabs for 2 days, 2 tabs for 2 days, then 1 tab for 2 days, then return to your normal daily 5 mg dose 08/21/21  Yes Cobb, Karie Schwalbe, NP  predniSONE (DELTASONE) 5 MG tablet Take 5 mg by mouth See admin instructions. Take 1 tablet daily 09/02/19  Yes [provider]  roflumilast (DALIRESP) 500 MCG TABS tablet Take 1 tablet (500 mcg total) by mouth daily. 08/13/21  Yes Spero Geralds, MD  rosuvastatin  (CRESTOR) 10 MG tablet Take 10 mg by mouth daily.   Yes [provider]  aspirin-acetaminophen-caffeine (EXCEDRIN MIGRAINE) [phone] MG tablet Take 2 tablets by mouth 3 (three) times daily as needed for headache. Patient not taking: Reported on 09/06/2021    [provider]    Allergies as of 08/17/2021 - Review Complete 08/02/2021  Allergen Reaction Noted   Morphine and related Other (See Comments) 07/24/2012    Family History  Problem Relation Age of Onset   Skin cancer Mother    Throat cancer Father        smoker   Esophageal cancer Father    Uterine cancer Sister    COPD Sister        X2   Ovarian cancer Sister    Throat cancer Sister  smoker   Breast cancer Sister    Lung cancer Sister        smoker   Leukemia Sister    Coronary artery disease Brother        in 52s   Lung cancer Brother        kidney cancer   Throat cancer Brother    Heart disease Brother    Heart disease Brother    Heart disease Maternal Grandfather    Stroke Paternal Grandmother 36   Colon cancer Paternal Grandfather    Heart disease Paternal Grandfather    Prostate cancer Paternal Uncle    Heart attack Paternal Uncle        3 uncles, >55   Asthma Neg Hx     Social History   Socioeconomic History   Marital status: Married    Spouse name: Not on file   Number of children: 2   Years of education: Not on file   Highest education level: Not on file  Occupational History   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[employer]    Comment: Retired  Tobacco Use   Smoking status: Former    Packs/day: 2.00    Years: 50.00    Pack years: 100.00    Types: Cigarettes    Quit date: 07/08/1997    Years since quitting: 24.1   Smokeless tobacco: Never  Vaping Use   Vaping Use: Never used  Substance and Sexual Activity   Alcohol use: No    Alcohol/week: 0.0 standard drinks   Drug use: No   Sexual activity: Yes  Other Topics Concern   Not on file  Social History Narrative    Has living will   Requests daughters as health care POA   Would accept resuscitation attempts   Not sure about tube feeds   Social Determinants of Health   Financial Resource Strain: Not on file  Food Insecurity: Not on file  Transportation Needs: Not on file  Physical Activity: Not on file  Stress: Not on file  Social Connections: Not on file  Intimate Partner Violence: Not on file    Review of Systems: See HPI, otherwise negative ROS  Physical Exam: BP (!) 149/73    Pulse 97    Temp 98.1 F (36.7 C) (Temporal)    Resp 20    Ht '5\' 7"'  (1.702 m)    Wt 87.5 kg    SpO2 97%    BMI 30.23 kg/m  General:   Alert, cooperative in NAD Head:  Normocephalic and atraumatic. Respiratory:  Normal work of breathing. Cardiovascular:  RRR  Impression/Plan: Thomas Hudson is here for cataract surgery.  Risks, benefits, limitations, and alternatives regarding cataract surgery have been reviewed with the patient.  Questions have been answered.  All parties agreeable.   Birder Robson, MD  09/11/2021, 8:32 AM

## 2021-09-11 NOTE — Anesthesia Postprocedure Evaluation (Signed)
Anesthesia Post Note ? ?Patient: Thomas Hudson ? ?Procedure(s) Performed: CATARACT EXTRACTION PHACO AND INTRAOCULAR LENS PLACEMENT (IOC) LEFT 6.38 00:41.6 (Left: Eye) ? ? ?  ?Patient location during evaluation: PACU ?Anesthesia Type: MAC ?Level of consciousness: awake and alert ?Pain management: pain level controlled ?Vital Signs Assessment: post-procedure vital signs reviewed and stable ?Respiratory status: spontaneous breathing, nonlabored ventilation and respiratory function stable ?Cardiovascular status: stable and blood pressure returned to baseline ?Postop Assessment: no apparent nausea or vomiting ?Anesthetic complications: no ? ? ?No notable events documented. ? ?April Manson ? ? ? ? ? ?

## 2021-09-11 NOTE — Transfer of Care (Signed)
Immediate Anesthesia Transfer of Care Note ? ?Patient: Thomas Hudson ? ?Procedure(s) Performed: CATARACT EXTRACTION PHACO AND INTRAOCULAR LENS PLACEMENT (IOC) LEFT 6.38 00:41.6 (Left: Eye) ? ?Patient Location: PACU ? ?Anesthesia Type: MAC ? ?Level of Consciousness: awake, alert  and patient cooperative ? ?Airway and Oxygen Therapy: Patient Spontanous Breathing and Patient connected to supplemental oxygen ? ?Post-op Assessment: Post-op Vital signs reviewed, Patient's Cardiovascular Status Stable, Respiratory Function Stable, Patent Airway and No signs of Nausea or vomiting ? ?Post-op Vital Signs: Reviewed and stable ? ?Complications: No notable events documented. ? ?

## 2021-09-11 NOTE — Anesthesia Procedure Notes (Signed)
Procedure Name: [place] ?Date/Time: 09/11/2021 8:40 AM ?Performed by: Jeannene Patella, CRNA ?Pre-anesthesia Checklist: Patient identified, Emergency Drugs available, Suction available, Timeout performed and Patient being monitored ?Patient Re-evaluated:Patient Re-evaluated prior to induction ?Oxygen Delivery Method: Nasal cannula ?Placement Confirmation: positive ETCO2 ? ? ? ? ?

## 2021-09-11 NOTE — Anesthesia Preprocedure Evaluation (Addendum)
Anesthesia Evaluation  ?Patient identified by MRN, date of birth, ID band ?Patient awake ? ? ? ?Reviewed: ?Allergy & Precautions, H&P , NPO status , Patient's Chart, lab work & pertinent test results, reviewed documented beta blocker date and time  ? ?History of Anesthesia Complications ?(+) history of anesthetic complications (low BP and O2 sats after EGD 08/2021) ? ?Airway ?Mallampati: II ? ?TM Distance: >3 FB ?Neck ROM: full ? ? ? Dental ?no notable dental hx. ? ?  ?Pulmonary ?neg pulmonary ROS, COPD, former smoker,  ?  ?Pulmonary exam normal ?breath sounds clear to auscultation ? ? ? ? ? ? Cardiovascular ?Exercise Tolerance: Good ?hypertension, negative cardio ROS ? ? ?Rhythm:regular Rate:Normal ? ? ?  ?Neuro/Psych ?negative neurological ROS ? negative psych ROS  ? GI/Hepatic ?negative GI ROS, Neg liver ROS, GERD  ,  ?Endo/Other  ?negative endocrine ROS ? Renal/GU ?negative Renal ROS  ?negative genitourinary ?  ?Musculoskeletal ? ?(+) Arthritis , Rheumatoid disorders,   ? Abdominal ?  ?Peds ? Hematology ?negative hematology ROS ?(+)   ?Anesthesia Other Findings ? ? Reproductive/Obstetrics ?negative OB ROS ? ?  ? ? ? ? ? ? ? ? ? ? ? ? ? ?  ?  ? ? ? ? ? ? ? ?Anesthesia Physical ?Anesthesia Plan ? ?ASA: 3 ? ?Anesthesia Plan: MAC  ? ?Post-op Pain Management:   ? ?Induction:  ? ?PONV Risk Score and Plan: 1 and TIVA, Midazolam and Treatment may vary due to age or medical condition ? ?Airway Management Planned:  ? ?Additional Equipment:  ? ?Intra-op Plan:  ? ?Post-operative Plan:  ? ?Informed Consent: I have reviewed the patients History and Physical, chart, labs and discussed the procedure including the risks, benefits and alternatives for the proposed anesthesia with the patient or authorized representative who has indicated his/her understanding and acceptance.  ? ? ? ?Dental Advisory Given ? ?Plan Discussed with: CRNA ? ?Anesthesia Plan Comments:   ? ? ? ? ? ? ?Anesthesia Quick  Evaluation ? ?

## 2021-09-11 NOTE — Op Note (Signed)
PREOPERATIVE DIAGNOSIS:  Nuclear sclerotic cataract of the left eye. ?  ?POSTOPERATIVE DIAGNOSIS:  Nuclear sclerotic cataract of the left eye. ?  ?OPERATIVE PROCEDURE:ORPROCALL@ ?  ?SURGEON:  Birder Robson, MD. ?  ?ANESTHESIA: ? ?Anesthesiologist: April Manson, MD ?CRNA: Jeannene Patella, CRNA ? ?1.      Managed anesthesia care. ?2.     0.48m of Shugarcaine was instilled following the paracentesis ?  ?COMPLICATIONS:  None. ?  ?TECHNIQUE:   Stop and chop ?  ?DESCRIPTION OF PROCEDURE:  The patient was examined and consented in the preoperative holding area where the aforementioned topical anesthesia was applied to the left eye and then brought back to the Operating Room where the left eye was prepped and draped in the usual sterile ophthalmic fashion and a lid speculum was placed. A paracentesis was created with the side port blade and the anterior chamber was filled with viscoelastic. A near clear corneal incision was performed with the steel keratome. A continuous curvilinear capsulorrhexis was performed with a cystotome followed by the capsulorrhexis forceps. Hydrodissection and hydrodelineation were carried out with BSS on a blunt cannula. The lens was removed in a stop and chop  technique and the remaining cortical material was removed with the irrigation-aspiration handpiece. The capsular bag was inflated with viscoelastic and the Technis [id number] lens was placed in the capsular bag without complication. The remaining viscoelastic was removed from the eye with the irrigation-aspiration handpiece. The wounds were hydrated. The anterior chamber was flushed with BSS and the eye was inflated to physiologic pressure. 0.121mVigamox was placed in the anterior chamber. The wounds were found to be water tight. The eye was dressed with Combigan. The patient was given protective glasses to wear throughout the day and a shield with which to sleep tonight. The patient was also given drops with which to begin a drop  regimen today and will follow-up with me in one day. ?Implant Name Type Inv. Item Serial No. Manufacturer Lot No. LRB No. Used Action  ?LENS IOL TECNIS EYHANCE 20.0 - [id number] Lens LENS IOL TECNIS EYHANCE 20.0 [id number]  Left 1 Implanted  ?  ?Procedure(s): ?CATARACT EXTRACTION PHACO AND INTRAOCULAR LENS PLACEMENT (IOC) LEFT 6.38 00:41.6 (Left) ? ?Electronically signed: WiBirder Robson/01/2022 8:55 AM ? ?

## 2021-09-12 ENCOUNTER — Encounter: Payer: Self-pay | Admitting: Ophthalmology

## 2021-09-13 ENCOUNTER — Encounter: Payer: Self-pay | Admitting: Ophthalmology

## 2021-09-14 ENCOUNTER — Encounter: Payer: Self-pay | Admitting: Family Medicine

## 2021-09-14 ENCOUNTER — Other Ambulatory Visit: Payer: Self-pay

## 2021-09-14 ENCOUNTER — Ambulatory Visit (INDEPENDENT_AMBULATORY_CARE_PROVIDER_SITE_OTHER): Payer: Medicare Other | Admitting: Family Medicine

## 2021-09-14 ENCOUNTER — Encounter: Payer: Self-pay | Admitting: Internal Medicine

## 2021-09-14 ENCOUNTER — Ambulatory Visit (INDEPENDENT_AMBULATORY_CARE_PROVIDER_SITE_OTHER): Payer: Medicare Other | Admitting: Internal Medicine

## 2021-09-14 VITALS — BP 124/66 | HR 86 | Temp 97.9°F | Ht 67.0 in | Wt 196.0 lb

## 2021-09-14 VITALS — BP 124/70 | HR 91 | Temp 98.1°F | Ht 67.0 in | Wt 197.4 lb

## 2021-09-14 DIAGNOSIS — M79671 Pain in right foot: Secondary | ICD-10-CM | POA: Diagnosis not present

## 2021-09-14 DIAGNOSIS — J329 Chronic sinusitis, unspecified: Secondary | ICD-10-CM

## 2021-09-14 DIAGNOSIS — I872 Venous insufficiency (chronic) (peripheral): Secondary | ICD-10-CM | POA: Diagnosis not present

## 2021-09-14 DIAGNOSIS — R600 Localized edema: Secondary | ICD-10-CM | POA: Diagnosis not present

## 2021-09-14 DIAGNOSIS — J449 Chronic obstructive pulmonary disease, unspecified: Secondary | ICD-10-CM

## 2021-09-14 DIAGNOSIS — M79672 Pain in left foot: Secondary | ICD-10-CM

## 2021-09-14 NOTE — Progress Notes (Signed)
Subjective:    Patient ID: Thomas Hudson, male    DOB: 10/10/1940, 81 y.o.   MRN: [id number]  This visit occurred during the SARS-CoV-2 public health emergency.  Safety protocols were in place, including screening questions prior to the visit, additional usage of staff PPE, and extensive cleaning of exam room while observing appropriate contact time as indicated for disinfecting solutions.   HPI 81 yo pt of Dr Silvio Pate presents with lower leg swelling   Wt Readings from Last 3 Encounters:  09/14/21 196 lb (88.9 kg)  09/14/21 197 lb 6.4 oz (89.5 kg)  09/11/21 193 lb (87.5 kg)   30.70 kg/m  H/o HTN on losartan and amlodipine 5  Also venous insuff-uses lasix prn  RA and spinal stenosis  5 mg prednisone   Going on for at least 2 years  Lasix has not really helped much   Did bid for a week just to see it if would help  Helped a little  Now back to every other day  Helps to elevate He wears supp hose to the knee  2-3 times per week , then helps a bit   Sees cardiology-not in heart failure at all   Cut back on salt  Eats some processed foods   BP Readings from Last 3 Encounters:  09/14/21 124/66  09/14/21 124/70  09/11/21 134/90   Pulse Readings from Last 3 Encounters:  09/14/21 86  09/14/21 91  09/11/21 79     They throb at night -legs and feet    Lab Results  Component Value Date   CREATININE 1.23 08/21/2021   BUN 10 08/21/2021   NA 139 08/21/2021   K 3.4 (L) 08/21/2021   CL 105 08/21/2021   CO2 26 08/21/2021  GFR 56.19 BNP in February was nl at 1  Had steroids in feb for resp issue    DG Chest 2 View  Result Date: 08/21/2021 CLINICAL DATA:  Productive cough and shortness of breath. EXAM: CHEST - 2 VIEW COMPARISON:  02/08/21 FINDINGS: The heart size and mediastinal contours are within normal limits. Mild diffuse interstitial prominence is noted bilaterally. No superimposed airspace consolidation. No pleural effusion. The visualized skeletal structures are  unremarkable. IMPRESSION: Mild diffuse interstitial prominence suggestive of atypical infection or edema. Electronically Signed   By: Kerby Moors M.D.   On: 08/21/2021 11:28   CT SOFT TISSUE NECK W CONTRAST  Result Date: 08/20/2021 CLINICAL DATA:  Dysphagia, unspecified type, negative endoscopy EXAM: CT NECK WITH CONTRAST TECHNIQUE: Multidetector CT imaging of the neck was performed using the standard protocol following the bolus administration of intravenous contrast. RADIATION DOSE REDUCTION: This exam was performed according to the departmental dose-optimization program which includes automated exposure control, adjustment of the mA and/or kV according to patient size and/or use of iterative reconstruction technique. CONTRAST:  113m OMNIPAQUE IOHEXOL 300 MG/ML  SOLN COMPARISON:  None. FINDINGS: Pharynx and larynx: Prominent soft palate. No mass or swelling. Vocal cords are opposed at time of imaging. Salivary glands: Parotid and submandibular glands are unremarkable. Thyroid: Unremarkable. Lymph nodes: No enlarged or abnormal density nodes. Vascular: Major neck vessels are patent. Calcified plaque at the proximal internal carotids. Limited intracranial: No abnormal enhancement. Visualized orbits: Unremarkable. Mastoids and visualized paranasal sinuses: Mild paranasal sinus mucosal thickening. There may be an odontogenic component, noting periapical lucency about the left posterior maxillary molar. Skeleton: Cervical spine degenerative changes. Upper chest: No apical lung mass. Other: None. IMPRESSION: No neck mass or adenopathy. Electronically Signed  By: Macy Mis M.D.   On: 08/20/2021 13:56     Patient Active Problem List   Diagnosis Date Noted   Foot pain, bilateral 09/14/2021   Cataract 08/21/2021   Elevated serum creatinine 51/08/5850   Umbilical hernia without obstruction and without gangrene 07/05/2021   Lower abdominal pain 07/05/2021   Venous insufficiency 11/24/2020   Lower  extremity edema 11/21/2020   DOE (dyspnea on exertion) 05/12/2019   Ampullary adenoma    Duodenal adenoma    Upper airway cough syndrome 05/14/2016   COPD with acute exacerbation ([place]) 04/19/2016   Preventative health care 04/18/2015   Advanced directives, counseling/discussion 04/07/2014   Seronegative rheumatoid arthritis of hand ([place]) 09/02/2012   REACTIVE AIRWAY DISEASE 06/23/2009   ANEMIA-IRON DEFICIENCY 06/19/2009   B12 deficiency anemia 05/16/2009   Allergic rhinitis due to pollen 03/17/2008   BPH (benign prostatic hyperplasia) 03/17/2008   Essential hypertension, benign 07/07/2007   Esophageal reflux 07/07/2007   ESOPHAGEAL STRICTURE 03/25/2007   Past Medical History:  Diagnosis Date   BPH (benign prostatic hyperplasia)    Carpal tunnel syndrome on both sides    Complication of anesthesia    Low BP and O2 sats after EGD   COPD (chronic obstructive pulmonary disease) ([facility])    GERD (gastroesophageal reflux disease)    Hyperlipidemia    Hypertension    RAD (reactive airway disease)    no PMH of asthma; RAD post infection   Rheumatoid arthritis ([place])        Past Surgical History:  Procedure Laterality Date   BIOPSY  01/06/2018   Procedure: BIOPSY;  Surgeon: Irene Shipper, MD;  Location: [facility];  Service: Endoscopy;;   BIOPSY  01/03/2020   Procedure: BIOPSY;  Surgeon: Irene Shipper, MD;  Location: [facility];  Service: Endoscopy;;   CATARACT EXTRACTION W/PHACO Left 09/11/2021   Procedure: CATARACT EXTRACTION PHACO AND INTRAOCULAR LENS PLACEMENT ([place]) LEFT 6.38 00:41.6;  Surgeon: Birder Robson, MD;  Location: [place];  Service: Ophthalmology;  Laterality: Left;   COLONOSCOPY W/ POLYPECTOMY  2009   X 2; Dr.Perry    ERCP N/A 11/05/2016   Procedure: ENDOSCOPIC RETROGRADE CHOLANGIOPANCREATOGRAPHY (ERCP);  Surgeon: Irene Shipper, MD;  Location[facility] ENDOSCOPY;  Service: Endoscopy;  Laterality: N/A;   ESOPHAGOGASTRODUODENOSCOPY (EGD) WITH PROPOFOL N/A 07/29/2016    Procedure: ESOPHAGOGASTRODUODENOSCOPY (EGD) WITH PROPOFOL;  Surgeon: Irene Shipper, MD;  [facility];  Service: Endoscopy;  Laterality: N/A;  will need ERCP scope   ESOPHAGOGASTRODUODENOSCOPY (EGD) WITH PROPOFOL N/A 01/06/2018   Procedure: ESOPHAGOGASTRODUODENOSCOPY (EGD) WITH PROPOFOL;  Surgeon: Irene Shipper, MD;  [facility];  Service: Endoscopy;  Laterality: N/A;  NEED ERCP SCOPE   ESOPHAGOGASTRODUODENOSCOPY (EGD) WITH PROPOFOL N/A 01/03/2020   Procedure: ESOPHAGOGASTRODUODENOSCOPY (EGD) WITH PROPOFOL;  Surgeon: Irene Shipper, MD;  [facility]: [facility];  Service: Endoscopy;  Laterality: N/A;  Needs ERCP scope   HERNIA REPAIR     LUMBAR LAMINECTOMY  07/2008   UPPER GASTROINTESTINAL ENDOSCOPY      gastric polyp;Dr.Perry    VASECTOMY     WHIPPLE PROCEDURE  2012   partial ; [facility]   Social History   Tobacco Use   Smoking status: Former    Packs/day: 2.00    Years: 50.00    Pack years: 100.00    Types: Cigarettes    Quit date: 07/08/1997    Years since quitting: 24.2   Smokeless tobacco: Never  Vaping Use   Vaping Use: Never used  Substance Use  Topics   Alcohol use: No    Alcohol/week: 0.0 standard drinks   Drug use: No   Family History  Problem Relation Age of Onset   Skin cancer Mother    Throat cancer Father        smoker   Esophageal cancer Father    Uterine cancer Sister    COPD Sister        X2   Ovarian cancer Sister    Throat cancer Sister        smoker   Breast cancer Sister    Lung cancer Sister        smoker   Leukemia Sister    Coronary artery disease Brother        in 63s   Lung cancer Brother        kidney cancer   Throat cancer Brother    Heart disease Brother    Heart disease Brother    Heart disease Maternal Grandfather    Stroke Paternal Grandmother 58   Colon cancer Paternal Grandfather    Heart disease Paternal Grandfather    Prostate cancer Paternal Uncle    Heart attack Paternal Uncle        3 uncles, >55   Asthma  Neg Hx    Allergies  Allergen Reactions   Morphine And Related Other (See Comments)    Itching & rash Because of a history of documented adverse serious drug reaction;Medi Alert bracelet  is recommended   Current Outpatient Medications on File Prior to Visit  Medication Sig Dispense Refill   albuterol (PROVENTIL) (2.5 MG/3ML) 0.083% nebulizer solution Take 3 mLs (2.5 mg total) by nebulization every 6 (six) hours as needed for wheezing or shortness of breath. 360 mL 3   albuterol (VENTOLIN HFA) 108 (90 Base) MCG/ACT inhaler Inhale 2 puffs into the lungs every 6 (six) hours as needed for wheezing or shortness of breath. 3 each 3   alfuzosin (UROXATRAL) 10 MG 24 hr tablet TAKE 1 TABLET(10 MG) BY MOUTH DAILY 90 tablet 3   amLODipine (NORVASC) 5 MG tablet Take 1 tablet (5 mg total) by mouth daily. 90 tablet 3   aspirin-acetaminophen-caffeine (EXCEDRIN MIGRAINE) 250-250-65 MG tablet Take 2 tablets by mouth 3 (three) times daily as needed for headache.     azelastine (ASTELIN) 0.1 % nasal spray Place 1 spray into both nostrils 2 (two) times daily. Use in each nostril as directed 90 mL 3   benzonatate (TESSALON) 200 MG capsule Take 1 capsule (200 mg total) by mouth 3 (three) times daily as needed for cough. 30 capsule 0   betamethasone, augmented, (DIPROLENE) 0.05 % lotion Apply 1 application. topically daily.     Budeson-Glycopyrrol-Formoterol (BREZTRI AEROSPHERE) 160-9-4.8 MCG/ACT AERO Inhale 2 puffs into the lungs in the morning and at bedtime. 30 g 3   Certolizumab Pegol (CIMZIA) 2 X 200 MG KIT Inject 2 each into the skin every 30 (thirty) days.     cetirizine (ZYRTEC) 10 MG tablet Take 1 tablet (10 mg total) by mouth at bedtime. 90 tablet 3   doxycycline (MONODOX) 50 MG capsule Take 50 mg by mouth daily.     erythromycin ophthalmic ointment Place 1 application into both eyes daily as needed (dry eyes).     ferrous sulfate 325 (65 FE) MG tablet Take 1 tablet (325 mg total) by mouth 3 (three) times  daily with meals. 270 tablet 3   finasteride (PROSCAR) 5 MG tablet Take 1 tablet (5 mg total) by mouth  daily. 90 tablet 3   fluticasone (FLONASE) 50 MCG/ACT nasal spray Place 2 sprays into both nostrils daily as needed for allergies. 48 g 3   folic acid (FOLVITE) 1 MG tablet Take 1 mg by mouth daily.   3   furosemide (LASIX) 40 MG tablet Take 1 tablet (40 mg total) by mouth daily as needed. For increased leg swelling (Patient taking differently: Take 40 mg by mouth every other day. For increased leg swelling) 90 tablet 3   gabapentin (NEURONTIN) 300 MG capsule Take 300-600 mg by mouth See admin instructions. Take 1 capsule (300 mg) by mouth in the morning & take 2 capsules (600 mg) by mouth at night.     losartan (COZAAR) 100 MG tablet Take 1 tablet (100 mg total) by mouth daily. 90 tablet 3   montelukast (SINGULAIR) 10 MG tablet Take 1 tablet (10 mg total) by mouth at bedtime. 90 tablet 3   Multiple Vitamin (MULTIVITAMIN WITH MINERALS) TABS tablet Take 1 tablet by mouth daily.     omeprazole (PRILOSEC) 20 MG capsule Take 1 capsule (20 mg total) by mouth daily. 90 capsule 3   potassium chloride SA (KLOR-CON M) 20 MEQ tablet Take 1 tablet (20 mEq total) by mouth daily. 90 tablet 3   predniSONE (DELTASONE) 10 MG tablet 4 tabs for 2 days, then 3 tabs for 2 days, 2 tabs for 2 days, then 1 tab for 2 days, then return to your normal daily 5 mg dose 20 tablet 0   predniSONE (DELTASONE) 5 MG tablet Take 5 mg by mouth See admin instructions. Take 1 tablet daily     roflumilast (DALIRESP) 500 MCG TABS tablet Take 1 tablet (500 mcg total) by mouth daily. 90 tablet 3   rosuvastatin (CRESTOR) 10 MG tablet Take 10 mg by mouth daily.     No current facility-administered medications on file prior to visit.     Review of Systems  Constitutional:  Negative for activity change, appetite change, fatigue, fever and unexpected weight change.  HENT:  Negative for congestion, rhinorrhea, sore throat and trouble  swallowing.   Eyes:  Negative for pain, redness, itching and visual disturbance.  Respiratory:  Negative for cough, chest tightness, shortness of breath and wheezing.        Breathing is baseline  Cardiovascular:  Positive for leg swelling. Negative for chest pain and palpitations.  Gastrointestinal:  Negative for abdominal pain, blood in stool, constipation, diarrhea and nausea.  Endocrine: Negative for cold intolerance, heat intolerance, polydipsia and polyuria.  Genitourinary:  Negative for difficulty urinating, dysuria, frequency and urgency.  Musculoskeletal:  Negative for arthralgias, joint swelling and myalgias.  Skin:  Negative for pallor and rash.  Neurological:  Negative for dizziness, tremors, weakness, numbness and headaches.  Hematological:  Negative for adenopathy. Does not bruise/bleed easily.  Psychiatric/Behavioral:  Negative for decreased concentration and dysphoric mood. The patient is not nervous/anxious.       Objective:   Physical Exam Constitutional:      General: He is not in acute distress.    Appearance: Normal appearance. He is well-developed. He is obese. He is not ill-appearing or diaphoretic.  HENT:     Head: Normocephalic and atraumatic.     Mouth/Throat:     Mouth: Mucous membranes are moist.     Pharynx: Oropharynx is clear.  Eyes:     Conjunctiva/sclera: Conjunctivae normal.     Pupils: Pupils are equal, round, and reactive to light.  Neck:  Thyroid: No thyromegaly.     Vascular: No carotid bruit or JVD.  Cardiovascular:     Rate and Rhythm: Normal rate and regular rhythm.     Heart sounds: Normal heart sounds.    No gallop.     Comments: No large varicosities noted in LEs Pulmonary:     Effort: Pulmonary effort is normal. No respiratory distress.     Breath sounds: Normal breath sounds. No stridor. No wheezing, rhonchi or rales.     Comments: Bs are distant Abdominal:     General: Bowel sounds are normal. There is no distension or  abdominal bruit.     Palpations: Abdomen is soft. There is no mass.     Tenderness: There is no abdominal tenderness.  Musculoskeletal:     Cervical back: Normal range of motion and neck supple.     Right lower leg: No edema.     Left lower leg: No edema.     Comments: One plus pitting edema to upper ankle bilaterally     Lymphadenopathy:     Cervical: No cervical adenopathy.  Skin:    General: Skin is warm and dry.     Coloration: Skin is not pale.     Findings: No rash.     Comments: Toe nails are thickened  No tenderness in feet  Neurological:     Mental Status: He is alert.     Coordination: Coordination normal.     Deep Tendon Reflexes: Reflexes are normal and symmetric. Reflexes normal.  Psychiatric:        Mood and Affect: Mood normal.          Assessment & Plan:   Problem List Items Addressed This Visit       Cardiovascular and Mediastinum   Venous insufficiency    Suspect this is causing much of his pedal edema  Recommend avoidance of standing, elevation of feet when sitting and continued use of support stockings during the day as well as low sodium diet (reviewed)  The vasodilation of amlodipine may add as well          Other   Foot pain, bilateral    Interested in seeing podiatry  Particularly in the arches       Lower extremity edema - Primary    Suspect multifactorial with venous insufficiency as well as chronic prednisone and amlodipine use Lasix has not helped much as expected  Last BNP was normal with no h/o CHF  Will check in with pcp re: amlodipine for bp  He is curious to try 1-2 d of holding it to see how much (if any) improvement he has  Recommend Avoid standing, elevate feet when sitting and use of support socks Unfortunately the swelling is uncomfortable for him

## 2021-09-14 NOTE — Patient Instructions (Signed)
Please schedule follow up scheduled with myself in 4 months.  If my schedule is not open yet, we will contact you with a reminder closer to that time. Please call [phone] if you haven't heard from [place] a month before.  ? ?Before your next visit I would like you to have: ?CT scan of your sinuses  and follow up with ENT - we will call you for both of these. ? ?Continue inhalers  and breathing treatments. Continue allergy treatments.  ? ?

## 2021-09-14 NOTE — Patient Instructions (Signed)
I think you have venous insufficiency causing much of your swelling  ?Amlodipine and prednisone can worsen this  ?Lasix does not always help it  ?Don't stand for long periods of time  ?If /when you sit try and elevate your feet  ?Watch your sodium intake  ? ?Wear your support socks every day (not at night)  ? ?Try holding your amlodipine for 1-2 days and then let us know if the swelling improves  ? ? Podiatrist may be able to help the nails and foot pain  ? ? ? ? ?

## 2021-09-14 NOTE — Progress Notes (Signed)
? ?      ?DEQUAVIOUS Hudson    [id number]    [birth year] ? ?Primary Care Physician:Letvak, Theophilus Kinds, MD ?Date of Appointment: 09/14/2021 ?Established Patient Visit ? ?Chief complaint:   ?Chief Complaint  ?Patient presents with  ? Follow-up  ?  Pt states he is about the same since last visit and denies any real complaints.  ? ? ? ?HPI: ?Thomas Hudson is a 81 y.o. gentleman with history of allergic rhinitis who presented Nov 2021 with shortness of breath and was started on albuterol prn. Diagnosed with COPD. Symptom onset was around 2009, no childhood respiratory disease or asthma.  ? ?Interval Updates: ?Here for follow up.  ? ?Had sick visit with Roxan Diesel NP. Treated with antibiotics and prednisone.  Feels much better after that.  ?On Breztri, daliresp. He does have a significant allergic component to his flare especially in the fall. We have previously reviewed blood work and IgE and Eos are not significantly elevated.  ? ?Previous CT sinuses in 2017 reviewed and he had substantial pansinusitis.   ? ?On astelin, flonase, singulair and cetirizine for chronic allergic rhinitis. He does feel these are helping.  ? ?Has had 3 courses of prednisone in the last year for flares of bronchitis.  ? ?At baseline on 5 mg prednisone for his RA. Taking infusion therapy for his RA at [facility] - sees Dr. Dossie Der.   ? ?Takes albuterol inhaler or nebulizer every other day.  ? ?Social history: ?100 pack year history ? ?I have reviewed the patient's family social and past medical history and updated as appropriate.  ? ?Past Medical History:  ?Diagnosis Date  ? BPH (benign prostatic hyperplasia)   ? Carpal tunnel syndrome on both sides   ? Complication of anesthesia   ? Low BP and O2 sats after EGD  ? COPD (chronic obstructive pulmonary disease) ([place])   ? GERD (gastroesophageal reflux disease)   ? Hyperlipidemia   ? Hypertension   ? RAD (reactive airway disease)   ? no PMH of asthma; RAD post infection  ? Rheumatoid arthritis  ([place])   ?    ? ? ?Past Surgical History:  ?Procedure Laterality Date  ? BIOPSY  01/06/2018  ? Procedure: BIOPSY;  Surgeon: Irene Shipper, MD;  Location: [facility];  Service: Endoscopy;;  ? BIOPSY  01/03/2020  ? Procedure: BIOPSY;  Surgeon: Irene Shipper, MD;  Location: [facility];  Service: Endoscopy;;  ? CATARACT EXTRACTION W/PHACO Left 09/11/2021  ? Procedure: CATARACT EXTRACTION PHACO AND INTRAOCULAR LENS PLACEMENT ([facility]) LEFT 6.38 00:41.6;  Surgeon: Hudson Robson, MD;  Location: [place];  Service: Ophthalmology;  Laterality: Left;  ? COLONOSCOPY W/ POLYPECTOMY  2009  ? X 2; Dr.Perry   ? ERCP N/A 11/05/2016  ? Procedure: ENDOSCOPIC RETROGRADE CHOLANGIOPANCREATOGRAPHY (ERCP);  Surgeon: Irene Shipper, MD;  Location[facility] ENDOSCOPY;  Service: Endoscopy;  Laterality: N/A;  ? ESOPHAGOGASTRODUODENOSCOPY (EGD) WITH PROPOFOL N/A 07/29/2016  ? Procedure: ESOPHAGOGASTRODUODENOSCOPY (EGD) WITH PROPOFOL;  Surgeon: Irene Shipper, MD;  [facility];  Service: Endoscopy;  Laterality: N/A;  will need ERCP scope  ? ESOPHAGOGASTRODUODENOSCOPY (EGD) WITH PROPOFOL N/A 01/06/2018  ? Procedure: ESOPHAGOGASTRODUODENOSCOPY (EGD) WITH PROPOFOL;  Surgeon: Irene Shipper, MD;  [facility];  Service: Endoscopy;  Laterality: N/A;  NEED ERCP SCOPE  ? ESOPHAGOGASTRODUODENOSCOPY (EGD) WITH PROPOFOL N/A 01/03/2020  ? Procedure: ESOPHAGOGASTRODUODENOSCOPY (EGD) WITH PROPOFOL;  Surgeon: Irene Shipper, MD;  [facility];  Service: Endoscopy;  Laterality:  N/A;  Needs ERCP scope  ? HERNIA REPAIR    ? LUMBAR LAMINECTOMY  07/2008  ? UPPER GASTROINTESTINAL ENDOSCOPY    ?  gastric polyp;Dr.Perry   ? VASECTOMY    ? WHIPPLE PROCEDURE  2012  ? partial ; [place]  ? ? ?Family History  ?Problem Relation Age of Onset  ? Skin cancer Mother   ? Throat cancer Father   ?     smoker  ? Esophageal cancer Father   ? Uterine cancer Sister   ? COPD Sister   ?     X2  ? Ovarian cancer Sister   ? Throat cancer Sister   ?     smoker  ?  Breast cancer Sister   ? Lung cancer Sister   ?     smoker  ? Leukemia Sister   ? Coronary artery disease Brother   ?     in 8s  ? Lung cancer Brother   ?     kidney cancer  ? Throat cancer Brother   ? Heart disease Brother   ? Heart disease Brother   ? Heart disease Maternal Grandfather   ? Stroke Paternal Grandmother 33  ? Colon cancer Paternal Grandfather   ? Heart disease Paternal Grandfather   ? Prostate cancer Paternal Uncle   ? Heart attack Paternal Uncle   ?     3 uncles, >55  ? Asthma Neg Hx   ? ? ?Social History  ? ?Occupational History  ? Occupation: Psychologist, educational  ?  Employer: [employer]  ?  Comment: Retired  ?Tobacco Use  ? Smoking status: Former  ?  Packs/day: 2.00  ?  Years: 50.00  ?  Pack years: 100.00  ?  Types: Cigarettes  ?  Quit date: 07/08/1997  ?  Years since quitting: 24.2  ? Smokeless tobacco: Never  ?Vaping Use  ? Vaping Use: Never used  ?Substance and Sexual Activity  ? Alcohol use: No  ?  Alcohol/week: 0.0 standard drinks  ? Drug use: No  ? Sexual activity: Yes  ? ? ? ?Physical Exam: ?Blood pressure 124/70, pulse 91, temperature 98.1 ?F (36.7 ?C), temperature source Oral, height '5\' 7"'$  (1.702 m), weight 197 lb 6.4 oz (89.5 kg), SpO2 97 %. ? ?Gen:     No acute distress ?ENT: nares patent, no polyps, mild mucosal debris, more open on the right than on the left  ?Lungs:   diminished, no wheezes or crackles ?CV:        RRR, no mrg ? ? ?Data Reviewed: ?Imaging: ?I have personally reviewed the CT Chest Cardiac with limited lung windows in Feb 2021 - lung windows available are clear without nodules of effusion.  ? ?Chest xray obtained 08/21/2021 personally reviewed today - no acute process ? ?Reviewed his CT maxillofacial from 2013 and 2017 with chronic sinusitis.  ? ?PFTs: ?Mild restriction to ventilation Jan 2022 ? ?Labs: ?Lab Results  ?Component Value Date  ? WBC 6.8 07/04/2021  ? HGB 15.6 07/04/2021  ? HCT 47.4 07/04/2021  ? MCV 89.9 07/04/2021  ? PLT 253 07/04/2021  ? ?Lab Results  ?Component Value  Date  ? NA 139 08/21/2021  ? K 3.4 (L) 08/21/2021  ? CL 105 08/21/2021  ? CO2 26 08/21/2021  ? ? ? ?Immunization status: ?Immunization History  ?Administered Date(s) Administered  ? Fluad Quad(high Dose 65+) 05/12/2019, 05/12/2020  ? Influenza Split 06/25/2011, 04/15/2012  ? Influenza Whole 05/04/2009, 06/06/2010  ? Influenza, High Dose Seasonal PF 05/03/2013  ?  Influenza, Quadrivalent, Recombinant, Inj, Pf 03/28/2021  ? Influenza,inj,Quad PF,6+ Mos 04/07/2014, 04/03/2015, 04/19/2016, 05/02/2017  ? Influenza-Unspecified 05/13/2012, 04/07/2014, 04/21/2018, 04/11/2021  ? PFIZER(Purple Top)SARS-COV-2 Vaccination 07/30/2019, 08/20/2019  ? Pneumococcal Conjugate-13 04/07/2014  ? Pneumococcal Polysaccharide-23 05/04/2009, 04/07/2014, 05/02/2017  ? Td 08/08/2010  ? Zoster, Live 08/10/2012  ? ? ?Assessment:  ?COPD FEV1 85% of predicted with high symptom burden ?Chronic sinusitis - worsening control ?History of 100 pack year smoking, quit 1999 ? ?Plan/Recommendations: ?continue albuterol ?continue breztri  ?Continue singulair, flonase, zyrtec for rhinitis, with nasal saline. ?continue astelin nasal spray for ongoing rhinitis ?Continue daliresp for now.  ?Unfortunately does not have good therapeutic agents for allergic asthma/copd.  ?Will obtain repeat CT sinuses to evaluate and refer to ENT .  ? ?Return to Care: ?Return in about 4 months (around 01/14/2022). ? ? ?Lenice Llamas, MD ?Pulmonary and Critical Care Medicine ?[place] ?Office:[phone] ? ? ? ? ? ?

## 2021-09-16 ENCOUNTER — Encounter: Payer: Self-pay | Admitting: Internal Medicine

## 2021-09-16 NOTE — Assessment & Plan Note (Signed)
Suspect this is causing much of his pedal edema  ?Recommend avoidance of standing, elevation of feet when sitting and continued use of support stockings during the day as well as low sodium diet (reviewed)  ?The vasodilation of amlodipine may add as well  ? ?

## 2021-09-16 NOTE — Assessment & Plan Note (Signed)
Interested in seeing podiatry  ?Particularly in the arches  ?

## 2021-09-16 NOTE — Assessment & Plan Note (Addendum)
Suspect multifactorial with venous insufficiency as well as chronic prednisone and amlodipine use ?Lasix has not helped much as expected  ?Last BNP was normal with no h/o CHF  ?Will check in with pcp re: amlodipine for bp  ?He is curious to try 1-2 d of holding it to see how much (if any) improvement he has  ?Recommend ?Avoid standing, elevate feet when sitting and use of support socks ?Unfortunately the swelling is uncomfortable for him   ?

## 2021-09-17 DIAGNOSIS — M5442 Lumbago with sciatica, left side: Secondary | ICD-10-CM | POA: Diagnosis not present

## 2021-09-17 DIAGNOSIS — H2511 Age-related nuclear cataract, right eye: Secondary | ICD-10-CM | POA: Diagnosis not present

## 2021-09-17 DIAGNOSIS — Z789 Other specified health status: Secondary | ICD-10-CM | POA: Diagnosis not present

## 2021-09-17 DIAGNOSIS — Z7409 Other reduced mobility: Secondary | ICD-10-CM | POA: Diagnosis not present

## 2021-09-17 DIAGNOSIS — M48061 Spinal stenosis, lumbar region without neurogenic claudication: Secondary | ICD-10-CM | POA: Diagnosis not present

## 2021-09-19 DIAGNOSIS — M069 Rheumatoid arthritis, unspecified: Secondary | ICD-10-CM | POA: Diagnosis not present

## 2021-09-19 NOTE — Discharge Instructions (Signed)

## 2021-09-20 ENCOUNTER — Telehealth: Payer: Self-pay

## 2021-09-20 NOTE — Telephone Encounter (Signed)
Message ?Received: 4 days ago ?[name], MD  Pilar Grammes, CMA ?Please let him know that I generally recommend [facility]   ?  ?   ?Previous Messages ?  ?----- Message -----  ?From: Abner Greenspan, MD  ?Sent: 09/16/2021  11:17 AM EDT  ?To: [name], MD  ? ?Amlodipine may be worsening his swelling but unsure if that can be changed  ?He wants to know what podiatrist you recommend  ?

## 2021-09-20 NOTE — Telephone Encounter (Signed)
Left message on VM for pt about [facility] ?

## 2021-09-24 DIAGNOSIS — Z789 Other specified health status: Secondary | ICD-10-CM | POA: Diagnosis not present

## 2021-09-24 DIAGNOSIS — M48061 Spinal stenosis, lumbar region without neurogenic claudication: Secondary | ICD-10-CM | POA: Diagnosis not present

## 2021-09-24 DIAGNOSIS — M5442 Lumbago with sciatica, left side: Secondary | ICD-10-CM | POA: Diagnosis not present

## 2021-09-24 DIAGNOSIS — Z7409 Other reduced mobility: Secondary | ICD-10-CM | POA: Diagnosis not present

## 2021-09-25 ENCOUNTER — Ambulatory Visit
Admission: RE | Admit: 2021-09-25 | Discharge: 2021-09-25 | Disposition: A | Discharge status: Home / Self Care | Payer: Medicare Other | Source: Ambulatory Visit | Attending: Ophthalmology | Admitting: Ophthalmology

## 2021-09-25 ENCOUNTER — Encounter
Admission: RE | Disposition: A | Discharge status: Home / Self Care | Payer: Self-pay | Source: Ambulatory Visit | Attending: Ophthalmology

## 2021-09-25 ENCOUNTER — Other Ambulatory Visit: Payer: Self-pay

## 2021-09-25 ENCOUNTER — Encounter: Payer: Self-pay | Admitting: Ophthalmology

## 2021-09-25 ENCOUNTER — Ambulatory Visit: Payer: Medicare Other | Admitting: Anesthesiology

## 2021-09-25 DIAGNOSIS — H2511 Age-related nuclear cataract, right eye: Secondary | ICD-10-CM | POA: Insufficient documentation

## 2021-09-25 DIAGNOSIS — Z87891 Personal history of nicotine dependence: Secondary | ICD-10-CM | POA: Insufficient documentation

## 2021-09-25 DIAGNOSIS — H25811 Combined forms of age-related cataract, right eye: Secondary | ICD-10-CM | POA: Diagnosis not present

## 2021-09-25 HISTORY — PX: CATARACT EXTRACTION W/PHACO: SHX586

## 2021-09-25 SURGERY — PHACOEMULSIFICATION, CATARACT, WITH IOL INSERTION
Anesthesia: Monitor Anesthesia Care | Site: Eye | Laterality: Right

## 2021-09-25 MED ORDER — SIGHTPATH DOSE#1 NA CHONDROIT SULF-NA HYALURON 40-17 MG/ML IO SOLN
INTRAOCULAR | Status: DC | PRN
  Administered 2021-09-25: 1 mL via INTRAOCULAR

## 2021-09-25 MED ORDER — ONDANSETRON HCL 4 MG/2ML IJ SOLN: 4.0000 mg | Freq: Once | INTRAMUSCULAR | Status: DC | PRN

## 2021-09-25 MED ORDER — ACETAMINOPHEN 325 MG PO TABS: 325.0000 mg | ORAL_TABLET | ORAL | Status: DC | PRN

## 2021-09-25 MED ORDER — SIGHTPATH DOSE#1 BSS IO SOLN
INTRAOCULAR | Status: DC | PRN
  Administered 2021-09-25: 15 mL via INTRAOCULAR

## 2021-09-25 MED ORDER — TETRACAINE HCL 0.5 % OP SOLN
1.0000 [drp] | OPHTHALMIC | Status: DC | PRN
  Administered 2021-09-25 (×3): 1 [drp] via OPHTHALMIC

## 2021-09-25 MED ORDER — FENTANYL CITRATE (PF) 100 MCG/2ML IJ SOLN
INTRAMUSCULAR | Status: DC | PRN
  Administered 2021-09-25: 50 ug via INTRAVENOUS

## 2021-09-25 MED ORDER — SIGHTPATH DOSE#1 BSS IO SOLN
INTRAOCULAR | Status: DC | PRN
  Administered 2021-09-25: 54 mL via OPHTHALMIC

## 2021-09-25 MED ORDER — SIGHTPATH DOSE#1 BSS IO SOLN
INTRAOCULAR | Status: DC | PRN
  Administered 2021-09-25: 2 mL

## 2021-09-25 MED ORDER — BRIMONIDINE TARTRATE-TIMOLOL 0.2-0.5 % OP SOLN
OPHTHALMIC | Status: DC | PRN
  Administered 2021-09-25: 1 [drp] via OPHTHALMIC

## 2021-09-25 MED ORDER — MOXIFLOXACIN HCL 0.5 % OP SOLN
OPHTHALMIC | Status: DC | PRN
  Administered 2021-09-25: 0.2 mL via OPHTHALMIC

## 2021-09-25 MED ORDER — ACETAMINOPHEN 160 MG/5ML PO SOLN: 325.0000 mg | ORAL | Status: DC | PRN

## 2021-09-25 MED ORDER — MIDAZOLAM HCL 2 MG/2ML IJ SOLN
INTRAMUSCULAR | Status: DC | PRN
  Administered 2021-09-25 (×2): 1 mg via INTRAVENOUS

## 2021-09-25 MED ORDER — ARMC OPHTHALMIC DILATING DROPS
1.0000 "application " | OPHTHALMIC | Status: DC | PRN
  Administered 2021-09-25 (×3): 1 via OPHTHALMIC

## 2021-09-25 SURGICAL SUPPLY — 12 items
CANNULA ANT/CHMB 27G (MISCELLANEOUS) IMPLANT
CANNULA ANT/CHMB 27GA (MISCELLANEOUS) IMPLANT
CATARACT SUITE SIGHTPATH (MISCELLANEOUS) ×2 IMPLANT
FEE CATARACT SUITE SIGHTPATH (MISCELLANEOUS) ×2 IMPLANT
GLOVE SURG ENC TEXT LTX SZ8 (GLOVE) ×3 IMPLANT
GLOVE SURG TRIUMPH 8.0 PF LTX (GLOVE) ×3 IMPLANT
LENS IOL TECNIS EYHANCE 20.0 (Intraocular Lens) ×1 IMPLANT
NDL FILTER BLUNT 18X1 1/2 (NEEDLE) ×2 IMPLANT
NEEDLE FILTER BLUNT 18X 1/2SAF (NEEDLE) ×1
NEEDLE FILTER BLUNT 18X1 1/2 (NEEDLE) ×1 IMPLANT
SYR 3ML LL SCALE MARK (SYRINGE) ×3 IMPLANT
WATER STERILE IRR 250ML POUR (IV SOLUTION) ×3 IMPLANT

## 2021-09-25 NOTE — Op Note (Signed)
PREOPERATIVE DIAGNOSIS:  Nuclear sclerotic cataract of the right eye. ?  ?POSTOPERATIVE DIAGNOSIS:  Cataract ?  ?OPERATIVE PROCEDURE:ORPROCALL@ ?  ?SURGEON:  Birder Robson, MD. ?  ?ANESTHESIA: ? ?Anesthesiologist: Veda Canning, MD ?CRNA: Mayme Genta, CRNA ? ?1.      Managed anesthesia care. ?2.      0.4m of Shugarcaine was instilled in the eye following the paracentesis. ?  ?COMPLICATIONS:  None. ?  ?TECHNIQUE:   Stop and chop ?  ?DESCRIPTION OF PROCEDURE:  The patient was examined and consented in the preoperative holding area where the aforementioned topical anesthesia was applied to the right eye and then brought back to the Operating Room where the right eye was prepped and draped in the usual sterile ophthalmic fashion and a lid speculum was placed. A paracentesis was created with the side port blade and the anterior chamber was filled with viscoelastic. A near clear corneal incision was performed with the steel keratome. A continuous curvilinear capsulorrhexis was performed with a cystotome followed by the capsulorrhexis forceps. Hydrodissection and hydrodelineation were carried out with BSS on a blunt cannula. The lens was removed in a stop and chop  technique and the remaining cortical material was removed with the irrigation-aspiration handpiece. The capsular bag was inflated with viscoelastic and the Technis [id number]  lens was placed in the capsular bag without complication. The remaining viscoelastic was removed from the eye with the irrigation-aspiration handpiece. The wounds were hydrated. The anterior chamber was flushed with BSS and the eye was inflated to physiologic pressure. 0.137mof Vigamox was placed in the anterior chamber. The wounds were found to be water tight. The eye was dressed with Combigan. The patient was given protective glasses to wear throughout the day and a shield with which to sleep tonight. The patient was also given drops with which to begin a drop regimen today and will  follow-up with me in one day. ?Implant Name Type Inv. Item Serial No. Manufacturer Lot No. LRB No. Used Action  ?LENS IOL TECNIS EYHANCE 20.0 - [id number] Lens LENS IOL TECNIS EYHANCE 20.0 [id number]  Right 1 Implanted  ? ?Procedure(s): ?CATARACT EXTRACTION PHACO AND INTRAOCULAR LENS PLACEMENT (IOC) RIGHT 8.01 00:49.9 (Right) ? ?Electronically signed: WiBirder Robson/21/2023 11:23 AM ? ?

## 2021-09-25 NOTE — Anesthesia Postprocedure Evaluation (Signed)
Anesthesia Post Note ? ?Patient: Thomas Hudson ? ?Procedure(s) Performed: CATARACT EXTRACTION PHACO AND INTRAOCULAR LENS PLACEMENT ([facility]) RIGHT 8.01 00:49.9 (Right: Eye) ? ? ?  ?Patient location during evaluation: PACU ?Anesthesia Type: MAC ?Level of consciousness: awake ?Pain management: pain level controlled ?Vital Signs Assessment: post-procedure vital signs reviewed and stable ?Respiratory status: respiratory function stable ?Cardiovascular status: stable ?Postop Assessment: no apparent nausea or vomiting ?Anesthetic complications: no ? ? ?No notable events documented. ? ?Veda Canning ? ? ? ? ? ?

## 2021-09-25 NOTE — Transfer of Care (Signed)
Immediate Anesthesia Transfer of Care Note ? ?Patient: Thomas Hudson ? ?Procedure(s) Performed: CATARACT EXTRACTION PHACO AND INTRAOCULAR LENS PLACEMENT ([facility]) RIGHT 8.01 00:49.9 (Right: Eye) ? ?Patient Location: PACU ? ?Anesthesia Type: MAC ? ?Level of Consciousness: awake, alert  and patient cooperative ? ?Airway and Oxygen Therapy: Patient Spontanous Breathing and Patient connected to supplemental oxygen ? ?Post-op Assessment: Post-op Vital signs reviewed, Patient's Cardiovascular Status Stable, Respiratory Function Stable, Patent Airway and No signs of Nausea or vomiting ? ?Post-op Vital Signs: Reviewed and stable ? ?Complications: No notable events documented. ? ?

## 2021-09-25 NOTE — Anesthesia Procedure Notes (Signed)
Procedure Name: [place] ?Date/Time: 09/25/2021 11:08 AM ?Performed by: Mayme Genta, CRNA ?Pre-anesthesia Checklist: Patient identified, Emergency Drugs available, Suction available, Timeout performed and Patient being monitored ?Patient Re-evaluated:Patient Re-evaluated prior to induction ?Oxygen Delivery Method: Nasal cannula ?Placement Confirmation: positive ETCO2 ? ? ? ? ?

## 2021-09-25 NOTE — Anesthesia Preprocedure Evaluation (Signed)
Anesthesia Evaluation  ?Patient identified by MRN, date of birth, ID band ?Patient awake ? ? ? ?Reviewed: ?Allergy & Precautions, H&P , NPO status  ? ?History of Anesthesia Complications ?(+) history of anesthetic complications (low BP and O2 sats after EGD 08/2021) ? ?Airway ?Mallampati: II ? ?TM Distance: >3 FB ?Neck ROM: full ? ? ? Dental ?no notable dental hx. ? ?  ?Pulmonary ?COPD, former smoker,  ?  ?Pulmonary exam normal ?breath sounds clear to auscultation ? ? ? ? ? ? Cardiovascular ?Exercise Tolerance: Good ?hypertension,  ?Rhythm:regular Rate:Normal ? ? ?  ?Neuro/Psych ?  ? GI/Hepatic ?GERD  ,  ?Endo/Other  ?BMI > 30 ? Renal/GU ?  ? ?  ?Musculoskeletal ? ?(+) Arthritis , Rheumatoid disorders,   ? Abdominal ?  ?Peds ? Hematology ?  ?Anesthesia Other Findings ? ? Reproductive/Obstetrics ? ?  ? ? ? ? ? ? ? ? ? ? ? ? ? ?  ?  ? ? ? ? ? ? ? ? ?Anesthesia Physical ? ?Anesthesia Plan ? ?ASA: 3 ? ?Anesthesia Plan: MAC  ? ?Post-op Pain Management:   ? ?Induction:  ? ?PONV Risk Score and Plan: 1 and TIVA, Midazolam and Treatment may vary due to age or medical condition ? ?Airway Management Planned:  ? ?Additional Equipment:  ? ?Intra-op Plan:  ? ?Post-operative Plan:  ? ?Informed Consent: I have reviewed the patients History and Physical, chart, labs and discussed the procedure including the risks, benefits and alternatives for the proposed anesthesia with the patient or authorized representative who has indicated his/her understanding and acceptance.  ? ? ? ?Dental Advisory Given ? ?Plan Discussed with: CRNA ? ?Anesthesia Plan Comments:   ? ? ? ? ? ? ?Anesthesia Quick Evaluation ? ?

## 2021-09-25 NOTE — H&P (Signed)
[place]  ? ?Primary Care Physician:  [name], MD ?Ophthalmologist: Dr. Benay Pillow ? ?Pre-Procedure History & Physical: ?HPI:  Thomas Hudson is a 81 y.o. male here for cataract surgery. ?  ?Past Medical History:  ?Diagnosis Date  ? BPH (benign prostatic hyperplasia)   ? Carpal tunnel syndrome on both sides   ? Complication of anesthesia   ? Low BP and O2 sats after EGD  ? COPD (chronic obstructive pulmonary disease) ([place])   ? GERD (gastroesophageal reflux disease)   ? Hyperlipidemia   ? Hypertension   ? RAD (reactive airway disease)   ? no PMH of asthma; RAD post infection  ? Rheumatoid arthritis ([place])   ?    ? ? ?Past Surgical History:  ?Procedure Laterality Date  ? BIOPSY  01/06/2018  ? Procedure: BIOPSY;  Surgeon: Irene Shipper, MD;  Location: [facility];  Service: Endoscopy;;  ? BIOPSY  01/03/2020  ? Procedure: BIOPSY;  Surgeon: Irene Shipper, MD;  Location: [facility];  Service: Endoscopy;;  ? CATARACT EXTRACTION W/PHACO Left 09/11/2021  ? Procedure: CATARACT EXTRACTION PHACO AND INTRAOCULAR LENS PLACEMENT ([facility]) LEFT 6.38 00:41.6;  Surgeon: Birder Robson, MD;  Location: [place];  Service: Ophthalmology;  Laterality: Left;  ? COLONOSCOPY W/ POLYPECTOMY  2009  ? X 2; Dr.Perry   ? ERCP N/A 11/05/2016  ? Procedure: ENDOSCOPIC RETROGRADE CHOLANGIOPANCREATOGRAPHY (ERCP);  Surgeon: Irene Shipper, MD;  Location: [facility] ENDOSCOPY;  Service: Endoscopy;  Laterality: N/A;  ? ESOPHAGOGASTRODUODENOSCOPY (EGD) WITH PROPOFOL N/A 07/29/2016  ? Procedure: ESOPHAGOGASTRODUODENOSCOPY (EGD) WITH PROPOFOL;  Surgeon: Irene Shipper, MD;  Location: [facility];  Service: Endoscopy;  Laterality: N/A;  will need ERCP scope  ? ESOPHAGOGASTRODUODENOSCOPY (EGD) WITH PROPOFOL N/A 01/06/2018  ? Procedure: ESOPHAGOGASTRODUODENOSCOPY (EGD) WITH PROPOFOL;  Surgeon: Irene Shipper, MD;  Location: [facility];  Service: Endoscopy;  Laterality: N/A;  NEED ERCP SCOPE  ? ESOPHAGOGASTRODUODENOSCOPY (EGD) WITH PROPOFOL N/A  01/03/2020  ? Procedure: ESOPHAGOGASTRODUODENOSCOPY (EGD) WITH PROPOFOL;  Surgeon: Irene Shipper, MD;  Location: [facility];  Service: Endoscopy;  Laterality: N/A;  Needs ERCP scope  ? HERNIA REPAIR    ? LUMBAR LAMINECTOMY  07/2008  ? UPPER GASTROINTESTINAL ENDOSCOPY    ?  gastric polyp;Dr.Perry   ? VASECTOMY    ? WHIPPLE PROCEDURE  2012  ? partial ; [place]  ? ? ?Prior to Admission medications   ?Medication Sig Start Date End Date Taking? Authorizing Provider  ?albuterol (PROVENTIL) (2.5 MG/3ML) 0.083% nebulizer solution Take 3 mLs (2.5 mg total) by nebulization every 6 (six) hours as needed for wheezing or shortness of breath. 06/19/21 10/17/21 Yes Spero Geralds, MD  ?albuterol (VENTOLIN HFA) 108 (90 Base) MCG/ACT inhaler Inhale 2 puffs into the lungs every 6 (six) hours as needed for wheezing or shortness of breath. 07/31/20  Yes Spero Geralds, MD  ?alfuzosin (UROXATRAL) 10 MG 24 hr tablet TAKE 1 TABLET(10 MG) BY MOUTH DAILY 07/05/21  Yes [name], MD  ?amLODipine (NORVASC) 5 MG tablet Take 1 tablet (5 mg total) by mouth daily. 07/05/21  Yes [name], MD  ?aspirin-acetaminophen-caffeine (EXCEDRIN MIGRAINE) [phone] MG tablet Take 2 tablets by mouth 3 (three) times daily as needed for headache.   Yes [provider]  ?azelastine (ASTELIN) 0.1 % nasal spray Place 1 spray into both nostrils 2 (two) times daily. Use in each nostril as directed 06/19/21  Yes Spero Geralds, MD  ?benzonatate (TESSALON) 200 MG capsule Take 1  capsule (200 mg total) by mouth 3 (three) times daily as needed for cough. 08/21/21  Yes Cobb, Karie Schwalbe, NP  ?betamethasone, augmented, (DIPROLENE) 0.05 % lotion Apply 1 application. topically daily. 09/05/21  Yes [provider]  ?Certolizumab Pegol (CIMZIA) 2 X 200 MG KIT Inject 2 each into the skin every 30 (thirty) days.   Yes [provider]  ?cetirizine (ZYRTEC) 10 MG tablet Take 1 tablet (10 mg total) by mouth at bedtime. 07/05/21  Yes [name], MD  ?doxycycline (MONODOX) 50 MG capsule Take 50 mg by mouth daily. 08/14/20  Yes [provider]  ?erythromycin ophthalmic ointment Place 1 application into both eyes daily as needed (dry eyes).   Yes [provider]  ?ferrous sulfate 325 (65 FE) MG tablet Take 1 tablet (325 mg total) by mouth 3 (three) times daily with meals. 07/05/21  Yes [name], MD  ?finasteride (PROSCAR) 5 MG tablet Take 1 tablet (5 mg total) by mouth daily. 07/05/21  Yes [name], MD  ?fluticasone (FLONASE) 50 MCG/ACT nasal spray Place 2 sprays into both nostrils daily as needed for allergies. 07/05/21  Yes [name], MD  ?folic acid (FOLVITE) 1 MG tablet Take 1 mg by mouth daily.  04/10/18  Yes [provider]  ?furosemide (LASIX) 40 MG tablet Take 1 tablet (40 mg total) by mouth daily as needed. For increased leg swelling ?Patient taking differently: Take 40 mg by mouth every other day. For increased leg swelling 07/05/21  Yes [name], MD  ?gabapentin (NEURONTIN) 300 MG capsule Take 300-600 mg by mouth See admin instructions. Take 1 capsule (300 mg) by mouth in the morning & take 2 capsules (600 mg) by mouth at night. 10/14/19  Yes [provider]  ?losartan (COZAAR) 100 MG tablet Take 1 tablet (100 mg total) by mouth daily. 07/05/21  Yes [name], MD  ?montelukast (SINGULAIR) 10 MG tablet Take 1 tablet (10 mg total) by mouth at bedtime. 07/05/21  Yes [name], MD  ?Multiple Vitamin (MULTIVITAMIN WITH MINERALS) TABS tablet Take 1 tablet by mouth daily.   Yes [provider]  ?omeprazole (PRILOSEC) 20 MG capsule Take 1 capsule (20 mg total) by mouth daily. 07/05/21  Yes [name], MD  ?potassium chloride SA (KLOR-CON M) 20 MEQ tablet Take 1 tablet (20 mEq total) by mouth daily. 07/05/21  Yes [name], MD  ?predniSONE (DELTASONE) 10 MG tablet 4 tabs for 2 days, then 3 tabs for 2 days, 2 tabs for 2 days, then 1 tab for 2  days, then return to your normal daily 5 mg dose 08/21/21  Yes Cobb, Karie Schwalbe, NP  ?predniSONE (DELTASONE) 5 MG tablet Take 5 mg by mouth See admin instructions. Take 1 tablet daily 09/02/19  Yes [provider]  ?roflumilast (DALIRESP) 500 MCG TABS tablet Take 1 tablet (500 mcg total) by mouth daily. 08/13/21  Yes Spero Geralds, MD  ?rosuvastatin (CRESTOR) 10 MG tablet Take 10 mg by mouth daily.   Yes [provider]  ? ? ?Allergies as of 08/17/2021 - Review Complete 08/02/2021  ?Allergen Reaction Noted  ? Morphine and related Other (See Comments) 07/24/2012  ? ? ?Family History  ?Problem Relation Age of Onset  ? Skin cancer Mother   ? Throat cancer Father   ?     smoker  ? Esophageal cancer Father   ? Uterine cancer Sister   ? COPD Sister   ?  X2  ? Ovarian cancer Sister   ? Throat cancer Sister   ?     smoker  ? Breast cancer Sister   ? Lung cancer Sister   ?     smoker  ? Leukemia Sister   ? Coronary artery disease Brother   ?     in 24s  ? Lung cancer Brother   ?     kidney cancer  ? Throat cancer Brother   ? Heart disease Brother   ? Heart disease Brother   ? Heart disease Maternal Grandfather   ? Stroke Paternal Grandmother 20  ? Colon cancer Paternal Grandfather   ? Heart disease Paternal Grandfather   ? Prostate cancer Paternal Uncle   ? Heart attack Paternal Uncle   ?     3 uncles, >55  ? Asthma Neg Hx   ? ? ?Social History  ? ?Socioeconomic History  ? Marital status: Married  ?  Spouse name: Not on file  ? Number of children: 2  ? Years of education: Not on file  ? Highest education level: Not on file  ?Occupational History  ? Occupation: Psychologist, educational  ?  Employer: [employer]  ?  Comment: Retired  ?Tobacco Use  ? Smoking status: Former  ?  Packs/day: 2.00  ?  Years: 50.00  ?  Pack years: 100.00  ?  Types: Cigarettes  ?  Quit date: 07/08/1997  ?  Years since quitting: 24.2  ? Smokeless tobacco: Never  ?Vaping Use  ? Vaping Use: Never used  ?Substance and Sexual Activity  ? Alcohol use: No  ?   Alcohol/week: 0.0 standard drinks  ? Drug use: No  ? Sexual activity: Yes  ?Other Topics Concern  ? Not on file  ?Social History Narrative  ? Has living will  ? Requests daughters as health care POA

## 2021-09-26 ENCOUNTER — Encounter: Payer: Self-pay | Admitting: Ophthalmology

## 2021-09-27 DIAGNOSIS — D485 Neoplasm of uncertain behavior of skin: Secondary | ICD-10-CM | POA: Diagnosis not present

## 2021-09-27 DIAGNOSIS — L929 Granulomatous disorder of the skin and subcutaneous tissue, unspecified: Secondary | ICD-10-CM | POA: Diagnosis not present

## 2021-09-27 DIAGNOSIS — Z20822 Contact with and (suspected) exposure to covid-19: Secondary | ICD-10-CM | POA: Diagnosis not present

## 2021-10-01 DIAGNOSIS — Z789 Other specified health status: Secondary | ICD-10-CM | POA: Diagnosis not present

## 2021-10-01 DIAGNOSIS — Z7409 Other reduced mobility: Secondary | ICD-10-CM | POA: Diagnosis not present

## 2021-10-01 DIAGNOSIS — M48061 Spinal stenosis, lumbar region without neurogenic claudication: Secondary | ICD-10-CM | POA: Diagnosis not present

## 2021-10-01 DIAGNOSIS — M5442 Lumbago with sciatica, left side: Secondary | ICD-10-CM | POA: Diagnosis not present

## 2021-10-03 ENCOUNTER — Encounter: Payer: Self-pay | Admitting: Internal Medicine

## 2021-10-03 DIAGNOSIS — R0609 Other forms of dyspnea: Secondary | ICD-10-CM

## 2021-10-03 DIAGNOSIS — Z87891 Personal history of nicotine dependence: Secondary | ICD-10-CM

## 2021-10-03 MED ORDER — ALBUTEROL SULFATE HFA 108 (90 BASE) MCG/ACT IN AERS: 2.0000 | INHALATION_SPRAY | Freq: Four times a day (QID) | RESPIRATORY_TRACT | 4 refills | Status: DC | PRN

## 2021-10-03 NOTE — Telephone Encounter (Signed)
Please advise 

## 2021-10-09 ENCOUNTER — Encounter: Payer: Self-pay | Admitting: Internal Medicine

## 2021-10-09 NOTE — Addendum Note (Signed)
Addended by: Dessie Coma on: 10/09/2021 11:49 AM ? ? Modules accepted: Orders ? ?

## 2021-10-18 DIAGNOSIS — H0011 Chalazion right upper eyelid: Secondary | ICD-10-CM | POA: Diagnosis not present

## 2021-10-26 DIAGNOSIS — M48062 Spinal stenosis, lumbar region with neurogenic claudication: Secondary | ICD-10-CM | POA: Diagnosis not present

## 2021-10-26 DIAGNOSIS — M5126 Other intervertebral disc displacement, lumbar region: Secondary | ICD-10-CM | POA: Diagnosis not present

## 2021-10-26 DIAGNOSIS — M438X6 Other specified deforming dorsopathies, lumbar region: Secondary | ICD-10-CM | POA: Diagnosis not present

## 2021-10-26 DIAGNOSIS — M48061 Spinal stenosis, lumbar region without neurogenic claudication: Secondary | ICD-10-CM | POA: Insufficient documentation

## 2021-10-26 DIAGNOSIS — M545 Low back pain, unspecified: Secondary | ICD-10-CM | POA: Diagnosis not present

## 2021-10-26 DIAGNOSIS — M5116 Intervertebral disc disorders with radiculopathy, lumbar region: Secondary | ICD-10-CM | POA: Diagnosis not present

## 2021-10-26 DIAGNOSIS — R29818 Other symptoms and signs involving the nervous system: Secondary | ICD-10-CM | POA: Insufficient documentation

## 2021-10-26 DIAGNOSIS — M2578 Osteophyte, vertebrae: Secondary | ICD-10-CM | POA: Diagnosis not present

## 2021-10-26 DIAGNOSIS — M5416 Radiculopathy, lumbar region: Secondary | ICD-10-CM | POA: Diagnosis not present

## 2021-10-26 DIAGNOSIS — Z20822 Contact with and (suspected) exposure to covid-19: Secondary | ICD-10-CM | POA: Diagnosis not present

## 2021-10-26 DIAGNOSIS — I700 Atherosclerosis of aorta: Secondary | ICD-10-CM | POA: Diagnosis not present

## 2021-10-29 ENCOUNTER — Ambulatory Visit: Payer: Medicare Other | Admitting: Internal Medicine

## 2021-10-31 DIAGNOSIS — M069 Rheumatoid arthritis, unspecified: Secondary | ICD-10-CM | POA: Diagnosis not present

## 2021-11-02 DIAGNOSIS — M5416 Radiculopathy, lumbar region: Secondary | ICD-10-CM | POA: Diagnosis not present

## 2021-11-02 DIAGNOSIS — M48061 Spinal stenosis, lumbar region without neurogenic claudication: Secondary | ICD-10-CM | POA: Diagnosis not present

## 2021-11-08 DIAGNOSIS — D1801 Hemangioma of skin and subcutaneous tissue: Secondary | ICD-10-CM | POA: Diagnosis not present

## 2021-11-08 DIAGNOSIS — L821 Other seborrheic keratosis: Secondary | ICD-10-CM | POA: Diagnosis not present

## 2021-11-08 DIAGNOSIS — D225 Melanocytic nevi of trunk: Secondary | ICD-10-CM | POA: Diagnosis not present

## 2021-11-08 DIAGNOSIS — Z85828 Personal history of other malignant neoplasm of skin: Secondary | ICD-10-CM | POA: Diagnosis not present

## 2021-11-08 DIAGNOSIS — D2272 Melanocytic nevi of left lower limb, including hip: Secondary | ICD-10-CM | POA: Diagnosis not present

## 2021-11-08 DIAGNOSIS — L570 Actinic keratosis: Secondary | ICD-10-CM | POA: Diagnosis not present

## 2021-11-08 DIAGNOSIS — D2271 Melanocytic nevi of right lower limb, including hip: Secondary | ICD-10-CM | POA: Diagnosis not present

## 2021-11-10 DIAGNOSIS — Z20822 Contact with and (suspected) exposure to covid-19: Secondary | ICD-10-CM | POA: Diagnosis not present

## 2021-11-13 DIAGNOSIS — Z87891 Personal history of nicotine dependence: Secondary | ICD-10-CM | POA: Diagnosis not present

## 2021-11-13 DIAGNOSIS — J3489 Other specified disorders of nose and nasal sinuses: Secondary | ICD-10-CM | POA: Diagnosis not present

## 2021-11-13 DIAGNOSIS — J342 Deviated nasal septum: Secondary | ICD-10-CM | POA: Diagnosis not present

## 2021-11-13 DIAGNOSIS — Z8709 Personal history of other diseases of the respiratory system: Secondary | ICD-10-CM | POA: Diagnosis not present

## 2021-11-14 DIAGNOSIS — Z87891 Personal history of nicotine dependence: Secondary | ICD-10-CM | POA: Diagnosis not present

## 2021-11-14 DIAGNOSIS — R1310 Dysphagia, unspecified: Secondary | ICD-10-CM | POA: Diagnosis not present

## 2021-11-14 DIAGNOSIS — J449 Chronic obstructive pulmonary disease, unspecified: Secondary | ICD-10-CM | POA: Diagnosis not present

## 2021-11-14 DIAGNOSIS — J441 Chronic obstructive pulmonary disease with (acute) exacerbation: Secondary | ICD-10-CM | POA: Diagnosis not present

## 2021-11-14 DIAGNOSIS — K5900 Constipation, unspecified: Secondary | ICD-10-CM | POA: Diagnosis not present

## 2021-11-14 DIAGNOSIS — M159 Polyosteoarthritis, unspecified: Secondary | ICD-10-CM | POA: Diagnosis not present

## 2021-11-14 DIAGNOSIS — Z885 Allergy status to narcotic agent status: Secondary | ICD-10-CM | POA: Diagnosis not present

## 2021-11-14 DIAGNOSIS — G8929 Other chronic pain: Secondary | ICD-10-CM | POA: Diagnosis not present

## 2021-11-14 DIAGNOSIS — Z7951 Long term (current) use of inhaled steroids: Secondary | ICD-10-CM | POA: Diagnosis not present

## 2021-11-14 DIAGNOSIS — Z636 Dependent relative needing care at home: Secondary | ICD-10-CM | POA: Diagnosis not present

## 2021-11-14 DIAGNOSIS — M5416 Radiculopathy, lumbar region: Secondary | ICD-10-CM | POA: Diagnosis not present

## 2021-11-14 DIAGNOSIS — E538 Deficiency of other specified B group vitamins: Secondary | ICD-10-CM | POA: Diagnosis not present

## 2021-11-14 DIAGNOSIS — M1990 Unspecified osteoarthritis, unspecified site: Secondary | ICD-10-CM | POA: Insufficient documentation

## 2021-11-14 DIAGNOSIS — I872 Venous insufficiency (chronic) (peripheral): Secondary | ICD-10-CM | POA: Diagnosis not present

## 2021-11-14 DIAGNOSIS — I10 Essential (primary) hypertension: Secondary | ICD-10-CM | POA: Diagnosis not present

## 2021-11-14 DIAGNOSIS — G5600 Carpal tunnel syndrome, unspecified upper limb: Secondary | ICD-10-CM | POA: Insufficient documentation

## 2021-11-14 DIAGNOSIS — M48061 Spinal stenosis, lumbar region without neurogenic claudication: Secondary | ICD-10-CM | POA: Diagnosis not present

## 2021-11-14 DIAGNOSIS — Z85068 Personal history of other malignant neoplasm of small intestine: Secondary | ICD-10-CM | POA: Diagnosis not present

## 2021-11-14 DIAGNOSIS — M5440 Lumbago with sciatica, unspecified side: Secondary | ICD-10-CM | POA: Diagnosis not present

## 2021-11-27 DIAGNOSIS — H0012 Chalazion right lower eyelid: Secondary | ICD-10-CM | POA: Diagnosis not present

## 2021-11-28 DIAGNOSIS — M069 Rheumatoid arthritis, unspecified: Secondary | ICD-10-CM | POA: Diagnosis not present

## 2021-11-28 DIAGNOSIS — Z125 Encounter for screening for malignant neoplasm of prostate: Secondary | ICD-10-CM | POA: Diagnosis not present

## 2021-11-29 DIAGNOSIS — M25569 Pain in unspecified knee: Secondary | ICD-10-CM | POA: Diagnosis not present

## 2021-11-29 DIAGNOSIS — M5136 Other intervertebral disc degeneration, lumbar region: Secondary | ICD-10-CM | POA: Diagnosis not present

## 2021-11-29 DIAGNOSIS — M0600 Rheumatoid arthritis without rheumatoid factor, unspecified site: Secondary | ICD-10-CM | POA: Diagnosis not present

## 2021-11-29 DIAGNOSIS — M1990 Unspecified osteoarthritis, unspecified site: Secondary | ICD-10-CM | POA: Diagnosis not present

## 2021-11-29 DIAGNOSIS — M150 Primary generalized (osteo)arthritis: Secondary | ICD-10-CM | POA: Diagnosis not present

## 2021-11-29 DIAGNOSIS — Z79899 Other long term (current) drug therapy: Secondary | ICD-10-CM | POA: Diagnosis not present

## 2021-12-04 DIAGNOSIS — J309 Allergic rhinitis, unspecified: Secondary | ICD-10-CM | POA: Diagnosis not present

## 2021-12-04 DIAGNOSIS — J342 Deviated nasal septum: Secondary | ICD-10-CM | POA: Diagnosis not present

## 2021-12-04 DIAGNOSIS — J329 Chronic sinusitis, unspecified: Secondary | ICD-10-CM | POA: Diagnosis not present

## 2021-12-04 DIAGNOSIS — J341 Cyst and mucocele of nose and nasal sinus: Secondary | ICD-10-CM | POA: Diagnosis not present

## 2021-12-05 DIAGNOSIS — R202 Paresthesia of skin: Secondary | ICD-10-CM | POA: Diagnosis not present

## 2021-12-05 DIAGNOSIS — M19072 Primary osteoarthritis, left ankle and foot: Secondary | ICD-10-CM | POA: Diagnosis not present

## 2021-12-05 DIAGNOSIS — R200 Anesthesia of skin: Secondary | ICD-10-CM | POA: Diagnosis not present

## 2021-12-05 DIAGNOSIS — R29898 Other symptoms and signs involving the musculoskeletal system: Secondary | ICD-10-CM | POA: Diagnosis not present

## 2021-12-11 DIAGNOSIS — R1030 Lower abdominal pain, unspecified: Secondary | ICD-10-CM | POA: Diagnosis not present

## 2021-12-11 DIAGNOSIS — K219 Gastro-esophageal reflux disease without esophagitis: Secondary | ICD-10-CM | POA: Diagnosis not present

## 2021-12-21 DIAGNOSIS — M3501 Sicca syndrome with keratoconjunctivitis: Secondary | ICD-10-CM | POA: Diagnosis not present

## 2021-12-26 DIAGNOSIS — M069 Rheumatoid arthritis, unspecified: Secondary | ICD-10-CM | POA: Diagnosis not present

## 2022-01-10 ENCOUNTER — Other Ambulatory Visit (HOSPITAL_COMMUNITY): Payer: Medicare Other

## 2022-01-14 ENCOUNTER — Ambulatory Visit: Payer: Medicare Other | Admitting: Internal Medicine

## 2022-01-15 ENCOUNTER — Encounter: Payer: Self-pay | Admitting: Internal Medicine

## 2022-01-15 ENCOUNTER — Ambulatory Visit (INDEPENDENT_AMBULATORY_CARE_PROVIDER_SITE_OTHER): Payer: Medicare Other | Admitting: Internal Medicine

## 2022-01-15 VITALS — BP 122/70 | HR 73 | Ht 67.0 in | Wt 202.6 lb

## 2022-01-15 DIAGNOSIS — Z87891 Personal history of nicotine dependence: Secondary | ICD-10-CM | POA: Diagnosis not present

## 2022-01-15 DIAGNOSIS — J449 Chronic obstructive pulmonary disease, unspecified: Secondary | ICD-10-CM

## 2022-01-15 DIAGNOSIS — J301 Allergic rhinitis due to pollen: Secondary | ICD-10-CM

## 2022-01-15 MED ORDER — AZELASTINE HCL 0.1 % NA SOLN: 1.0000 | Freq: Two times a day (BID) | NASAL | 3 refills | Status: DC

## 2022-01-15 MED ORDER — FLUTICASONE PROPIONATE 50 MCG/ACT NA SUSP: 2.0000 | Freq: Every day | NASAL | 3 refills | Status: DC | PRN

## 2022-01-15 MED ORDER — BREZTRI AEROSPHERE 160-9-4.8 MCG/ACT IN AERO: 2.0000 | INHALATION_SPRAY | Freq: Two times a day (BID) | RESPIRATORY_TRACT | 6 refills | Status: DC

## 2022-01-15 NOTE — Patient Instructions (Signed)
Please schedule follow up scheduled with myself in 4 months.  If my schedule is not open yet, we will contact you with a reminder closer to that time. Please call [phone] if you haven't heard from [place] a month before.   Continue your medications for COPD Breztri, albuterol as needed.  Daliresp  For Sinus issues Flonase, astelin, singulair and zyrtec.   Please let me know if any flares that require a sooner visit.

## 2022-01-15 NOTE — Progress Notes (Signed)
Thomas Hudson    [id number]    01/30/1941  Primary Care Physician:Letvak, Theophilus Kinds, MD Date of Appointment: 01/15/2022 Established Patient Visit  Chief complaint:   Chief Complaint  Patient presents with   Follow-up    PT f/u, breathing been okay other than air pollution.     HPI: Thomas Hudson is a 81 y.o. gentleman with history of allergic rhinitis who presented Nov 2021 with shortness of breath and was started on albuterol prn. Diagnosed with COPD. Symptom onset was around 2009, no childhood respiratory disease or asthma.   Interval Updates: Here for follow up.  He is her with his wife today.   No COPD flares since last appointment with me in March. Otherwies usually has 3-4 a year requiring prednisone.   Feels like his sinus issues has really leveled out at improved. At last visit I sent him to ENT. No severe sinus disease. Was continued on flonase, astelin. Continued on singulair, cetirizine as well. These do help his symptoms and feels well controlled .  On Breztri, daliresp. He does have a significant allergic component to his flare especially in the fall. We have previously reviewed blood work and IgE and Eos are not significantly elevated.   Had cataract surgery since last visit. Vision has improved.   At baseline on 5 mg prednisone for his RA. Taking infusion [facility] for his RA [facility] - sees Dr. Dossie Der.    Takes albuterol inhaler or nebulizer every other day.   Social history: 100 pack year history  I have reviewed the patient's family social and past medical history and updated as appropriate.   Past Medical History:  Diagnosis Date   BPH (benign prostatic hyperplasia)    Carpal tunnel syndrome on both sides    Complication of anesthesia    Low BP and O2 sats after EGD   COPD (chronic obstructive pulmonary disease) ([facility])    GERD (gastroesophageal reflux disease)    Hyperlipidemia    Hypertension    RAD (reactive airway disease)     no PMH of asthma; RAD post infection   Rheumatoid arthritis ([place])         Past Surgical History:  Procedure Laterality Date   BIOPSY  01/06/2018   Procedure: BIOPSY;  Surgeon: Irene Shipper, MD;  Location: [facility];  Service: Endoscopy;;   BIOPSY  01/03/2020   Procedure: BIOPSY;  Surgeon: Irene Shipper, MD;  Location: [facility];  Service: Endoscopy;;   CATARACT EXTRACTION W/PHACO Left 09/11/2021   Procedure: CATARACT EXTRACTION PHACO AND INTRAOCULAR LENS PLACEMENT ([place]) LEFT 6.38 00:41.6;  Surgeon: Birder Robson, MD;  Location: [place];  Service: Ophthalmology;  Laterality: Left;   CATARACT EXTRACTION W/PHACO Right 09/25/2021   Procedure: CATARACT EXTRACTION PHACO AND INTRAOCULAR LENS PLACEMENT (I[facility]) RIGHT 8.01 00:49.9;  Surgeon: Birder Robson, MD;  Location: [place];  Service: Ophthalmology;  Laterality: Right;   COLONOSCOPY W/ POLYPECTOMY  2009   X 2; Dr.Perry    ERCP N/A 11/05/2016   Procedure: ENDOSCOPIC RETROGRADE CHOLANGIOPANCREATOGRAPHY (ERCP);  Surgeon: Irene Shipper, MD;  Location: [facility] END[facility]COPY;  Service: Endoscopy;  Laterality: N/A;   ESOPHAGOGASTRODUODENOSCOPY (EGD) WITH PROPOFOL N/A 07/29/2016   Procedure: ESOPHAGOGASTRODUODENOSCOPY (EGD) WITH PROPOFOL;  Surgeon: Irene Shipper, MD;  Location: [facility];  Service: Endoscopy;  Laterality: N/A;  will need ERCP scope   ESOPHAGOGASTRODUODENOSCOPY (EGD) WITH PROPOFOL N/A 01/06/2018   Procedure: ESOPHAGOGASTRODUODENOSCOPY (EGD) WITH PROPOFOL;  Surgeon:  Irene Shipper, MD;  Location: [facility];  Service: Endoscopy;  Laterality: N/A;  NEED ERCP SCOPE   ESOPHAGOGASTRODUODENOSCOPY (EGD) WITH PROPOFOL N/A 01/03/2020   Procedure: ESOPHAGOGASTRODUODENOSCOPY (EGD) WITH PROPOFOL;  Surgeon: Irene Shipper, MD;  Location: [facility];  Service: Endoscopy;  Laterality: N/A;  Needs ERCP scope   HERNIA REPAIR     LUMBAR LAMINECTOMY  07/2008   UPPER GASTROINTESTINAL ENDOSCOPY      gastric polyp;Dr.Perry     VASECTOMY     WHIPPLE PROCEDURE  2012   partial ; [facility]    Family History  Problem Relation Age of Onset   Skin cancer Mother    Throat cancer Father        smoker   Esophageal cancer Father    Uterine cancer Sister    COPD Sister        X2   Ovarian cancer Sister    Throat cancer Sister        smoker   Breast cancer Sister    Lung cancer Sister        smoker   Leukemia Sister    Coronary artery disease Brother        in 28s   Lung cancer Brother        kidney cancer   Throat cancer Brother    Heart disease Brother    Heart disease Brother    Heart disease Maternal Grandfather    Stroke Paternal Grandmother 39   Colon cancer Paternal Grandfather    Heart disease Paternal Grandfather    Prostate cancer Paternal Uncle    Heart attack Paternal Uncle        3 uncles, >55   Asthma Neg Hx     Social History   Occupational History   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[employer]    Comment: Retired  Tobacco Use   Smoking status: Former    Packs/day: 2.00    Years: 50.00    Total pack years: 100.00    Types: Cigarettes    Quit date: 07/08/1997    Years since quitting: 24.5   Smokeless tobacco: Never  Vaping Use   Vaping Use: Never used  Substance and Sexual Activity   Alcohol use: No    Alcohol/week: 0.0 standard drinks of alcohol   Drug use: No   Sexual activity: Yes     Physical Exam: Blood pressure 122/70, pulse 73, height '5\' 7"'$  (1.702 m), weight 202 lb 9.6 oz (91.9 kg), SpO2 92 %.  Gen:     No acute distress Lungs:   diminished, no wheezes or crackles CV:        RRR no mrg   Data Reviewed: Imaging: I have personally reviewed the CT Chest Cardiac with limited lung windows in Feb 2021 - lung windows available are clear without nodules of effusion.   Chest xray obtained 08/21/2021 personally reviewed today - no acute process  Reviewed his CT maxillofacial from 2013 and 2017 with chronic sinusitis.   PFTs: Mild restriction to ventilation Jan  2022  Labs: Lab Results  Component Value Date   WBC 6.8 07/04/2021   HGB 15.6 07/04/2021   HCT 47.4 07/04/2021   MCV 89.9 07/04/2021   PLT 253 07/04/2021   Lab Results  Component Value Date   NA 139 08/21/2021   K 3.4 (L) 08/21/2021   CL 105 08/21/2021   CO2 26 08/21/2021     Immunization status: Immunization History  Administered Date(s) Administered   [employer]  Quad(high Dose 65+) 05/12/2019, 05/12/2020   Influenza Split 06/25/2011, 04/15/2012   Influenza Whole 05/04/2009, 06/06/2010   Influenza, High Dose Seasonal PF 05/03/2013   Influenza, Quadrivalent, Recombinant, Inj, Pf 03/28/2021   Influenza,inj,Quad PF,6+ Mos 04/07/2014, 04/03/2015, 04/19/2016, 05/02/2017   Influenza-Unspecified 05/13/2012, 04/07/2014, 04/21/2018, 04/11/2021   PFIZER(Purple Top)SARS-COV-2 Vaccination 07/30/2019, 08/20/2019   Pneumococcal Conjugate-13 04/07/2014   Pneumococcal Polysaccharide-23 05/04/2009, 04/07/2014, 05/02/2017   Td 08/08/2010   Zoster, Live 08/10/2012    Assessment:  COPD FEV1 85% of predicted with high symptom burden Chronic sinusitis - controlled History of 100 pack year smoking, quit 1999  Plan/Recommendations: continue albuterol continue breztri  continue singulair, flonase, zyrtec for rhinitis, with nasal saline. continue astelin nasal spray for ongoing rhinitis continue daliresp for now.  IgE and eos have previously not been singificantly elevated.   Return to Care: Return in about 4 months (around 05/18/2022).   Lenice Llamas, MD Pulmonary and [place]

## 2022-01-18 DIAGNOSIS — R202 Paresthesia of skin: Secondary | ICD-10-CM | POA: Diagnosis not present

## 2022-01-18 DIAGNOSIS — G8929 Other chronic pain: Secondary | ICD-10-CM | POA: Diagnosis not present

## 2022-01-18 DIAGNOSIS — M545 Low back pain, unspecified: Secondary | ICD-10-CM | POA: Diagnosis not present

## 2022-01-18 DIAGNOSIS — R200 Anesthesia of skin: Secondary | ICD-10-CM | POA: Diagnosis not present

## 2022-01-23 DIAGNOSIS — M069 Rheumatoid arthritis, unspecified: Secondary | ICD-10-CM | POA: Diagnosis not present

## 2022-02-04 DIAGNOSIS — M19072 Primary osteoarthritis, left ankle and foot: Secondary | ICD-10-CM | POA: Diagnosis not present

## 2022-02-04 DIAGNOSIS — R200 Anesthesia of skin: Secondary | ICD-10-CM | POA: Diagnosis not present

## 2022-02-04 DIAGNOSIS — R202 Paresthesia of skin: Secondary | ICD-10-CM | POA: Diagnosis not present

## 2022-02-20 DIAGNOSIS — K5900 Constipation, unspecified: Secondary | ICD-10-CM | POA: Diagnosis not present

## 2022-02-20 DIAGNOSIS — M5440 Lumbago with sciatica, unspecified side: Secondary | ICD-10-CM | POA: Diagnosis not present

## 2022-02-20 DIAGNOSIS — Z87891 Personal history of nicotine dependence: Secondary | ICD-10-CM | POA: Diagnosis not present

## 2022-02-20 DIAGNOSIS — M069 Rheumatoid arthritis, unspecified: Secondary | ICD-10-CM | POA: Diagnosis not present

## 2022-02-20 DIAGNOSIS — J449 Chronic obstructive pulmonary disease, unspecified: Secondary | ICD-10-CM | POA: Diagnosis not present

## 2022-02-20 DIAGNOSIS — J441 Chronic obstructive pulmonary disease with (acute) exacerbation: Secondary | ICD-10-CM | POA: Diagnosis not present

## 2022-02-20 DIAGNOSIS — R1319 Other dysphagia: Secondary | ICD-10-CM | POA: Diagnosis not present

## 2022-02-20 DIAGNOSIS — M48061 Spinal stenosis, lumbar region without neurogenic claudication: Secondary | ICD-10-CM | POA: Diagnosis not present

## 2022-02-20 DIAGNOSIS — Z85068 Personal history of other malignant neoplasm of small intestine: Secondary | ICD-10-CM | POA: Diagnosis not present

## 2022-02-20 DIAGNOSIS — G8929 Other chronic pain: Secondary | ICD-10-CM | POA: Diagnosis not present

## 2022-02-20 DIAGNOSIS — M0600 Rheumatoid arthritis without rheumatoid factor, unspecified site: Secondary | ICD-10-CM | POA: Diagnosis not present

## 2022-02-20 DIAGNOSIS — Z7951 Long term (current) use of inhaled steroids: Secondary | ICD-10-CM | POA: Diagnosis not present

## 2022-02-20 DIAGNOSIS — M5416 Radiculopathy, lumbar region: Secondary | ICD-10-CM | POA: Diagnosis not present

## 2022-02-20 DIAGNOSIS — K429 Umbilical hernia without obstruction or gangrene: Secondary | ICD-10-CM | POA: Diagnosis not present

## 2022-02-20 DIAGNOSIS — Z9889 Other specified postprocedural states: Secondary | ICD-10-CM | POA: Diagnosis not present

## 2022-02-20 DIAGNOSIS — E538 Deficiency of other specified B group vitamins: Secondary | ICD-10-CM | POA: Diagnosis not present

## 2022-02-20 DIAGNOSIS — I10 Essential (primary) hypertension: Secondary | ICD-10-CM | POA: Diagnosis not present

## 2022-02-20 DIAGNOSIS — I872 Venous insufficiency (chronic) (peripheral): Secondary | ICD-10-CM | POA: Diagnosis not present

## 2022-02-20 DIAGNOSIS — Z636 Dependent relative needing care at home: Secondary | ICD-10-CM | POA: Diagnosis not present

## 2022-02-21 DIAGNOSIS — M47816 Spondylosis without myelopathy or radiculopathy, lumbar region: Secondary | ICD-10-CM | POA: Diagnosis not present

## 2022-02-21 DIAGNOSIS — M5136 Other intervertebral disc degeneration, lumbar region: Secondary | ICD-10-CM | POA: Diagnosis not present

## 2022-02-21 DIAGNOSIS — M48062 Spinal stenosis, lumbar region with neurogenic claudication: Secondary | ICD-10-CM | POA: Diagnosis not present

## 2022-02-21 DIAGNOSIS — Z79899 Other long term (current) drug therapy: Secondary | ICD-10-CM | POA: Diagnosis not present

## 2022-02-21 DIAGNOSIS — M0600 Rheumatoid arthritis without rheumatoid factor, unspecified site: Secondary | ICD-10-CM | POA: Diagnosis not present

## 2022-02-21 DIAGNOSIS — M47812 Spondylosis without myelopathy or radiculopathy, cervical region: Secondary | ICD-10-CM | POA: Diagnosis not present

## 2022-02-21 DIAGNOSIS — M1990 Unspecified osteoarthritis, unspecified site: Secondary | ICD-10-CM | POA: Diagnosis not present

## 2022-02-21 DIAGNOSIS — M25569 Pain in unspecified knee: Secondary | ICD-10-CM | POA: Diagnosis not present

## 2022-02-21 DIAGNOSIS — M150 Primary generalized (osteo)arthritis: Secondary | ICD-10-CM | POA: Diagnosis not present

## 2022-02-28 DIAGNOSIS — H0012 Chalazion right lower eyelid: Secondary | ICD-10-CM | POA: Diagnosis not present

## 2022-03-14 DIAGNOSIS — Z87891 Personal history of nicotine dependence: Secondary | ICD-10-CM | POA: Diagnosis not present

## 2022-03-14 DIAGNOSIS — K648 Other hemorrhoids: Secondary | ICD-10-CM | POA: Diagnosis not present

## 2022-03-14 DIAGNOSIS — D123 Benign neoplasm of transverse colon: Secondary | ICD-10-CM | POA: Diagnosis not present

## 2022-03-14 DIAGNOSIS — K5730 Diverticulosis of large intestine without perforation or abscess without bleeding: Secondary | ICD-10-CM | POA: Diagnosis not present

## 2022-03-14 DIAGNOSIS — K635 Polyp of colon: Secondary | ICD-10-CM | POA: Diagnosis not present

## 2022-03-14 DIAGNOSIS — J439 Emphysema, unspecified: Secondary | ICD-10-CM | POA: Diagnosis not present

## 2022-03-14 DIAGNOSIS — R1030 Lower abdominal pain, unspecified: Secondary | ICD-10-CM | POA: Diagnosis not present

## 2022-03-14 DIAGNOSIS — R109 Unspecified abdominal pain: Secondary | ICD-10-CM | POA: Diagnosis not present

## 2022-03-14 LAB — HM COLONOSCOPY

## 2022-03-15 DIAGNOSIS — M47816 Spondylosis without myelopathy or radiculopathy, lumbar region: Secondary | ICD-10-CM | POA: Diagnosis not present

## 2022-03-15 DIAGNOSIS — M47812 Spondylosis without myelopathy or radiculopathy, cervical region: Secondary | ICD-10-CM | POA: Diagnosis not present

## 2022-03-15 DIAGNOSIS — M532X6 Spinal instabilities, lumbar region: Secondary | ICD-10-CM | POA: Diagnosis not present

## 2022-03-15 DIAGNOSIS — M48062 Spinal stenosis, lumbar region with neurogenic claudication: Secondary | ICD-10-CM | POA: Diagnosis not present

## 2022-03-20 DIAGNOSIS — M0579 Rheumatoid arthritis with rheumatoid factor of multiple sites without organ or systems involvement: Secondary | ICD-10-CM | POA: Diagnosis not present

## 2022-03-20 DIAGNOSIS — Z79899 Other long term (current) drug therapy: Secondary | ICD-10-CM | POA: Diagnosis not present

## 2022-03-26 DIAGNOSIS — M47812 Spondylosis without myelopathy or radiculopathy, cervical region: Secondary | ICD-10-CM | POA: Diagnosis not present

## 2022-03-27 DIAGNOSIS — N403 Nodular prostate with lower urinary tract symptoms: Secondary | ICD-10-CM | POA: Diagnosis not present

## 2022-03-27 DIAGNOSIS — R351 Nocturia: Secondary | ICD-10-CM | POA: Diagnosis not present

## 2022-03-27 DIAGNOSIS — N401 Enlarged prostate with lower urinary tract symptoms: Secondary | ICD-10-CM | POA: Diagnosis not present

## 2022-04-17 ENCOUNTER — Ambulatory Visit (INDEPENDENT_AMBULATORY_CARE_PROVIDER_SITE_OTHER): Payer: Medicare Other

## 2022-04-17 ENCOUNTER — Encounter: Payer: Self-pay | Admitting: Nurse Practitioner

## 2022-04-17 ENCOUNTER — Ambulatory Visit (INDEPENDENT_AMBULATORY_CARE_PROVIDER_SITE_OTHER): Payer: Medicare Other | Admitting: Nurse Practitioner

## 2022-04-17 VITALS — BP 132/62 | HR 77 | Temp 98.3°F | Ht 67.0 in | Wt 194.4 lb

## 2022-04-17 DIAGNOSIS — J441 Chronic obstructive pulmonary disease with (acute) exacerbation: Secondary | ICD-10-CM

## 2022-04-17 DIAGNOSIS — M0579 Rheumatoid arthritis with rheumatoid factor of multiple sites without organ or systems involvement: Secondary | ICD-10-CM | POA: Diagnosis not present

## 2022-04-17 DIAGNOSIS — J209 Acute bronchitis, unspecified: Secondary | ICD-10-CM

## 2022-04-17 DIAGNOSIS — R059 Cough, unspecified: Secondary | ICD-10-CM | POA: Diagnosis not present

## 2022-04-17 DIAGNOSIS — J069 Acute upper respiratory infection, unspecified: Secondary | ICD-10-CM

## 2022-04-17 DIAGNOSIS — J440 Chronic obstructive pulmonary disease with acute lower respiratory infection: Secondary | ICD-10-CM

## 2022-04-17 MED ORDER — BENZONATATE 200 MG PO CAPS: 200.0000 mg | ORAL_CAPSULE | Freq: Three times a day (TID) | ORAL | 1 refills | Status: DC | PRN

## 2022-04-17 MED ORDER — AZITHROMYCIN 250 MG PO TABS: ORAL_TABLET | ORAL | 0 refills | Status: DC

## 2022-04-17 MED ORDER — ROFLUMILAST 500 MCG PO TABS: 500.0000 ug | ORAL_TABLET | Freq: Every day | ORAL | 3 refills | Status: DC

## 2022-04-17 MED ORDER — PREDNISONE 10 MG PO TABS: ORAL_TABLET | ORAL | 0 refills | Status: DC

## 2022-04-17 NOTE — Patient Instructions (Addendum)
-  Continue Breztri 2 puffs twice daily.  Brush tongue and rinse mouth well afterwards. -Continue Albuterol inhaler 2 puffs or 3 mL neb every 6 hours as needed for shortness of breath or wheezing. Notify if symptoms persist despite rescue inhaler/neb use. -Continue Astelin nasal spray 1 spray each nostril twice daily -Continue Zyrtec 10 mg daily -Continue Singulair 10 mg nightly -Continue Flonase 2 sprays each nostril daily as needed for allergies -Continue omeprazole 20 mg daily -Continue Daliresp 500 mcg daily -Continue prednisone 5 mg daily as directed by rheumatology -Continue mucinex 600 mg Twice daily   -Start prednisone taper. 4 tabs for 2 days, then 3 tabs for 2 days, 2 tabs for 2 days, then 1 tab for 2 days, then return to daily 5 mg dose. Take in AM with food -Azithromycin - take 2 tabs on day one then 1 tab daily for four additional days -Tessalon perles (benzonatate) 200 mg Three times a day as needed for cough  Chest x ray today  Needs to be seen back Tuesday prior to your surgery. If symptoms do not improve or worsen, please contact office for sooner follow up or seek emergency care.

## 2022-04-17 NOTE — Assessment & Plan Note (Signed)
See above.  Continue nasal sprays as ordered.  Supportive care measures advised.

## 2022-04-17 NOTE — Progress Notes (Addendum)
$'@Patient'J$  ID: Thomas Hudson, male    DOB: 1940-11-20, 81 y.o.   MRN: [id number]  No chief complaint on file.   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  He is a patient of Dr. Mauricio Po and was last seen in office on 01/15/2022.  Past medical history significant for hypertension, venous insufficiency, GERD, BPH, IDA, rheumatoid arthritis.   TEST/EVENTS:  05/20/2016 CT sinuses: Mild mucosal edema throughout the paranasal sinuses.   09/03/2019 CT chest cardiac: Visualized lungs without evidence of acute process or nodules. 07/31/2020 PFT: FVC 73, FEV1 85, ratio 87, TLC 74, DLCOcor 74 02/08/2021 CXR 2 view: Low lung volumes with mild bibasilar atelectasis; otherwise clear.  Cardiomegaly.  01/15/2022: OV with [name] for follow-up.  Doing well.  No COPD flares since last appointment in March.  Otherwise usually has 3 to 4-year requiring prednisone.  On Breztri and Daliresp.  Does have a significant allergic component to his flare, especially in the fall.  Previously reviewed blood work and IgE and eos are not significantly elevated.  He is on baseline 5 mg prednisone for his RA.  Taking infusion therapy for his RA at [facility] with Dr. Dossie Der.  Had cataract surgery since last visit.  Vision has improved.  No changes made to regimen.  Follow-up 4 months.  04/17/2022: Today-acute Patient presents today with his daughter and wife for acute visit.  He tells me that a little over a week ago, he started coughing more.  Describes his cough as productive with brown sputum.  He has also had some more chest congestion and wheezing.  Slight increase in shortness of breath, mainly with coughing spells.  He does feel like he has some more nasal congestion than he normally does.  Denies any sick exposures, headaches, fevers, night sweats, chills, hemoptysis.  He is currently on  Breztri and Daliresp for management of his COPD.  Uses albuterol occasionally.  He is on daily prednisone 5 mg and Cimzia injections for his RA.  He is also on daily doxycycline for acne.  He is supposed to have a T3-T5 minimally invasive fusion next week with Dr. Mechele Dawley.  Wondering if he is okay to move forward with this.  Allergies  Allergen Reactions   Morphine And Related Other (See Comments)    Itching & rash Because of a history of documented adverse serious drug reaction;Medi Alert bracelet  is recommended    Immunization History  Administered Date(s) Administered   Fluad Quad(high Dose 65+) 05/12/2019, 05/12/2020   Influenza Split 06/25/2011, 04/15/2012   Influenza Whole 05/04/2009, 06/06/2010   Influenza, High Dose Seasonal PF 05/03/2013   Influenza, Quadrivalent, Recombinant, Inj, Pf 03/28/2021   Influenza,inj,Quad PF,6+ Mos 04/07/2014, 04/03/2015, 04/19/2016, 05/02/2017   Influenza,inj,Quad PF,6-35 Mos 05/13/2012, 05/31/2013, 04/07/2014   Influenza-Unspecified 06/25/2011, 04/15/2012, 05/13/2012, 04/07/2014, 04/21/2018, 04/11/2021   PFIZER Comirnaty(Gray Top)Covid-19 Tri-Sucrose Vaccine 07/30/2019, 08/20/2019   PFIZER(Purple Top)SARS-COV-2 Vaccination 07/30/2019, 08/20/2019   Pneumococcal Conjugate-13 04/07/2014   Pneumococcal Polysaccharide-23 05/04/2009, 04/07/2014, 05/02/2017   Td 08/08/2010   Td (Adult),5 Lf Tetanus Toxid, Preservative Free 08/08/2010   Zoster, Live 08/10/2012    Past Medical History:  Diagnosis Date   BPH (benign prostatic hyperplasia)    Carpal tunnel syndrome on both sides    Complication of anesthesia    Low BP and O2 sats after EGD   COPD (chronic obstructive pulmonary disease) ([place])  GERD (gastroesophageal reflux disease)    Hyperlipidemia    Hypertension    RAD (reactive airway disease)    no PMH of asthma; RAD post infection   Rheumatoid arthritis ([place])         Tobacco History: Social History   Tobacco Use  Smoking Status Former    Packs/day: 2.00   Years: 50.00   Total pack years: 100.00   Types: Cigarettes   Quit date: 07/08/1997   Years since quitting: 24.7  Smokeless Tobacco Never   Counseling given: Not Answered   Outpatient Medications Prior to Visit  Medication Sig Dispense Refill   albuterol (VENTOLIN HFA) 108 (90 Base) MCG/ACT inhaler Inhale 2 puffs into the lungs every 6 (six) hours as needed for wheezing or shortness of breath. 3 each 4   alfuzosin (UROXATRAL) 10 MG 24 hr tablet TAKE 1 TABLET(10 MG) BY MOUTH DAILY 90 tablet 3   amLODipine (NORVASC) 5 MG tablet Take 1 tablet (5 mg total) by mouth daily. 90 tablet 3   aspirin-acetaminophen-caffeine (EXCEDRIN MIGRAINE) 250-250-65 MG tablet Take 2 tablets by mouth 3 (three) times daily as needed for headache.     azelastine (ASTELIN) 0.1 % nasal spray Place 1 spray into both nostrils 2 (two) times daily. Use in each nostril as directed 90 mL 3   betamethasone, augmented, (DIPROLENE) 0.05 % lotion Apply 1 application. topically daily.     BREZTRI AEROSPHERE 160-9-4.8 MCG/ACT AERO Inhale 2 puffs into the lungs 2 (two) times daily. 28.4 g 6   Certolizumab Pegol (CIMZIA) 2 X 200 MG KIT Inject 2 each into the skin every 30 (thirty) days.     cetirizine (ZYRTEC) 10 MG tablet Take 1 tablet (10 mg total) by mouth at bedtime. 90 tablet 3   doxycycline (MONODOX) 50 MG capsule Take 50 mg by mouth daily.     erythromycin ophthalmic ointment Place 1 application into both eyes daily as needed (dry eyes).     ferrous sulfate 325 (65 FE) MG tablet Take 1 tablet (325 mg total) by mouth 3 (three) times daily with meals. 270 tablet 3   finasteride (PROSCAR) 5 MG tablet Take 1 tablet (5 mg total) by mouth daily. 90 tablet 3   fluticasone (FLONASE) 50 MCG/ACT nasal spray Place 2 sprays into both nostrils daily as needed for allergies. 48 g 3   folic acid (FOLVITE) 1 MG tablet Take 1 mg by mouth daily.   3   gabapentin (NEURONTIN) 300 MG capsule Take 300-600 mg by mouth See admin  instructions. Take 1 capsule (300 mg) by mouth in the morning & take 2 capsules (600 mg) by mouth at night.     losartan (COZAAR) 100 MG tablet Take 1 tablet (100 mg total) by mouth daily. 90 tablet 3   montelukast (SINGULAIR) 10 MG tablet Take 1 tablet (10 mg total) by mouth at bedtime. 90 tablet 3   Multiple Vitamin (MULTIVITAMIN WITH MINERALS) TABS tablet Take 1 tablet by mouth daily.     omeprazole (PRILOSEC) 20 MG capsule Take 1 capsule (20 mg total) by mouth daily. 90 capsule 3   potassium chloride SA (KLOR-CON M) 20 MEQ tablet Take 1 tablet (20 mEq total) by mouth daily. 90 tablet 3   predniSONE (DELTASONE) 5 MG tablet Take 5 mg by mouth See admin instructions. Take 1 tablet daily     rosuvastatin (CRESTOR) 10 MG tablet Take 10 mg by mouth daily.     benzonatate (TESSALON) 200 MG capsule Take 1 capsule (  200 mg total) by mouth 3 (three) times daily as needed for cough. 30 capsule 0   roflumilast (DALIRESP) 500 MCG TABS tablet Take 1 tablet (500 mcg total) by mouth daily. 90 tablet 3   albuterol (PROVENTIL) (2.5 MG/3ML) 0.083% nebulizer solution Take 3 mLs (2.5 mg total) by nebulization every 6 (six) hours as needed for wheezing or shortness of breath. 360 mL 3   No facility-administered medications prior to visit.     Review of Systems:   Constitutional: No weight loss or gain, night sweats, fevers, chills, fatigue, or lassitude. HEENT: No headaches, difficulty swallowing, tooth/dental problems, or sore throat. No sneezing, itching, ear ache, nasal congestion, or post nasal drip CV:  No chest pain, orthopnea, extremity swelling, PND, anasarca, dizziness, palpitations, syncope Resp: +shortness of breath with exertion/coughing spells; productive cough; occasional wheeze. No hemoptysis. No chest wall deformity GI:  No heartburn, indigestion, abdominal pain, nausea, vomiting, diarrhea, change in bowel habits, loss of appetite, bloody stools.  Skin: No rash, lesions, ulcerations Neuro: No  dizziness or lightheadedness.  Psych: No depression or anxiety. Mood stable.     Physical Exam:  BP 132/62 (BP Location: Right Arm, Patient Position: Sitting, Cuff Size: Normal)   Pulse 77   Temp 98.3 F (36.8 C) (Oral)   Ht $R'5\' 7"'xD$  (1.702 m)   Wt 194 lb 6.4 oz (88.2 kg)   SpO2 98%   BMI 30.45 kg/m   GEN: Pleasant, interactive, well-appearing; elderly; obese; in no acute distress HEENT:  Normocephalic and atraumatic. PERRLA. Sclera white. Nasal turbinates pink, moist and patent bilaterally. Clear rhinorrhea present. Oropharynx pink and moist, without exudate or edema. No lesions, ulcerations, or postnasal drip.  NECK:  Supple w/ fair ROM. No JVD present. Normal carotid impulses w/o bruits. Thyroid symmetrical with no goiter or nodules palpated. No lymphadenopathy.   CV: RRR, no m/r/g, no peripheral edema. Pulses intact, +2 bilaterally. No cyanosis, pallor or clubbing. PULMONARY:  Unlabored, regular breathing. Scattered rhonchi bilaterally A&P. Bronchitic cough. No accessory muscle use. No dullness to percussion. GI: BS present and normoactive. Soft, non-tender to palpation. No organomegaly or masses detected.  MSK: No erythema, warmth or tenderness. No deformities or joint swelling noted.  Neuro: A/Ox3. No focal deficits noted.   Skin: Warm, no lesions or rashe Psych: Normal affect and behavior. Judgement and thought content appropriate.     Lab Results:  CBC    Component Value Date/Time   WBC 6.8 07/04/2021 1117   RBC 5.27 07/04/2021 1117   HGB 15.6 07/04/2021 1117   HGB 13.9 02/20/2017 0951   HCT 47.4 07/04/2021 1117   HCT 41.5 02/20/2017 0951   PLT 253 07/04/2021 1117   PLT 222 02/20/2017 0951   MCV 89.9 07/04/2021 1117   MCV 90.4 02/20/2017 0951   MCH 29.6 07/04/2021 1117   MCHC 32.9 07/04/2021 1117   RDW 12.8 07/04/2021 1117   RDW 14.7 (H) 02/20/2017 0951   LYMPHSABS 0.5 (L) 11/21/2020 1557   LYMPHSABS 0.7 (L) 02/20/2017 0951   MONOABS 0.6 11/21/2020 1557    MONOABS 0.5 02/20/2017 0951   EOSABS 0.1 11/21/2020 1557   EOSABS 0.2 02/20/2017 0951   BASOSABS 0.0 11/21/2020 1557   BASOSABS 0.0 02/20/2017 0951    BMET    Component Value Date/Time   NA 139 08/21/2021 1042   NA 141 08/06/2019 1356   K 3.4 (L) 08/21/2021 1042   CL 105 08/21/2021 1042   CO2 26 08/21/2021 1042   GLUCOSE 180 (H) 08/21/2021 1042  BUN 10 08/21/2021 1042   BUN 12 08/06/2019 1356   CREATININE 1.23 08/21/2021 1042   CALCIUM 8.6 08/21/2021 1042   GFRNONAA 59 (L) 07/04/2021 1117   GFRAA 73 08/06/2019 1356    BNP No results found for: "BNP"   Imaging:  DG Chest 2 View  Result Date: 04/17/2022 CLINICAL DATA:  Productive cough. EXAM: CHEST - 2 VIEW COMPARISON:  Chest two views 08/21/2021, 12/12/2020 FINDINGS: Cardiac silhouette and mediastinal contours are within normal limits. The lungs are clear. No pleural effusion or pneumothorax. Mild multilevel disc space narrowing of the thoracic spine. IMPRESSION: No active cardiopulmonary disease. Electronically Signed   By: Yvonne Kendall M.D.   On: 04/17/2022 16:45         Latest Ref Rng & Units 07/31/2020   12:51 PM  PFT Results  FVC-Pre L 2.70   FVC-Predicted Pre % 73   FVC-Post L 2.56   FVC-Predicted Post % 69   Pre FEV1/FVC % % 83   Post FEV1/FCV % % 87   FEV1-Pre L 2.24   FEV1-Predicted Pre % 85   FEV1-Post L 2.24   DLCO uncorrected ml/min/mmHg 16.91   DLCO UNC% % 74   DLCO corrected ml/min/mmHg 16.91   DLCO COR %Predicted % 74   DLVA Predicted % 123   TLC L 4.78   TLC % Predicted % 74   RV % Predicted % 83     Lab Results  Component Value Date   NITRICOXIDE 9 05/28/2016        Assessment & Plan:   Acute bronchitis with COPD ([place]) Acute bronchitis; mild AECOPD.  Possibly related to viral URI.  Given length of symptoms, no indication for COVID or flu testing.  No evidence of superimposed infection on CXR.  Nontoxic-appearing and vital signs stable.  He does have some mild bronchospasm and  bronchitic cough on exam.  We will treat him with prednisone taper and Z-Pak.  Instructed him that he will need to return for surgical clearance prior to his procedure next week.  Strict return/ED precautions.  Patient Instructions  -Continue Breztri 2 puffs twice daily.  Brush tongue and rinse mouth well afterwards. -Continue Albuterol inhaler 2 puffs or 3 mL neb every 6 hours as needed for shortness of breath or wheezing. Notify if symptoms persist despite rescue inhaler/neb use. -Continue Astelin nasal spray 1 spray each nostril twice daily -Continue Zyrtec 10 mg daily -Continue Singulair 10 mg nightly -Continue Flonase 2 sprays each nostril daily as needed for allergies -Continue omeprazole 20 mg daily -Continue Daliresp 500 mcg daily -Continue prednisone 5 mg daily as directed by rheumatology -Continue mucinex 600 mg Twice daily   -Start prednisone taper. 4 tabs for 2 days, then 3 tabs for 2 days, 2 tabs for 2 days, then 1 tab for 2 days, then return to daily 5 mg dose. Take in AM with food -Azithromycin - take 2 tabs on day one then 1 tab daily for four additional days -Tessalon perles (benzonatate) 200 mg Three times a day as needed for cough  Chest x ray today  Needs to be seen back Tuesday prior to your surgery. If symptoms do not improve or worsen, please contact office for sooner follow up or seek emergency care.    URI (upper respiratory infection) See above.  Continue nasal sprays as ordered.  Supportive care measures advised.     Clayton Bibles, NP 04/17/2022  Pt aware and understands NP's role.

## 2022-04-17 NOTE — Assessment & Plan Note (Signed)
Acute bronchitis; mild AECOPD.  Possibly related to viral URI.  Given length of symptoms, no indication for COVID or flu testing.  No evidence of superimposed infection on CXR.  Nontoxic-appearing and vital signs stable.  He does have some mild bronchospasm and bronchitic cough on exam.  We will treat him with prednisone taper and Z-Pak.  Instructed him that he will need to return for surgical clearance prior to his procedure next week.  Strict return/ED precautions.  Patient Instructions  -Continue Breztri 2 puffs twice daily.  Brush tongue and rinse mouth well afterwards. -Continue Albuterol inhaler 2 puffs or 3 mL neb every 6 hours as needed for shortness of breath or wheezing. Notify if symptoms persist despite rescue inhaler/neb use. -Continue Astelin nasal spray 1 spray each nostril twice daily -Continue Zyrtec 10 mg daily -Continue Singulair 10 mg nightly -Continue Flonase 2 sprays each nostril daily as needed for allergies -Continue omeprazole 20 mg daily -Continue Daliresp 500 mcg daily -Continue prednisone 5 mg daily as directed by rheumatology -Continue mucinex 600 mg Twice daily   -Start prednisone taper. 4 tabs for 2 days, then 3 tabs for 2 days, 2 tabs for 2 days, then 1 tab for 2 days, then return to daily 5 mg dose. Take in AM with food -Azithromycin - take 2 tabs on day one then 1 tab daily for four additional days -Tessalon perles (benzonatate) 200 mg Three times a day as needed for cough  Chest x ray today  Needs to be seen back Tuesday prior to your surgery. If symptoms do not improve or worsen, please contact office for sooner follow up or seek emergency care.

## 2022-04-18 ENCOUNTER — Telehealth: Payer: Self-pay | Admitting: Nurse Practitioner

## 2022-04-18 ENCOUNTER — Other Ambulatory Visit: Payer: Self-pay | Admitting: Internal Medicine

## 2022-04-18 NOTE — Telephone Encounter (Signed)
Pt's OV has been printed to be faxed to [facility] w/ [facility].

## 2022-04-22 ENCOUNTER — Ambulatory Visit (INDEPENDENT_AMBULATORY_CARE_PROVIDER_SITE_OTHER): Payer: Medicare Other | Admitting: Nurse Practitioner

## 2022-04-22 ENCOUNTER — Encounter: Payer: Self-pay | Admitting: Nurse Practitioner

## 2022-04-22 DIAGNOSIS — Z01811 Encounter for preprocedural respiratory examination: Secondary | ICD-10-CM

## 2022-04-22 DIAGNOSIS — J439 Emphysema, unspecified: Secondary | ICD-10-CM

## 2022-04-22 DIAGNOSIS — J4489 Other specified chronic obstructive pulmonary disease: Secondary | ICD-10-CM | POA: Diagnosis not present

## 2022-04-22 MED ORDER — FOLIC ACID 1 MG PO TABS: 1.0000 mg | ORAL_TABLET | Freq: Every day | ORAL | 3 refills | Status: DC

## 2022-04-22 MED ORDER — PREDNISONE 5 MG PO TABS: 5.0000 mg | ORAL_TABLET | ORAL | 3 refills | Status: DC

## 2022-04-22 NOTE — Progress Notes (Signed)
'@Patient'  ID: Thomas Hudson, male    DOB: [birth year], 81 y.o.   MRN: [id number]  No chief complaint on file.   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  He is a patient of Dr. Mauricio Po and was last seen in office on 01/15/2022.  Past medical history significant for hypertension, venous insufficiency, GERD, BPH, IDA, rheumatoid arthritis.   TEST/EVENTS:  05/20/2016 CT sinuses: Mild mucosal edema throughout the paranasal sinuses.   09/03/2019 CT chest cardiac: Visualized lungs without evidence of acute process or nodules. 07/31/2020 PFT: FVC 73, FEV1 85, ratio 87, TLC 74, DLCOcor 74 02/08/2021 CXR 2 view: Low lung volumes with mild bibasilar atelectasis; otherwise clear.  Cardiomegaly. 04/17/2022 CXR 2 view: the lungs are clear   01/15/2022: OV with [name] for follow-up.  Doing well.  No COPD flares since last appointment in March.  Otherwise usually has 3 to 4-year requiring prednisone.  On Breztri and Daliresp.  Does have a significant allergic component to his flare, especially in the fall.  Previously reviewed blood work and IgE and eos are not significantly elevated.  He is on baseline 5 mg prednisone for his RA.  Taking infusion therapy for his RA at [facility] with Dr. Dossie Der.  Had cataract surgery since last visit.  Vision has improved.  No changes made to regimen.  Follow-up 4 months.  04/17/2022: OV with Lexxie Winberg NP for acute visit.  He tells me that a little over a week ago, he started coughing more.  Describes his cough as productive with brown sputum.  He has also had some more chest congestion and wheezing.  Slight increase in shortness of breath, mainly with coughing spells.  He does feel like he has some more nasal congestion than he normally does.  Denies any sick exposures, headaches, fevers, night sweats, chills.  He is currently on Breztri and  Daliresp for management of his COPD.  Uses albuterol occasionally.  He is on daily prednisone 5 mg and Cimzia injections for his RA.  He is also on daily doxycycline for acne.  He is supposed to have a T3-T5 minimally invasive fusion next week with Dr. Mechele Dawley.  Wondering if he is okay to move forward with this. Treated for AECOPD with z pack and prednisone. Advised he would need to postpone surgery if symptoms did not improve.  04/22/2022: Today - follow up Patient presents today for follow-up after being treated for AECOPD with Z-Pak and prednisone taper.  He is feeling much better today.  Feels like his chest congestion has improved and his cough has mostly resolved.  He still occasionally gets a throat tickle but otherwise it is cleared up and he is no longer producing large amounts of sputum.  He also has not noticed any more wheezing.  Feels like his breathing is back to his baseline for the most part.  Nasal congestion did improve with the Flonase nasal spray.  Denies any fevers, chills, night sweats, hemoptysis, orthopnea.  He is using Dance movement psychotherapist for management of his COPD.  Finishing up prednisone taper; will turn to his baseline 5 mg daily for his RA.  He is here today to hopefully be cleared for surgery prior to T3-T5 minimally invasive fusion scheduled for Wednesday, 10/18, with Dr. Mechele Dawley.  Allergies  Allergen Reactions   Morphine And Related Other (See Comments)    Itching & rash  Because of a history of documented adverse serious drug reaction;Medi Alert bracelet  is recommended    Immunization History  Administered Date(s) Administered   Fluad Quad(high Dose 65+) 05/12/2019, 05/12/2020   Influenza Split 06/25/2011, 04/15/2012   Influenza Whole 05/04/2009, 06/06/2010   Influenza, High Dose Seasonal PF 05/03/2013   Influenza, Quadrivalent, Recombinant, Inj, Pf 03/28/2021   Influenza,inj,Quad PF,6+ Mos 04/07/2014, 04/03/2015, 04/19/2016, 05/02/2017   Influenza,inj,Quad PF,6-35  Mos 05/13/2012, 05/31/2013, 04/07/2014   Influenza-Unspecified 06/25/2011, 04/15/2012, 05/13/2012, 04/07/2014, 04/21/2018, 04/11/2021   PFIZER Comirnaty(Gray Top)Covid-19 Tri-Sucrose Vaccine 07/30/2019, 08/20/2019   PFIZER(Purple Top)SARS-COV-2 Vaccination 07/30/2019, 08/20/2019   Pneumococcal Conjugate-13 04/07/2014   Pneumococcal Polysaccharide-23 05/04/2009, 04/07/2014, 05/02/2017   Td 08/08/2010   Td (Adult),5 Lf Tetanus Toxid, Preservative Free 08/08/2010   Unspecified SARS-COV-2 Vaccination 07/30/2019, 08/20/2019   Zoster, Live 08/10/2012    Past Medical History:  Diagnosis Date   BPH (benign prostatic hyperplasia)    Carpal tunnel syndrome on both sides    Complication of anesthesia    Low BP and O2 sats after EGD   COPD (chronic obstructive pulmonary disease) (HCC)    GERD (gastroesophageal reflux disease)    Hyperlipidemia    Hypertension    RAD (reactive airway disease)    no PMH of asthma; RAD post infection   Rheumatoid arthritis ([place])         Tobacco History: Social History   Tobacco Use  Smoking Status Former   Packs/day: 2.00   Years: 50.00   Total pack years: 100.00   Types: Cigarettes   Quit date: 07/08/1997   Years since quitting: 24.8  Smokeless Tobacco Never   Counseling given: Not Answered   Outpatient Medications Prior to Visit  Medication Sig Dispense Refill   albuterol (VENTOLIN HFA) 108 (90 Base) MCG/ACT inhaler Inhale 2 puffs into the lungs every 6 (six) hours as needed for wheezing or shortness of breath. 3 each 4   alfuzosin (UROXATRAL) 10 MG 24 hr tablet TAKE 1 TABLET(10 MG) BY MOUTH DAILY 90 tablet 3   amLODipine (NORVASC) 5 MG tablet Take 1 tablet (5 mg total) by mouth daily. 90 tablet 3   aspirin-acetaminophen-caffeine (EXCEDRIN MIGRAINE) 250-250-65 MG tablet Take 2 tablets by mouth 3 (three) times daily as needed for headache.     azelastine (ASTELIN) 0.1 % nasal spray Place 1 spray into both nostrils 2 (two) times daily. Use in each  nostril as directed 90 mL 3   azithromycin (ZITHROMAX) 250 MG tablet Take 2 tabs (500 mg) on day one then take 1 tab daily for four additional days. 6 tablet 0   benzonatate (TESSALON) 200 MG capsule Take 1 capsule (200 mg total) by mouth 3 (three) times daily as needed for cough. 30 capsule 1   betamethasone, augmented, (DIPROLENE) 0.05 % lotion Apply 1 application. topically daily.     BREZTRI AEROSPHERE 160-9-4.8 MCG/ACT AERO Inhale 2 puffs into the lungs 2 (two) times daily. 76.7 g 6   Certolizumab Pegol (CIMZIA) 2 X 200 MG KIT Inject 2 each into the skin every 30 (thirty) days.     cetirizine (ZYRTEC) 10 MG tablet Take 1 tablet (10 mg total) by mouth at bedtime. 90 tablet 3   doxycycline (MONODOX) 50 MG capsule Take 50 mg by mouth daily.     erythromycin ophthalmic ointment Place 1 application into both eyes daily as needed (dry eyes).     ferrous sulfate 325 (65 FE) MG tablet Take 1 tablet (325 mg total) by mouth 3 (three) times  daily with meals. 270 tablet 3   finasteride (PROSCAR) 5 MG tablet Take 1 tablet (5 mg total) by mouth daily. 90 tablet 3   fluticasone (FLONASE) 50 MCG/ACT nasal spray Place 2 sprays into both nostrils daily as needed for allergies. 48 g 3   folic acid (FOLVITE) 1 MG tablet Take 1 mg by mouth daily.   3   gabapentin (NEURONTIN) 300 MG capsule Take 300-600 mg by mouth See admin instructions. Take 1 capsule (300 mg) by mouth in the morning & take 2 capsules (600 mg) by mouth at night.     losartan (COZAAR) 100 MG tablet Take 1 tablet (100 mg total) by mouth daily. 90 tablet 3   montelukast (SINGULAIR) 10 MG tablet Take 1 tablet (10 mg total) by mouth at bedtime. 90 tablet 3   Multiple Vitamin (MULTIVITAMIN WITH MINERALS) TABS tablet Take 1 tablet by mouth daily.     omeprazole (PRILOSEC) 20 MG capsule Take 1 capsule (20 mg total) by mouth daily. 90 capsule 3   potassium chloride SA (KLOR-CON M) 20 MEQ tablet Take 1 tablet (20 mEq total) by mouth daily. 90 tablet 3    predniSONE (DELTASONE) 10 MG tablet 4 tabs for 2 days, then 3 tabs for 2 days, 2 tabs for 2 days, then 1 tab for 2 days, then restart 5 mg daily 20 tablet 0   predniSONE (DELTASONE) 5 MG tablet Take 5 mg by mouth See admin instructions. Take 1 tablet daily     roflumilast (DALIRESP) 500 MCG TABS tablet Take 1 tablet (500 mcg total) by mouth daily. 90 tablet 3   rosuvastatin (CRESTOR) 10 MG tablet Take 10 mg by mouth daily.     albuterol (PROVENTIL) (2.5 MG/3ML) 0.083% nebulizer solution Take 3 mLs (2.5 mg total) by nebulization every 6 (six) hours as needed for wheezing or shortness of breath. 360 mL 3   No facility-administered medications prior to visit.     Review of Systems:   Constitutional: No weight loss or gain, night sweats, fevers, chills, fatigue, or lassitude. HEENT: No headaches, difficulty swallowing, tooth/dental problems, or sore throat. No sneezing, itching, ear ache, nasal congestion, or post nasal drip CV:  No chest pain, orthopnea, extremity swelling, PND, anasarca, dizziness, palpitations, syncope Resp: +shortness of breath (baseline); non productive minimal dry cough (improved). No chest congestion. No wheezing. No hemoptysis. No chest wall deformity GI:  No heartburn, indigestion, abdominal pain, nausea, vomiting, diarrhea, change in bowel habits, loss of appetite, bloody stools.  Skin: No rash, lesions, ulcerations Neuro: No dizziness or lightheadedness.  Psych: No depression or anxiety. Mood stable.     Physical Exam:  BP (!) 134/58 (BP Location: Right Arm, Patient Position: Sitting, Cuff Size: Normal)   Pulse 85   Temp 97.9 F (36.6 C) (Oral)   Ht '5\' 7"'  (1.702 m)   Wt 194 lb 6.4 oz (88.2 kg)   SpO2 98%   BMI 30.45 kg/m   GEN: Pleasant, interactive, well-appearing; elderly; obese; in no acute distress HEENT:  Normocephalic and atraumatic. PERRLA. Sclera white. Nasal turbinates pink, moist and patent bilaterally. No rhinorrhea present. Oropharynx pink and  moist, without exudate or edema. No lesions, ulcerations, or postnasal drip.  NECK:  Supple w/ fair ROM. No JVD present. Normal carotid impulses w/o bruits. Thyroid symmetrical with no goiter or nodules palpated. No lymphadenopathy.   CV: RRR, no m/r/g, no peripheral edema. Pulses intact, +2 bilaterally. No cyanosis, pallor or clubbing. PULMONARY:  Unlabored, regular breathing. Clear A&P  w/o wheezes/rales/rhonchi. No accessory muscle use. No dullness to percussion. GI: BS present and normoactive. Soft, non-tender to palpation. No organomegaly or masses detected.  MSK: No erythema, warmth or tenderness. No deformities or joint swelling noted.  Neuro: A/Ox3. No focal deficits noted.   Skin: Warm, no lesions or rashe Psych: Normal affect and behavior. Judgement and thought content appropriate.     Lab Results:  CBC    Component Value Date/Time   WBC 6.8 07/04/2021 1117   RBC 5.27 07/04/2021 1117   HGB 15.6 07/04/2021 1117   HGB 13.9 02/20/2017 0951   HCT 47.4 07/04/2021 1117   HCT 41.5 02/20/2017 0951   PLT 253 07/04/2021 1117   PLT 222 02/20/2017 0951   MCV 89.9 07/04/2021 1117   MCV 90.4 02/20/2017 0951   MCH 29.6 07/04/2021 1117   MCHC 32.9 07/04/2021 1117   RDW 12.8 07/04/2021 1117   RDW 14.7 (H) 02/20/2017 0951   LYMPHSABS 0.5 (L) 11/21/2020 1557   LYMPHSABS 0.7 (L) 02/20/2017 0951   MONOABS 0.6 11/21/2020 1557   MONOABS 0.5 02/20/2017 0951   EOSABS 0.1 11/21/2020 1557   EOSABS 0.2 02/20/2017 0951   BASOSABS 0.0 11/21/2020 1557   BASOSABS 0.0 02/20/2017 0951    BMET    Component Value Date/Time   [place] 139 08/21/2021 1042   [place] 141 08/06/2019 1356   K 3.4 (L) 08/21/2021 1042   CL 105 08/21/2021 1042   CO2 26 08/21/2021 1042   GLUCOSE 180 (H) 08/21/2021 1042   BUN 10 08/21/2021 1042   BUN 12 08/06/2019 1356   CREATININE 1.23 08/21/2021 1042   CALCIUM 8.6 08/21/2021 1042   GFRNONAA 59 (L) 07/04/2021 1117   GFRAA 73 08/06/2019 1356    BNP No results found for:  "BNP"   Imaging:  DG Chest 2 View  Result Date: 04/17/2022 CLINICAL DATA:  Productive cough. EXAM: CHEST - 2 VIEW COMPARISON:  Chest two views 08/21/2021, 12/12/2020 FINDINGS: Cardiac silhouette and mediastinal contours are within normal limits. The lungs are clear. No pleural effusion or pneumothorax. Mild multilevel disc space narrowing of the thoracic spine. IMPRESSION: No active cardiopulmonary disease. Electronically Signed   By: Yvonne Kendall M.D.   On: 04/17/2022 16:45         Latest Ref Rng & Units 07/31/2020   12:51 PM  PFT Results  FVC-Pre L 2.70   FVC-Predicted Pre % 73   FVC-Post L 2.56   FVC-Predicted Post % 69   Pre FEV1/FVC % % 83   Post FEV1/FCV % % 87   FEV1-Pre L 2.24   FEV1-Predicted Pre % 85   FEV1-Post L 2.24   DLCO uncorrected ml/min/mmHg 16.91   DLCO UNC% % 74   DLCO corrected ml/min/mmHg 16.91   DLCO COR %Predicted % 74   DLVA Predicted % 123   TLC L 4.78   TLC % Predicted % 74   RV % Predicted % 83     Lab Results  Component Value Date   NITRICOXIDE 9 05/28/2016        Assessment & Plan:   COPD with chronic bronchitis and emphysema ([place]) Resolving AECOPD related to URI. He is clinically improved today. Previous CXR clear. He has a few days left on his prednisone taper. Discussed that he would be a moderately high risk for surgery given his recent exacerbation; he did not want to have to postpone for months. Encouraged him on postoperative measures to avoid pulmonary complications. Continue on Breztri and Daliresp.  Patient Instructions  -Continue Breztri 2 puffs twice daily.  Brush tongue and rinse mouth well afterwards. -Continue Albuterol inhaler 2 puffs or 3 mL neb every 6 hours as needed for shortness of breath or wheezing. Notify if symptoms persist despite rescue inhaler/neb use. -Continue Astelin nasal spray 1 spray each nostril twice daily -Continue Zyrtec 10 mg daily -Continue Singulair 10 mg nightly -Continue Flonase 2 sprays  each nostril daily as needed for allergies -Continue omeprazole 20 mg daily -Continue Daliresp 500 mcg daily -Continue prednisone 5 mg daily as directed by rheumatology -Continue mucinex 600 mg Twice daily  -Complete prednisone taper -Continue Tessalon perles (benzonatate) 200 mg Three times a day as needed for cough  Take your inhalers with you to surgery. Make sure you use your inhaler the morning of surgery. Out of bed as soon as possible after surgery to move around. Use incentive spirometry every hour; 10 times an hour while awake.   Follow up in 6-8 weeks with [name]. If symptoms do not improve or worsen, please contact office for sooner follow up or seek emergency care.     Preoperative respiratory examination Factors that increase the risk for postoperative pulmonary complications are COPD with recent exacerbation, age, obesity.  Respiratory complications generally occur in 1% of ASA Class I patients, 5% of ASA Class II and 10% of ASA Class III-IV patients These complications rarely result in mortality and include postoperative pneumonia, atelectasis, pulmonary embolism, ARDS and increased time requiring postoperative mechanical ventilation.   Overall, I recommend proceeding with the surgery if the risk for respiratory complications are outweighed by the potential benefits. This will need to be discussed between the patient and surgeon.   To reduce risks of respiratory complications, I recommend: --Pre- and post-operative incentive spirometry performed frequently while awake --Inpatient use of currently prescribed bronchodilators --Short duration of surgery as much as possible and avoid paralytic if possible --OOB, encourage mobility post-op   1) RISK FOR PROLONGED MECHANICAL VENTILAION - > 48h  1A) Arozullah - Prolonged mech ventilation risk Arozullah Postperative Pulmonary Risk Score - for mech ventilation dependence >48h [employer], [facility] 2000, major  non-cardiac surgery) Comment Score  Type of surgery - abd ao aneurysm (27), thoracic (21), neurosurgery / upper abdominal / vascular (21), neck (11) Thoracic  21  Emergency Surgery - (11)    ALbumin < 3 or poor nutritional state - (9)    BUN > 30 -  (8)    Partial or completely dependent functional status - (7)    COPD -  (6) COPD 6  Age - 60 to 57 (4), > 70  (6) 79 6  TOTAL  33  Risk Stratifcation scores  - < 10 (0.5%), 11-19 (1.8%), 20-27 (4.2%), 28-40 (10.1%), >40 (26.6%)  10.1%    I spent 32 minutes of dedicated to the care of this patient on the date of this encounter to include pre-visit review of records, face-to-face time with the patient discussing conditions above, post visit ordering of testing, clinical documentation with the electronic health record, making appropriate referrals as documented, and communicating necessary findings to members of the patients care team.   Clayton Bibles, NP 04/22/2022  Pt aware and understands NP's role.

## 2022-04-22 NOTE — Assessment & Plan Note (Addendum)
Resolving AECOPD related to URI. He is clinically improved today. Previous CXR clear. He has a few days left on his prednisone taper. Discussed that he would be a moderately high risk for surgery given his recent exacerbation; he did not want to have to postpone for months. Encouraged him on postoperative measures to avoid pulmonary complications. Continue on Breztri and Daliresp.  Patient Instructions  -Continue Breztri 2 puffs twice daily.  Brush tongue and rinse mouth well afterwards. -Continue Albuterol inhaler 2 puffs or 3 mL neb every 6 hours as needed for shortness of breath or wheezing. Notify if symptoms persist despite rescue inhaler/neb use. -Continue Astelin nasal spray 1 spray each nostril twice daily -Continue Zyrtec 10 mg daily -Continue Singulair 10 mg nightly -Continue Flonase 2 sprays each nostril daily as needed for allergies -Continue omeprazole 20 mg daily -Continue Daliresp 500 mcg daily -Continue prednisone 5 mg daily as directed by rheumatology -Continue mucinex 600 mg Twice daily  -Complete prednisone taper -Continue Tessalon perles (benzonatate) 200 mg Three times a day as needed for cough  Take your inhalers with you to surgery. Make sure you use your inhaler the morning of surgery. Out of bed as soon as possible after surgery to move around. Use incentive spirometry every hour; 10 times an hour while awake.   Follow up in 6-8 weeks with [name]. If symptoms do not improve or worsen, please contact office for sooner follow up or seek emergency care.

## 2022-04-22 NOTE — Assessment & Plan Note (Signed)
Factors that increase the risk for postoperative pulmonary complications are COPD with recent exacerbation, age, obesity.  Respiratory complications generally occur in 1% of ASA Class I patients, 5% of ASA Class II and 10% of ASA Class III-IV patients These complications rarely result in mortality and include postoperative pneumonia, atelectasis, pulmonary embolism, ARDS and increased time requiring postoperative mechanical ventilation.   Overall, I recommend proceeding with the surgery if the risk for respiratory complications are outweighed by the potential benefits. This will need to be discussed between the patient and surgeon.   To reduce risks of respiratory complications, I recommend: --Pre- and post-operative incentive spirometry performed frequently while awake --Inpatient use of currently prescribed bronchodilators --Short duration of surgery as much as possible and avoid paralytic if possible --OOB, encourage mobility post-op   1) RISK FOR PROLONGED MECHANICAL VENTILAION - > 48h  1A) Arozullah - Prolonged mech ventilation risk Arozullah Postperative Pulmonary Risk Score - for mech ventilation dependence >48h [employer], Ann Surg 2000, major non-cardiac surgery) Comment Score  Type of surgery - abd ao aneurysm (27), thoracic (21), neurosurgery / upper abdominal / vascular (21), neck (11) Thoracic  21  Emergency Surgery - (11)    ALbumin < 3 or poor nutritional state - (9)    BUN > 30 -  (8)    Partial or completely dependent functional status - (7)    COPD -  (6) COPD 6  Age - 60 to 69 (4), > 70  (6) 79 6  TOTAL  33  Risk Stratifcation scores  - < 10 (0.5%), 11-19 (1.8%), 20-27 (4.2%), 28-40 (10.1%), >40 (26.6%)  10.1%

## 2022-04-22 NOTE — Patient Instructions (Addendum)
-  Continue Breztri 2 puffs twice daily.  Brush tongue and rinse mouth well afterwards. -Continue Albuterol inhaler 2 puffs or 3 mL neb every 6 hours as needed for shortness of breath or wheezing. Notify if symptoms persist despite rescue inhaler/neb use. -Continue Astelin nasal spray 1 spray each nostril twice daily -Continue Zyrtec 10 mg daily -Continue Singulair 10 mg nightly -Continue Flonase 2 sprays each nostril daily as needed for allergies -Continue omeprazole 20 mg daily -Continue Daliresp 500 mcg daily -Continue prednisone 5 mg daily as directed by rheumatology -Continue mucinex 600 mg Twice daily  -Complete prednisone taper -Continue Tessalon perles (benzonatate) 200 mg Three times a day as needed for cough  Take your inhalers with you to surgery. Make sure you use your inhaler the morning of surgery. Out of bed as soon as possible after surgery to move around. Use incentive spirometry every hour; 10 times an hour while awake.   Follow up in 6-8 weeks with [name]. If symptoms do not improve or worsen, please contact office for sooner follow up or seek emergency care.

## 2022-04-23 ENCOUNTER — Ambulatory Visit: Payer: Medicare Other | Admitting: Nurse Practitioner

## 2022-04-24 DIAGNOSIS — M47816 Spondylosis without myelopathy or radiculopathy, lumbar region: Secondary | ICD-10-CM | POA: Diagnosis not present

## 2022-04-24 DIAGNOSIS — M4316 Spondylolisthesis, lumbar region: Secondary | ICD-10-CM | POA: Diagnosis not present

## 2022-04-24 DIAGNOSIS — Z538 Procedure and treatment not carried out for other reasons: Secondary | ICD-10-CM | POA: Diagnosis not present

## 2022-04-24 DIAGNOSIS — M5136 Other intervertebral disc degeneration, lumbar region: Secondary | ICD-10-CM | POA: Diagnosis not present

## 2022-04-24 DIAGNOSIS — M532X6 Spinal instabilities, lumbar region: Secondary | ICD-10-CM | POA: Diagnosis not present

## 2022-04-24 DIAGNOSIS — M48062 Spinal stenosis, lumbar region with neurogenic claudication: Secondary | ICD-10-CM | POA: Diagnosis not present

## 2022-04-25 DIAGNOSIS — H26493 Other secondary cataract, bilateral: Secondary | ICD-10-CM | POA: Diagnosis not present

## 2022-04-25 DIAGNOSIS — H43813 Vitreous degeneration, bilateral: Secondary | ICD-10-CM | POA: Diagnosis not present

## 2022-04-25 DIAGNOSIS — M3501 Sicca syndrome with keratoconjunctivitis: Secondary | ICD-10-CM | POA: Diagnosis not present

## 2022-04-25 DIAGNOSIS — B880 Other acariasis: Secondary | ICD-10-CM | POA: Diagnosis not present

## 2022-05-03 DIAGNOSIS — M47812 Spondylosis without myelopathy or radiculopathy, cervical region: Secondary | ICD-10-CM | POA: Diagnosis not present

## 2022-05-09 ENCOUNTER — Other Ambulatory Visit: Payer: Self-pay

## 2022-05-09 ENCOUNTER — Emergency Department (HOSPITAL_BASED_OUTPATIENT_CLINIC_OR_DEPARTMENT_OTHER): Payer: Medicare Other

## 2022-05-09 ENCOUNTER — Encounter (HOSPITAL_BASED_OUTPATIENT_CLINIC_OR_DEPARTMENT_OTHER): Payer: Self-pay

## 2022-05-09 ENCOUNTER — Emergency Department (HOSPITAL_BASED_OUTPATIENT_CLINIC_OR_DEPARTMENT_OTHER)
Admission: EM | Admit: 2022-05-09 | Discharge: 2022-05-09 | Disposition: A | Discharge status: Home / Self Care | Payer: Medicare Other | Attending: Emergency Medicine | Admitting: Emergency Medicine

## 2022-05-09 DIAGNOSIS — R2681 Unsteadiness on feet: Secondary | ICD-10-CM | POA: Diagnosis not present

## 2022-05-09 DIAGNOSIS — R2689 Other abnormalities of gait and mobility: Secondary | ICD-10-CM

## 2022-05-09 DIAGNOSIS — I6523 Occlusion and stenosis of bilateral carotid arteries: Secondary | ICD-10-CM | POA: Diagnosis not present

## 2022-05-09 DIAGNOSIS — M5136 Other intervertebral disc degeneration, lumbar region: Secondary | ICD-10-CM | POA: Diagnosis not present

## 2022-05-09 DIAGNOSIS — E876 Hypokalemia: Secondary | ICD-10-CM | POA: Insufficient documentation

## 2022-05-09 DIAGNOSIS — M542 Cervicalgia: Secondary | ICD-10-CM | POA: Diagnosis not present

## 2022-05-09 DIAGNOSIS — N189 Chronic kidney disease, unspecified: Secondary | ICD-10-CM | POA: Insufficient documentation

## 2022-05-09 DIAGNOSIS — M48062 Spinal stenosis, lumbar region with neurogenic claudication: Secondary | ICD-10-CM | POA: Diagnosis not present

## 2022-05-09 LAB — URINALYSIS, ROUTINE W REFLEX MICROSCOPIC
Bilirubin Urine: NEGATIVE
Glucose, UA: NEGATIVE mg/dL
Hgb urine dipstick: NEGATIVE
Ketones, ur: NEGATIVE mg/dL
Leukocytes,Ua: NEGATIVE
Nitrite: NEGATIVE
Protein, ur: NEGATIVE mg/dL
Specific Gravity, Urine: 1.012 (ref 1.005–1.030)
pH: 5.5 (ref 5.0–8.0)

## 2022-05-09 LAB — COMPREHENSIVE METABOLIC PANEL
ALT: 17 U/L (ref 0–44)
AST: 15 U/L (ref 15–41)
Albumin: 3.7 g/dL (ref 3.5–5.0)
Alkaline Phosphatase: 46 U/L (ref 38–126)
Anion gap: 9 (ref 5–15)
BUN: 20 mg/dL (ref 8–23)
CO2: 26 mmol/L (ref 22–32)
Calcium: 8.6 mg/dL — ABNORMAL LOW (ref 8.9–10.3)
Chloride: 102 mmol/L (ref 98–111)
Creatinine, Ser: 1.94 mg/dL — ABNORMAL HIGH (ref 0.61–1.24)
GFR, Estimated: 35 mL/min — ABNORMAL LOW (ref >60)
Glucose, Bld: 111 mg/dL — ABNORMAL HIGH (ref 70–99)
Potassium: 3.3 mmol/L — ABNORMAL LOW (ref 3.5–5.1)
Sodium: 137 mmol/L (ref 135–145)
Total Bilirubin: 0.5 mg/dL (ref 0.3–1.2)
Total Protein: 6.8 g/dL (ref 6.5–8.1)

## 2022-05-09 LAB — CBC
HCT: 37.9 % — ABNORMAL LOW (ref 39.0–52.0)
Hemoglobin: 12.7 g/dL — ABNORMAL LOW (ref 13.0–17.0)
MCH: 30.8 pg (ref 26.0–34.0)
MCHC: 33.5 g/dL (ref 30.0–36.0)
MCV: 92 fL (ref 80.0–100.0)
Platelets: 328 10*3/uL (ref 150–400)
RBC: 4.12 MIL/uL — ABNORMAL LOW (ref 4.22–5.81)
RDW: 12.6 % (ref 11.5–15.5)
WBC: 6.5 10*3/uL (ref 4.0–10.5)
nRBC: 0 % (ref 0.0–0.2)

## 2022-05-09 MED ORDER — IOHEXOL 350 MG/ML SOLN
100.0000 mL | Freq: Once | INTRAVENOUS | Status: AC | PRN
  Administered 2022-05-09: 60 mL via INTRAVENOUS

## 2022-05-09 MED ORDER — KETOROLAC TROMETHAMINE 30 MG/ML IJ SOLN
30.0000 mg | Freq: Once | INTRAMUSCULAR | Status: AC
  Administered 2022-05-09: 30 mg via INTRAVENOUS
  Filled 2022-05-09: qty 1

## 2022-05-09 NOTE — ED Notes (Signed)
Pt now oob ambulatory independently with bathroom - gait steady; no distress.

## 2022-05-09 NOTE — ED Provider Notes (Signed)
[facility] EMERGENCY DEPT Provider Note   CSN: [id number] Arrival date & time: 05/09/22  1703     History  Chief Complaint  Patient presents with   Neck Pain    Thomas Hudson is a 81 y.o. male with history of spondylosis of the cervical spine, who underwent bilateral C3-C4-C5 facet radiofrequency ablation on 05/03/22 at [facility] with Dr James North, per review of external records, presents emerged part in the company of his wife and daughter with concern for confusion, headache, neck pain.  They report he has had neck pain ever since his procedure.  He only takes Tylenol at home for his pain.  He says he gets a sharp stabbing pain near the right upper side of his neck.  He is also has a headache on and off for the past 7 days.  His family reports he seems less stable on his feet, more "wobbly".  His daughter reports he has had "short-term memory problems this week".  He denies fevers, chills, cough, congestion, dysuria or hematuria.  He is not on anticoagulation or blood thinners.  HPI     Home Medications Prior to Admission medications   Medication Sig Start Date End Date Taking? Authorizing Provider  albuterol (PROVENTIL) (2.5 MG/3ML) 0.083% nebulizer solution Take 3 mLs (2.5 mg total) by nebulization every 6 (six) hours as needed for wheezing or shortness of breath. 06/19/21 10/17/21  Desai, Nikita S, MD  albuterol (VENTOLIN HFA) 108 (90 Base) MCG/ACT inhaler Inhale 2 puffs into the lungs every 6 (six) hours as needed for wheezing or shortness of breath. 10/03/21   Desai, Nikita S, MD  alfuzosin (UROXATRAL) 10 MG 24 hr tablet TAKE 1 TABLET(10 MG) BY MOUTH DAILY 07/05/21   Letvak, Richard I, MD  amLODipine (NORVASC) 5 MG tablet Take 1 tablet (5 mg total) by mouth daily. 07/05/21   Letvak, Richard I, MD  aspirin-acetaminophen-caffeine (EXCEDRIN MIGRAINE) 250-250-65 MG tablet Take 2 tablets by mouth 3 (three) times daily as needed for headache.    [provider]  azelastine (ASTELIN) 0.1 % nasal spray Place 1 spray into both nostrils 2 (two) times daily. Use in each nostril as directed 01/15/22   Desai, Nikita S, MD  azithromycin (ZITHROMAX) 250 MG tablet Take 2 tabs (500 mg) on day one then take 1 tab daily for four additional days. 04/17/22   Cobb, Katherine V, NP  benzonatate (TESSALON) 200 MG capsule Take 1 capsule (200 mg total) by mouth 3 (three) times daily as needed for cough. 04/17/22   Cobb, Katherine V, NP  betamethasone, augmented, (DIPROLENE) 0.05 % lotion Apply 1 application. topically daily. 09/05/21   [provider]  BREZTRI AEROSPHERE 160-9-4.8 MCG/ACT AERO Inhale 2 puffs into the lungs 2 (two) times daily. 01/15/22   Desai, Nikita S, MD  Certolizumab Pegol (CIMZIA) 2 X 200 MG KIT Inject 2 each into the skin every 30 (thirty) days.    [provider]  cetirizine (ZYRTEC) 10 MG tablet Take 1 tablet (10 mg total) by mouth at bedtime. 07/05/21   Letvak, Richard I, MD  doxycycline (MONODOX) 50 MG capsule Take 50 mg by mouth daily. 08/14/20   [provider]  erythromycin ophthalmic ointment Place 1 application into both eyes daily as needed (dry eyes).    [provider]  ferrous sulfate 325 (65 FE) MG tablet Take 1 tablet (325 mg total) by mouth 3 (three) times daily with meals. 07/05/21   Letvak, Richard I, MD  finasteride (  PROSCAR) 5 MG tablet Take 1 tablet (5 mg total) by mouth daily. 07/05/21   [name], MD  fluticasone (FLONASE) 50 MCG/ACT nasal spray Place 2 sprays into both nostrils daily as needed for allergies. 01/15/22   Spero Geralds, MD  folic acid (FOLVITE) 1 MG tablet Take 1 tablet (1 mg total) by mouth daily. 04/22/22   [name], MD  gabapentin (NEURONTIN) 300 MG capsule Take 300-600 mg by mouth See admin instructions. Take 1 capsule (300 mg) by mouth in the morning & take 2 capsules (600 mg) by mouth at night. 10/14/19   [provider]  losartan (COZAAR) 100 MG tablet  Take 1 tablet (100 mg total) by mouth daily. 07/05/21   [name], MD  montelukast (SINGULAIR) 10 MG tablet Take 1 tablet (10 mg total) by mouth at bedtime. 07/05/21   [name], MD  Multiple Vitamin (MULTIVITAMIN WITH MINERALS) TABS tablet Take 1 tablet by mouth daily.    [provider]  omeprazole (PRILOSEC) 20 MG capsule Take 1 capsule (20 mg total) by mouth daily. 07/05/21   [name], MD  potassium chloride SA (KLOR-CON M) 20 MEQ tablet Take 1 tablet (20 mEq total) by mouth daily. 07/05/21   [name], MD  predniSONE (DELTASONE) 10 MG tablet 4 tabs for 2 days, then 3 tabs for 2 days, 2 tabs for 2 days, then 1 tab for 2 days, then restart 5 mg daily 04/17/22   Cobb, Karie Schwalbe, NP  predniSONE (DELTASONE) 5 MG tablet Take 1 tablet (5 mg total) by mouth See admin instructions. Take 1 tablet daily 04/22/22   [name], MD  roflumilast (DALIRESP) 500 MCG TABS tablet Take 1 tablet (500 mcg total) by mouth daily. 04/17/22   Cobb, Karie Schwalbe, NP  rosuvastatin (CRESTOR) 10 MG tablet Take 10 mg by mouth daily.    [provider]      Allergies    Morphine and related    Review of Systems   Review of Systems  Physical Exam Updated Vital Signs BP (!) 136/99   Pulse 67   Temp 98 F (36.7 C) (Oral)   Resp 17   Ht 5' 7" (1.702 m)   Wt 88.2 kg   SpO2 98%   BMI 30.45 kg/m  Physical Exam Constitutional:      General: He is not in acute distress. HENT:     Head: Normocephalic and atraumatic.  Eyes:     Conjunctiva/sclera: Conjunctivae normal.     Pupils: Pupils are equal, round, and reactive to light.  Cardiovascular:     Rate and Rhythm: Normal rate and regular rhythm.  Pulmonary:     Effort: Pulmonary effort is normal. No respiratory distress.  Abdominal:     General: There is no distension.     Tenderness: There is no abdominal tenderness.  Skin:    General: Skin is warm and dry.  Neurological:     General: No focal  deficit present.     Mental Status: He is alert and oriented to person, place, and time. Mental status is at baseline.     Sensory: No sensory deficit.     Motor: No weakness.     Coordination: Coordination normal.  Psychiatric:        Mood and Affect: Mood normal.        Behavior: Behavior normal.     ED Results / Procedures / Treatments   Labs (all labs  ordered are listed, but only abnormal results are displayed) Labs Reviewed  COMPREHENSIVE METABOLIC PANEL - Abnormal; Notable for the following components:      Result Value   Potassium 3.3 (*)    Glucose, Bld 111 (*)    Creatinine, Ser 1.94 (*)    Calcium 8.6 (*)    GFR, Estimated 35 (*)    All other components within normal limits  CBC - Abnormal; Notable for the following components:   RBC 4.12 (*)    Hemoglobin 12.7 (*)    HCT 37.9 (*)    All other components within normal limits  URINALYSIS, ROUTINE W REFLEX MICROSCOPIC    EKG None  Radiology CT ANGIO HEAD NECK W WO CM  Result Date: 05/09/2022 CLINICAL DATA:  Initial evaluation for acute onset headache. EXAM: CT ANGIOGRAPHY HEAD AND NECK TECHNIQUE: Multidetector CT imaging of the head and neck was performed using the standard protocol during bolus administration of intravenous contrast. Multiplanar CT image reconstructions and MIPs were obtained to evaluate the vascular anatomy. Carotid stenosis measurements (when applicable) are obtained utilizing NASCET criteria, using the distal internal carotid diameter as the denominator. RADIATION DOSE REDUCTION: This exam was performed according to the departmental dose-optimization program which includes automated exposure control, adjustment of the mA and/or kV according to patient size and/or use of iterative reconstruction technique. CONTRAST:  60mL OMNIPAQUE IOHEXOL 350 MG/ML SOLN COMPARISON:  None Available. FINDINGS: CT HEAD FINDINGS Brain: Cerebral volume within normal limits. Patchy and confluent hypodensity involving the  supratentorial cerebral white matter, most characteristic of chronic microvascular ischemic disease, moderately advanced in nature. No acute intracranial hemorrhage. No acute cortically based infarct. No mass lesion or midline shift. No hydrocephalus or extra-axial fluid collection. Vascular: No abnormal hyperdense vessel. Scattered vascular calcifications noted within the carotid siphons. Skull: Scalp soft tissues and calvarium within normal limits. Sinuses/Orbits: Globes and orbital soft tissues within normal limits. Paranasal sinuses are clear. No mastoid effusion. Other: None. Review of the MIP images confirms the above findings CTA NECK FINDINGS Aortic arch: Visualized aortic arch normal caliber with standard branch pattern. Mild atheromatous change about the arch itself. No significant stenosis. Right carotid system: Right common and internal carotid arteries patent without dissection or occlusion. Calcified plaque about the proximal right ICA with associated stenosis of up to 40-50% by NASCET criteria. Left carotid system: Left common and internal carotid arteries patent without dissection or occlusion. Calcified plaque about the left carotid bulb/proximal left ICA with associated stenosis of up to 60-70% by NASCET criteria. Vertebral arteries: Both vertebral arteries arise from subclavian arteries. Atheromatous change at the proximal left subclavian artery with associated stenosis of up to 40% (series 10, image 307). Vertebral arteries M cells patent without stenosis or dissection. Right vertebral artery dominant. Skeleton: No discrete or worrisome osseous lesions. Moderate spondylosis present at C5-6 and C6-7. Other neck: No other acute soft tissue abnormality within the neck. Upper chest: Visualized upper chest demonstrates no acute finding. Paraseptal emphysema noted. Review of the MIP images confirms the above findings CTA HEAD FINDINGS Anterior circulation: Petrous segments widely patent. Atheromatous  change within the carotid siphons with associated mild narrowing on the right and up to moderate stenosis on the left. A1 segments patent. Normal anterior communicating artery complex. Anterior cerebral arteries widely patent. No M1 stenosis or occlusion. Normal MCA bifurcations. No proximal MCA branch occlusion. Distal MCA branches perfused and symmetric. Posterior circulation: Both V4 segments widely patent. Left PICA patent. Right PICA origin not well seen. Basilar   widely patent to its distal aspect. Superior cerebellar and posterior cerebral arteries patent bilaterally. Venous sinuses: Patent allowing for timing the contrast bolus. Anatomic variants: None significant.  No aneurysm. Review of the MIP images confirms the above findings IMPRESSION: CT HEAD IMPRESSION: 1. No acute intracranial abnormality. 2. Moderate chronic microvascular ischemic disease. CTA HEAD AND NECK IMPRESSION: 1. Negative CTA for large vessel occlusion or other emergent finding. No aneurysm. 2. Calcified plaque about the proximal ICAs with associated stenoses of up to 40-50% on the right and 60-70% on the left. 3. Atheromatous change within the carotid siphons with associated mild to moderate narrowing, left worse than right. 4. 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 Aortic Atherosclerosis (ICD10-I70.0) and Emphysema (ICD10-J43.9). Electronically Signed   By: Jeannine Boga M.D.   On: 05/09/2022 21:11    Procedures Procedures    Medications Ordered in ED Medications  ketorolac (TORADOL) 30 MG/ML injection 30 mg (30 mg Intravenous Given 05/09/22 1955)  iohexol (OMNIPAQUE) 350 MG/ML injection 100 mL (60 mLs Intravenous Contrast Given 05/09/22 2006)    ED Course/ Medical Decision Making/ A&P Clinical Course as of 05/09/22 2302  Thu May 09, 2022  2129 No emergent findings on CTA, discussed this with the patient and his family.  They are ready to go home, the patient has a follow-up visit tomorrow with his pain  management clinic to perform the procedure.  They verbalized understanding [MT]    Clinical Course User Index [MT] Jamiere Gulas, Carola Rhine, MD                           Medical Decision Making Amount and/or Complexity of Data Reviewed Labs: ordered. Radiology: ordered.  Risk Prescription drug management.   This patient presents to the ED with concern for neck pain, headache, mild confusion, balance difficulty. This involves an extensive number of treatment options, and is a complaint that carries with it a high risk of complications and morbidity.  The differential diagnosis includes vertebral artery injury or vascular injury from surgical procedure, versus CVA, versus UTI or other infection, versus metabolic derangement, versus other  Co-morbidities that complicate the patient evaluation: History of recent surgery at high risk for surgical complications, as noted above  Additional history obtained from the patient's wife and daughter  External records from outside source obtained and reviewed including procedure noted form 05/03/22 from [facility]  I ordered and personally interpreted labs.  The pertinent results include: Hypokalemia, no other significant findings, chronic kidney disease.  UA without evidence of infection  I ordered imaging studies including CTA of the head and neck I independently visualized and interpreted imaging which showed some stenosis 40 to 50% of the vasculature without vertebral dissection or critical stenosis or stroke I agree with the radiologist interpretation  The patient was maintained on a cardiac monitor.  I personally viewed and interpreted the cardiac monitored which showed an underlying rhythm of: Normal sinus rhythm  I ordered medication including Toradol for headache  I have reviewed the patients home medicines and have made adjustments as needed  Test Considered: I will lower suspicion for meningitis or subarachnoid hemorrhage with his presentation,  he has no nuchal rigidity, no photophobia, do not believe an emergent lumbar puncture is indicated.  After the interventions noted above, I reevaluated the patient and found that they have: stayed the same  Dispostion:  After consideration of the diagnostic results and the patients response to treatment, I feel that  the patent would benefit from family will follow-up with their specialist tomorrow for scheduled visit.         Final Clinical Impression(s) / ED Diagnoses Final diagnoses:  Neck pain  Balance problem    Rx / DC Orders ED Discharge Orders     None         Wyvonnia Dusky, MD 05/09/22 2302

## 2022-05-09 NOTE — ED Notes (Signed)
Daughter asking for update - Dr. Langston Masker notified via secure chat; awaiting response

## 2022-05-09 NOTE — ED Notes (Addendum)
Pt c/o pain in back of neck. Slight bruising. A/Ox4, VSS. Family at bedside. No numbness or tingling. States " he has headaches and feels off since ablation".

## 2022-05-09 NOTE — ED Triage Notes (Signed)
Patient here POV from Home.  Endorses Recent C3-5 Ablation 1 Week ago.   Endorses Headaches since. Family also endorses Confusion in which he is mixing up his dates. Also endorses Subjective Fevers but None Confirmed.   NAD Noted during Triage. A&Ox4. GCS 15. Ambulatory.

## 2022-05-09 NOTE — Discharge Instructions (Signed)
Please follow-up with the pain management clinic that performed your ablation procedure on your neck as soon as possible.  Let them know that the CT scans are available on your electronic records, but did not show any signs of vertebral dissection or blood vessel injury or stroke.  Please call 911 or return to the ER if you have worsening or severe headache, blurred vision or loss of vision, persistent vomiting, numbness or weakness of your arms or legs, or any other strokelike symptoms.

## 2022-05-09 NOTE — ED Notes (Signed)
Patient transported to CT via stretcher at this time - pt remains awake and alert; no acute changes noted

## 2022-05-09 NOTE — ED Notes (Signed)
ED Provider at bedside. 

## 2022-05-09 NOTE — ED Notes (Signed)
Late entry -- Pt has returned from [place] via stretcher; remains awake and alert- no obvious distress noted.

## 2022-05-09 NOTE — ED Notes (Signed)
ED provider now at bedside   

## 2022-05-09 NOTE — ED Notes (Addendum)
Pt agreeable with d/c plan as discussed by provider- this nurse has verbally reinforced d/c instructions and provided pt with written copy - pt acknowledges verbal understanding and denies any addl questions concerns needs - declined wheelchair; ambulatory independently with steady gait escorted by family members

## 2022-05-10 ENCOUNTER — Telehealth: Payer: Self-pay

## 2022-05-10 NOTE — Telephone Encounter (Signed)
Spoke to pt's daughter, Anderson Malta, about his trip to the ER for neck pain after a recent procedure. She said they are following up with the specialist today.

## 2022-05-14 ENCOUNTER — Telehealth: Payer: Self-pay

## 2022-05-14 NOTE — Progress Notes (Addendum)
Chronic Care Management Pharmacy Assistant   Name: Thomas Hudson  MRN: [id number] DOB: May 18, 1941  Reason for Encounter: Non-CCM [facility] Follow Up)  Medications: Outpatient Encounter Medications as of 05/14/2022  Medication Sig Note   albuterol (PROVENTIL) (2.5 MG/3ML) 0.083% nebulizer solution Take 3 mLs (2.5 mg total) by nebulization every 6 (six) hours as needed for wheezing or shortness of breath.    albuterol (VENTOLIN HFA) 108 (90 Base) MCG/ACT inhaler Inhale 2 puffs into the lungs every 6 (six) hours as needed for wheezing or shortness of breath.    alfuzosin (UROXATRAL) 10 MG 24 hr tablet TAKE 1 TABLET(10 MG) BY MOUTH DAILY    amLODipine (NORVASC) 5 MG tablet Take 1 tablet (5 mg total) by mouth daily.    aspirin-acetaminophen-caffeine (EXCEDRIN MIGRAINE) 250-250-65 MG tablet Take 2 tablets by mouth 3 (three) times daily as needed for headache. 07/04/2021: prn   azelastine (ASTELIN) 0.1 % nasal spray Place 1 spray into both nostrils 2 (two) times daily. Use in each nostril as directed    azithromycin (ZITHROMAX) 250 MG tablet Take 2 tabs (500 mg) on day one then take 1 tab daily for four additional days.    benzonatate (TESSALON) 200 MG capsule Take 1 capsule (200 mg total) by mouth 3 (three) times daily as needed for cough.    betamethasone, augmented, (DIPROLENE) 0.05 % lotion Apply 1 application. topically daily.    BREZTRI AEROSPHERE 160-9-4.8 MCG/ACT AERO Inhale 2 puffs into the lungs 2 (two) times daily.    Certolizumab Pegol (CIMZIA) 2 X 200 MG KIT Inject 2 each into the skin every 30 (thirty) days.    cetirizine (ZYRTEC) 10 MG tablet Take 1 tablet (10 mg total) by mouth at bedtime.    doxycycline (MONODOX) 50 MG capsule Take 50 mg by mouth daily.    erythromycin ophthalmic ointment Place 1 application into both eyes daily as needed (dry eyes).    ferrous sulfate 325 (65 FE) MG tablet Take 1 tablet (325 mg total) by mouth 3 (three) times daily with meals.    finasteride  (PROSCAR) 5 MG tablet Take 1 tablet (5 mg total) by mouth daily.    fluticasone (FLONASE) 50 MCG/ACT nasal spray Place 2 sprays into both nostrils daily as needed for allergies.    folic acid (FOLVITE) 1 MG tablet Take 1 tablet (1 mg total) by mouth daily.    gabapentin (NEURONTIN) 300 MG capsule Take 300-600 mg by mouth See admin instructions. Take 1 capsule (300 mg) by mouth in the morning & take 2 capsules (600 mg) by mouth at night.    losartan (COZAAR) 100 MG tablet Take 1 tablet (100 mg total) by mouth daily.    montelukast (SINGULAIR) 10 MG tablet Take 1 tablet (10 mg total) by mouth at bedtime.    Multiple Vitamin (MULTIVITAMIN WITH MINERALS) TABS tablet Take 1 tablet by mouth daily.    omeprazole (PRILOSEC) 20 MG capsule Take 1 capsule (20 mg total) by mouth daily.    potassium chloride SA (KLOR-CON M) 20 MEQ tablet Take 1 tablet (20 mEq total) by mouth daily.    predniSONE (DELTASONE) 10 MG tablet 4 tabs for 2 days, then 3 tabs for 2 days, 2 tabs for 2 days, then 1 tab for 2 days, then restart 5 mg daily    predniSONE (DELTASONE) 5 MG tablet Take 1 tablet (5 mg total) by mouth See admin instructions. Take 1 tablet daily    roflumilast (DALIRESP) 500 MCG TABS tablet  Take 1 tablet (500 mcg total) by mouth daily.    rosuvastatin (CRESTOR) 10 MG tablet Take 10 mg by mouth daily.    No facility-administered encounter medications on file as of 05/14/2022.    Reviewed hospital notes for details of recent visit. Has patient been contacted by Transitions of Care team? No Has patient seen PCP/specialist for hospital follow up (summarize OV if yes): No  Admitted to the ED on 05/09/2022. Discharge date was 05/09/2022.  Discharged from [facility] Discharge diagnosis (Principal Problem): Neck Pain Patient was discharged to Home  Brief summary of hospital course: This patient presents to the ED with concern for neck pain, headache, mild confusion, balance difficulty. This  involves an extensive number of treatment options, and is a complaint that carries with it a high risk of complications and morbidity.  The differential diagnosis includes vertebral artery injury or vascular injury from surgical procedure, versus CVA, versus UTI or other infection, versus metabolic derangement, versus other   The pertinent results include: Hypokalemia, no other significant findings, chronic kidney disease.  UA without evidence of infection   ordered imaging studies including CTA of the head and neck visualized and interpreted imaging which showed some stenosis 40 to 50% of the vasculature without vertebral dissection or critical stenosis or stroke  The patient was maintained on a cardiac monitor.  Normal sinus rhythm   ordered medication including Toradol for headache  follow-up with their specialist tomorrow for scheduled visit.   Medications that remain the same after Hospital Discharge:??  -All other medications will remain the same.    Next CCM appt: Non-CCM  Other upcoming appts: PCP appointment on 05/22/2022 for Physical Pulmonology appointment on 06/04/2022  Charlene Brooke, PharmD notified and will determine if action is needed.  Charlene Brooke, CPP notified  Marijean Niemann, [place] Clinical Pharmacy Assistant [phone]   Pharmacist addendum: Patient has had appropriate follow up. Nothing further needed.  Charlene Brooke, PharmD, BCACP 06/04/22 1:00 PM

## 2022-05-15 DIAGNOSIS — M0579 Rheumatoid arthritis with rheumatoid factor of multiple sites without organ or systems involvement: Secondary | ICD-10-CM | POA: Diagnosis not present

## 2022-05-17 DIAGNOSIS — M50323 Other cervical disc degeneration at C6-C7 level: Secondary | ICD-10-CM | POA: Diagnosis not present

## 2022-05-17 DIAGNOSIS — R531 Weakness: Secondary | ICD-10-CM | POA: Diagnosis not present

## 2022-05-17 DIAGNOSIS — M4312 Spondylolisthesis, cervical region: Secondary | ICD-10-CM | POA: Diagnosis not present

## 2022-05-17 DIAGNOSIS — M542 Cervicalgia: Secondary | ICD-10-CM | POA: Diagnosis not present

## 2022-05-17 DIAGNOSIS — M47812 Spondylosis without myelopathy or radiculopathy, cervical region: Secondary | ICD-10-CM | POA: Diagnosis not present

## 2022-05-17 DIAGNOSIS — R29898 Other symptoms and signs involving the musculoskeletal system: Secondary | ICD-10-CM | POA: Diagnosis not present

## 2022-05-17 DIAGNOSIS — I999 Unspecified disorder of circulatory system: Secondary | ICD-10-CM | POA: Diagnosis not present

## 2022-05-17 DIAGNOSIS — M5412 Radiculopathy, cervical region: Secondary | ICD-10-CM | POA: Diagnosis not present

## 2022-05-17 DIAGNOSIS — M50322 Other cervical disc degeneration at C5-C6 level: Secondary | ICD-10-CM | POA: Diagnosis not present

## 2022-05-21 ENCOUNTER — Telehealth: Payer: Self-pay

## 2022-05-21 NOTE — Telephone Encounter (Signed)
     Patient  visit on 05/09/2022  at [facility] was for Cervicalgia.  Have you been able to follow up with your primary care physician? Yes  The patient was or was not able to obtain any needed medicine or equipment. No meds prescribed.  Are there diet recommendations that you are having difficulty following? No  Patient expresses understanding of discharge instructions and education provided has no other needs at this time.    [place] Resource Care Guide   [email or web address]  ?? [phone]   Website: [email or web address]  [email or web address]

## 2022-05-22 ENCOUNTER — Ambulatory Visit (INDEPENDENT_AMBULATORY_CARE_PROVIDER_SITE_OTHER): Payer: Medicare Other | Admitting: Internal Medicine

## 2022-05-22 ENCOUNTER — Encounter: Payer: Self-pay | Admitting: Internal Medicine

## 2022-05-22 VITALS — BP 100/72 | HR 62 | Temp 97.6°F | Ht 66.5 in | Wt 196.0 lb

## 2022-05-22 DIAGNOSIS — N1832 Chronic kidney disease, stage 3b: Secondary | ICD-10-CM

## 2022-05-22 DIAGNOSIS — I10 Essential (primary) hypertension: Secondary | ICD-10-CM | POA: Diagnosis not present

## 2022-05-22 DIAGNOSIS — N401 Enlarged prostate with lower urinary tract symptoms: Secondary | ICD-10-CM | POA: Diagnosis not present

## 2022-05-22 DIAGNOSIS — J439 Emphysema, unspecified: Secondary | ICD-10-CM

## 2022-05-22 DIAGNOSIS — M06049 Rheumatoid arthritis without rheumatoid factor, unspecified hand: Secondary | ICD-10-CM | POA: Diagnosis not present

## 2022-05-22 DIAGNOSIS — Z0000 Encounter for general adult medical examination without abnormal findings: Secondary | ICD-10-CM

## 2022-05-22 DIAGNOSIS — J4489 Other specified chronic obstructive pulmonary disease: Secondary | ICD-10-CM

## 2022-05-22 DIAGNOSIS — Z23 Encounter for immunization: Secondary | ICD-10-CM

## 2022-05-22 LAB — RENAL FUNCTION PANEL
Albumin: 3.8 g/dL (ref 3.5–5.2)
BUN: 16 mg/dL (ref 6–23)
CO2: 29 mEq/L (ref 19–32)
Calcium: 9.3 mg/dL (ref 8.4–10.5)
Chloride: 103 mEq/L (ref 96–112)
Creatinine, Ser: 1.26 mg/dL (ref 0.40–1.50)
GFR: 54.3 mL/min — ABNORMAL LOW (ref >60.00)
Glucose, Bld: 145 mg/dL — ABNORMAL HIGH (ref 70–99)
Phosphorus: 3.1 mg/dL (ref 2.3–4.6)
Potassium: 3.2 mEq/L — ABNORMAL LOW (ref 3.5–5.1)
Sodium: 141 mEq/L (ref 135–145)

## 2022-05-22 LAB — VITAMIN D 25 HYDROXY (VIT D DEFICIENCY, FRACTURES): VITD: 17.03 ng/mL — ABNORMAL LOW (ref 30.00–100.00)

## 2022-05-22 NOTE — Addendum Note (Signed)
Addended by: Pilar Grammes on: 05/22/2022 09:31 AM   Modules accepted: Orders

## 2022-05-22 NOTE — Assessment & Plan Note (Addendum)
Will recheck If not improved---consider nephrology Could be from the new BP meds---consider stopping chlorthalidone

## 2022-05-22 NOTE — Assessment & Plan Note (Signed)
Quiet on cimzia

## 2022-05-22 NOTE — Progress Notes (Signed)
Subjective:    Patient ID: Thomas Hudson, male    DOB: 1941-04-11, 81 y.o.   MRN: [id number]  HPI Here for Medicare wellness visit and follow up of chronic health conditions Reviewed advanced directives Reviewed other doctors----Dr North--pain, Dr Nicki [place], Dr Syed--rheumatology, Dr Semelka--gerontology, Dr Bonnielee Haff, Dr Dingledein--ophthal, Dr Darrold Span, Dr Nahser--cardiology, Dr Bell--dentist,  Had cataracts done by Dr George Ina this year. No hospitalizations Vision is better since cataract surgery Hearing still bad--no aides No regular exercise--discussed resistance No tobacco or alcohol No falls No depression or anhedonia Independent with instrumental ADLs No sig memory issues  Having ongoing back pain Surgery was considered---but unable to do the procedure for implant (to separate vertebrae) Had ablation by pain doctor Nerve study done for feet pain  BP med changed by gerontologist Foot swelling improved Now on losartan and chlorthalidone Sometimes when he gets up, if sitting for a while, gets dizzy briefly.  No problems in bed or with change in head position BP usually in 120's No chest pain Occasional feeling of racing heart---up to a couple of minutes  Recent labs at ER visit GFR down to 35  Rheumatoid arthritis doing okay Hands/legs okay  Voids okay on current meds Nocturia x 1-2  Breathing is not great--feels "heavy" DOE is perhaps slightly worse Still some cough---productive  Current Outpatient Medications on File Prior to Visit  Medication Sig Dispense Refill   albuterol (VENTOLIN HFA) 108 (90 Base) MCG/ACT inhaler Inhale 2 puffs into the lungs every 6 (six) hours as needed for wheezing or shortness of breath. 3 each 4   alfuzosin (UROXATRAL) 10 MG 24 hr tablet TAKE 1 TABLET(10 MG) BY MOUTH DAILY 90 tablet 3   aspirin-acetaminophen-caffeine (EXCEDRIN MIGRAINE) 656-812-75 MG tablet Take 2 tablets by mouth 3 (three) times daily as needed for  headache.     azelastine (ASTELIN) 0.1 % nasal spray Place 1 spray into both nostrils 2 (two) times daily. Use in each nostril as directed 90 mL 3   betamethasone, augmented, (DIPROLENE) 0.05 % lotion Apply 1 application. topically daily.     BREZTRI AEROSPHERE 160-9-4.8 MCG/ACT AERO Inhale 2 puffs into the lungs 2 (two) times daily. 17.0 g 6   Certolizumab Pegol (CIMZIA) 2 X 200 MG KIT Inject 2 each into the skin every 30 (thirty) days.     cetirizine (ZYRTEC) 10 MG tablet Take 1 tablet (10 mg total) by mouth at bedtime. 90 tablet 3   chlorthalidone (HYGROTON) 25 MG tablet Take 25 mg by mouth daily.     doxycycline (MONODOX) 50 MG capsule Take 50 mg by mouth daily.     erythromycin ophthalmic ointment Place 1 application into both eyes daily as needed (dry eyes).     ferrous sulfate 325 (65 FE) MG tablet Take 1 tablet (325 mg total) by mouth 3 (three) times daily with meals. 270 tablet 3   finasteride (PROSCAR) 5 MG tablet Take 1 tablet (5 mg total) by mouth daily. 90 tablet 3   fluticasone (FLONASE) 50 MCG/ACT nasal spray Place 2 sprays into both nostrils daily as needed for allergies. 48 g 3   folic acid (FOLVITE) 1 MG tablet Take 1 tablet (1 mg total) by mouth daily. 90 tablet 3   gabapentin (NEURONTIN) 300 MG capsule Take 300-600 mg by mouth See admin instructions. Take 1 capsule (300 mg) by mouth in the morning & take 2 capsules (600 mg) by mouth at night.     losartan (COZAAR) 100 MG tablet Take 1 tablet (  100 mg total) by mouth daily. 90 tablet 3   montelukast (SINGULAIR) 10 MG tablet Take 1 tablet (10 mg total) by mouth at bedtime. 90 tablet 3   Multiple Vitamin (MULTIVITAMIN WITH MINERALS) TABS tablet Take 1 tablet by mouth daily.     omeprazole (PRILOSEC) 20 MG capsule Take 1 capsule (20 mg total) by mouth daily. 90 capsule 3   potassium chloride SA (KLOR-CON M) 20 MEQ tablet Take 1 tablet (20 mEq total) by mouth daily. 90 tablet 3   predniSONE (DELTASONE) 5 MG tablet Take 1 tablet (5 mg  total) by mouth See admin instructions. Take 1 tablet daily 90 tablet 3   roflumilast (DALIRESP) 500 MCG TABS tablet Take 1 tablet (500 mcg total) by mouth daily. 90 tablet 3   rosuvastatin (CRESTOR) 10 MG tablet Take 10 mg by mouth daily.     albuterol (PROVENTIL) (2.5 MG/3ML) 0.083% nebulizer solution Take 3 mLs (2.5 mg total) by nebulization every 6 (six) hours as needed for wheezing or shortness of breath. 360 mL 3   No current facility-administered medications on file prior to visit.    Allergies  Allergen Reactions   Morphine And Related Other (See Comments)    Itching & rash Because of a history of documented adverse serious drug reaction;Medi Alert bracelet  is recommended    Past Medical History:  Diagnosis Date   BPH (benign prostatic hyperplasia)    Carpal tunnel syndrome on both sides    Complication of anesthesia    Low BP and O2 sats after EGD   COPD (chronic obstructive pulmonary disease) (HCC)    GERD (gastroesophageal reflux disease)    Hyperlipidemia    Hypertension    RAD (reactive airway disease)    no PMH of asthma; RAD post infection   Rheumatoid arthritis ([place])         Past Surgical History:  Procedure Laterality Date   BIOPSY  01/06/2018   Procedure: BIOPSY;  Surgeon: Irene Shipper, MD;  Location: [facility];  Service: Endoscopy;;   BIOPSY  01/03/2020   Procedure: BIOPSY;  Surgeon: Irene Shipper, MD;  Location: [facility];  Service: Endoscopy;;   CATARACT EXTRACTION W/PHACO Left 09/11/2021   Procedure: CATARACT EXTRACTION PHACO AND INTRAOCULAR LENS PLACEMENT (IOC) LEFT 6.38 00:41.6;  Surgeon: Birder Robson, MD;  Location: [place];  Service: Ophthalmology;  Laterality: Left;   CATARACT EXTRACTION W/PHACO Right 09/25/2021   Procedure: CATARACT EXTRACTION PHACO AND INTRAOCULAR LENS PLACEMENT (IOC) RIGHT 8.01 00:49.9;  Surgeon: Birder Robson, MD;  Location: [place];  Service: Ophthalmology;  Laterality: Right;   COLONOSCOPY W/  POLYPECTOMY  2009   X 2; Dr.Perry    ERCP N/A 11/05/2016   Procedure: ENDOSCOPIC RETROGRADE CHOLANGIOPANCREATOGRAPHY (ERCP);  Surgeon: Irene Shipper, MD;  Location: [facility];  Service: Endoscopy;  Laterality: N/A;   ESOPHAGOGASTRODUODENOSCOPY (EGD) WITH PROPOFOL N/A 07/29/2016   Procedure: ESOPHAGOGASTRODUODENOSCOPY (EGD) WITH PROPOFOL;  Surgeon: Irene Shipper, MD;  Location: [facility];  Service: Endoscopy;  Laterality: N/A;  will need ERCP scope   ESOPHAGOGASTRODUODENOSCOPY (EGD) WITH PROPOFOL N/A 01/06/2018   Procedure: ESOPHAGOGASTRODUODENOSCOPY (EGD) WITH PROPOFOL;  Surgeon: Irene Shipper, MD;  Location: [facility];  Service: Endoscopy;  Laterality: N/A;  NEED ERCP SCOPE   ESOPHAGOGASTRODUODENOSCOPY (EGD) WITH PROPOFOL N/A 01/03/2020   Procedure: ESOPHAGOGASTRODUODENOSCOPY (EGD) WITH PROPOFOL;  Surgeon: Irene Shipper, MD;  Location: [facility];  Service: Endoscopy;  Laterality: N/A;  Needs ERCP scope   HERNIA REPAIR  LUMBAR LAMINECTOMY  07/2008   UPPER GASTROINTESTINAL ENDOSCOPY      gastric polyp;Dr.Perry    VASECTOMY     WHIPPLE PROCEDURE  2012   partial ; [facility]    Family History  Problem Relation Age of Onset   Skin cancer Mother    Throat cancer Father        smoker   Esophageal cancer Father    Uterine cancer Sister    COPD Sister        X2   Ovarian cancer Sister    Throat cancer Sister        smoker   Breast cancer Sister    Lung cancer Sister        smoker   Leukemia Sister    Coronary artery disease Brother        in 22s   Lung cancer Brother        kidney cancer   Throat cancer Brother    Heart disease Brother    Heart disease Brother    Heart disease Maternal Grandfather    Stroke Paternal Grandmother 69   Colon cancer Paternal Grandfather    Heart disease Paternal Grandfather    Prostate cancer Paternal Uncle    Heart attack Paternal Uncle        3 uncles, >55   Asthma Neg Hx     Social History   Socioeconomic History   Marital status:  Married    Spouse name: Not on file   Number of children: 2   Years of education: Not on file   Highest education level: Not on file  Occupational History   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[employer]    Comment: Retired  Tobacco Use   Smoking status: Former    Packs/day: 2.00    Years: 50.00    Total pack years: 100.00    Types: Cigarettes    Quit date: 07/08/1997    Years since quitting: 24.8    Passive exposure: Past   Smokeless tobacco: Never  Vaping Use   Vaping Use: Never used  Substance and Sexual Activity   Alcohol use: No    Alcohol/week: 0.0 standard drinks of alcohol   Drug use: No   Sexual activity: Yes  Other Topics Concern   Not on file  Social History Narrative   Has living will   Requests daughters as health care POA   Would accept resuscitation attempts   Not sure about tube feeds--would leave to daughters   Social Determinants of Health   Financial Resource Strain: Not on file  Food Insecurity: Not on file  Transportation Needs: Not on file  Physical Activity: Not on file  Stress: Not on file  Social Connections: Not on file  Intimate Partner Violence: Not on file   Review of Systems Appetite is okay Weight down slightly Sleeps okay Wears seat belt Not many teeth---keeps up with dentist Has spot on left temple--needs derm visit No heartburn or dysphagia Bowels are slow at times--uses dulcolax/miralax prn    Objective:   Physical Exam Constitutional:      Appearance: Normal appearance.  HENT:     Mouth/Throat:     Comments: No lesions Eyes:     Conjunctiva/sclera: Conjunctivae normal.     Pupils: Pupils are equal, round, and reactive to light.  Cardiovascular:     Rate and Rhythm: Normal rate and regular rhythm.     Pulses: Normal pulses.     Heart sounds: No murmur  heard.    No gallop.  Pulmonary:     Effort: Pulmonary effort is normal.     Breath sounds: No wheezing or rales.     Comments: Decreased breath sounds but  clear Abdominal:     Palpations: Abdomen is soft.     Tenderness: There is no abdominal tenderness.  Musculoskeletal:     Cervical back: Neck supple.     Right lower leg: No edema.     Left lower leg: No edema.     Comments: No hand synovitis  Lymphadenopathy:     Cervical: No cervical adenopathy.  Skin:    Findings: No lesion or rash.  Neurological:     General: No focal deficit present.     Mental Status: He is alert and oriented to person, place, and time.     Comments: Mini-cog normal  Psychiatric:        Mood and Affect: Mood normal.        Behavior: Behavior normal.            Assessment & Plan:

## 2022-05-22 NOTE — Assessment & Plan Note (Signed)
Voids okay on alfulzosin 10,  finasteride 5 daily

## 2022-05-22 NOTE — Progress Notes (Signed)
Hearing Screening - Comments:: Aware of hearing loss.  Vision Screening - Comments:: October 2023

## 2022-05-22 NOTE — Assessment & Plan Note (Signed)
I have personally reviewed the Medicare Annual Wellness questionnaire and have noted 1. The patient's medical and social history 2. Their use of alcohol, tobacco or illicit drugs 3. Their current medications and supplements 4. The patient's functional ability including ADL's, fall risks, home safety risks and hearing or visual             impairment. 5. Diet and physical activities 6. Evidence for depression or mood disorders  The patients weight, height, BMI and visual acuity have been recorded in the chart I have made referrals, counseling and provided education to the patient based review of the above and I have provided the pt with a written personalized care plan for preventive services.  I have provided you with a copy of your personalized plan for preventive services. Please take the time to review along with your updated medication list.  Done with cancer screening Flu vaccine today Td, shingrix, COVID vaccines at the pharmacy Discussed doing at least resistance exercise

## 2022-05-22 NOTE — Assessment & Plan Note (Signed)
BP Readings from Last 3 Encounters:  05/22/22 100/72  05/09/22 (!) 136/99  04/22/22 (!) 134/58   Edema gone off amlodipine Mild orthostatic symptoms on losartan 100/chlorthalidone 25 No change for now

## 2022-05-22 NOTE — Patient Instructions (Signed)
I recommend a tetanus booster, COVID vaccine and possibly the shingrix vaccine at your pharmacy.

## 2022-05-22 NOTE — Assessment & Plan Note (Signed)
Chronic DOE Going back to Dr Shearon Stalls On daliresp and breztri

## 2022-05-23 LAB — PARATHYROID HORMONE, INTACT (NO CA): PTH: 33 pg/mL (ref 16–77)

## 2022-05-29 ENCOUNTER — Telehealth: Payer: Self-pay

## 2022-05-29 DIAGNOSIS — E876 Hypokalemia: Secondary | ICD-10-CM

## 2022-05-29 DIAGNOSIS — M4802 Spinal stenosis, cervical region: Secondary | ICD-10-CM | POA: Diagnosis not present

## 2022-05-29 DIAGNOSIS — M542 Cervicalgia: Secondary | ICD-10-CM | POA: Diagnosis not present

## 2022-05-29 NOTE — Telephone Encounter (Signed)
Left message on VM per DPR to call office.

## 2022-05-29 NOTE — Telephone Encounter (Signed)
-----   Message from [name], MD sent at 05/23/2022  1:09 PM EST ----- Results released Please call Needs to increase the potassium to twice a day--send new Rx prn Start vitamin D 1000 units daily Find out if he wants to repeat the potassium here in a month or so--or wait till his visit with the gerontologist

## 2022-06-04 ENCOUNTER — Encounter: Payer: Self-pay | Admitting: Nurse Practitioner

## 2022-06-04 ENCOUNTER — Ambulatory Visit (INDEPENDENT_AMBULATORY_CARE_PROVIDER_SITE_OTHER): Payer: Medicare Other | Admitting: Nurse Practitioner

## 2022-06-04 VITALS — BP 122/70 | HR 81 | Ht 67.0 in | Wt 198.2 lb

## 2022-06-04 DIAGNOSIS — J4489 Other specified chronic obstructive pulmonary disease: Secondary | ICD-10-CM

## 2022-06-04 DIAGNOSIS — J301 Allergic rhinitis due to pollen: Secondary | ICD-10-CM | POA: Diagnosis not present

## 2022-06-04 DIAGNOSIS — J439 Emphysema, unspecified: Secondary | ICD-10-CM | POA: Diagnosis not present

## 2022-06-04 DIAGNOSIS — M5412 Radiculopathy, cervical region: Secondary | ICD-10-CM

## 2022-06-04 NOTE — Progress Notes (Signed)
Since he has such high symptom burden would see if he is interested in pulmonix clinical trial biologic for copd.

## 2022-06-04 NOTE — Assessment & Plan Note (Signed)
Mild obstruction on previous pulmonary function testing but with high symptom burden and previous frequent exacerbations, which has improved with Daliresp.  Last flare was October 2023, treated with antibiotics and steroids.  Improved today.  Clinically at his baseline.  Maintained on triple therapy regimen with Breztri.  Patient Instructions  -Continue Breztri 2 puffs twice daily.  Brush tongue and rinse mouth well afterwards. -Continue Albuterol inhaler 2 puffs or 3 mL neb every 6 hours as needed for shortness of breath or wheezing. Notify if symptoms persist despite rescue inhaler/neb use. -Continue Astelin nasal spray 1 spray each nostril twice daily -Continue Zyrtec 10 mg daily -Continue Singulair 10 mg nightly -Continue Flonase 2 sprays each nostril daily as needed for allergies -Continue omeprazole 20 mg daily -Continue Daliresp 500 mcg daily -Continue prednisone 5 mg daily as directed by rheumatology -Continue mucinex 600 mg Twice daily   Follow up in 3 months with [name]. If symptoms do not improve or worsen, please contact office for sooner follow up or seek emergency care.

## 2022-06-04 NOTE — Assessment & Plan Note (Signed)
Followed by Dr. Mechele Dawley.  Currently undergoing treatment.  Considering physical therapy.  Encouraged him to participate in this to help with strengthening.

## 2022-06-04 NOTE — Progress Notes (Signed)
_0  ID: Thomas Hudson, male    DOB: Dec 15, 1940, 81 y.o.   MRN: [id number]  Chief Complaint  Patient presents with   Follow-up    Pt f/u, he reports his breathing is "iffy - he has good and bad days", he states his inhalers are helping.     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  He is a patient of Dr. Mauricio Po and was last seen in office on 01/15/2022.  Past medical history significant for hypertension, venous insufficiency, GERD, BPH, IDA, rheumatoid arthritis.   TEST/EVENTS:  05/20/2016 CT sinuses: Mild mucosal edema throughout the paranasal sinuses.   09/03/2019 CT chest cardiac: Visualized lungs without evidence of acute process or nodules. 07/31/2020 PFT: FVC 73, FEV1 85, ratio 87, TLC 74, DLCOcor 74 02/08/2021 CXR 2 view: Low lung volumes with mild bibasilar atelectasis; otherwise clear.  Cardiomegaly. 04/17/2022 CXR 2 view: the lungs are clear   01/15/2022: OV with [name] for follow-up.  Doing well.  No COPD flares since last appointment in March.  Otherwise usually has 3 to 4-year requiring prednisone.  On Breztri and Daliresp.  Does have a significant allergic component to his flare, especially in the fall.  Previously reviewed blood work and IgE and eos are not significantly elevated.  He is on baseline 5 mg prednisone for his RA.  Taking infusion therapy for his RA at [facility] with Dr. Dossie Der.  Had cataract surgery since last visit.  Vision has improved.  No changes made to regimen.  Follow-up 4 months.  04/17/2022: OV with  NP for acute visit.  He tells me that a little over a week ago, he started coughing more.  Describes his cough as productive with brown sputum.  He has also had some more chest congestion and wheezing.  Slight increase in shortness of breath, mainly with coughing spells.  He does feel like he has some more nasal  congestion than he normally does.  Denies any sick exposures, headaches, fevers, night sweats, chills.  He is currently on Breztri and Daliresp for management of his COPD.  Uses albuterol occasionally.  He is on daily prednisone 5 mg and Cimzia injections for his RA.  He is also on daily doxycycline for acne.  He is supposed to have a T3-T5 minimally invasive fusion next week with Dr. Mechele Dawley.  Wondering if he is okay to move forward with this. Treated for AECOPD with z pack and prednisone. Advised he would need to postpone surgery if symptoms did not improve.  04/22/2022: OV with  NP for follow-up after being treated for AECOPD with Z-Pak and prednisone taper.  He is feeling much better today.  Feels like his chest congestion has improved and his cough has mostly resolved.  He still occasionally gets a throat tickle but otherwise it is cleared up and he is no longer producing large amounts of sputum.  He also has not noticed any more wheezing.  Feels like his breathing is back to his baseline for the most part.  Nasal congestion did improve with the Flonase nasal spray.  Denies any fevers, chills, night sweats, hemoptysis, orthopnea.  He is using Dance movement psychotherapist for management of his COPD.  Finishing up prednisone taper; will turn to his baseline 5 mg daily for his RA.  He is here today to hopefully be cleared for surgery prior to T3-T5 minimally invasive  fusion scheduled for Wednesday, 10/18, with Dr. Mechele Dawley.  06/04/2022: Today - follow up Patient presents today with his wife for follow-up.  Since he was here last, he had his thoracic spine surgery.  Unfortunately, they were unable to do what they wanted with his minimally invasive fusion to the T3 T5 area due to loss of bone mass.  He has also been having some trouble with cervical radiculopathy.  They treated this with epidural injections and or talk about physical therapy.  Does feel like his strength in his neck is getting a little bit better.  From  a respiratory standpoint, his breathing has been stable.  He did feel like he had some increased wheezing last night but used a breathing treatment and this subsided by the time he went to bed.  This morning feels back to his baseline.  Denies any increased cough or chest congestion.  Activity tolerance is the same.  He is using his Breztri twice daily and Daliresp 500 mcg daily.  Still taking Astelin, Zyrtec, Singulair and Flonase.  Remains on chronic daily prednisone 5 mg prescribed by rheumatology.  Allergies  Allergen Reactions   Morphine And Related Other (See Comments)    Itching & rash Because of a history of documented adverse serious drug reaction;Medi Alert bracelet  is recommended    Immunization History  Administered Date(s) Administered   Fluad Quad(high Dose 65+) 05/12/2019, 05/12/2020, 05/22/2022   Influenza Split 06/25/2011, 04/15/2012   Influenza Whole 05/04/2009, 06/06/2010   Influenza, High Dose Seasonal PF 05/03/2013   Influenza, Quadrivalent, Recombinant, Inj, Pf 03/28/2021   Influenza,inj,Quad PF,6+ Mos 04/07/2014, 04/03/2015, 04/19/2016, 05/02/2017   Influenza,inj,Quad PF,6-35 Mos 05/13/2012, 05/31/2013, 04/07/2014   Influenza-Unspecified 06/25/2011, 04/15/2012, 05/13/2012, 04/07/2014, 04/21/2018, 04/11/2021   PFIZER Comirnaty(Gray Top)Covid-19 Tri-Sucrose Vaccine 07/30/2019, 08/20/2019   PFIZER(Purple Top)SARS-COV-2 Vaccination 07/30/2019, 08/20/2019   Pneumococcal Conjugate-13 04/07/2014   Pneumococcal Polysaccharide-23 05/04/2009, 04/07/2014, 05/02/2017   Td 08/08/2010   Td (Adult),5 Lf Tetanus Toxid, Preservative Free 08/08/2010   Unspecified SARS-COV-2 Vaccination 07/30/2019, 08/20/2019   Zoster, Live 08/10/2012    Past Medical History:  Diagnosis Date   BPH (benign prostatic hyperplasia)    Carpal tunnel syndrome on both sides    Complication of anesthesia    Low BP and O2 sats after EGD   COPD (chronic obstructive pulmonary disease) (HCC)    GERD  (gastroesophageal reflux disease)    Hyperlipidemia    Hypertension    RAD (reactive airway disease)    no PMH of asthma; RAD post infection   Rheumatoid arthritis ([place])         Tobacco History: Social History   Tobacco Use  Smoking Status Former   Packs/day: 2.00   Years: 50.00   Total pack years: 100.00   Types: Cigarettes   Quit date: 07/08/1997   Years since quitting: 24.9   Passive exposure: Past  Smokeless Tobacco Never   Counseling given: Not Answered   Outpatient Medications Prior to Visit  Medication Sig Dispense Refill   albuterol (VENTOLIN HFA) 108 (90 Base) MCG/ACT inhaler Inhale 2 puffs into the lungs every 6 (six) hours as needed for wheezing or shortness of breath. 3 each 4   alfuzosin (UROXATRAL) 10 MG 24 hr tablet TAKE 1 TABLET(10 MG) BY MOUTH DAILY 90 tablet 3   aspirin-acetaminophen-caffeine (EXCEDRIN MIGRAINE) 381-829-93 MG tablet Take 2 tablets by mouth 3 (three) times daily as needed for headache.     azelastine (ASTELIN) 0.1 % nasal spray Place 1 spray into both nostrils  2 (two) times daily. Use in each nostril as directed 90 mL 3   betamethasone, augmented, (DIPROLENE) 0.05 % lotion Apply 1 application. topically daily.     BREZTRI AEROSPHERE 160-9-4.8 MCG/ACT AERO Inhale 2 puffs into the lungs 2 (two) times daily. 02.5 g 6   Certolizumab Pegol (CIMZIA) 2 X 200 MG KIT Inject 2 each into the skin every 30 (thirty) days.     cetirizine (ZYRTEC) 10 MG tablet Take 1 tablet (10 mg total) by mouth at bedtime. 90 tablet 3   chlorthalidone (HYGROTON) 25 MG tablet Take 25 mg by mouth daily.     doxycycline (MONODOX) 50 MG capsule Take 50 mg by mouth daily.     erythromycin ophthalmic ointment Place 1 application into both eyes daily as needed (dry eyes).     ferrous sulfate 325 (65 FE) MG tablet Take 1 tablet (325 mg total) by mouth 3 (three) times daily with meals. 270 tablet 3   finasteride (PROSCAR) 5 MG tablet Take 1 tablet (5 mg total) by mouth daily. 90  tablet 3   fluticasone (FLONASE) 50 MCG/ACT nasal spray Place 2 sprays into both nostrils daily as needed for allergies. 48 g 3   folic acid (FOLVITE) 1 MG tablet Take 1 tablet (1 mg total) by mouth daily. 90 tablet 3   gabapentin (NEURONTIN) 300 MG capsule Take 300-600 mg by mouth See admin instructions. Take 1 capsule (300 mg) by mouth in the morning & take 2 capsules (600 mg) by mouth at night.     losartan (COZAAR) 100 MG tablet Take 1 tablet (100 mg total) by mouth daily. 90 tablet 3   montelukast (SINGULAIR) 10 MG tablet Take 1 tablet (10 mg total) by mouth at bedtime. 90 tablet 3   Multiple Vitamin (MULTIVITAMIN WITH MINERALS) TABS tablet Take 1 tablet by mouth daily.     omeprazole (PRILOSEC) 20 MG capsule Take 1 capsule (20 mg total) by mouth daily. 90 capsule 3   potassium chloride SA (KLOR-CON M) 20 MEQ tablet Take 1 tablet (20 mEq total) by mouth daily. 90 tablet 3   predniSONE (DELTASONE) 5 MG tablet Take 1 tablet (5 mg total) by mouth See admin instructions. Take 1 tablet daily 90 tablet 3   roflumilast (DALIRESP) 500 MCG TABS tablet Take 1 tablet (500 mcg total) by mouth daily. 90 tablet 3   rosuvastatin (CRESTOR) 10 MG tablet Take 10 mg by mouth daily.     albuterol (PROVENTIL) (2.5 MG/3ML) 0.083% nebulizer solution Take 3 mLs (2.5 mg total) by nebulization every 6 (six) hours as needed for wheezing or shortness of breath. 360 mL 3   No facility-administered medications prior to visit.     Review of Systems:   Constitutional: No weight loss or gain, night sweats, fevers, chills, fatigue, or lassitude. HEENT: No headaches, difficulty swallowing, tooth/dental problems, or sore throat. No sneezing, itching, ear ache, nasal congestion, or post nasal drip CV:  No chest pain, orthopnea, extremity swelling, PND, anasarca, dizziness, palpitations, syncope Resp: +shortness of breath (baseline); non productive minimal dry cough. No chest congestion. No wheezing. No hemoptysis. No chest  wall deformity GI:  No heartburn, indigestion, abdominal pain, nausea, vomiting, diarrhea, change in bowel habits, loss of appetite, bloody stools.  Skin: No rash, lesions, ulcerations Neuro: No dizziness or lightheadedness.  Psych: No depression or anxiety. Mood stable.     Physical Exam:  BP 122/70   Pulse 81   Ht 5' 7" (1.702 m)   Wt 198  lb 3.2 oz (89.9 kg)   SpO2 95%   BMI 31.04 kg/m   GEN: Pleasant, interactive, well-appearing; elderly; obese; in no acute distress HEENT:  Normocephalic and atraumatic. PERRLA. Sclera white. Nasal turbinates pink, moist and patent bilaterally. No rhinorrhea present. Oropharynx pink and moist, without exudate or edema. No lesions, ulcerations, or postnasal drip.  NECK:  Supple w/ fair ROM. No JVD present. Normal carotid impulses w/o bruits. Thyroid symmetrical with no goiter or nodules palpated. No lymphadenopathy.   CV: RRR, no m/r/g, no peripheral edema. Pulses intact, +2 bilaterally. No cyanosis, pallor or clubbing. PULMONARY:  Unlabored, regular breathing. Clear A&P w/o wheezes/rales/rhonchi. No accessory muscle use. No dullness to percussion. GI: BS present and normoactive. Soft, non-tender to palpation. No organomegaly or masses detected.  MSK: No erythema, warmth or tenderness. No deformities or joint swelling noted.  Neuro: A/Ox3. No focal deficits noted.   Skin: Warm, no lesions or rashe Psych: Normal affect and behavior. Judgement and thought content appropriate.     Lab Results:  CBC    Component Value Date/Time   WBC 6.5 05/09/2022 1717   RBC 4.12 (L) 05/09/2022 1717   HGB 12.7 (L) 05/09/2022 1717   HGB 13.9 02/20/2017 0951   HCT 37.9 (L) 05/09/2022 1717   HCT 41.5 02/20/2017 0951   PLT 328 05/09/2022 1717   PLT 222 02/20/2017 0951   MCV 92.0 05/09/2022 1717   MCV 90.4 02/20/2017 0951   MCH 30.8 05/09/2022 1717   MCHC 33.5 05/09/2022 1717   RDW 12.6 05/09/2022 1717   RDW 14.7 (H) 02/20/2017 0951   LYMPHSABS 0.5 (L)  11/21/2020 1557   LYMPHSABS 0.7 (L) 02/20/2017 0951   MONOABS 0.6 11/21/2020 1557   MONOABS 0.5 02/20/2017 0951   EOSABS 0.1 11/21/2020 1557   EOSABS 0.2 02/20/2017 0951   BASOSABS 0.0 11/21/2020 1557   BASOSABS 0.0 02/20/2017 0951    BMET    Component Value Date/Time   [place] 141 05/22/2022 0935   [place] 141 08/06/2019 1356   K 3.2 (L) 05/22/2022 0935   CL 103 05/22/2022 0935   CO2 29 05/22/2022 0935   GLUCOSE 145 (H) 05/22/2022 0935   BUN 16 05/22/2022 0935   BUN 12 08/06/2019 1356   CREATININE 1.26 05/22/2022 0935   CALCIUM 9.3 05/22/2022 0935   GFRNONAA 35 (L) 05/09/2022 1717   GFRAA 73 08/06/2019 1356    BNP No results found for: "BNP"   Imaging:  CT ANGIO HEAD NECK W WO CM  Result Date: 05/09/2022 CLINICAL DATA:  Initial evaluation for acute onset headache. EXAM: CT ANGIOGRAPHY HEAD AND NECK TECHNIQUE: Multidetector CT imaging of the head and neck was performed using the standard protocol during bolus administration of intravenous contrast. Multiplanar CT image reconstructions and MIPs were obtained to evaluate the vascular anatomy. Carotid stenosis measurements (when applicable) are obtained utilizing NASCET criteria, using the distal internal carotid diameter as the denominator. RADIATION DOSE REDUCTION: This exam was performed according to the departmental dose-optimization program which includes automated exposure control, adjustment of the mA and/or kV according to patient size and/or use of iterative reconstruction technique. CONTRAST:  58m OMNIPAQUE IOHEXOL 350 MG/ML SOLN COMPARISON:  None Available. FINDINGS: CT HEAD FINDINGS Brain: Cerebral volume within normal limits. Patchy and confluent hypodensity involving the supratentorial cerebral white matter, most characteristic of chronic microvascular ischemic disease, moderately advanced in nature. No acute intracranial hemorrhage. No acute cortically based infarct. No mass lesion or midline shift. No hydrocephalus or  extra-axial fluid collection. Vascular:  No abnormal hyperdense vessel. Scattered vascular calcifications noted within the carotid siphons. Skull: Scalp soft tissues and calvarium within normal limits. Sinuses/Orbits: Globes and orbital soft tissues within normal limits. Paranasal sinuses are clear. No mastoid effusion. Other: None. Review of the MIP images confirms the above findings CTA NECK FINDINGS Aortic arch: Visualized aortic arch normal caliber with standard branch pattern. Mild atheromatous change about the arch itself. No significant stenosis. Right carotid system: Right common and internal carotid arteries patent without dissection or occlusion. Calcified plaque about the proximal right ICA with associated stenosis of up to 40-50% by NASCET criteria. Left carotid system: Left common and internal carotid arteries patent without dissection or occlusion. Calcified plaque about the left carotid bulb/proximal left ICA with associated stenosis of up to 60-70% by NASCET criteria. Vertebral arteries: Both vertebral arteries arise from subclavian arteries. Atheromatous change at the proximal left subclavian artery with associated stenosis of up to 40% (series 10, image 307). Vertebral arteries M cells patent without stenosis or dissection. Right vertebral artery dominant. Skeleton: No discrete or worrisome osseous lesions. Moderate spondylosis present at C5-6 and C6-7. Other neck: No other acute soft tissue abnormality within the neck. Upper chest: Visualized upper chest demonstrates no acute finding. Paraseptal emphysema noted. Review of the MIP images confirms the above findings CTA HEAD FINDINGS Anterior circulation: Petrous segments widely patent. Atheromatous change within the carotid siphons with associated mild narrowing on the right and up to moderate stenosis on the left. A1 segments patent. Normal anterior communicating artery complex. Anterior cerebral arteries widely patent. No M1 stenosis or occlusion.  Normal MCA bifurcations. No proximal MCA branch occlusion. Distal MCA branches perfused and symmetric. Posterior circulation: Both V4 segments widely patent. Left PICA patent. Right PICA origin not well seen. Basilar widely patent to its distal aspect. Superior cerebellar and posterior cerebral arteries patent bilaterally. Venous sinuses: Patent allowing for timing the contrast bolus. Anatomic variants: None significant.  No aneurysm. Review of the MIP images confirms the above findings IMPRESSION: CT HEAD IMPRESSION: 1. No acute intracranial abnormality. 2. Moderate chronic microvascular ischemic disease. CTA HEAD AND NECK IMPRESSION: 1. Negative CTA for large vessel occlusion or other emergent finding. No aneurysm. 2. Calcified plaque about the proximal ICAs with associated stenoses of up to 40-50% on the right and 60-70% on the left. 3. Atheromatous change within the carotid siphons with associated mild to moderate narrowing, left worse than right. 4. 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 Aortic Atherosclerosis (ICD10-I70.0) and Emphysema (ICD10-J43.9). Electronically Signed   By: Jeannine Boga M.D.   On: 05/09/2022 21:11         Latest Ref Rng & Units 07/31/2020   12:51 PM  PFT Results  FVC-Pre L 2.70   FVC-Predicted Pre % 73   FVC-Post L 2.56   FVC-Predicted Post % 69   Pre FEV1/FVC % % 83   Post FEV1/FCV % % 87   FEV1-Pre L 2.24   FEV1-Predicted Pre % 85   FEV1-Post L 2.24   DLCO uncorrected ml/min/mmHg 16.91   DLCO UNC% % 74   DLCO corrected ml/min/mmHg 16.91   DLCO COR %Predicted % 74   DLVA Predicted % 123   TLC L 4.78   TLC % Predicted % 74   RV % Predicted % 83     Lab Results  Component Value Date   NITRICOXIDE 9 05/28/2016        Assessment & Plan:   COPD with chronic bronchitis and emphysema (HCC) Mild  obstruction on previous pulmonary function testing but with high symptom burden and previous frequent exacerbations, which has improved with  Daliresp.  Last flare was October 2023, treated with antibiotics and steroids.  Improved today.  Clinically at his baseline.  Maintained on triple therapy regimen with Breztri.  Patient Instructions  -Continue Breztri 2 puffs twice daily.  Brush tongue and rinse mouth well afterwards. -Continue Albuterol inhaler 2 puffs or 3 mL neb every 6 hours as needed for shortness of breath or wheezing. Notify if symptoms persist despite rescue inhaler/neb use. -Continue Astelin nasal spray 1 spray each nostril twice daily -Continue Zyrtec 10 mg daily -Continue Singulair 10 mg nightly -Continue Flonase 2 sprays each nostril daily as needed for allergies -Continue omeprazole 20 mg daily -Continue Daliresp 500 mcg daily -Continue prednisone 5 mg daily as directed by rheumatology -Continue mucinex 600 mg Twice daily   Follow up in 3 months with [name]. If symptoms do not improve or worsen, please contact office for sooner follow up or seek emergency care.    Allergic rhinitis due to pollen Stable.  Well-controlled on Zyrtec, Singulair, Flonase, Astelin.  Cervical radiculopathy Followed by Dr. Mechele Dawley.  Currently undergoing treatment.  Considering physical therapy.  Encouraged him to participate in this to help with strengthening.    I spent 28 minutes of dedicated to the care of this patient on the date of this encounter to include pre-visit review of records, face-to-face time with the patient discussing conditions above, post visit ordering of testing, clinical documentation with the electronic health record, making appropriate referrals as documented, and communicating necessary findings to members of the patients care team.   Clayton Bibles, NP 06/04/2022  Pt aware and understands NP's role.

## 2022-06-04 NOTE — Assessment & Plan Note (Signed)
Stable.  Well-controlled on Zyrtec, Singulair, Flonase, Astelin.

## 2022-06-04 NOTE — Patient Instructions (Signed)
-  Continue Breztri 2 puffs twice daily.  Brush tongue and rinse mouth well afterwards. -Continue Albuterol inhaler 2 puffs or 3 mL neb every 6 hours as needed for shortness of breath or wheezing. Notify if symptoms persist despite rescue inhaler/neb use. -Continue Astelin nasal spray 1 spray each nostril twice daily -Continue Zyrtec 10 mg daily -Continue Singulair 10 mg nightly -Continue Flonase 2 sprays each nostril daily as needed for allergies -Continue omeprazole 20 mg daily -Continue Daliresp 500 mcg daily -Continue prednisone 5 mg daily as directed by rheumatology -Continue mucinex 600 mg Twice daily   Follow up in 3 months with [name]. If symptoms do not improve or worsen, please contact office for sooner follow up or seek emergency care.

## 2022-06-06 DIAGNOSIS — M5136 Other intervertebral disc degeneration, lumbar region: Secondary | ICD-10-CM | POA: Diagnosis not present

## 2022-06-06 DIAGNOSIS — M5030 Other cervical disc degeneration, unspecified cervical region: Secondary | ICD-10-CM | POA: Diagnosis not present

## 2022-06-10 NOTE — Telephone Encounter (Signed)
Left message to call office

## 2022-06-12 ENCOUNTER — Telehealth: Payer: Self-pay | Admitting: Internal Medicine

## 2022-06-12 ENCOUNTER — Encounter: Payer: Self-pay | Admitting: Internal Medicine

## 2022-06-12 DIAGNOSIS — I10 Essential (primary) hypertension: Secondary | ICD-10-CM

## 2022-06-12 DIAGNOSIS — M0579 Rheumatoid arthritis with rheumatoid factor of multiple sites without organ or systems involvement: Secondary | ICD-10-CM | POA: Diagnosis not present

## 2022-06-12 MED ORDER — LOSARTAN POTASSIUM 100 MG PO TABS: 100.0000 mg | ORAL_TABLET | Freq: Every day | ORAL | 3 refills | Status: DC

## 2022-06-12 NOTE — Telephone Encounter (Signed)
Patient daughter called to discus labs results and wanted a call back tomorrow and stated she is busy for the rest of the day today. Call back number [phone].

## 2022-06-13 ENCOUNTER — Other Ambulatory Visit: Payer: Self-pay | Admitting: Internal Medicine

## 2022-06-13 MED ORDER — POTASSIUM CHLORIDE CRYS ER 20 MEQ PO TBCR: 20.0000 meq | EXTENDED_RELEASE_TABLET | Freq: Two times a day (BID) | ORAL | 0 refills | Status: DC

## 2022-06-13 NOTE — Addendum Note (Signed)
Addended by: Viviana Simpler I on: 06/13/2022 12:57 PM   Modules accepted: Orders

## 2022-06-13 NOTE — Telephone Encounter (Signed)
See previous message

## 2022-06-13 NOTE — Telephone Encounter (Signed)
Patient daughter called today to talk about lab results.

## 2022-06-13 NOTE — Telephone Encounter (Signed)
Spoke to pt's daughter, Anderson [place], per [place]. Sent 30 day supply of potassium to [facility]. Made lab appt for 07-18-22.   Please place future order for labs.

## 2022-06-25 DIAGNOSIS — Z7409 Other reduced mobility: Secondary | ICD-10-CM | POA: Diagnosis not present

## 2022-06-25 DIAGNOSIS — M542 Cervicalgia: Secondary | ICD-10-CM | POA: Diagnosis not present

## 2022-06-25 DIAGNOSIS — G8929 Other chronic pain: Secondary | ICD-10-CM | POA: Diagnosis not present

## 2022-06-25 DIAGNOSIS — M5442 Lumbago with sciatica, left side: Secondary | ICD-10-CM | POA: Diagnosis not present

## 2022-06-25 DIAGNOSIS — M5441 Lumbago with sciatica, right side: Secondary | ICD-10-CM | POA: Diagnosis not present

## 2022-06-25 DIAGNOSIS — Z789 Other specified health status: Secondary | ICD-10-CM | POA: Diagnosis not present

## 2022-06-27 DIAGNOSIS — M5442 Lumbago with sciatica, left side: Secondary | ICD-10-CM | POA: Diagnosis not present

## 2022-06-27 DIAGNOSIS — M5441 Lumbago with sciatica, right side: Secondary | ICD-10-CM | POA: Diagnosis not present

## 2022-06-27 DIAGNOSIS — M542 Cervicalgia: Secondary | ICD-10-CM | POA: Diagnosis not present

## 2022-06-27 DIAGNOSIS — G8929 Other chronic pain: Secondary | ICD-10-CM | POA: Diagnosis not present

## 2022-06-27 DIAGNOSIS — Z7409 Other reduced mobility: Secondary | ICD-10-CM | POA: Diagnosis not present

## 2022-06-27 DIAGNOSIS — Z789 Other specified health status: Secondary | ICD-10-CM | POA: Diagnosis not present

## 2022-07-09 DIAGNOSIS — Z7409 Other reduced mobility: Secondary | ICD-10-CM | POA: Diagnosis not present

## 2022-07-09 DIAGNOSIS — M542 Cervicalgia: Secondary | ICD-10-CM | POA: Diagnosis not present

## 2022-07-09 DIAGNOSIS — M5441 Lumbago with sciatica, right side: Secondary | ICD-10-CM | POA: Diagnosis not present

## 2022-07-09 DIAGNOSIS — Z789 Other specified health status: Secondary | ICD-10-CM | POA: Diagnosis not present

## 2022-07-09 DIAGNOSIS — M48061 Spinal stenosis, lumbar region without neurogenic claudication: Secondary | ICD-10-CM | POA: Diagnosis not present

## 2022-07-09 DIAGNOSIS — G8929 Other chronic pain: Secondary | ICD-10-CM | POA: Diagnosis not present

## 2022-07-09 DIAGNOSIS — M5442 Lumbago with sciatica, left side: Secondary | ICD-10-CM | POA: Diagnosis not present

## 2022-07-10 DIAGNOSIS — M0579 Rheumatoid arthritis with rheumatoid factor of multiple sites without organ or systems involvement: Secondary | ICD-10-CM | POA: Diagnosis not present

## 2022-07-11 ENCOUNTER — Encounter: Payer: Self-pay | Admitting: Oncology

## 2022-07-15 DIAGNOSIS — M5442 Lumbago with sciatica, left side: Secondary | ICD-10-CM | POA: Diagnosis not present

## 2022-07-15 DIAGNOSIS — M542 Cervicalgia: Secondary | ICD-10-CM | POA: Diagnosis not present

## 2022-07-15 DIAGNOSIS — Z789 Other specified health status: Secondary | ICD-10-CM | POA: Diagnosis not present

## 2022-07-15 DIAGNOSIS — G8929 Other chronic pain: Secondary | ICD-10-CM | POA: Diagnosis not present

## 2022-07-15 DIAGNOSIS — M5441 Lumbago with sciatica, right side: Secondary | ICD-10-CM | POA: Diagnosis not present

## 2022-07-15 DIAGNOSIS — Z7409 Other reduced mobility: Secondary | ICD-10-CM | POA: Diagnosis not present

## 2022-07-15 DIAGNOSIS — M48061 Spinal stenosis, lumbar region without neurogenic claudication: Secondary | ICD-10-CM | POA: Diagnosis not present

## 2022-07-18 ENCOUNTER — Other Ambulatory Visit (INDEPENDENT_AMBULATORY_CARE_PROVIDER_SITE_OTHER): Payer: Medicare PPO

## 2022-07-18 ENCOUNTER — Telehealth: Payer: Self-pay | Admitting: Internal Medicine

## 2022-07-18 DIAGNOSIS — K219 Gastro-esophageal reflux disease without esophagitis: Secondary | ICD-10-CM

## 2022-07-18 DIAGNOSIS — E876 Hypokalemia: Secondary | ICD-10-CM | POA: Diagnosis not present

## 2022-07-18 DIAGNOSIS — I10 Essential (primary) hypertension: Secondary | ICD-10-CM

## 2022-07-18 DIAGNOSIS — J302 Other seasonal allergic rhinitis: Secondary | ICD-10-CM

## 2022-07-18 LAB — RENAL FUNCTION PANEL
Albumin: 3.9 g/dL (ref 3.5–5.2)
BUN: 17 mg/dL (ref 6–23)
CO2: 28 mEq/L (ref 19–32)
Calcium: 9.5 mg/dL (ref 8.4–10.5)
Chloride: 102 mEq/L (ref 96–112)
Creatinine, Ser: 1.39 mg/dL (ref 0.40–1.50)
GFR: 48.21 mL/min — ABNORMAL LOW (ref >60.00)
Glucose, Bld: 126 mg/dL — ABNORMAL HIGH (ref 70–99)
Phosphorus: 3.5 mg/dL (ref 2.3–4.6)
Potassium: 3 mEq/L — ABNORMAL LOW (ref 3.5–5.1)
Sodium: 139 mEq/L (ref 135–145)

## 2022-07-18 NOTE — Telephone Encounter (Signed)
Spoke to pt's daughter who will have pt call me.

## 2022-07-18 NOTE — Telephone Encounter (Signed)
Patient dropped off document to be filled out by provider  medication refills . Patient requested to send it via Fax within ASAP. Document is located in providers tray at front office.  Send to [facility]

## 2022-07-19 DIAGNOSIS — M5441 Lumbago with sciatica, right side: Secondary | ICD-10-CM | POA: Diagnosis not present

## 2022-07-19 DIAGNOSIS — M542 Cervicalgia: Secondary | ICD-10-CM | POA: Diagnosis not present

## 2022-07-19 DIAGNOSIS — M48061 Spinal stenosis, lumbar region without neurogenic claudication: Secondary | ICD-10-CM | POA: Diagnosis not present

## 2022-07-19 DIAGNOSIS — G8929 Other chronic pain: Secondary | ICD-10-CM | POA: Diagnosis not present

## 2022-07-19 DIAGNOSIS — M5442 Lumbago with sciatica, left side: Secondary | ICD-10-CM | POA: Diagnosis not present

## 2022-07-19 DIAGNOSIS — Z789 Other specified health status: Secondary | ICD-10-CM | POA: Diagnosis not present

## 2022-07-19 DIAGNOSIS — Z7409 Other reduced mobility: Secondary | ICD-10-CM | POA: Diagnosis not present

## 2022-07-19 MED ORDER — OMEPRAZOLE 20 MG PO CPDR: 20.0000 mg | DELAYED_RELEASE_CAPSULE | Freq: Every day | ORAL | 3 refills | Status: DC

## 2022-07-19 MED ORDER — MONTELUKAST SODIUM 10 MG PO TABS: 10.0000 mg | ORAL_TABLET | Freq: Every day | ORAL | 3 refills | Status: DC

## 2022-07-19 MED ORDER — CETIRIZINE HCL 10 MG PO TABS: 10.0000 mg | ORAL_TABLET | Freq: Every day | ORAL | 3 refills | Status: DC

## 2022-07-19 MED ORDER — LOSARTAN POTASSIUM 100 MG PO TABS: 100.0000 mg | ORAL_TABLET | Freq: Every day | ORAL | 3 refills | Status: DC

## 2022-07-19 MED ORDER — ALFUZOSIN HCL ER 10 MG PO TB24: ORAL_TABLET | ORAL | 3 refills | Status: DC

## 2022-07-19 MED ORDER — POTASSIUM CHLORIDE CRYS ER 20 MEQ PO TBCR: 20.0000 meq | EXTENDED_RELEASE_TABLET | Freq: Two times a day (BID) | ORAL | 3 refills | Status: DC

## 2022-07-19 MED ORDER — FERROUS SULFATE 325 (65 FE) MG PO TABS: 325.0000 mg | ORAL_TABLET | Freq: Three times a day (TID) | ORAL | 3 refills | Status: DC

## 2022-07-19 MED ORDER — FINASTERIDE 5 MG PO TABS: 5.0000 mg | ORAL_TABLET | Freq: Every day | ORAL | 3 refills | Status: DC

## 2022-07-19 NOTE — Addendum Note (Signed)
Addended by: Pilar Grammes on: 07/19/2022 09:42 AM   Modules accepted: Orders

## 2022-07-19 NOTE — Telephone Encounter (Signed)
All rxs prescribed by Dr Silvio Pate were sent to [facility] except for losartan as it has refills to late June.

## 2022-07-25 DIAGNOSIS — M542 Cervicalgia: Secondary | ICD-10-CM | POA: Diagnosis not present

## 2022-07-25 DIAGNOSIS — Z789 Other specified health status: Secondary | ICD-10-CM | POA: Diagnosis not present

## 2022-07-25 DIAGNOSIS — M5442 Lumbago with sciatica, left side: Secondary | ICD-10-CM | POA: Diagnosis not present

## 2022-07-25 DIAGNOSIS — M5441 Lumbago with sciatica, right side: Secondary | ICD-10-CM | POA: Diagnosis not present

## 2022-07-25 DIAGNOSIS — G8929 Other chronic pain: Secondary | ICD-10-CM | POA: Diagnosis not present

## 2022-07-25 DIAGNOSIS — Z7409 Other reduced mobility: Secondary | ICD-10-CM | POA: Diagnosis not present

## 2022-07-26 DIAGNOSIS — Z7409 Other reduced mobility: Secondary | ICD-10-CM | POA: Diagnosis not present

## 2022-07-26 DIAGNOSIS — M5441 Lumbago with sciatica, right side: Secondary | ICD-10-CM | POA: Diagnosis not present

## 2022-07-26 DIAGNOSIS — M5442 Lumbago with sciatica, left side: Secondary | ICD-10-CM | POA: Diagnosis not present

## 2022-07-26 DIAGNOSIS — Z789 Other specified health status: Secondary | ICD-10-CM | POA: Diagnosis not present

## 2022-07-26 DIAGNOSIS — M542 Cervicalgia: Secondary | ICD-10-CM | POA: Diagnosis not present

## 2022-07-26 DIAGNOSIS — G8929 Other chronic pain: Secondary | ICD-10-CM | POA: Diagnosis not present

## 2022-07-26 DIAGNOSIS — M48061 Spinal stenosis, lumbar region without neurogenic claudication: Secondary | ICD-10-CM | POA: Diagnosis not present

## 2022-07-29 DIAGNOSIS — M5442 Lumbago with sciatica, left side: Secondary | ICD-10-CM | POA: Diagnosis not present

## 2022-07-29 DIAGNOSIS — M542 Cervicalgia: Secondary | ICD-10-CM | POA: Diagnosis not present

## 2022-07-29 DIAGNOSIS — Z789 Other specified health status: Secondary | ICD-10-CM | POA: Diagnosis not present

## 2022-07-29 DIAGNOSIS — Z7409 Other reduced mobility: Secondary | ICD-10-CM | POA: Diagnosis not present

## 2022-07-29 DIAGNOSIS — M5441 Lumbago with sciatica, right side: Secondary | ICD-10-CM | POA: Diagnosis not present

## 2022-07-29 DIAGNOSIS — M48061 Spinal stenosis, lumbar region without neurogenic claudication: Secondary | ICD-10-CM | POA: Diagnosis not present

## 2022-07-29 DIAGNOSIS — G8929 Other chronic pain: Secondary | ICD-10-CM | POA: Diagnosis not present

## 2022-07-30 DIAGNOSIS — M25561 Pain in right knee: Secondary | ICD-10-CM | POA: Diagnosis not present

## 2022-07-30 DIAGNOSIS — M150 Primary generalized (osteo)arthritis: Secondary | ICD-10-CM | POA: Diagnosis not present

## 2022-07-30 DIAGNOSIS — M25552 Pain in left hip: Secondary | ICD-10-CM | POA: Diagnosis not present

## 2022-07-30 DIAGNOSIS — M25569 Pain in unspecified knee: Secondary | ICD-10-CM | POA: Diagnosis not present

## 2022-07-30 DIAGNOSIS — M25562 Pain in left knee: Secondary | ICD-10-CM | POA: Diagnosis not present

## 2022-07-30 DIAGNOSIS — M0600 Rheumatoid arthritis without rheumatoid factor, unspecified site: Secondary | ICD-10-CM | POA: Diagnosis not present

## 2022-07-30 DIAGNOSIS — M1990 Unspecified osteoarthritis, unspecified site: Secondary | ICD-10-CM | POA: Diagnosis not present

## 2022-07-30 DIAGNOSIS — M25559 Pain in unspecified hip: Secondary | ICD-10-CM | POA: Diagnosis not present

## 2022-07-30 DIAGNOSIS — Z79899 Other long term (current) drug therapy: Secondary | ICD-10-CM | POA: Diagnosis not present

## 2022-07-30 DIAGNOSIS — M5136 Other intervertebral disc degeneration, lumbar region: Secondary | ICD-10-CM | POA: Diagnosis not present

## 2022-07-30 DIAGNOSIS — M25551 Pain in right hip: Secondary | ICD-10-CM | POA: Diagnosis not present

## 2022-07-31 DIAGNOSIS — M5442 Lumbago with sciatica, left side: Secondary | ICD-10-CM | POA: Diagnosis not present

## 2022-07-31 DIAGNOSIS — M5441 Lumbago with sciatica, right side: Secondary | ICD-10-CM | POA: Diagnosis not present

## 2022-07-31 DIAGNOSIS — G8929 Other chronic pain: Secondary | ICD-10-CM | POA: Diagnosis not present

## 2022-07-31 DIAGNOSIS — M542 Cervicalgia: Secondary | ICD-10-CM | POA: Diagnosis not present

## 2022-07-31 DIAGNOSIS — M48061 Spinal stenosis, lumbar region without neurogenic claudication: Secondary | ICD-10-CM | POA: Diagnosis not present

## 2022-07-31 DIAGNOSIS — Z789 Other specified health status: Secondary | ICD-10-CM | POA: Diagnosis not present

## 2022-07-31 DIAGNOSIS — Z7409 Other reduced mobility: Secondary | ICD-10-CM | POA: Diagnosis not present

## 2022-08-01 DIAGNOSIS — R102 Pelvic and perineal pain: Secondary | ICD-10-CM | POA: Diagnosis not present

## 2022-08-01 DIAGNOSIS — M160 Bilateral primary osteoarthritis of hip: Secondary | ICD-10-CM | POA: Diagnosis not present

## 2022-08-01 DIAGNOSIS — Z885 Allergy status to narcotic agent status: Secondary | ICD-10-CM | POA: Diagnosis not present

## 2022-08-01 DIAGNOSIS — S3993XA Unspecified injury of pelvis, initial encounter: Secondary | ICD-10-CM | POA: Diagnosis not present

## 2022-08-01 DIAGNOSIS — M8588 Other specified disorders of bone density and structure, other site: Secondary | ICD-10-CM | POA: Diagnosis not present

## 2022-08-07 ENCOUNTER — Encounter: Payer: Self-pay | Admitting: Internal Medicine

## 2022-08-07 MED ORDER — AZELASTINE HCL 0.1 % NA SOLN: 1.0000 | Freq: Two times a day (BID) | NASAL | 3 refills | Status: DC

## 2022-08-07 MED ORDER — BREZTRI AEROSPHERE 160-9-4.8 MCG/ACT IN AERO: 2.0000 | INHALATION_SPRAY | Freq: Two times a day (BID) | RESPIRATORY_TRACT | 3 refills | Status: DC

## 2022-08-07 MED ORDER — FLUTICASONE PROPIONATE 50 MCG/ACT NA SUSP: 2.0000 | Freq: Every day | NASAL | 3 refills | Status: DC | PRN

## 2022-08-07 NOTE — Telephone Encounter (Signed)
Rx sent electronically 

## 2022-08-12 ENCOUNTER — Telehealth: Payer: Self-pay | Admitting: Internal Medicine

## 2022-08-12 NOTE — Telephone Encounter (Signed)
ATC LMTCB 08/12/22 @ 4:17 PM will also send Mychart message as well.

## 2022-08-13 ENCOUNTER — Encounter: Payer: Self-pay | Admitting: Pulmonary Disease

## 2022-08-13 ENCOUNTER — Ambulatory Visit (INDEPENDENT_AMBULATORY_CARE_PROVIDER_SITE_OTHER): Payer: Medicare PPO | Admitting: Pulmonary Disease

## 2022-08-13 ENCOUNTER — Ambulatory Visit (INDEPENDENT_AMBULATORY_CARE_PROVIDER_SITE_OTHER): Payer: Medicare PPO

## 2022-08-13 VITALS — BP 126/72 | HR 80 | Temp 98.3°F | Ht 67.0 in | Wt 202.6 lb

## 2022-08-13 DIAGNOSIS — M06049 Rheumatoid arthritis without rheumatoid factor, unspecified hand: Secondary | ICD-10-CM | POA: Diagnosis not present

## 2022-08-13 DIAGNOSIS — R0602 Shortness of breath: Secondary | ICD-10-CM

## 2022-08-13 DIAGNOSIS — R051 Acute cough: Secondary | ICD-10-CM

## 2022-08-13 DIAGNOSIS — J441 Chronic obstructive pulmonary disease with (acute) exacerbation: Secondary | ICD-10-CM

## 2022-08-13 DIAGNOSIS — R059 Cough, unspecified: Secondary | ICD-10-CM | POA: Diagnosis not present

## 2022-08-13 DIAGNOSIS — D849 Immunodeficiency, unspecified: Secondary | ICD-10-CM

## 2022-08-13 DIAGNOSIS — R0600 Dyspnea, unspecified: Secondary | ICD-10-CM | POA: Diagnosis not present

## 2022-08-13 MED ORDER — AMOXICILLIN-POT CLAVULANATE 875-125 MG PO TABS: 1.0000 | ORAL_TABLET | Freq: Two times a day (BID) | ORAL | 0 refills | Status: DC

## 2022-08-13 MED ORDER — PREDNISONE 20 MG PO TABS: 40.0000 mg | ORAL_TABLET | Freq: Every day | ORAL | 0 refills | Status: DC

## 2022-08-13 NOTE — Progress Notes (Signed)
Synopsis: Patient of Dr. Lenice Llamas with severe COPD, recurrent exacerbations Has rheumatoid arthritis treated with certrolizumab.   Subjective:   PATIENT ID: Thomas Hudson GENDER: male DOB: Nov 11, 1940, MRN: [id number]   HPI  Chief Complaint  Patient presents with   Acute Visit    Pt has had complaints of a cough and wheezing which began 4 days ago. Pt is coughing up thick phlegm that is yellow-green in color. Also has had associated SOB with symptoms.    Mr. Gilkeson says that Thursday of last week he developed hoarseness and congestion in his sinuses and then he started wheezing and coughing.  His family was with him over the weekend and noticed that symptoms were worsening.  He says that that he has been compliant with his Breztri.  Some increased dyspnea.   He had a root canal last Wednesday, was treated with amoxicillin '500mg'$  po tid x 10 days: he's still taking that right now.    He continues to take certrolizumab injections.     Record review: Multiple visits with our nurse practitioners in October, November for COPD exacerbations.  Past Medical History:  Diagnosis Date   BPH (benign prostatic hyperplasia)    Carpal tunnel syndrome on both sides    Complication of anesthesia    Low BP and O2 sats after EGD   COPD (chronic obstructive pulmonary disease) (HCC)    GERD (gastroesophageal reflux disease)    Hyperlipidemia    Hypertension    RAD (reactive airway disease)    no PMH of asthma; RAD post infection   Rheumatoid arthritis ([place])          Family History  Problem Relation Age of Onset   Skin cancer Mother    Throat cancer Father        smoker   Esophageal cancer Father    Uterine cancer Sister    COPD Sister        X2   Ovarian cancer Sister    Throat cancer Sister        smoker   Breast cancer Sister    Lung cancer Sister        smoker   Leukemia Sister    Coronary artery disease Brother        in 41s   Lung cancer Brother        kidney cancer    Throat cancer Brother    Heart disease Brother    Heart disease Brother    Heart disease Maternal Grandfather    Stroke Paternal Grandmother 94   Colon cancer Paternal Grandfather    Heart disease Paternal Grandfather    Prostate cancer Paternal Uncle    Heart attack Paternal Uncle        3 uncles, >55   Asthma Neg Hx      Social History   Socioeconomic History   Marital status: Married    Spouse name: Not on file   Number of children: 2   Years of education: Not on file   Highest education level: Not on file  Occupational History   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[employer]    Comment: Retired  Tobacco Use   Smoking status: Former    Packs/day: 2.00    Years: 50.00    Total pack years: 100.00    Types: Cigarettes    Quit date: 07/08/1997    Years since quitting: 25.1    Passive exposure: Past   Smokeless tobacco: Never  Vaping  Use   Vaping Use: Never used  Substance and Sexual Activity   Alcohol use: No    Alcohol/week: 0.0 standard drinks of alcohol   Drug use: No   Sexual activity: Yes  Other Topics Concern   Not on file  Social History Narrative   Has living will   Requests daughters as health care POA   Would accept resuscitation attempts   Not sure about tube feeds--would leave to daughters   Social Determinants of Health   Financial Resource Strain: Not on file  Food Insecurity: Not on file  Transportation Needs: Not on file  Physical Activity: Not on file  Stress: Not on file  Social Connections: Not on file  Intimate Partner Violence: Not on file     Allergies  Allergen Reactions   Morphine And Related Other (See Comments)    Itching & rash Because of a history of documented adverse serious drug reaction;Medi Alert bracelet  is recommended     Outpatient Medications Prior to Visit  Medication Sig Dispense Refill   albuterol (VENTOLIN HFA) 108 (90 Base) MCG/ACT inhaler Inhale 2 puffs into the lungs every 6 (six) hours as needed for wheezing  or shortness of breath. 3 each 4   alfuzosin (UROXATRAL) 10 MG 24 hr tablet TAKE 1 TABLET(10 MG) BY MOUTH DAILY 90 tablet 3   aspirin-acetaminophen-caffeine (EXCEDRIN MIGRAINE) 939-030-09 MG tablet Take 2 tablets by mouth 3 (three) times daily as needed for headache.     azelastine (ASTELIN) 0.1 % nasal spray Place 1 spray into both nostrils 2 (two) times daily. Use in each nostril as directed 90 mL 3   betamethasone, augmented, (DIPROLENE) 0.05 % lotion Apply 1 application. topically daily.     BREZTRI AEROSPHERE 160-9-4.8 MCG/ACT AERO Inhale 2 puffs into the lungs 2 (two) times daily. 23.3 g 3   Certolizumab Pegol (CIMZIA) 2 X 200 MG KIT Inject 2 each into the skin every 30 (thirty) days.     cetirizine (ZYRTEC) 10 MG tablet Take 1 tablet (10 mg total) by mouth at bedtime. 90 tablet 3   chlorthalidone (HYGROTON) 25 MG tablet Take 25 mg by mouth daily.     doxycycline (MONODOX) 50 MG capsule Take 50 mg by mouth daily.     erythromycin ophthalmic ointment Place 1 application into both eyes daily as needed (dry eyes).     ferrous sulfate 325 (65 FE) MG tablet Take 1 tablet (325 mg total) by mouth 3 (three) times daily with meals. 270 tablet 3   finasteride (PROSCAR) 5 MG tablet Take 1 tablet (5 mg total) by mouth daily. 90 tablet 3   fluticasone (FLONASE) 50 MCG/ACT nasal spray Place 2 sprays into both nostrils daily as needed for allergies. 48 g 3   folic acid (FOLVITE) 1 MG tablet Take 1 tablet (1 mg total) by mouth daily. 90 tablet 3   gabapentin (NEURONTIN) 300 MG capsule Take 300-600 mg by mouth See admin instructions. Take 1 capsule (300 mg) by mouth in the morning & take 2 capsules (600 mg) by mouth at night.     losartan (COZAAR) 100 MG tablet Take 1 tablet (100 mg total) by mouth daily. 90 tablet 3   montelukast (SINGULAIR) 10 MG tablet Take 1 tablet (10 mg total) by mouth at bedtime. 90 tablet 3   Multiple Vitamin (MULTIVITAMIN WITH MINERALS) TABS tablet Take 1 tablet by mouth daily.      omeprazole (PRILOSEC) 20 MG capsule Take 1 capsule (20 mg total) by  mouth daily. 90 capsule 3   potassium chloride SA (KLOR-CON M) 20 MEQ tablet Take 1 tablet (20 mEq total) by mouth 2 (two) times daily. 180 tablet 3   predniSONE (DELTASONE) 5 MG tablet Take 1 tablet (5 mg total) by mouth See admin instructions. Take 1 tablet daily 90 tablet 3   roflumilast (DALIRESP) 500 MCG TABS tablet Take 1 tablet (500 mcg total) by mouth daily. 90 tablet 3   rosuvastatin (CRESTOR) 10 MG tablet Take 10 mg by mouth daily.     albuterol (PROVENTIL) (2.5 MG/3ML) 0.083% nebulizer solution Take 3 mLs (2.5 mg total) by nebulization every 6 (six) hours as needed for wheezing or shortness of breath. 360 mL 3   No facility-administered medications prior to visit.    Review of Systems  Constitutional:  Positive for malaise/fatigue. Negative for chills, fever and weight loss.  HENT:  Negative for congestion, sinus pain and sore throat.   Respiratory:  Positive for cough, sputum production and shortness of breath.   Cardiovascular:  Negative for chest pain and leg swelling.      Objective:  Physical Exam   Vitals:   08/13/22 1421  BP: 126/72  Pulse: 80  Temp: 98.3 F (36.8 C)  TempSrc: Oral  SpO2: 99%  Weight: 202 lb 9.6 oz (91.9 kg)  Height: '5\' 7"'$  (1.702 m)    Gen: mildly ill appearing HENT: OP clear, neck supple PULM: wheezing/rhonchi B, normal effort  CV: RRR, no mgr GI: BS+, soft, nontender Derm: no cyanosis or rash Psyche: normal mood and affect   CBC    Component Value Date/Time   WBC 6.5 05/09/2022 1717   RBC 4.12 (L) 05/09/2022 1717   HGB 12.7 (L) 05/09/2022 1717   HGB 13.9 02/20/2017 0951   HCT 37.9 (L) 05/09/2022 1717   HCT 41.5 02/20/2017 0951   PLT 328 05/09/2022 1717   PLT 222 02/20/2017 0951   MCV 92.0 05/09/2022 1717   MCV 90.4 02/20/2017 0951   MCH 30.8 05/09/2022 1717   MCHC 33.5 05/09/2022 1717   RDW 12.6 05/09/2022 1717   RDW 14.7 (H) 02/20/2017 0951   LYMPHSABS  0.5 (L) 11/21/2020 1557   LYMPHSABS 0.7 (L) 02/20/2017 0951   MONOABS 0.6 11/21/2020 1557   MONOABS 0.5 02/20/2017 0951   EOSABS 0.1 11/21/2020 1557   EOSABS 0.2 02/20/2017 0951   BASOSABS 0.0 11/21/2020 1557   BASOSABS 0.0 02/20/2017 0951     Chest imaging: October 2023 two-view chest x-ray images independently reviewed showing normal pulmonary parenchyma  PFT: 2022 full PFT showed mild restriction, no airflow obstruction, diffusion capacity mildly reduced  Labs:  Path:  Echo:  Heart Catheterization:       Assessment & Plan:   Acute cough - Plan: DG Chest 2 View  Shortness of breath - Plan: DG Chest 2 View  COPD with acute exacerbation (HCC)  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 Zenia Resides has recurrent exacerbations of COPD, now experiencing the same while on antibiotics for dental surgery last week.  Of note, he is on an immunomodulator for rheumatoid arthritis.  I wonder if this could be contributing as there is a 38% likelihood of infections while on this medicine, up to 20% risk of recurrent respiratory infections.  Plan: Severe COPD with recurrent exacerbations:, Now with acute exacerbation Prednisone 40 mg daily x 5 days Stop amoxicillin Start Augmentin x 5 days Chest x-ray today Continue Breztri 2 6 puffs twice a  day no matter how you feel Keep using albuterol as needed for chest tightness wheezing or shortness of breath Let us know if no improvement  Seronegative rheumatoid arthritis currently treated with TNF alpha blocker Would have some attention of this on follow-up, recommend alternative therapy for rheumatoid arthritis question sputum culture or further chest imaging?  Follow-up in 1 month, sooner if needed.  Immunizations: Immunization History  Administered Date(s) Administered   Fluad Quad(high Dose 65+) 05/12/2019, 05/12/2020, 05/22/2022   Influenza Split  06/25/2011, 04/15/2012   Influenza Whole 05/04/2009, 06/06/2010   Influenza, High Dose Seasonal PF 05/03/2013   Influenza, Quadrivalent, Recombinant, Inj, Pf 03/28/2021   Influenza,inj,Quad PF,6+ Mos 04/07/2014, 04/03/2015, 04/19/2016, 05/02/2017   Influenza,inj,Quad PF,6-35 Mos 05/13/2012, 05/31/2013, 04/07/2014   Influenza-Unspecified 06/25/2011, 04/15/2012, 05/13/2012, 04/07/2014, 04/21/2018, 04/11/2021   PFIZER Comirnaty(Gray Top)Covid-19 Tri-Sucrose Vaccine 07/30/2019, 08/20/2019   PFIZER(Purple Top)SARS-COV-2 Vaccination 07/30/2019, 08/20/2019   Pneumococcal Conjugate-13 04/07/2014   Pneumococcal Polysaccharide-23 05/04/2009, 04/07/2014, 05/02/2017   Td 08/08/2010   Td (Adult),5 Lf Tetanus Toxid, Preservative Free 08/08/2010   Unspecified SARS-COV-2 Vaccination 07/30/2019, 08/20/2019   Zoster, Live 08/10/2012     Current Outpatient Medications:    albuterol (VENTOLIN HFA) 108 (90 Base) MCG/ACT inhaler, Inhale 2 puffs into the lungs every 6 (six) hours as needed for wheezing or shortness of breath., Disp: 3 each, Rfl: 4   alfuzosin (UROXATRAL) 10 MG 24 hr tablet, TAKE 1 TABLET(10 MG) BY MOUTH DAILY, Disp: 90 tablet, Rfl: 3   amoxicillin-clavulanate (AUGMENTIN) 875-125 MG tablet, Take 1 tablet by mouth 2 (two) times daily., Disp: 10 tablet, Rfl: 0   aspirin-acetaminophen-caffeine (EXCEDRIN MIGRAINE) 250-250-65 MG tablet, Take 2 tablets by mouth 3 (three) times daily as needed for headache., Disp: , Rfl:    azelastine (ASTELIN) 0.1 % nasal spray, Place 1 spray into both nostrils 2 (two) times daily. Use in each nostril as directed, Disp: 90 mL, Rfl: 3   betamethasone, augmented, (DIPROLENE) 0.05 % lotion, Apply 1 application. topically daily., Disp: , Rfl:    BREZTRI AEROSPHERE 160-9-4.8 MCG/ACT AERO, Inhale 2 puffs into the lungs 2 (two) times daily., Disp: 19.6 g, Rfl: 3   Certolizumab Pegol (CIMZIA) 2 X 200 MG KIT, Inject 2 each into the skin every 30 (thirty) days., Disp: , Rfl:     cetirizine (ZYRTEC) 10 MG tablet, Take 1 tablet (10 mg total) by mouth at bedtime., Disp: 90 tablet, Rfl: 3   chlorthalidone (HYGROTON) 25 MG tablet, Take 25 mg by mouth daily., Disp: , Rfl:    doxycycline (MONODOX) 50 MG capsule, Take 50 mg by mouth daily., Disp: , Rfl:    erythromycin ophthalmic ointment, Place 1 application into both eyes daily as needed (dry eyes)., Disp: , Rfl:    ferrous sulfate 325 (65 FE) MG tablet, Take 1 tablet (325 mg total) by mouth 3 (three) times daily with meals., Disp: 270 tablet, Rfl: 3   finasteride (PROSCAR) 5 MG tablet, Take 1 tablet (5 mg total) by mouth daily., Disp: 90 tablet, Rfl: 3   fluticasone (FLONASE) 50 MCG/ACT nasal spray, Place 2 sprays into both nostrils daily as needed for allergies., Disp: 48 g, Rfl: 3   folic acid (FOLVITE) 1 MG tablet, Take 1 tablet (1 mg total) by mouth daily., Disp: 90 tablet, Rfl: 3   gabapentin (NEURONTIN) 300 MG capsule, Take 300-600 mg by mouth See admin instructions. Take 1 capsule (300 mg) by mouth in the morning & take 2 capsules (600 mg) by mouth at night., Disp: , Rfl:  losartan (COZAAR) 100 MG tablet, Take 1 tablet (100 mg total) by mouth daily., Disp: 90 tablet, Rfl: 3   montelukast (SINGULAIR) 10 MG tablet, Take 1 tablet (10 mg total) by mouth at bedtime., Disp: 90 tablet, Rfl: 3   Multiple Vitamin (MULTIVITAMIN WITH MINERALS) TABS tablet, Take 1 tablet by mouth daily., Disp: , Rfl:    omeprazole (PRILOSEC) 20 MG capsule, Take 1 capsule (20 mg total) by mouth daily., Disp: 90 capsule, Rfl: 3   potassium chloride SA (KLOR-CON M) 20 MEQ tablet, Take 1 tablet (20 mEq total) by mouth 2 (two) times daily., Disp: 180 tablet, Rfl: 3   predniSONE (DELTASONE) 20 MG tablet, Take 2 tablets (40 mg total) by mouth daily with breakfast., Disp: 10 tablet, Rfl: 0   predniSONE (DELTASONE) 5 MG tablet, Take 1 tablet (5 mg total) by mouth See admin instructions. Take 1 tablet daily, Disp: 90 tablet, Rfl: 3   roflumilast (DALIRESP)  500 MCG TABS tablet, Take 1 tablet (500 mcg total) by mouth daily., Disp: 90 tablet, Rfl: 3   rosuvastatin (CRESTOR) 10 MG tablet, Take 10 mg by mouth daily., Disp: , Rfl:    albuterol (PROVENTIL) (2.5 MG/3ML) 0.083% nebulizer solution, Take 3 mLs (2.5 mg total) by nebulization every 6 (six) hours as needed for wheezing or shortness of breath., Disp: 360 mL, Rfl: 3

## 2022-08-13 NOTE — Telephone Encounter (Signed)
Pt scheduled for appt 08/13/22  Closing encounter

## 2022-08-13 NOTE — Patient Instructions (Signed)
Severe COPD with recurrent exacerbations:, Now with acute exacerbation Prednisone 40 mg daily x 5 days Stop amoxicillin Start Augmentin x 5 days Chest x-ray today Continue Breztri 2 6 puffs twice a day no matter how you feel Keep using albuterol as needed for chest tightness wheezing or shortness of breath Let us know if no improvement  Seronegative rheumatoid arthritis currently treated with TNF alpha blocker Would have some attention of this on follow-up, recommend alternative therapy for rheumatoid arthritis question sputum culture or further chest imaging?  Follow-up in 1 month, sooner if needed.

## 2022-08-14 DIAGNOSIS — M160 Bilateral primary osteoarthritis of hip: Secondary | ICD-10-CM | POA: Diagnosis not present

## 2022-08-14 DIAGNOSIS — E538 Deficiency of other specified B group vitamins: Secondary | ICD-10-CM | POA: Diagnosis not present

## 2022-08-14 DIAGNOSIS — M5440 Lumbago with sciatica, unspecified side: Secondary | ICD-10-CM | POA: Diagnosis not present

## 2022-08-14 DIAGNOSIS — M0600 Rheumatoid arthritis without rheumatoid factor, unspecified site: Secondary | ICD-10-CM | POA: Diagnosis not present

## 2022-08-14 DIAGNOSIS — I872 Venous insufficiency (chronic) (peripheral): Secondary | ICD-10-CM | POA: Diagnosis not present

## 2022-08-14 DIAGNOSIS — J441 Chronic obstructive pulmonary disease with (acute) exacerbation: Secondary | ICD-10-CM | POA: Diagnosis not present

## 2022-08-14 DIAGNOSIS — G8929 Other chronic pain: Secondary | ICD-10-CM | POA: Diagnosis not present

## 2022-08-14 DIAGNOSIS — I10 Essential (primary) hypertension: Secondary | ICD-10-CM | POA: Diagnosis not present

## 2022-08-14 DIAGNOSIS — R1310 Dysphagia, unspecified: Secondary | ICD-10-CM | POA: Diagnosis not present

## 2022-08-14 DIAGNOSIS — M159 Polyosteoarthritis, unspecified: Secondary | ICD-10-CM | POA: Diagnosis not present

## 2022-08-20 DIAGNOSIS — M0579 Rheumatoid arthritis with rheumatoid factor of multiple sites without organ or systems involvement: Secondary | ICD-10-CM | POA: Diagnosis not present

## 2022-08-22 ENCOUNTER — Encounter: Payer: Self-pay | Admitting: Internal Medicine

## 2022-09-09 ENCOUNTER — Ambulatory Visit: Payer: TRICARE For Life (TFL) | Admitting: Internal Medicine

## 2022-09-10 ENCOUNTER — Ambulatory Visit (INDEPENDENT_AMBULATORY_CARE_PROVIDER_SITE_OTHER): Payer: Medicare PPO | Admitting: Internal Medicine

## 2022-09-10 ENCOUNTER — Encounter: Payer: Self-pay | Admitting: Internal Medicine

## 2022-09-10 VITALS — BP 128/74 | HR 95 | Temp 98.0°F | Ht 67.0 in | Wt 203.0 lb

## 2022-09-10 DIAGNOSIS — M069 Rheumatoid arthritis, unspecified: Secondary | ICD-10-CM | POA: Diagnosis not present

## 2022-09-10 DIAGNOSIS — J410 Simple chronic bronchitis: Secondary | ICD-10-CM | POA: Diagnosis not present

## 2022-09-10 NOTE — Patient Instructions (Addendum)
Please schedule follow up scheduled with myself in 6 months.  If my schedule is not open yet, we will contact you with a reminder closer to that time. Please call [phone] if you haven't heard from [place] a month before.   Before your next visit I would like you to have: Full set of PFTs - come early before next appointment for this.   Continue Breztri and albuterol. Continue sinus medications.   I will reach out to Dr. Dossie Der about your RA to see if we can change your injection medication cimzia.

## 2022-09-10 NOTE — Addendum Note (Signed)
Addended by: Gavin Potters R on: 09/10/2022 10:04 AM   Modules accepted: Orders

## 2022-09-10 NOTE — Progress Notes (Signed)
Thomas Hudson    [id number]    [birth year]  Primary Care Physician:Letvak, Theophilus Kinds, MD Date of Appointment: 09/10/2022 Established Patient Visit  Chief complaint:   Chief Complaint  Patient presents with   Follow-up    Cough improved      HPI: Thomas Hudson is a 82 y.o. gentleman with history of allergic rhinitis who presented Nov 2021 with shortness of breath and was started on albuterol prn. Diagnosed with COPD. Symptom onset was around 2009, no childhood respiratory disease or asthma.   Interval Updates: Here for follow up.  On Breztri for maintenance with prn albuteorl nebulizers. Also on daliresp.   Multiple flares of COPD requiring prednisone in the last year, most recently in Feb 2024 seen by Dr. [place] Bells  Feels sinus issues are starting to worsen now that spring is coming. Was continued on flonase, astelin. Continued on singulair, cetirizine as well. These do help his symptoms and feels well controlled. Has seen ENT in the past,   At baseline on 5 mg prednisone for his RA. Taking infusion therapy for his RA - cimzia TNF-alpha inhibitor - at [facility] - sees Dr. Dossie Der.    Social history: 100 pack year history  I have reviewed the patient's family social and past medical history and updated as appropriate.   Past Medical History:  Diagnosis Date   BPH (benign prostatic hyperplasia)    Carpal tunnel syndrome on both sides    Complication of anesthesia    Low BP and O2 sats after EGD   COPD (chronic obstructive pulmonary disease) (HCC)    GERD (gastroesophageal reflux disease)    Hyperlipidemia    Hypertension    RAD (reactive airway disease)    no PMH of asthma; RAD post infection   Rheumatoid arthritis ([place])         Past Surgical History:  Procedure Laterality Date   BIOPSY  01/06/2018   Procedure: BIOPSY;  Surgeon: Irene Shipper, MD;  Location: [facility];  Service: Endoscopy;;   BIOPSY  01/03/2020   Procedure: BIOPSY;  Surgeon: Irene Shipper, MD;  Location: [facility];  Service: Endoscopy;;   CATARACT EXTRACTION W/PHACO Left 09/11/2021   Procedure: CATARACT EXTRACTION PHACO AND INTRAOCULAR LENS PLACEMENT ([place]) LEFT 6.38 00:41.6;  Surgeon: Birder Robson, MD;  Location: [place];  Service: Ophthalmology;  Laterality: Left;   CATARACT EXTRACTION W/PHACO Right 09/25/2021   Procedure: CATARACT EXTRACTION PHACO AND INTRAOCULAR LENS PLACEMENT ([facility]) RIGHT 8.01 00:49.9;  Surgeon: Birder Robson, MD;  Location: [place];  Service: Ophthalmology;  Laterality: Right;   COLONOSCOPY W/ POLYPECTOMY  2009   X 2; Dr.Perry    ERCP N/A 11/05/2016   Procedure: ENDOSCOPIC RETROGRADE CHOLANGIOPANCREATOGRAPHY (ERCP);  Surgeon: Irene Shipper, MD;  [facility] END[facility];  Service: Endoscopy;  Laterality: N/A;   ESOPHAGOGASTRODUODENOSCOPY (EGD) WITH PROPOFOL N/A 07/29/2016   Procedure: ESOPHAGOGASTRODUODENOSCOPY (EGD) WITH PROPOFOL;  Surgeon: Irene Shipper, MD;  Location: [facility];  Service: Endoscopy;  Laterality: N/A;  will need ERCP scope   ESOPHAGOGASTRODUODENOSCOPY (EGD) WITH PROPOFOL N/A 01/06/2018   Procedure: ESOPHAGOGASTRODUODENOSCOPY (EGD) WITH PROPOFOL;  Surgeon: Irene Shipper, MD;  Location: [facility];  Service: Endoscopy;  Laterality: N/A;  NEED ERCP SCOPE   ESOPHAGOGASTRODUODENOSCOPY (EGD) WITH PROPOFOL N/A 01/03/2020   Procedure: ESOPHAGOGASTRODUODENOSCOPY (EGD) WITH PROPOFOL;  Surgeon: Irene Shipper, MD;  Location: [facility];  Service: Endoscopy;  Laterality: N/A;  Needs ERCP scope   HERNIA  REPAIR     LUMBAR LAMINECTOMY  07/2008   UPPER GASTROINTESTINAL ENDOSCOPY      gastric polyp;Dr.Perry    VASECTOMY     WHIPPLE PROCEDURE  2012   partial ; [facility]    Family History  Problem Relation Age of Onset   Skin cancer Mother    Throat cancer Father        smoker   Esophageal cancer Father    Uterine cancer Sister    COPD Sister        X2   Ovarian cancer Sister    Throat cancer Sister        smoker    Breast cancer Sister    Lung cancer Sister        smoker   Leukemia Sister    Coronary artery disease Brother        in 8s   Lung cancer Brother        kidney cancer   Throat cancer Brother    Heart disease Brother    Heart disease Brother    Heart disease Maternal Grandfather    Stroke Paternal Grandmother 14   Colon cancer Paternal Grandfather    Heart disease Paternal Grandfather    Prostate cancer Paternal Uncle    Heart attack Paternal Uncle        3 uncles, >55   Asthma Neg Hx     Social History   Occupational History   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  Gen:     NAD Lungs:   no wheezes or crackles, no increased wob CV:        RRR no mrg   Data Reviewed: Imaging: I have personally reviewed the CT Chest Cardiac with limited lung windows in Feb 2021 - lung windows available are clear without nodules of effusion.   Chest xray obtained feb 2024 - no acute process  Reviewed his CT maxillofacial from 2013 and 2017 with chronic sinusitis.   PFTs: Mild restriction to ventilation Jan 2022  Labs: Lab Results  Component Value Date   WBC 6.5 05/09/2022   HGB 12.7 (L) 05/09/2022   HCT 37.9 (L) 05/09/2022   MCV 92.0 05/09/2022   PLT 328 05/09/2022   Lab Results  Component Value Date   NA 139 07/18/2022   K 3.0 (L) 07/18/2022   CL 102 07/18/2022   CO2 28  07/18/2022     Immunization status: Immunization History  Administered Date(s) Administered   Fluad Quad(high Dose 65+) 05/12/2019, 05/12/2020, 05/22/2022   Influenza Split 06/25/2011, 04/15/2012   Influenza Whole 05/04/2009, 06/06/2010   Influenza, High Dose Seasonal PF 05/03/2013   Influenza, Quadrivalent, Recombinant, Inj, Pf 03/28/2021   Influenza,inj,Quad PF,6+ Mos 04/07/2014, 04/03/2015, 04/19/2016, 05/02/2017  Influenza,inj,Quad PF,6-35 Mos 05/13/2012, 05/31/2013, 04/07/2014   Influenza-Unspecified 06/25/2011, 04/15/2012, 05/13/2012, 04/07/2014, 04/21/2018, 04/11/2021   PFIZER Comirnaty(Gray Top)Covid-19 Tri-Sucrose Vaccine 07/30/2019, 08/20/2019   PFIZER(Purple Top)SARS-COV-2 Vaccination 07/30/2019, 08/20/2019   Pneumococcal Conjugate-13 04/07/2014   Pneumococcal Polysaccharide-23 05/04/2009, 04/07/2014, 05/02/2017   Td 08/08/2010   Td (Adult),5 Lf Tetanus Toxid, Preservative Free 08/08/2010   Unspecified SARS-COV-2 Vaccination 07/30/2019, 08/20/2019   Zoster, Live 08/10/2012    Assessment:  COPD FEV1 85% of predicted with high symptom burden and recurrent exacerbations, not well controlled.  Chronic allergic and non allergic sinusitis - controlled History of 100 pack year smoking, quit 1999 Rheumatoid Arthritis on TNF-alpha inhibitor  Plan/Recommendations: continue albuterol continue breztri  continue singulair, flonase, zyrtec for rhinitis, with nasal saline. continue astelin nasal spray for ongoing rhinitis continue daliresp for now.  IgE and eos have previously not been singificantly elevated.   I am concerned TNF-alpha inhibitor with increased risk of respiratory infections up to 22% could be contributing to his symptoms. Will reach out to Dr. Dossie Der to see if an alternative agent could be used. His next injection is next week.   Return to Care: Return in about 6 months (around 03/13/2023).   Lenice Llamas, MD Pulmonary and [place]

## 2022-09-11 DIAGNOSIS — Z961 Presence of intraocular lens: Secondary | ICD-10-CM | POA: Diagnosis not present

## 2022-09-11 DIAGNOSIS — H26492 Other secondary cataract, left eye: Secondary | ICD-10-CM | POA: Diagnosis not present

## 2022-09-17 DIAGNOSIS — M0579 Rheumatoid arthritis with rheumatoid factor of multiple sites without organ or systems involvement: Secondary | ICD-10-CM | POA: Diagnosis not present

## 2022-09-18 ENCOUNTER — Encounter: Payer: Self-pay | Admitting: Internal Medicine

## 2022-09-18 DIAGNOSIS — R0609 Other forms of dyspnea: Secondary | ICD-10-CM

## 2022-09-18 DIAGNOSIS — Z87891 Personal history of nicotine dependence: Secondary | ICD-10-CM

## 2022-09-18 MED ORDER — ALBUTEROL SULFATE HFA 108 (90 BASE) MCG/ACT IN AERS: 2.0000 | INHALATION_SPRAY | Freq: Four times a day (QID) | RESPIRATORY_TRACT | 3 refills | Status: DC | PRN

## 2022-09-23 ENCOUNTER — Other Ambulatory Visit: Payer: Self-pay | Admitting: Internal Medicine

## 2022-09-23 ENCOUNTER — Encounter: Payer: Self-pay | Admitting: Internal Medicine

## 2022-09-25 MED ORDER — ALBUTEROL SULFATE (2.5 MG/3ML) 0.083% IN NEBU: 2.5000 mg | INHALATION_SOLUTION | Freq: Four times a day (QID) | RESPIRATORY_TRACT | 3 refills | Status: DC | PRN

## 2022-09-25 NOTE — Telephone Encounter (Signed)
Spoke to daughter and informed her of medication refill sent to pharmacy for albuterol for the neb. Nothing further needed.

## 2022-09-30 DIAGNOSIS — R200 Anesthesia of skin: Secondary | ICD-10-CM | POA: Diagnosis not present

## 2022-09-30 DIAGNOSIS — R202 Paresthesia of skin: Secondary | ICD-10-CM | POA: Diagnosis not present

## 2022-09-30 DIAGNOSIS — R209 Unspecified disturbances of skin sensation: Secondary | ICD-10-CM | POA: Diagnosis not present

## 2022-10-17 DIAGNOSIS — I10 Essential (primary) hypertension: Secondary | ICD-10-CM | POA: Diagnosis not present

## 2022-10-17 DIAGNOSIS — N1831 Chronic kidney disease, stage 3a: Secondary | ICD-10-CM | POA: Diagnosis not present

## 2022-10-17 DIAGNOSIS — N400 Enlarged prostate without lower urinary tract symptoms: Secondary | ICD-10-CM | POA: Diagnosis not present

## 2022-10-17 DIAGNOSIS — J449 Chronic obstructive pulmonary disease, unspecified: Secondary | ICD-10-CM | POA: Diagnosis not present

## 2022-10-18 DIAGNOSIS — M0579 Rheumatoid arthritis with rheumatoid factor of multiple sites without organ or systems involvement: Secondary | ICD-10-CM | POA: Diagnosis not present

## 2022-10-29 DIAGNOSIS — M5136 Other intervertebral disc degeneration, lumbar region: Secondary | ICD-10-CM | POA: Diagnosis not present

## 2022-10-29 DIAGNOSIS — M0600 Rheumatoid arthritis without rheumatoid factor, unspecified site: Secondary | ICD-10-CM | POA: Diagnosis not present

## 2022-10-29 DIAGNOSIS — M1990 Unspecified osteoarthritis, unspecified site: Secondary | ICD-10-CM | POA: Diagnosis not present

## 2022-10-29 DIAGNOSIS — M5030 Other cervical disc degeneration, unspecified cervical region: Secondary | ICD-10-CM | POA: Diagnosis not present

## 2022-10-29 DIAGNOSIS — Z79899 Other long term (current) drug therapy: Secondary | ICD-10-CM | POA: Diagnosis not present

## 2022-10-29 DIAGNOSIS — M150 Primary generalized (osteo)arthritis: Secondary | ICD-10-CM | POA: Diagnosis not present

## 2022-11-06 DIAGNOSIS — N183 Chronic kidney disease, stage 3 unspecified: Secondary | ICD-10-CM | POA: Diagnosis not present

## 2022-11-06 DIAGNOSIS — N1831 Chronic kidney disease, stage 3a: Secondary | ICD-10-CM | POA: Diagnosis not present

## 2022-11-11 ENCOUNTER — Ambulatory Visit (INDEPENDENT_AMBULATORY_CARE_PROVIDER_SITE_OTHER): Payer: Medicare PPO | Admitting: Internal Medicine

## 2022-11-11 ENCOUNTER — Encounter: Payer: Self-pay | Admitting: Internal Medicine

## 2022-11-11 VITALS — BP 126/60 | HR 104 | Ht 67.0 in | Wt 192.8 lb

## 2022-11-11 DIAGNOSIS — R0609 Other forms of dyspnea: Secondary | ICD-10-CM | POA: Diagnosis not present

## 2022-11-11 DIAGNOSIS — J0191 Acute recurrent sinusitis, unspecified: Secondary | ICD-10-CM

## 2022-11-11 DIAGNOSIS — D849 Immunodeficiency, unspecified: Secondary | ICD-10-CM | POA: Diagnosis not present

## 2022-11-11 DIAGNOSIS — M069 Rheumatoid arthritis, unspecified: Secondary | ICD-10-CM

## 2022-11-11 DIAGNOSIS — Z87891 Personal history of nicotine dependence: Secondary | ICD-10-CM

## 2022-11-11 DIAGNOSIS — J441 Chronic obstructive pulmonary disease with (acute) exacerbation: Secondary | ICD-10-CM

## 2022-11-11 MED ORDER — DOXYCYCLINE HYCLATE 100 MG PO TABS
100.0000 mg | ORAL_TABLET | Freq: Two times a day (BID) | ORAL | 0 refills | Status: DC
Start: 2022-11-11 — End: 2022-11-27

## 2022-11-11 MED ORDER — METHYLPREDNISOLONE ACETATE 80 MG/ML IJ SUSP
80.0000 mg | Freq: Once | INTRAMUSCULAR | Status: DC
Start: 2022-11-11 — End: 2023-05-27

## 2022-11-11 MED ORDER — PREDNISONE 10 MG PO TABS
ORAL_TABLET | ORAL | 0 refills | Status: AC
Start: 2022-11-11 — End: 2022-11-23

## 2022-11-11 NOTE — Progress Notes (Signed)
OV 11/11/2022  Subjective:  Patient ID: Thomas Hudson, male , DOB: 1941-04-27 , age 82 y.o. , MRN: [id number] , ADDRESS: [address] [place] [id number] PCP Karie Schwalbe, MD Patient Care Team: Karie Schwalbe, MD as PCP - General (Internal Medicine)  This Provider for this visit: Treatment Team:  Attending Provider: Kalman Shan, MD    11/11/2022 -   Chief Complaint  Patient presents with   Acute Visit    Cough, congestion, white phlegm, wheezing.      HPI Thomas Hudson 82 y.o. -acute visit for Dr. Durel Salts patient.  Patient has 100 pack smoking history having quit 25 years ago.  He is here with his daughter and his wife.  He is an independent historian and the daughter is the independent historian today.  He is on chronic daily prednisone because of his rheumatoid arthritis along with immunomodulator.  He has a history of COPD based on his smoking history and recurrent wheezing episodes.  Although his FEV1 and DLCO are fairly preserved as of January 2022.  It appears in the last 1 year has been on prednisone at least 3-4 times as burst this because of COPD symptoms.  He was also noted to have chronic sinus drainage.  According to the daughter they did have a sinus CT in the last 1 year and is seeing ENT but review of the records indicate the last CT scan of the sinus was in 2013 and 2017.  Last CT chest was in 2021.  Last echocardiogram was in 2021.  In 2021 his RAST allergy panel was normal.  He says he was in his usual state of health.  On Nov 06, 2022 Wednesday started mowing the lawn this continued over the next 2 days.  On Thursday, Nov 07, 2022 started feeling a little tired and by Friday, Nov 08, 2022 he started having sinus drainage increased cough congestion wheezing.  He has baseline pedal edema this is unchanged.  No fever.  His sputum has changed from white to yellow or green.  He is definitely feeling more fatigued.  He has nebulizer at home.  He  continues his BREZTRI he is not on oxygen at home.   He is on sulgiarThey are frustrated by the repeated exacerbations.  They are wondering about immunocompromise status because of rheumatoid arthritis drugs.  Review of his labs indicate his blood eosinophils have been less than 300 cells per cubic millimeter but then he is also on daily prednisone.  His RAST allergy panel was normal 2021.   Allergies  Allergen Reactions   Morphine And Related Other (See Comments)    Itching & rash Because of a history of documented adverse serious drug reaction;Medi Alert bracelet  is recommended     PFT     Latest Ref Rng & Units 07/31/2020   12:51 PM  PFT Results  FVC-Pre L 2.70   FVC-Predicted Pre % 73   FVC-Post L 2.56   FVC-Predicted Post % 69   Pre FEV1/FVC % % 83   Post FEV1/FCV % % 87   FEV1-Pre L 2.24   FEV1-Predicted Pre % 85   FEV1-Post L 2.24   DLCO uncorrected ml/min/mmHg 16.91   DLCO UNC% % 74   DLCO corrected ml/min/mmHg 16.91   DLCO COR %Predicted % 74   DLVA Predicted % 123   TLC L 4.78   TLC % Predicted % 74   RV % Predicted %  83     CT ABD 07/04/21  Narrative & Impression  CLINICAL DATA:  Intermittent left lower quadrant abdominal pain.   EXAM: CT ABDOMEN AND PELVIS WITH CONTRAST   TECHNIQUE: Multidetector CT imaging of the abdomen and pelvis was performed using the standard protocol following bolus administration of intravenous contrast.   CONTRAST:  80mL OMNIPAQUE IOHEXOL 300 MG/ML  SOLN   COMPARISON:  None.   FINDINGS: Lower chest: Minimal dependent atelectasis is present. Heart size is normal. No significant pleural or pericardial effusion is present.   Hepatobiliary: No focal liver abnormality is seen. Status post cholecystectomy. No biliary dilatation.   Pancreas: Unremarkable. No pancreatic ductal dilatation or surrounding inflammatory changes.   Spleen: Normal in size without focal abnormality.   Adrenals/Urinary Tract: Adrenal glands are  normal bilaterally. Asymmetric cortical atrophy noted in the left kidney. No discrete lesion is present. No stone or obstruction is present. The ureters are within normal limits bilaterally. The urinary bladder is within normal limits.   Stomach/Bowel: The stomach and duodenum are within normal limits. Small bowel is unremarkable. Terminal ileum is within normal limits. The appendix is not discretely visualized and may be surgically absent. The ascending and transverse colon are within normal limits. Diverticular changes are present the distal descending and proximal sigmoid colon. Fluid is present in the rectum.   Vascular/Lymphatic: Atherosclerotic changes are present in the aorta and branch vessels. No significant adenopathy is present.   Reproductive: The prostate gland measures 5.4 cm in transverse diameter.   Other: 2 ventral hernias are present about the umbilicus. There is some stranding of fat within both hernias. No associated bowel is present. The hernias both measure less than 10 mm at their opening.   Musculoskeletal: Degenerative facet changes are present in the lumbar spine right greater than left. No focal osseous lesions are present. Bony pelvis is within normal limits. The hips are located and within normal limits.   IMPRESSION: 1. 2 ventral hernias about the umbilicus with some stranding of fat within both hernias. No associated bowel is present. 2. Sigmoid diverticulosis without diverticulitis. 3. Fluid in the rectum may represent diarrhea. 4. Cholecystectomy. 5. Aortic Atherosclerosis (ICD10-I70.0).     Electronically Signed   By: Marin Roberts M.D.   On: 07/04/2021 13:18     CT CORONRIARS 2021   FINDINGS: Cardiovascular: No incidental findings.   Mediastinum/Nodes: Visualized mediastinum and hilar regions demonstrate no lymphadenopathy or masses.   Lungs/Pleura: Visualized lungs show no evidence of pulmonary edema, consolidation,  pneumothorax, nodule or pleural fluid.   Upper Abdomen: Visualized upper abdominal structures are unremarkable.   Musculoskeletal: Visualized bony structures are unremarkable.   IMPRESSION: No significant incidental findings.     Electronically Signed   By: Irish Lack M.D.   On: 09/03/2019 14:00    Latest Reference Range & Units 05/31/20 10:07  Interpretation  Pend  Sheep Sorrel IgE kU/L <0.10  Pecan/Hickory Tree IgE kU/L <0.10  IgE (Immunoglobulin E), Serum <OR=114 kU/L 102  Allergen, D pternoyssinus,d7 kU/L <0.10  Cat Dander kU/L <0.10  Dog Dander kU/L <0.10  [place] Grass kU/L <0.10  [place] Grass kU/L <0.10  Timothy Grass kU/L <0.10  Cockroach kU/L <0.10  Aspergillus fumigatus, m3 kU/L <0.10  Allergen, Comm Silver Charletta Cousin, t9 kU/L <0.10  Allergen, Cottonwood, t14 kU/L <0.10  Elm IgE kU/L <0.10  Allergen, Mulberry, t76 kU/L <0.10  Allergen, Oak,t7 kU/L <0.10  COMMON RAGWEED (SHORT) (W1) IGE kU/L <0.10  Allergen, Mouse Urine Protein, e78 kU/L <0.10  D. farinae kU/L <0.10  Allergen, Cedar tree, t12 kU/L <0.10  Box Elder IgE kU/L <0.10  Rough Pigweed  IgE kU/L <0.10  Allergen, A. alternata, m6 kU/L <0.10  Allergen, P. notatum, m1 kU/L <0.10  CLADOSPORIUM HERBARUM (M2) IGE kU/L <0.10  Class  0 0 0 0 0 0 0 0 0 0  0 0 0 0 0 0 0 0 0 0  0 0 0 0     Latest Reference Range & Units 03/20/07 20:46 03/21/08 08:12 07/11/08 09:05 05/08/09 08:49 05/12/09 20:14 08/02/09 09:24 08/08/10 11:43 02/27/12 11:36 08/26/12 11:48 04/02/13 11:39 04/04/14 10:47 04/18/15 09:43 04/19/16 10:55 05/14/16 16:13 11/19/16 11:31 02/20/17 09:51 11/21/20 15:57  Eosinophils Absolute 0.0 - 0.7 K/uL 0.2 0.2 0.2 0.2 0.2 0.1 0.1 0.1 0.2 0.1 0.1 0.1 0.2 0.3 0.2 0.2 0.1    has a past medical history of BPH (benign prostatic hyperplasia), Carpal tunnel syndrome on both sides, Complication of anesthesia, COPD (chronic obstructive pulmonary disease) (HCC), GERD (gastroesophageal reflux  disease), Hyperlipidemia, Hypertension, RAD (reactive airway disease), and Rheumatoid arthritis (HCC).   reports that he quit smoking about 25 years ago. His smoking use included cigarettes. He has a 100.00 pack-year smoking history. He has been exposed to tobacco smoke. He has never used smokeless tobacco.  Past Surgical History:  Procedure Laterality Date   BIOPSY  01/06/2018   Procedure: BIOPSY;  Surgeon: Hilarie Fredrickson, MD;  Location: [facility];  Service: Endoscopy;;   BIOPSY  01/03/2020   Procedure: BIOPSY;  Surgeon: Hilarie Fredrickson, MD;  Location: [facility];  Service: Endoscopy;;   CATARACT EXTRACTION W/PHACO Left 09/11/2021   Procedure: CATARACT EXTRACTION PHACO AND INTRAOCULAR LENS PLACEMENT (IOC) LEFT 6.38 00:41.6;  Surgeon: Galen Manila, MD;  Location[facility];  Service: Ophthalmology;  Laterality: Left;   CATARACT EXTRACTION W/PHACO Right 09/25/2021   Procedure: CATARACT EXTRACTION PHACO AND INTRAOCULAR LENS PLACEMENT (IOC) RIGHT 8.01 00:49.9;  Surgeon: Galen Manila, MD;  Location: [facility];  Service: Ophthalmology;  Laterality: Right;   COLONOSCOPY W/ POLYPECTOMY  2009   X 2; Dr.Perry    ERCP N/A 11/05/2016   Procedure: ENDOSCOPIC RETROGRADE CHOLANGIOPANCREATOGRAPHY (ERCP);  Surgeon: Hilarie Fredrickson, MD;  Location: [facility];  Service: Endoscopy;  Laterality: N/A;   ESOPHAGOGASTRODUODENOSCOPY (EGD) WITH PROPOFOL N/A 07/29/2016   Procedure: ESOPHAGOGASTRODUODENOSCOPY (EGD) WITH PROPOFOL;  Surgeon: Hilarie Fredrickson, MD;  Location: [facility];  Service: Endoscopy;  Laterality: N/A;  will need ERCP scope   ESOPHAGOGASTRODUODENOSCOPY (EGD) WITH PROPOFOL N/A 01/06/2018   Procedure: ESOPHAGOGASTRODUODENOSCOPY (EGD) WITH PROPOFOL;  Surgeon: Hilarie Fredrickson, MD;  Location: [facility];  Service: Endoscopy;  Laterality: N/A;  NEED ERCP SCOPE   ESOPHAGOGASTRODUODENOSCOPY (EGD) WITH PROPOFOL N/A 01/03/2020   Procedure: ESOPHAGOGASTRODUODENOSCOPY (EGD) WITH PROPOFOL;   Surgeon: Hilarie Fredrickson, MD;  Location: [facility];  Service: Endoscopy;  Laterality: N/A;  Needs ERCP scope   HERNIA REPAIR     LUMBAR LAMINECTOMY  07/2008   UPPER GASTROINTESTINAL ENDOSCOPY      gastric polyp;Dr.Perry    VASECTOMY     WHIPPLE PROCEDURE  2012   partial ; [facility]    Allergies  Allergen Reactions   Morphine And Related Other (See Comments)    Itching & rash Because of a history of documented adverse serious drug reaction;Medi Alert bracelet  is recommended    Immunization History  Administered Date(s) Administered   Fluad Quad(high Dose 65+) 05/12/2019, 05/12/2020, 05/22/2022   Influenza Split 06/25/2011, 04/15/2012   Influenza Whole 05/04/2009, 06/06/2010  Influenza, High Dose Seasonal PF 05/03/2013   Influenza, Quadrivalent, Recombinant, Inj, Pf 03/28/2021   Influenza,inj,Quad PF,6+ Mos 04/07/2014, 04/03/2015, 04/19/2016, 05/02/2017   Influenza,inj,Quad PF,6-35 Mos 05/13/2012, 05/31/2013, 04/07/2014   Influenza-Unspecified 06/25/2011, 04/15/2012, 05/13/2012, 04/07/2014, 04/21/2018, 04/11/2021   PFIZER Comirnaty(Gray Top)Covid-19 Tri-Sucrose Vaccine 07/30/2019, 08/20/2019   PFIZER(Purple Top)SARS-COV-2 Vaccination 07/30/2019, 08/20/2019   Pneumococcal Conjugate-13 04/07/2014   Pneumococcal Polysaccharide-23 05/04/2009, 04/07/2014, 05/02/2017   Td 08/08/2010   Td (Adult),5 Lf Tetanus Toxid, Preservative Free 08/08/2010   Unspecified SARS-COV-2 Vaccination 07/30/2019, 08/20/2019   Zoster, Live 08/10/2012    Family History  Problem Relation Age of Onset   Skin cancer Mother    Throat cancer Father        smoker   Esophageal cancer Father    Uterine cancer Sister    COPD Sister        X2   Ovarian cancer Sister    Throat cancer Sister        smoker   Breast cancer Sister    Lung cancer Sister        smoker   Leukemia Sister    Coronary artery disease Brother        in 69s   Lung cancer Brother        kidney cancer   Throat cancer Brother    Heart  disease Brother    Heart disease Brother    Heart disease Maternal Grandfather    Stroke Paternal Grandmother 33   Colon cancer Paternal Grandfather    Heart disease Paternal Grandfather    Prostate cancer Paternal Uncle    Heart attack Paternal Uncle        3 uncles, >55   Asthma Neg Hx      Current Outpatient Medications:    albuterol (PROVENTIL) (2.5 MG/3ML) 0.083% nebulizer solution, Take 3 mLs (2.5 mg total) by nebulization every 6 (six) hours as needed for wheezing or shortness of breath., Disp: 360 mL, Rfl: 3   albuterol (VENTOLIN HFA) 108 (90 Base) MCG/ACT inhaler, Inhale 2 puffs into the lungs every 6 (six) hours as needed for wheezing or shortness of breath., Disp: 3 each, Rfl: 3   alfuzosin (UROXATRAL) 10 MG 24 hr tablet, TAKE 1 TABLET(10 MG) BY MOUTH DAILY, Disp: 90 tablet, Rfl: 3   aspirin-acetaminophen-caffeine (EXCEDRIN MIGRAINE) 250-250-65 MG tablet, Take 2 tablets by mouth 3 (three) times daily as needed for headache., Disp: , Rfl:    azelastine (ASTELIN) 0.1 % nasal spray, Place 1 spray into both nostrils 2 (two) times daily. Use in each nostril as directed, Disp: 90 mL, Rfl: 3   betamethasone, augmented, (DIPROLENE) 0.05 % lotion, Apply 1 application. topically daily., Disp: , Rfl:    BREZTRI AEROSPHERE 160-9-4.8 MCG/ACT AERO, Inhale 2 puffs into the lungs 2 (two) times daily., Disp: 30.6 g, Rfl: 3   Certolizumab Pegol (CIMZIA) 2 X 200 MG KIT, Inject 2 each into the skin every 30 (thirty) days., Disp: , Rfl:    cetirizine (ZYRTEC) 10 MG tablet, Take 1 tablet (10 mg total) by mouth at bedtime., Disp: 90 tablet, Rfl: 3   chlorthalidone (HYGROTON) 25 MG tablet, Take 25 mg by mouth daily., Disp: , Rfl:    doxycycline (MONODOX) 50 MG capsule, Take 50 mg by mouth daily., Disp: , Rfl:    erythromycin ophthalmic ointment, Place 1 application into both eyes daily as needed (dry eyes)., Disp: , Rfl:    ferrous sulfate 325 (65 FE) MG tablet, Take 1 tablet (325 mg total) by  mouth 3  (three) times daily with meals., Disp: 270 tablet, Rfl: 3   finasteride (PROSCAR) 5 MG tablet, Take 1 tablet (5 mg total) by mouth daily., Disp: 90 tablet, Rfl: 3   fluticasone (FLONASE) 50 MCG/ACT nasal spray, Place 2 sprays into both nostrils daily as needed for allergies., Disp: 48 g, Rfl: 3   folic acid (FOLVITE) 1 MG tablet, Take 1 tablet (1 mg total) by mouth daily., Disp: 90 tablet, Rfl: 3   losartan (COZAAR) 100 MG tablet, Take 1 tablet (100 mg total) by mouth daily., Disp: 90 tablet, Rfl: 3   montelukast (SINGULAIR) 10 MG tablet, Take 1 tablet (10 mg total) by mouth at bedtime., Disp: 90 tablet, Rfl: 3   Multiple Vitamin (MULTIVITAMIN WITH MINERALS) TABS tablet, Take 1 tablet by mouth daily., Disp: , Rfl:    omeprazole (PRILOSEC) 20 MG capsule, Take 1 capsule (20 mg total) by mouth daily., Disp: 90 capsule, Rfl: 3   potassium chloride SA (KLOR-CON M) 20 MEQ tablet, Take 1 tablet (20 mEq total) by mouth 2 (two) times daily., Disp: 180 tablet, Rfl: 3   predniSONE (DELTASONE) 20 MG tablet, Take 2 tablets (40 mg total) by mouth daily with breakfast., Disp: 10 tablet, Rfl: 0   predniSONE (DELTASONE) 5 MG tablet, Take 1 tablet (5 mg total) by mouth See admin instructions. Take 1 tablet daily, Disp: 90 tablet, Rfl: 3   roflumilast (DALIRESP) 500 MCG TABS tablet, Take 1 tablet (500 mcg total) by mouth daily., Disp: 90 tablet, Rfl: 3   rosuvastatin (CRESTOR) 10 MG tablet, Take 10 mg by mouth daily., Disp: , Rfl:    amoxicillin-clavulanate (AUGMENTIN) 875-125 MG tablet, Take 1 tablet by mouth 2 (two) times daily. (Patient not taking: Reported on 11/11/2022), Disp: 10 tablet, Rfl: 0   gabapentin (NEURONTIN) 300 MG capsule, Take 300-600 mg by mouth See admin instructions. Take 1 capsule (300 mg) by mouth in the morning & take 2 capsules (600 mg) by mouth at night. (Patient not taking: Reported on 11/11/2022), Disp: , Rfl:       Objective:   Vitals:   11/11/22 1540  BP: 126/60  Pulse: (!) 104  SpO2:  94%  Weight: 192 lb 12.8 oz (87.5 kg)  Height: 5\' 7"  (1.702 m)    Estimated body mass index is 30.2 kg/m as calculated from the following:   Height as of this encounter: 5\' 7"  (1.702 m).   Weight as of this encounter: 192 lb 12.8 oz (87.5 kg).  @WEIGHTCHANGE @  [employer]   11/11/22 1540  Weight: 192 lb 12.8 oz (87.5 kg)     Physical Exam    General: No distress. Congsetd, with wet couhg Neuro: Alert and Oriented x 3. GCS 15. Speech normal Psych: Pleasant Resp:  Barrel Chest - no.  Wheeze - YES, Crackles - no, No overt respiratory distress CVS: Normal heart sounds. Murmurs - n Ext: Stigmata of Connective Tissue Disease - no HEENT: Normal upper airway. PEERL +. No post nasal drip        Assessment:       ICD-10-CM   1. COPD with acute exacerbation (HCC)  J44.1     2. COPD, frequent exacerbations (HCC)  J44.1     3. Acute recurrent sinusitis, unspecified location  J01.91     4. Rheumatoid arthritis, involving unspecified site, unspecified whether rheumatoid factor present (HCC)  M06.9     5. 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 History of tobacco use  Z61.096          Plan:     Patient Instructions     ICD-10-CM   1. COPD with acute exacerbation (HCC)  J44.1     2. COPD, frequent exacerbations (HCC)  J44.1     3. Rheumatoid arthritis, involving unspecified site, unspecified whether rheumatoid factor present (HCC)  M06.9     4. 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 History of tobacco use  Z87.891       I think you are having acute sinusitis and acute COPD exacerbation Probable triggers chronic sinusitis and possibly spring allergy season Unclear to what extent drugs for rheumatoid arthritis compromising immune system  Plan -Check CBC with differential, , BNP 11/11/2022 -Check blood alpha-1 antitrypsin phenotype -Check blood IgA, IgM, IgG and IgE -Check echocardiogram in the next of 4-8 weeks -Check CT chest without contrast in the next 4-8  weeks -Do follow-up with your ENT physician  -Get IM Depo-Medrol 80 mg x 1 today in the office - Take doxycycline 100mg  po twice daily x 7 days; take after meals and avoid sunlight - Take prednisone 40 mg daily x 2 days, then 20mg  daily x 2 days, then 10mg  daily x 2 days, then 5mg  daily baseline dose to continue  Follow-up - 4-8 weeks with Dr. Durel Salts or nurse practitioner to discuss test results and further course  - considr dupixent or daliresp   ( Level 05 visit: Estb 40-54 min  visit type: on-site physical face to visit  in total care time and counseling or/and coordination of care by this undersigned MD - Dr Kalman Shan. This includes one or more of the following on this same day 11/11/2022: pre-charting, chart review, note writing, documentation discussion of test results, diagnostic or treatment recommendations, prognosis, risks and benefits of management options, instructions, education, compliance or risk-factor reduction. It excludes time spent by the CMA or office staff in the care of the patient. Actual time 45 min)   SIGNATURE    Dr. Kalman Shan, M.D., F.C.C.P,  Pulmonary and Critical Care Medicine Staff Physician, [facility] Director - Interstitial Lung Disease  Program  Pulmonary Fibrosis [facility] at [facility] [place], [place], [address]  Pager: [phone], If no answer or between  15:00h - 7:00h: call [phone] Telephone: [phone]  4:17 PM 11/11/2022

## 2022-11-11 NOTE — Patient Instructions (Addendum)
ICD-10-CM   1. COPD with acute exacerbation (HCC)  J44.1     2. COPD, frequent exacerbations (HCC)  J44.1     3. Rheumatoid arthritis, involving unspecified site, unspecified whether rheumatoid factor present (HCC)  M06.9     4. 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 History of tobacco use  Z87.891       I think you are having acute sinusitis and acute COPD exacerbation Probable triggers chronic sinusitis and possibly spring allergy season Unclear to what extent drugs for rheumatoid arthritis compromising immune system  Plan -Check CBC with differential, , BNP 11/11/2022 -Check blood alpha-1 antitrypsin phenotype -Check blood IgA, IgM, IgG and IgE -Check echocardiogram in the next of 4-8 weeks -Check CT chest without contrast in the next 4-8 weeks -Do follow-up with your ENT physician  -Get IM Depo-Medrol 80 mg x 1 today in the office - Take doxycycline 100mg  po twice daily x 7 days; take after meals and avoid sunlight - Take prednisone 40 mg daily x 2 days, then 20mg  daily x 2 days, then 10mg  daily x 2 days, then 5mg  daily baseline dose to continue  Follow-up - 4-8 weeks with Dr. Durel Salts or nurse practitioner to discuss test results and further course  - considr dupixent or daliresp

## 2022-11-11 NOTE — Addendum Note (Signed)
Addended by: Hedda Slade on: 11/11/2022 04:50 PM   Modules accepted: Orders

## 2022-11-11 NOTE — Addendum Note (Signed)
Addended by: Hedda Slade on: 11/11/2022 04:32 PM   Modules accepted: Orders

## 2022-11-12 ENCOUNTER — Other Ambulatory Visit (INDEPENDENT_AMBULATORY_CARE_PROVIDER_SITE_OTHER): Payer: Medicare PPO

## 2022-11-12 ENCOUNTER — Encounter: Payer: Self-pay | Admitting: Oncology

## 2022-11-12 DIAGNOSIS — J441 Chronic obstructive pulmonary disease with (acute) exacerbation: Secondary | ICD-10-CM

## 2022-11-12 DIAGNOSIS — L820 Inflamed seborrheic keratosis: Secondary | ICD-10-CM | POA: Diagnosis not present

## 2022-11-12 DIAGNOSIS — L905 Scar conditions and fibrosis of skin: Secondary | ICD-10-CM | POA: Diagnosis not present

## 2022-11-12 DIAGNOSIS — L738 Other specified follicular disorders: Secondary | ICD-10-CM | POA: Diagnosis not present

## 2022-11-12 DIAGNOSIS — Z85828 Personal history of other malignant neoplasm of skin: Secondary | ICD-10-CM | POA: Diagnosis not present

## 2022-11-12 DIAGNOSIS — L570 Actinic keratosis: Secondary | ICD-10-CM | POA: Diagnosis not present

## 2022-11-12 DIAGNOSIS — R0609 Other forms of dyspnea: Secondary | ICD-10-CM | POA: Diagnosis not present

## 2022-11-12 DIAGNOSIS — L821 Other seborrheic keratosis: Secondary | ICD-10-CM | POA: Diagnosis not present

## 2022-11-12 LAB — CBC WITH DIFFERENTIAL/PLATELET
Basophils Absolute: 0 10*3/uL (ref 0.0–0.1)
Basophils Relative: 0.3 % (ref 0.0–3.0)
Eosinophils Absolute: 0.1 10*3/uL (ref 0.0–0.7)
Eosinophils Relative: 1.9 % (ref 0.0–5.0)
HCT: 41.8 % (ref 39.0–52.0)
Hemoglobin: 14.1 g/dL (ref 13.0–17.0)
Lymphocytes Relative: 10.4 % — ABNORMAL LOW (ref 12.0–46.0)
Lymphs Abs: 0.5 10*3/uL — ABNORMAL LOW (ref 0.7–4.0)
MCHC: 33.7 g/dL (ref 30.0–36.0)
MCV: 88.6 fl (ref 78.0–100.0)
Monocytes Absolute: 0.3 10*3/uL (ref 0.1–1.0)
Monocytes Relative: 6.7 % (ref 3.0–12.0)
Neutro Abs: 3.9 10*3/uL (ref 1.4–7.7)
Neutrophils Relative %: 80.7 % — ABNORMAL HIGH (ref 43.0–77.0)
Platelets: 196 10*3/uL (ref 150.0–400.0)
RBC: 4.72 Mil/uL (ref 4.22–5.81)
RDW: 13.3 % (ref 11.5–15.5)
WBC: 4.8 10*3/uL (ref 4.0–10.5)

## 2022-11-12 LAB — BRAIN NATRIURETIC PEPTIDE: Pro B Natriuretic peptide (BNP): 62 pg/mL (ref 0.0–100.0)

## 2022-11-18 LAB — IGG: IgG (Immunoglobin G), Serum: 721 mg/dL (ref 600–1540)

## 2022-11-18 LAB — IGA: Immunoglobulin A: 191 mg/dL (ref 70–320)

## 2022-11-18 LAB — IGM: IgM, Serum: 98 mg/dL (ref 50–300)

## 2022-11-20 DIAGNOSIS — M25551 Pain in right hip: Secondary | ICD-10-CM | POA: Diagnosis not present

## 2022-11-20 DIAGNOSIS — M1611 Unilateral primary osteoarthritis, right hip: Secondary | ICD-10-CM | POA: Diagnosis not present

## 2022-11-21 LAB — ALPHA-1 ANTITRYPSIN PHENOTYPE: A-1 Antitrypsin, Ser: 141 mg/dL (ref 83–199)

## 2022-11-22 ENCOUNTER — Telehealth: Payer: Self-pay | Admitting: Pharmacist

## 2022-11-22 DIAGNOSIS — M069 Rheumatoid arthritis, unspecified: Secondary | ICD-10-CM | POA: Diagnosis not present

## 2022-11-22 DIAGNOSIS — J439 Emphysema, unspecified: Secondary | ICD-10-CM

## 2022-11-22 DIAGNOSIS — M0579 Rheumatoid arthritis with rheumatoid factor of multiple sites without organ or systems involvement: Secondary | ICD-10-CM | POA: Diagnosis not present

## 2022-11-22 DIAGNOSIS — I10 Essential (primary) hypertension: Secondary | ICD-10-CM

## 2022-11-22 NOTE — Telephone Encounter (Signed)
PharmD reviewed patient chart to assess eligibility for [facility] Coordination services. Patient was determined to be a good candidate for the program given the complexity of the medication regimen and/or overall risk for hospitalization and increased utilization.  Referral entered in order to outreach patient and offer appointment with PharmD. Referral cosigned to PCP.  

## 2022-11-26 ENCOUNTER — Telehealth: Payer: Self-pay

## 2022-11-26 NOTE — Progress Notes (Signed)
Care Management & Coordination Services Pharmacy Team  Reason for Encounter: Appointment Reminder  Contacted patient to confirm in office appointment with Al Corpus , PharmD on 11/27/22 at 11:00. Unsuccessful outreach. Left voicemail for patient to return call. Reminded of location [facility]  Have you seen any other providers since your last visit with PCP? Yes Orthopedic,pulmonology,nephrology,podiatry,pain,  Chart review:  Recent office visits:  05/22/22-Richard Letvak,MD(PCP)-[facility], discuss screenings,vaccines,flu shot given, Edema gone off amlodipine,recommend tetanus,covid,shingrix. F/u 1 year   Recent consult visits:  11/20/22-Molly Hartzler,MD(ortho)-Rt. Hip PT 11/11/22-Murali Ramaswamy,MD(pulmo)-COPD-cough,labs(no notes), cortisone injection,take prednisone 40mg   taper dose ,start doxycycline 100mg  for 7 days. F/u 4-8 weeks 10/29/22-Tauseef Syed([facility])- no data 10/17/22-[facility] (Nephro)- no data 09/30/22-Dekarlos Dial,DPM- cold feet,referral for neurology 09/10/22-Nikita Desai,MD(pulmo)-f/u bronchitis-continue inhalers,continue nasal spray.concerned TNF-alpha inhibitor with increased risk of respiratory infections up to 22% could be contributing to his symptoms. F/u 6 months 08/14/22-Dr.Semelka (geriatric)-f/u,consider bone density testing,stop gabapentin ,wear abdominal binder,f/u 6 months  08/13/22-Douglas McQuaid,MD(pulmo)-acute cough,prednisone 40mg  X5 days,start augmentin 5 days, stop amoxicillin , chest xray,recommend alternative therapy for RA. F/u 1 month. 06/06/22-Tammy Burke,NP(pain)-neck pain, wear soft collar,use OTC tylenol arthritis,consider PT,rx for prednisone 10mg  6 day,f/u 8-10 weeks  06/04/22-Katherine Cobb,NP(pulmo)-f/u bronchitis-stable,continue all inhalers and medications, no changes f/u 3 months.  Hospital visits:  None in previous 6 months   Star Rating Drugs:  Medication:  Last Fill: Day Supply Losartan  100mg  09/19/22 90 Rosuvastatin 10mg  09/13/22  90   Care Gaps: Annual wellness visit in last year? Yes   Al Corpus, PharmD notified  Burt Knack, [facility] Clinical Pharmacy Assistant [phone]

## 2022-11-27 ENCOUNTER — Ambulatory Visit: Payer: Medicare PPO | Admitting: Pharmacist

## 2022-11-27 DIAGNOSIS — I2510 Atherosclerotic heart disease of native coronary artery without angina pectoris: Secondary | ICD-10-CM

## 2022-11-27 LAB — LAB REPORT - SCANNED: HM Hepatitis Screen: NEGATIVE

## 2022-11-27 NOTE — Progress Notes (Signed)
Care Management & Coordination Services Pharmacy Note  11/27/2022 Name:  Thomas Hudson MRN:  [id number] DOB:  [birth year]  Summary: Initial OV -HLD/nonobstructive CAD: LDL 69 (10/2019) while pt was on rosuvastatin 10 mg; he has not taken rosuvastatin in over 2 years; per cardiac CT 08/2019 he had elevated calcium score and nonobstructive CAD, aggressive lipid management (LDL < 70) was recommended at that time -COPD: pt has had multiple exacerbations over past 6 months, mainly linked to increased allergies; he is compliant with Markus Daft, Daliresp, montelukast, cetirizine and albuterol; discussed Cimzia (for RA) increases risk for respiratory infections and may contribute to frequent exacerbations, he has not discussed this with rheumatologist  Recommendations/Changes made from today's visit: -Restart rosuvastatin 10 mg daily; check lipids at CPE -Advised to discuss possible Cimzia alternatives with Rheumatology -Pt requested refills for cetirizine, montelukast, omeprazole, potassium, ferrous sulfate, albuterol neb; team will contact pharmacy as refills should be on file  Follow up plan: -Health Concierge will call pharmacy for refills -Pharmacist follow up PRN -ECHO 12/16/22; CT chest 12/16/22; Pulm 12/31/22; PCP 05/27/23    Subjective: Thomas Hudson is an 82 y.o. year old male who is a primary patient of Letvak, Berneda Rose, MD.  The care coordination team was consulted for assistance with disease management and care coordination needs.    Engaged with patient face to face for initial visit. Patient presented with his daughter [name]. They bring a spreadsheet with all medications, directions, refill numbers.  Recent office visits: 05/22/22 Dr Alphonsus Sias OV: annual - K 3.2, increase Kcl to BID. Vit D 17, start 1000 IU daily. D/c amlodipine (edema)  Recent consult visits: 11/11/22 Dr Marchelle Gearing [facility]): copd exacerbation. Check immunology panel. Given IM solu medrol, doxy x 7 days and prednisone burst.  Consider dupixent or daliresp.  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 Start tylenol and cane use.   10/29/22 Dr Kathi Ludwig (Rheum): noted pt tolerating cimiza well, patient said nothing unusual. Continue therapy.  10/17/22 Dr Elenore Rota (Nephrology): New pt. check renal US.  09/10/22 Dr Celine Mans [facility]): COPD w/ frequent exacerbations; concern that Cimzia is contributing - consult Dr Kathi Ludwig for alternative  08/14/22 Dr Myrle Sheng (Geriatric): stop gabapentin given association w/ COPD flare.   Hospital visits: None in previous 6 months   Objective:  Lab Results  Component Value Date   CREATININE 1.39 07/18/2022   BUN 17 07/18/2022   GFR 48.21 (L) 07/18/2022   GFRNONAA 35 (L) 05/09/2022   GFRAA 73 08/06/2019   NA 139 07/18/2022   K 3.0 (L) 07/18/2022   CALCIUM 9.5 07/18/2022   CO2 28 07/18/2022   GLUCOSE 126 (H) 07/18/2022    Lab Results  Component Value Date/Time   HGBA1C 5.7 04/04/2014 10:47 AM   HGBA1C 6.0 02/27/2012 11:36 AM   GFR 48.21 (L) 07/18/2022 09:06 AM   GFR 54.30 (L) 05/22/2022 09:35 AM    Last diabetic Eye exam: No results found for: "HMDIABEYEEXA"  Last diabetic Foot exam: No results found for: "HMDIABFOOTEX"   Lab Results  Component Value Date   CHOL 140 10/13/2019   HDL 54 10/13/2019   LDLCALC 69 10/13/2019   LDLDIRECT 156.2 08/08/2010   TRIG 87 10/13/2019   CHOLHDL 2.6 10/13/2019       Latest Ref Rng & Units 07/18/2022    9:06 AM 05/22/2022    9:35 AM 05/09/2022    5:17 PM  Hepatic Function  Total Protein 6.5 - 8.1 g/dL   6.8  Albumin 3.5 - 5.2 g/dL 3.9  3.8  3.7   AST 15 - 41 U/L   15   ALT 0 - 44 U/L   17   Alk Phosphatase 38 - 126 U/L   46   Total Bilirubin 0.3 - 1.2 mg/dL   0.5     Lab Results  Component Value Date/Time   TSH 1.42 04/04/2014 10:47 AM   TSH 0.89 04/02/2013 11:39 AM   FREET4 0.78 05/15/2021 08:49 AM       Latest Ref Rng & Units 11/12/2022   10:05 AM 05/09/2022    5:17 PM 07/04/2021   11:17 AM  CBC   WBC 4.0 - 10.5 K/uL 4.8  6.5  6.8   Hemoglobin 13.0 - 17.0 g/dL 16.1  09.6  04.5   Hematocrit 39.0 - 52.0 % 41.8  37.9  47.4   Platelets 150.0 - 400.0 K/uL 196.0  328  253    Iron/TIBC/Ferritin/ %Sat    Component Value Date/Time   IRON 88 02/20/2017 0951   TIBC 274 02/20/2017 0951   FERRITIN 40 02/20/2017 0951   IRONPCTSAT 32 02/20/2017 0951    Lab Results  Component Value Date/Time   VD25OH 17.03 (L) 05/22/2022 09:35 AM   VITAMINB12 295 05/15/2021 08:49 AM   VITAMINB12 520 11/19/2016 11:31 AM   VITAMINB12 >1500 (H) 04/04/2014 10:47 AM    Clinical ASCVD: No  The ASCVD Risk score (Arnett DK, et al., 2019) failed to calculate for the following reasons:   Cannot find a previous HDL lab   Cannot find a previous total cholesterol lab        05/22/2022    9:11 AM 05/15/2021    8:28 AM 05/12/2020    9:06 AM  Depression screen PHQ 2/9  Decreased Interest 0 0 0  Down, Depressed, Hopeless 0 0 0  PHQ - 2 Score 0 0 0     Social History   Tobacco Use  Smoking Status Former   Packs/day: 2.00   Years: 50.00   Additional pack years: 0.00   Total pack years: 100.00   Types: Cigarettes   Quit date: 07/08/1997   Years since quitting: 25.4   Passive exposure: Past  Smokeless Tobacco Never   BP Readings from Last 3 Encounters:  11/11/22 126/60  09/10/22 128/74  08/13/22 126/72   Pulse Readings from Last 3 Encounters:  11/11/22 (!) 104  09/10/22 95  08/13/22 80   Wt Readings from Last 3 Encounters:  11/11/22 192 lb 12.8 oz (87.5 kg)  09/10/22 203 lb (92.1 kg)  08/13/22 202 lb 9.6 oz (91.9 kg)   BMI Readings from Last 3 Encounters:  11/11/22 30.20 kg/m  09/10/22 31.79 kg/m  08/13/22 31.73 kg/m    Allergies  Allergen Reactions   Morphine And Codeine Other (See Comments)    Itching & rash Because of a history of documented adverse serious drug reaction;Medi Alert bracelet  is recommended    Medications Reviewed Today     Reviewed by Kathyrn Sheriff, RPH  (Pharmacist) on 11/27/22 at 1356  Med List Status: <None>   Medication Order Taking? Sig Documenting Provider Last Dose Status Informant  albuterol (PROVENTIL) (2.5 MG/3ML) 0.083% nebulizer solution [id number] Yes Take 3 mLs (2.5 mg total) by nebulization every 6 (six) hours as needed for wheezing or shortness of breath. Charlott Holler, MD Taking Active   albuterol (VENTOLIN HFA) 108 [phone] Base) MCG/ACT inhaler [id number] Yes Inhale 2 puffs into the lungs every 6 (six)  hours as needed for wheezing or shortness of breath. Charlott Holler, MD Taking Active   alfuzosin (UROXATRAL) 10 MG 24 hr tablet [id number] Yes TAKE 1 TABLET(10 MG) BY MOUTH DAILY Karie Schwalbe, MD Taking Active   azelastine (ASTELIN) 0.1 % nasal spray [id number] Yes Place 1 spray into both nostrils 2 (two) times daily. Use in each nostril as directed Cobb, Ruby Cola, NP Taking Active   betamethasone, augmented, (DIPROLENE) 0.05 % lotion [id number] Yes Apply 1 application. topically daily. [provider] Taking Active   BREZTRI AEROSPHERE 160-9-4.8 MCG/ACT Sandrea Matte [id number] Yes Inhale 2 puffs into the lungs 2 (two) times daily. Cobb, Ruby Cola, NP Taking Active   Certolizumab Pegol (CIMZIA) 2 X 200 MG KIT [id number] Yes Inject 2 each into the skin every 30 (thirty) days. [provider] Taking Active Self  cetirizine (ZYRTEC) 10 MG tablet [id number] Yes Take 1 tablet (10 mg total) by mouth at bedtime. Karie Schwalbe, MD Taking Active   chlorthalidone (HYGROTON) 25 MG tablet [id number] Yes Take 25 mg by mouth daily. [provider] Taking Active   doxycycline (MONODOX) 50 MG capsule [id number] Yes Take 50 mg by mouth daily. [provider] Taking Active Self  erythromycin ophthalmic ointment [id number] Yes Place 1 application into both eyes daily as needed (dry eyes). [provider] Taking Active Self  ferrous sulfate 325 (65 FE) MG tablet [id number] Yes Take 1 tablet (325 mg total) by mouth 3  (three) times daily with meals.  Patient taking differently: Take 325 mg by mouth every other day.   Karie Schwalbe, MD Taking Active   finasteride (PROSCAR) 5 MG tablet [id number] Yes Take 1 tablet (5 mg total) by mouth daily. Karie Schwalbe, MD Taking Active   fluticasone Aleda Grana) 50 MCG/ACT nasal spray [id number] Yes Place 2 sprays into both nostrils daily as needed for allergies. Karie Schwalbe, MD Taking Active   folic acid (FOLVITE) 1 MG tablet [id number] Yes Take 1 tablet (1 mg total) by mouth daily. Karie Schwalbe, MD Taking Active   losartan (COZAAR) 100 MG tablet [id number] Yes Take 1 tablet (100 mg total) by mouth daily. Karie Schwalbe, MD Taking Active   methylPREDNISolone acetate (DEPO-MEDROL) injection 80 mg [id number]   Kalman Shan, MD  Active   montelukast (SINGULAIR) 10 MG tablet [id number] Yes Take 1 tablet (10 mg total) by mouth at bedtime. Karie Schwalbe, MD Taking Active   Multiple Vitamin (MULTIVITAMIN WITH MINERALS) TABS tablet [id number] Yes Take 1 tablet by mouth daily. [provider] Taking Active Self  omeprazole (PRILOSEC) 20 MG capsule [id number] Yes Take 1 capsule (20 mg total) by mouth daily. Karie Schwalbe, MD Taking Active   potassium chloride SA (KLOR-CON M) 20 MEQ tablet [id number] Yes Take 20 mEq by mouth daily. [provider] Taking Active   predniSONE (DELTASONE) 20 MG tablet [id number] Yes Take 2 tablets (40 mg total) by mouth daily with breakfast. Lupita Leash, MD Taking Active   predniSONE (DELTASONE) 5 MG tablet [id number] Yes Take 1 tablet (5 mg total) by mouth See admin instructions. Take 1 tablet daily Karie Schwalbe, MD Taking Active   roflumilast (DALIRESP) 500 MCG TABS tablet [id number] Yes Take 1 tablet (500 mcg total) by mouth daily. Noemi Chapel, NP Taking Active             SDOH:  (Social Determinants of Health) assessments and interventions performed: Yes SDOH Interventions    Flowsheet Row  Care  Coordination from 11/27/2022 in [facility]  SDOH Interventions   Food Insecurity Interventions Intervention Not Indicated  Housing Interventions Intervention Not Indicated  Transportation Interventions Intervention Not Indicated  Utilities Interventions Intervention Not Indicated  Financial Strain Interventions Intervention Not Indicated       Medication Assistance: None required.  Patient affirms current coverage meets needs.  Medication Access: Name and location of current pharmacy:  [facility] - [place], [place] - [address] [place] [address] Phone: [phone] Fax: [phone]  [id number] - [place], [place] - [address] AT [facility] OF [address] [place] [address] Phone: [phone] Fax: [phone]  Within the past 30 days, how often has patient missed a dose of medication? 0 Is a pillbox or other method used to improve adherence? Yes  Factors that may affect medication adherence? no barriers identified Are meds synced by current pharmacy? No  Are meds delivered by current pharmacy? No  Does patient experience delays in picking up medications due to transportation concerns? No   Compliance/Adherence/Medication fill history: Care Gaps: None  Star-Rating Drugs: Rosuvastatin - PDC 0% Losartan - PDC 93%   Assessment/Plan  Hypertension (BP goal <130/80) -Controlled  - pt is not monitoring BP at home, recent BP in clinic at goal -Current home readings: none available -Current treatment: Chlorthalidone 25 mg daily - Appropriate, Effective, Safe, Accessible Losartan 100 mg daily -Appropriate, Effective, Safe, Accessible -Medications previously tried: amlodipine  -Educated on BP goals and benefits of medications for prevention of heart attack, stroke and kidney damage; Importance of home blood pressure monitoring; Symptoms of hypotension  and importance of maintaining adequate hydration; -Counseled to monitor BP at home daily, contact provider if SBP <100s -Recommended to continue current medication  COPD (Goal: control symptoms and prevent exacerbations) -Not ideally controlled - pt has multiple exacerbations, seemingly related to allergies; he is currently undergoing allergy workup with pulm and consider dupixent -Follows with pulmonary (D Ramaswamy) -Hx: 100 pack-year smoking hx, quit 25 years ago -Gold Grade: Gold 1 (FEV1>80%) -Current COPD Classification:  E (exacerbation leading to hospitalization OR 2+ moderate exacerbations) -Pulmonary function testing: 07/2020 - FEV1 85% pred, 0% change post-BD; ratio 0.87; no airflow limitation -Exacerbations requiring treatment in last 6 months: 3 -Current treatment  Roflumilast 500 mcg daily - Appropriate, Effective, Safe, Accessible Breztri 2 puff BID -Appropriate, Effective, Safe, Accessible Albuterol HFA prn Appropriate, Effective, Safe, Accessible Albuterol neb PRN -Appropriate, Effective, Safe, Accessible Prednisone 5 mg daily -Appropriate, Effective, Safe, Accessible Montelukast 10 mg daily -Appropriate, Effective, Safe, Accessible Cetirizine 10 mg daily -Appropriate, Effective, Safe, Accessible -Medications previously tried: n/a  -Patient reports consistent use of maintenance inhaler -Frequency of rescue inhaler use: 1-2 times daily -Counseled on Proper inhaler technique; Benefits of consistent maintenance inhaler use; When to use rescue inhaler -Discussed Cimzia (and most other DMARDs) increase risk for respiratry infection and may contribute to frequent COPD exacerbations; advised to discuss this with rheumatology and possible alternatives -Recommended to continue current medication; follow up with pulm as scheduled  Rheumatoid arthritis (Goal: manage pain) -Controlled - pt reports RA under good control with Cimzia and prednisone -Follows with Dr Kathi Ludwig -Current  treatment  Cimzia 400 mg SQ q4 weeks Prednisone 5 mg daily -Medications previously tried: MTX, leflunomide   -Recommended to continue current medication  Hyperlipidemia: (LDL goal < 70) -Query Controlled - LDL 69 (10/2019), pt is no longer  taking rosuvastatin (never renewed initial Rx) -Coronary CT 08/2019 - Ca score 202, 44th percentile. Mild/moderate CAD. Advised aggressive lipid lowering at that time. -Current treatment: Rosuvastatin 10 mg daily - not taking. LF 09/2020 -Medications previously tried: n/a -Educated on Cholesterol goals; Benefits of statin for ASCVD risk reduction; -Restart rosuvastatin 10 mg daily; check lipid panel at CPE  Chronic Kidney Disease Stage 3b  -All medications assessed for renal dosing and appropriateness in chronic kidney disease. -Established with Dr Elenore Rota 10/2022 -Reviewed to avoid NSAIDs, maintain adequate hydration -Recommended to continue current medication  Health Maintenance -Vaccine gaps: Shingrix -GERD: on omeprazole 20 mg -BPH: on alfuzosin, finasteride -Derm: doxycyline 50 mg daily   Al Corpus, PharmD, [place], CPP Clinical Pharmacist Practitioner [facility] at [facility] [phone]

## 2022-11-27 NOTE — Patient Instructions (Signed)
Visit Information  Phone number for Pharmacist: [phone]  Thank you for meeting with me to discuss your medications! I look forward to working with you to achieve your health care goals. Below is a summary of what we talked about during the visit:  Talk with your rheumatologist about the Cimzia and frequent respiratory infections / COPD flare ups.  Monitor blood pressure, let us know if it is consistently low 100s on top.   Al Corpus, PharmD, BCACP Clinical Pharmacist [facility] at [facility] [phone]

## 2022-11-29 ENCOUNTER — Encounter: Payer: Medicare PPO | Admitting: Pharmacist

## 2022-11-29 MED ORDER — ROSUVASTATIN CALCIUM 10 MG PO TABS
10.0000 mg | ORAL_TABLET | Freq: Every day | ORAL | 1 refills | Status: DC
Start: 2022-11-29 — End: 2023-07-08

## 2022-12-03 ENCOUNTER — Telehealth: Payer: Self-pay

## 2022-12-03 NOTE — Telephone Encounter (Signed)
Per [facility], they are in need of albuterol nebulizer refills.  This is prescribed by pulmonology - Dr Celine Mans. Last eRx 09/25/22 #360 ml with 3 refills. It is not clear why the pharmacy does not have these refills on file. Regardless, requesting new eRx.  Routing to pulmonology for assistance.  [facility] - [place], [place] - [address] [place] [address] Phone: [phone] Fax: [phone]

## 2022-12-03 NOTE — Progress Notes (Signed)
Care Management & Coordination Services Pharmacy Team  Reason for Encounter: Medication  Refills confirmation    Spoke with  Pharmacist at [facility]   on 12/03/2022   The patient has refills on all medications requested. All the medications are active.  The one medication that does NOT have refills is Albuterol Nebulizer.   Al Corpus, PharmD notified  Burt Knack, [facility] Clinical Pharmacy Assistant [phone]

## 2022-12-03 NOTE — Telephone Encounter (Signed)
-----   Message from Kathyrn Sheriff, [facility] sent at 11/29/2022 11:28 AM EDT ----- Regarding: call [facility] Patient had several medications that needed refills, but it looks like the pharmacy should have some refills on file. Please call pharmacy and make sure these refills are "active" and ask if they can be refilled.  -Refill cetirizine, montelukast, omeprazole, potassium, ferrous sulfate, albuterol nebulizer

## 2022-12-05 MED ORDER — ALBUTEROL SULFATE (2.5 MG/3ML) 0.083% IN NEBU: 2.5000 mg | INHALATION_SOLUTION | Freq: Four times a day (QID) | RESPIRATORY_TRACT | 3 refills | Status: DC | PRN

## 2022-12-05 NOTE — Telephone Encounter (Signed)
Added diagnosis code to the albuterol neb sol Rx to see if that might help with pt getting the Rx. Rx resent to [facility]. Nothing further needed.

## 2022-12-05 NOTE — Addendum Note (Signed)
Addended by: Wyvonne Lenz on: 12/05/2022 11:38 AM   Modules accepted: Orders

## 2022-12-13 ENCOUNTER — Encounter: Payer: Self-pay | Admitting: Oncology

## 2022-12-16 ENCOUNTER — Ambulatory Visit (HOSPITAL_BASED_OUTPATIENT_CLINIC_OR_DEPARTMENT_OTHER): Payer: Medicare PPO

## 2022-12-16 ENCOUNTER — Encounter (HOSPITAL_BASED_OUTPATIENT_CLINIC_OR_DEPARTMENT_OTHER): Payer: Self-pay

## 2022-12-16 ENCOUNTER — Ambulatory Visit (HOSPITAL_BASED_OUTPATIENT_CLINIC_OR_DEPARTMENT_OTHER)
Admission: RE | Admit: 2022-12-16 | Discharge: 2022-12-16 | Disposition: A | Discharge status: Home / Self Care | Payer: Medicare PPO | Source: Ambulatory Visit | Attending: Internal Medicine | Admitting: Internal Medicine

## 2022-12-16 DIAGNOSIS — J441 Chronic obstructive pulmonary disease with (acute) exacerbation: Secondary | ICD-10-CM | POA: Diagnosis not present

## 2022-12-16 DIAGNOSIS — R0602 Shortness of breath: Secondary | ICD-10-CM | POA: Diagnosis not present

## 2022-12-16 DIAGNOSIS — R0609 Other forms of dyspnea: Secondary | ICD-10-CM

## 2022-12-16 LAB — ECHOCARDIOGRAM COMPLETE
Area-P 1/2: 2.95 cm2
S' Lateral: 3.13 cm

## 2022-12-20 DIAGNOSIS — M0579 Rheumatoid arthritis with rheumatoid factor of multiple sites without organ or systems involvement: Secondary | ICD-10-CM | POA: Diagnosis not present

## 2022-12-31 ENCOUNTER — Ambulatory Visit (INDEPENDENT_AMBULATORY_CARE_PROVIDER_SITE_OTHER): Payer: Medicare PPO | Admitting: Internal Medicine

## 2022-12-31 ENCOUNTER — Encounter: Payer: Self-pay | Admitting: Internal Medicine

## 2022-12-31 VITALS — BP 122/74 | HR 92 | Temp 97.8°F | Ht 66.0 in | Wt 186.0 lb

## 2022-12-31 DIAGNOSIS — J439 Emphysema, unspecified: Secondary | ICD-10-CM | POA: Diagnosis not present

## 2022-12-31 DIAGNOSIS — M069 Rheumatoid arthritis, unspecified: Secondary | ICD-10-CM | POA: Diagnosis not present

## 2022-12-31 DIAGNOSIS — J4489 Other specified chronic obstructive pulmonary disease: Secondary | ICD-10-CM | POA: Diagnosis not present

## 2022-12-31 DIAGNOSIS — J320 Chronic maxillary sinusitis: Secondary | ICD-10-CM | POA: Diagnosis not present

## 2022-12-31 DIAGNOSIS — J440 Chronic obstructive pulmonary disease with acute lower respiratory infection: Secondary | ICD-10-CM

## 2022-12-31 NOTE — Patient Instructions (Addendum)
Please schedule follow up scheduled with myself in 4 months.  If my schedule is not open yet, we will contact you with a reminder closer to that time. Please call [phone] if you haven't heard from [place] a month before.   Stop roflumilast.   I will reach out to Dr. Kathi Ludwig about stopping Cimzia.   Please get some repeat breathing testing done at your convenience.   continue albuterol continue breztri  continue singulair, flonase, zyrtec for rhinitis, with nasal saline. 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

## 2022-12-31 NOTE — Progress Notes (Signed)
Thomas Hudson    [id number]    04-24-1941  Primary Care Physician:Letvak, Berneda Rose, MD Date of Appointment: 12/31/2022 Established Patient Visit  Chief complaint:   Chief Complaint  Patient presents with   Follow-up    Chronic productive cough, SOB      HPI: Thomas Hudson is a 82 y.o. gentleman with history of allergic rhinitis who presented Nov 2021 with shortness of breath and was started on albuterol prn. Diagnosed with COPD. Symptom onset was around 2009, no childhood respiratory disease or asthma. Frequent exacerbations.   Interval Updates: Here for follow up. Has seen Dr. Marchelle Gearing in the interim for another COPD exacerbation. Doxycycline and prednisone. Echo and CT Chest were also ordered. Echo reviewed shows grade 1 diastolic dysfunction and ct chest is unremarkable - mild peribronchial thickening no significant emphysema  On Breztri for maintenance with prn albuterol nebulizers. Also on daliresp. Not sure this is helping much.   Requires prednisone every couple of months. Chronic sinusitis. On flonase, astelin, singulair, zyrtec.   At baseline on 5 mg prednisone for his RA. Taking infusion therapy for his RA - cimzia TNF-alpha inhibitor - at [facility] - sees Dr. Kathi Ludwig.    Social history: 100 pack year history  I have reviewed the patient's family social and past medical history and updated as appropriate.   Past Medical History:  Diagnosis Date   BPH (benign prostatic hyperplasia)    Carpal tunnel syndrome on both sides    Complication of anesthesia    Low BP and O2 sats after EGD   COPD (chronic obstructive pulmonary disease) (HCC)    GERD (gastroesophageal reflux disease)    Hyperlipidemia    Hypertension    RAD (reactive airway disease)    no PMH of asthma; RAD post infection   Rheumatoid arthritis (HCC)         Past Surgical History:  Procedure Laterality Date   BIOPSY  01/06/2018   Procedure: BIOPSY;  Surgeon: Hilarie Fredrickson, MD;   Location: [facility];  Service: Endoscopy;;   BIOPSY  01/03/2020   Procedure: BIOPSY;  Surgeon: Hilarie Fredrickson, MD;  Location: [facility];  Service: Endoscopy;;   CATARACT EXTRACTION W/PHACO Left 09/11/2021   Procedure: CATARACT EXTRACTION PHACO AND INTRAOCULAR LENS PLACEMENT (IOC) LEFT 6.38 00:41.6;  Surgeon: Galen Manila, MD;  Location: [facility];  Service: Ophthalmology;  Laterality: Left;   CATARACT EXTRACTION W/PHACO Right 09/25/2021   Procedure: CATARACT EXTRACTION PHACO AND INTRAOCULAR LENS PLACEMENT (IOC) RIGHT 8.01 00:49.9;  Surgeon: Galen Manila, MD;  Location: [facility];  Service: Ophthalmology;  Laterality: Right;   COLONOSCOPY W/ POLYPECTOMY  2009   X 2; Dr.Perry    ERCP N/A 11/05/2016   Procedure: ENDOSCOPIC RETROGRADE CHOLANGIOPANCREATOGRAPHY (ERCP);  Surgeon: Hilarie Fredrickson, MD;  Location: [facility];  Service: Endoscopy;  Laterality: N/A;   ESOPHAGOGASTRODUODENOSCOPY (EGD) WITH PROPOFOL N/A 07/29/2016   Procedure: ESOPHAGOGASTRODUODENOSCOPY (EGD) WITH PROPOFOL;  Surgeon: Hilarie Fredrickson, MD;  Location: [facility];  Service: Endoscopy;  Laterality: N/A;  will need ERCP scope   ESOPHAGOGASTRODUODENOSCOPY (EGD) WITH PROPOFOL N/A 01/06/2018   Procedure: ESOPHAGOGASTRODUODENOSCOPY (EGD) WITH PROPOFOL;  Surgeon: Hilarie Fredrickson, MD;  Location: [facility];  Service: Endoscopy;  Laterality: N/A;  NEED ERCP SCOPE   ESOPHAGOGASTRODUODENOSCOPY (EGD) WITH PROPOFOL N/A 01/03/2020   Procedure: ESOPHAGOGASTRODUODENOSCOPY (EGD) WITH PROPOFOL;  Surgeon: Hilarie Fredrickson, MD;  Location: [facility];  Service: Endoscopy;  Laterality: N/A;  Needs  ERCP scope   HERNIA REPAIR     LUMBAR LAMINECTOMY  07/2008   UPPER GASTROINTESTINAL ENDOSCOPY      gastric polyp;Dr.Perry    VASECTOMY     WHIPPLE PROCEDURE  2012   partial ; [facility]    Family History  Problem Relation Age of Onset   Skin cancer Mother    Throat cancer Father        smoker   Esophageal cancer Father    Uterine  cancer Sister    COPD Sister        X2   Ovarian cancer Sister    Throat cancer Sister        smoker   Breast cancer Sister    Lung cancer Sister        smoker   Leukemia Sister    Coronary artery disease Brother        in 67s   Lung cancer Brother        kidney cancer   Throat cancer Brother    Heart disease Brother    Heart disease Brother    Heart disease Maternal Grandfather    Stroke Paternal Grandmother 67   Colon cancer Paternal Grandfather    Heart disease Paternal Grandfather    Prostate cancer Paternal Uncle    Heart attack Paternal Uncle        3 uncles, >55   Asthma Neg Hx     Social History   Occupational History   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  Tobacco Use   Smoking status: Former    Packs/day: 2.00    Years: 50.00    Additional pack years: 0.00    Total pack years: 100.00    Types: Cigarettes    Quit date: 07/08/1997    Years since quitting: 25.4    Passive exposure: Past   Smokeless tobacco: Never  Vaping Use   Vaping Use: Never used  Substance and Sexual Activity   Alcohol use: No    Alcohol/week: 0.0 standard drinks of alcohol   Drug use: No   Sexual activity: Yes     Physical Exam: Blood pressure 122/74, pulse 92, temperature 97.8 F (36.6 C), temperature source Oral, height 5\' 6"  (1.676 m), weight 186 lb (84.4 kg), SpO2 95 %.  Gen:     NAD Lungs:   breath sounds diminished, no wheezes or crackles, on room air CV:       tachycardic, regular, no murmur   Data Reviewed: Imaging: I have personally reviewed the CT Chest June 2024 - shows mild chronic bronchitis changes predominant bibasilar.  Chest xray obtained feb 2024 - no acute process  Reviewed his CT maxillofacial from 2013 and 2017 with chronic sinusitis.   PFTs: Mild restriction to ventilation Jan 2022  Labs: Lab Results  Component Value Date   WBC 4.8 11/12/2022   HGB 14.1 11/12/2022   HCT 41.8 11/12/2022   MCV 88.6 11/12/2022    PLT 196.0 11/12/2022   Lab Results  Component Value Date   NA 139 07/18/2022   K 3.0 (L) 07/18/2022   CL 102 07/18/2022   CO2 28 07/18/2022     Immunization status: Immunization History  Administered Date(s) Administered   Fluad Quad(high Dose 65+) 05/12/2019, 05/12/2020, 05/22/2022   Influenza Split 06/25/2011, 04/15/2012   Influenza Whole 05/04/2009, 06/06/2010   Influenza, High Dose Seasonal PF 05/03/2013   Influenza, Quadrivalent, Recombinant, Inj, Pf 03/28/2021   Influenza,inj,Quad PF,6+ Mos 04/07/2014,  04/03/2015, 04/19/2016, 05/02/2017   Influenza,inj,Quad PF,6-35 Mos 05/13/2012, 05/31/2013, 04/07/2014   Influenza-Unspecified 06/25/2011, 04/15/2012, 05/13/2012, 04/07/2014, 04/21/2018, 04/11/2021   PFIZER Comirnaty(Gray Top)Covid-19 Tri-Sucrose Vaccine 07/30/2019, 08/20/2019   PFIZER(Purple Top)SARS-COV-2 Vaccination 07/30/2019, 08/20/2019   Pneumococcal Conjugate-13 04/07/2014   Pneumococcal Polysaccharide-23 05/04/2009, 04/07/2014, 05/02/2017   Td 08/08/2010   Td (Adult),5 Lf Tetanus Toxid, Preservative Free 08/08/2010   Unspecified SARS-COV-2 Vaccination 07/30/2019, 08/20/2019   Zoster, Live 08/10/2012    Assessment:  COPD FEV1 85% of predicted with high symptom burden and recurrent exacerbations, not well controlled.  Chronic allergic and non allergic sinusitis - controlled History of 100 pack year smoking, quit 1999 Rheumatoid Arthritis on TNF-alpha inhibitor  Plan/Recommendations: continue albuterol continue breztri  continue singulair, flonase, zyrtec for rhinitis, with nasal saline. continue astelin nasal spray for ongoing rhinitis Will stop daliresp as minimal benefit Would not start daily scheduled ppx azithromycin due to already being on doxycycline 50 mg daily for acne.  IgE 110 and eosinophils 100. Not substantially elevated but probably could get xolair qualified or dupixent. However before starting this would want to see if he can switch off cimzia.   I am concerned TNF-alpha inhibitor with increased risk of respiratory infections up to 22% could be contributing to his symptoms. I have called Dr Clarise Cruz office and left message for a call back.   Return to Care: Return in about 4 months (around 05/02/2023).   Durel Salts, MD Pulmonary and Critical Care Medicine [facility] Office:[phone]

## 2023-02-12 DIAGNOSIS — I129 Hypertensive chronic kidney disease with stage 1 through stage 4 chronic kidney disease, or unspecified chronic kidney disease: Secondary | ICD-10-CM | POA: Diagnosis not present

## 2023-02-12 DIAGNOSIS — N1831 Chronic kidney disease, stage 3a: Secondary | ICD-10-CM | POA: Diagnosis not present

## 2023-02-12 DIAGNOSIS — I10 Essential (primary) hypertension: Secondary | ICD-10-CM | POA: Diagnosis not present

## 2023-02-18 ENCOUNTER — Encounter: Payer: Self-pay | Admitting: Internal Medicine

## 2023-02-18 DIAGNOSIS — N400 Enlarged prostate without lower urinary tract symptoms: Secondary | ICD-10-CM | POA: Diagnosis not present

## 2023-02-18 DIAGNOSIS — E876 Hypokalemia: Secondary | ICD-10-CM | POA: Diagnosis not present

## 2023-02-18 DIAGNOSIS — Z87891 Personal history of nicotine dependence: Secondary | ICD-10-CM | POA: Diagnosis not present

## 2023-02-18 DIAGNOSIS — M5136 Other intervertebral disc degeneration, lumbar region: Secondary | ICD-10-CM | POA: Diagnosis not present

## 2023-02-18 DIAGNOSIS — M5030 Other cervical disc degeneration, unspecified cervical region: Secondary | ICD-10-CM | POA: Diagnosis not present

## 2023-02-18 DIAGNOSIS — Z79899 Other long term (current) drug therapy: Secondary | ICD-10-CM | POA: Diagnosis not present

## 2023-02-18 DIAGNOSIS — N1831 Chronic kidney disease, stage 3a: Secondary | ICD-10-CM | POA: Diagnosis not present

## 2023-02-18 DIAGNOSIS — M1990 Unspecified osteoarthritis, unspecified site: Secondary | ICD-10-CM | POA: Diagnosis not present

## 2023-02-18 DIAGNOSIS — M0600 Rheumatoid arthritis without rheumatoid factor, unspecified site: Secondary | ICD-10-CM | POA: Diagnosis not present

## 2023-02-18 DIAGNOSIS — I129 Hypertensive chronic kidney disease with stage 1 through stage 4 chronic kidney disease, or unspecified chronic kidney disease: Secondary | ICD-10-CM | POA: Diagnosis not present

## 2023-02-18 DIAGNOSIS — E673 Hypervitaminosis D: Secondary | ICD-10-CM | POA: Diagnosis not present

## 2023-02-18 DIAGNOSIS — M150 Primary generalized (osteo)arthritis: Secondary | ICD-10-CM | POA: Diagnosis not present

## 2023-03-25 ENCOUNTER — Encounter: Payer: Self-pay | Admitting: Internal Medicine

## 2023-03-26 ENCOUNTER — Other Ambulatory Visit: Payer: Self-pay

## 2023-03-26 DIAGNOSIS — R0609 Other forms of dyspnea: Secondary | ICD-10-CM

## 2023-03-26 DIAGNOSIS — Z87891 Personal history of nicotine dependence: Secondary | ICD-10-CM

## 2023-03-26 MED ORDER — ALBUTEROL SULFATE HFA 108 (90 BASE) MCG/ACT IN AERS: 2.0000 | INHALATION_SPRAY | Freq: Four times a day (QID) | RESPIRATORY_TRACT | 3 refills | Status: DC | PRN

## 2023-03-27 DIAGNOSIS — M0579 Rheumatoid arthritis with rheumatoid factor of multiple sites without organ or systems involvement: Secondary | ICD-10-CM | POA: Diagnosis not present

## 2023-03-28 LAB — LAB REPORT - SCANNED: EGFR: 55

## 2023-04-03 DIAGNOSIS — N403 Nodular prostate with lower urinary tract symptoms: Secondary | ICD-10-CM | POA: Diagnosis not present

## 2023-04-03 DIAGNOSIS — R351 Nocturia: Secondary | ICD-10-CM | POA: Diagnosis not present

## 2023-04-03 DIAGNOSIS — N401 Enlarged prostate with lower urinary tract symptoms: Secondary | ICD-10-CM | POA: Diagnosis not present

## 2023-04-24 DIAGNOSIS — M0579 Rheumatoid arthritis with rheumatoid factor of multiple sites without organ or systems involvement: Secondary | ICD-10-CM | POA: Diagnosis not present

## 2023-05-06 ENCOUNTER — Ambulatory Visit (HOSPITAL_BASED_OUTPATIENT_CLINIC_OR_DEPARTMENT_OTHER): Payer: Medicare PPO | Admitting: Internal Medicine

## 2023-05-06 DIAGNOSIS — J410 Simple chronic bronchitis: Secondary | ICD-10-CM

## 2023-05-06 LAB — PULMONARY FUNCTION TEST
DL/VA % pred: 121 %
DL/VA: 4.78 ml/min/mmHg/L
DLCO cor % pred: 91 %
DLCO cor: 20.43 ml/min/mmHg
DLCO unc % pred: 91 %
DLCO unc: 20.43 ml/min/mmHg
FEF 25-75 Post: 2.82 L/s
FEF 25-75 Pre: 2.49 L/s
FEF2575-%Change-Post: 13 %
FEF2575-%Pred-Post: 165 %
FEF2575-%Pred-Pre: 146 %
FEV1-%Change-Post: 3 %
FEV1-%Pred-Post: 98 %
FEV1-%Pred-Pre: 95 %
FEV1-Post: 2.46 L
FEV1-Pre: 2.39 L
FEV1FVC-%Change-Post: 5 %
FEV1FVC-%Pred-Pre: 114 %
FEV6-%Change-Post: -2 %
FEV6-%Pred-Post: 86 %
FEV6-%Pred-Pre: 88 %
FEV6-Post: 2.83 L
FEV6-Pre: 2.9 L
FEV6FVC-%Pred-Post: 107 %
FEV6FVC-%Pred-Pre: 107 %
FVC-%Change-Post: -2 %
FVC-%Pred-Post: 80 %
FVC-%Pred-Pre: 82 %
FVC-Post: 2.83 L
FVC-Pre: 2.9 L
Post FEV1/FVC ratio: 87 %
Post FEV6/FVC ratio: 100 %
Pre FEV1/FVC ratio: 82 %
Pre FEV6/FVC Ratio: 100 %
RV % pred: 74 %
RV: 1.87 L
TLC % pred: 77 %
TLC: 5.02 L

## 2023-05-06 NOTE — Progress Notes (Signed)
Full PFT Performed Today  

## 2023-05-06 NOTE — Patient Instructions (Signed)
Full PFT Performed Today  

## 2023-05-09 ENCOUNTER — Ambulatory Visit (INDEPENDENT_AMBULATORY_CARE_PROVIDER_SITE_OTHER): Payer: Medicare PPO | Admitting: Internal Medicine

## 2023-05-09 ENCOUNTER — Encounter: Payer: Self-pay | Admitting: Internal Medicine

## 2023-05-09 VITALS — BP 116/74 | HR 82 | Temp 98.6°F | Resp 16 | Ht 67.0 in | Wt 181.0 lb

## 2023-05-09 DIAGNOSIS — M05711 Rheumatoid arthritis with rheumatoid factor of right shoulder without organ or systems involvement: Secondary | ICD-10-CM

## 2023-05-09 DIAGNOSIS — Z23 Encounter for immunization: Secondary | ICD-10-CM | POA: Diagnosis not present

## 2023-05-09 DIAGNOSIS — Z87891 Personal history of nicotine dependence: Secondary | ICD-10-CM

## 2023-05-09 DIAGNOSIS — M0689 Other specified rheumatoid arthritis, multiple sites: Secondary | ICD-10-CM | POA: Diagnosis not present

## 2023-05-09 DIAGNOSIS — J301 Allergic rhinitis due to pollen: Secondary | ICD-10-CM | POA: Diagnosis not present

## 2023-05-09 NOTE — Patient Instructions (Addendum)
It was a pleasure to see you today!  Please schedule follow up scheduled with myself in 6 months.  If my schedule is not open yet, we will contact you with a reminder closer to that time. Please call [phone] if you haven't heard from [place] a month before, and always call us sooner if issues or concerns arise. You can also send [place] a message through MyChart, but but aware that this is not to be used for urgent issues and it may take up to 5-7 days to receive a reply. Please be aware that you will likely be able to view your results before I have a chance to respond to them. Please give [place] 5 business days to respond to any non-urgent results.    continue albuterol continue breztri  continue singulair, flonase, zyrtec for rhinitis, with nasal saline. continue astelin nasal spray for ongoing rhinitis I'm glad the breathing is better.   Flu shot today.  Stay active. Your pulmonary function testing is normal and unchanged from 2 years ago. This is great news.

## 2023-05-09 NOTE — Progress Notes (Signed)
Thomas Hudson    [id number]    1940-11-19  Primary Care Physician:Letvak, Berneda Rose, MD Date of Appointment: 05/09/2023 Established Patient Visit  Chief complaint:   Chief Complaint  Patient presents with   Follow-up    SOB      HPI: Thomas Hudson is a 82 y.o. gentleman with history of allergic rhinitis and COPD with high symptoms burden and frequent exacerbations. Symptom onset was around 2009, no childhood respiratory disease or asthma.Also has Rheumatoid arthritis on TNF-alpha inhibitors and chronic prednisone.   Interval Updates: Here for follow up for shortness of breath. Still short of breath but the congestion and frequent coughing is improved. No \\exacerbations  in the last 4 months requiring predniosne.  On Breztri for maintenance with prn albuterol nebulizers. No thrush or adverse effects.   Chronic sinusitis. On flonase, astelin, singulair, zyrtec.   At baseline on 5 mg prednisone for his RA. Switched off TNF-alpha with cimzia about 4 months ago. 2 months ago started a new medication but cannot recall the name.     Social history: 100 pack year history  I have reviewed the patient's family social and past medical history and updated as appropriate.   Past Medical History:  Diagnosis Date   BPH (benign prostatic hyperplasia)    Carpal tunnel syndrome on both sides    Complication of anesthesia    Low BP and O2 sats after EGD   COPD (chronic obstructive pulmonary disease) (HCC)    GERD (gastroesophageal reflux disease)    Hyperlipidemia    Hypertension    RAD (reactive airway disease)    no PMH of asthma; RAD post infection   Rheumatoid arthritis (HCC)         Past Surgical History:  Procedure Laterality Date   BIOPSY  01/06/2018   Procedure: BIOPSY;  Surgeon: Hilarie Fredrickson, MD;  Location: [facility];  Service: Endoscopy;;   BIOPSY  01/03/2020   Procedure: BIOPSY;  Surgeon: Hilarie Fredrickson, MD;  Location: [facility];  Service: Endoscopy;;    CATARACT EXTRACTION W/PHACO Left 09/11/2021   Procedure: CATARACT EXTRACTION PHACO AND INTRAOCULAR LENS PLACEMENT (IOC) LEFT 6.38 00:41.6;  Surgeon: Galen Manila, MD;  Location: [facility];  Service: Ophthalmology;  Laterality: Left;   CATARACT EXTRACTION W/PHACO Right 09/25/2021   Procedure: CATARACT EXTRACTION PHACO AND INTRAOCULAR LENS PLACEMENT (IOC) RIGHT 8.01 00:49.9;  Surgeon: Galen Manila, MD;  Location: [facility];  Service: Ophthalmology;  Laterality: Right;   COLONOSCOPY W/ POLYPECTOMY  2009   X 2; Dr.Perry    ERCP N/A 11/05/2016   Procedure: ENDOSCOPIC RETROGRADE CHOLANGIOPANCREATOGRAPHY (ERCP);  Surgeon: Hilarie Fredrickson, MD;  Location: [facility];  Service: Endoscopy;  Laterality: N/A;   ESOPHAGOGASTRODUODENOSCOPY (EGD) WITH PROPOFOL N/A 07/29/2016   Procedure: ESOPHAGOGASTRODUODENOSCOPY (EGD) WITH PROPOFOL;  Surgeon: Hilarie Fredrickson, MD;  Location: [facility];  Service: Endoscopy;  Laterality: N/A;  will need ERCP scope   ESOPHAGOGASTRODUODENOSCOPY (EGD) WITH PROPOFOL N/A 01/06/2018   Procedure: ESOPHAGOGASTRODUODENOSCOPY (EGD) WITH PROPOFOL;  Surgeon: Hilarie Fredrickson, MD;  Location: [facility];  Service: Endoscopy;  Laterality: N/A;  NEED ERCP SCOPE   ESOPHAGOGASTRODUODENOSCOPY (EGD) WITH PROPOFOL N/A 01/03/2020   Procedure: ESOPHAGOGASTRODUODENOSCOPY (EGD) WITH PROPOFOL;  Surgeon: Hilarie Fredrickson, MD;  Location: [facility];  Service: Endoscopy;  Laterality: N/A;  Needs ERCP scope   HERNIA REPAIR     LUMBAR LAMINECTOMY  07/2008   UPPER GASTROINTESTINAL ENDOSCOPY      gastric  polyp;Dr.Perry    VASECTOMY     WHIPPLE PROCEDURE  2012   partial ; [facility]    Family History  Problem Relation Age of Onset   Skin cancer Mother    Throat cancer Father        smoker   Esophageal cancer Father    Uterine cancer Sister    COPD Sister        X2   Ovarian cancer Sister    Throat cancer Sister        smoker   Breast cancer Sister    Lung cancer Sister        smoker    Leukemia Sister    Coronary artery disease Brother        in 62s   Lung cancer Brother        kidney cancer   Throat cancer Brother    Heart disease Brother    Heart disease Brother    Heart disease Maternal Grandfather    Stroke Paternal Grandmother 71   Colon cancer Paternal Grandfather    Heart disease Paternal Grandfather    Prostate cancer Paternal Uncle    Heart attack Paternal Uncle        3 uncles, >55   Asthma Neg Hx     Social History   Occupational History   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 rate 16, height 5\' 7"  (1.702 m), weight 181 lb (82.1 kg), SpO2 97%.  Gen:     NAD Lungs:   breath sounds diminished, no wheezes or crackles, on room air CV:       tachycardic, regular, no murmur   Data Reviewed: Imaging: I have personally reviewed the CT Chest June 2024 - shows mild chronic bronchitis changes predominant bibasilar.  Chest xray obtained feb 2024 - no acute process  Reviewed his CT maxillofacial from 2013 and 2017 with chronic sinusitis.   PFTs: Mild restriction to ventilation Jan 2022  Labs: Lab Results  Component Value Date   WBC 4.8 11/12/2022   HGB 14.1 11/12/2022   HCT 41.8 11/12/2022   MCV 88.6 11/12/2022   PLT 196.0 11/12/2022   Lab Results  Component Value Date   NA 139 07/18/2022   K 3.0 (L) 07/18/2022   CL 102 07/18/2022    CO2 28 07/18/2022     Immunization status: Immunization History  Administered Date(s) Administered   Fluad Quad(high Dose 65+) 05/12/2019, 05/12/2020, 05/22/2022   Influenza Split 06/25/2011, 04/15/2012   Influenza Whole 05/04/2009, 06/06/2010   Influenza, High Dose Seasonal PF 05/03/2013   Influenza, Quadrivalent, Recombinant, Inj, Pf 03/28/2021   Influenza,inj,Quad PF,6+ Mos 04/07/2014, 04/03/2015, 04/19/2016, 05/02/2017   Influenza,inj,Quad PF,6-35 Mos 05/13/2012, 05/31/2013, 04/07/2014   Influenza-Unspecified 06/25/2011, 04/15/2012, 05/13/2012, 04/07/2014, 04/21/2018, 04/11/2021  PFIZER Comirnaty(Gray Top)Covid-19 Tri-Sucrose Vaccine 07/30/2019, 08/20/2019   PFIZER(Purple Top)SARS-COV-2 Vaccination 07/30/2019, 08/20/2019   Pneumococcal Conjugate-13 04/07/2014   Pneumococcal Polysaccharide-23 05/04/2009, 04/07/2014, 05/02/2017   Td 08/08/2010   Td (Adult),5 Lf Tetanus Toxid, Preservative Free 08/08/2010   Unspecified SARS-COV-2 Vaccination 07/30/2019, 08/20/2019   Zoster, Live 08/10/2012    Assessment:  COPD FEV1 85% of predicted with high symptom burden and recurrent exacerbations, improved control after discontinuation of cimzia. Chronic allergic and non allergic sinusitis - controlled History of 100 pack year smoking, quit 1999 Rheumatoid Arthritis on TNF-alpha inhibitor  Plan/Recommendations: continue albuterol continue breztri  continue singulair, flonase, zyrtec for rhinitis, with nasal saline. continue astelin nasal spray for ongoing rhinitis Will stop daliresp as minimal benefit Would not start daily scheduled ppx azithromycin due to already being on doxycycline 50 mg daily for acne.  IgE 110 and eosinophils 100. Not substantially elevated but probably could get xolair qualified or dupixent. However before starting this would want to see if he can switch off cimzia.  I am concerned TNF-alpha inhibitor with increased risk of respiratory infections up to 22% could be  contributing to his symptoms. I have called Dr Clarise Cruz office and left message for a call back.     Return to Care: No follow-ups on file.   Durel Salts, MD Pulmonary and Critical Care Medicine [facility] Office:[phone]

## 2023-05-22 DIAGNOSIS — M0579 Rheumatoid arthritis with rheumatoid factor of multiple sites without organ or systems involvement: Secondary | ICD-10-CM | POA: Diagnosis not present

## 2023-05-27 ENCOUNTER — Encounter: Payer: Self-pay | Admitting: Internal Medicine

## 2023-05-27 ENCOUNTER — Ambulatory Visit: Payer: Medicare PPO | Admitting: Internal Medicine

## 2023-05-27 VITALS — BP 94/56 | HR 88 | Temp 98.1°F | Ht 66.0 in | Wt 177.0 lb

## 2023-05-27 DIAGNOSIS — M06049 Rheumatoid arthritis without rheumatoid factor, unspecified hand: Secondary | ICD-10-CM | POA: Diagnosis not present

## 2023-05-27 DIAGNOSIS — J439 Emphysema, unspecified: Secondary | ICD-10-CM

## 2023-05-27 DIAGNOSIS — N1832 Chronic kidney disease, stage 3b: Secondary | ICD-10-CM

## 2023-05-27 DIAGNOSIS — Z0000 Encounter for general adult medical examination without abnormal findings: Secondary | ICD-10-CM

## 2023-05-27 DIAGNOSIS — J4489 Other specified chronic obstructive pulmonary disease: Secondary | ICD-10-CM | POA: Diagnosis not present

## 2023-05-27 DIAGNOSIS — I10 Essential (primary) hypertension: Secondary | ICD-10-CM | POA: Diagnosis not present

## 2023-05-27 NOTE — Assessment & Plan Note (Signed)
I have personally reviewed the Medicare Annual Wellness questionnaire and have noted 1. The patient's medical and social history 2. Their use of alcohol, tobacco or illicit drugs 3. Their current medications and supplements 4. The patient's functional ability including ADL's, fall risks, home safety risks and hearing or visual             impairment. 5. Diet and physical activities 6. Evidence for depression or mood disorders  The patients weight, height, BMI and visual acuity have been recorded in the chart I have made referrals, counseling and provided education to the patient based review of the above and I have provided the pt with a written personalized care plan for preventive services.  I have provided you with a copy of your personalized plan for preventive services. Please take the time to review along with your updated medication list.  Done with cancer screening Had flu vaccine Needs Td, COVID and RSV vaccines at pharmacy Consider shingrix Needs to work on leg strength --especially if will need THR

## 2023-05-27 NOTE — Progress Notes (Signed)
Hearing Screening - Comments:: Did not pass whisper test Vision Screening - Comments:: September 2024

## 2023-05-27 NOTE — Progress Notes (Signed)
Subjective:    Patient ID: Thomas Hudson, male    DOB: 11-12-1940, 82 y.o.   MRN: [id number]  HPI Here for Medicare wellness visit and follow up of chronic health conditions Reviewed advanced directives Reviewed other doctors---Dr Desai--pulmonary, Dr Darci Current, Dr Semeika--gerontology, Dr Fredderick Severance, Dr Dingledein--ophthal, Dr Nahser-cardiology, Dr Bell--dentist, Dr Syed--rheumatology No surgery or hospitalizations in the past year Tries to exercise--can't do much. Trouble with right hip--may have to consider THR Vision is okay Hearing is not great---not excited about hearing aides No falls No depression or anhedonia No alcohol or tobacco Independent with instrumental ADLs No sig memory issues  Reviewed labs---last GFR up to 55 at rheumatologist Now on IV medication for RA--actemva Has had 3 monthly infusions---has helped hands and legs  Breathing is about the same Did have congestion and cough--after getting caught in the rain Using mucinex--helps some No flare now or sig SOB with this  Same BP medications Usually 120 at home--lower lately No regular orthostatic dizziness---and no syncope No chest pain  Gets palpitations at times--"beating a little fast"----brief and goes away on its own Not much edema--better than in the past  Current Outpatient Medications on File Prior to Visit  Medication Sig Dispense Refill   albuterol (VENTOLIN HFA) 108 (90 Base) MCG/ACT inhaler Inhale 2 puffs into the lungs every 6 (six) hours as needed for wheezing or shortness of breath. 3 each 3   alfuzosin (UROXATRAL) 10 MG 24 hr tablet TAKE 1 TABLET(10 MG) BY MOUTH DAILY 90 tablet 3   azelastine (ASTELIN) 0.1 % nasal spray Place 1 spray into both nostrils 2 (two) times daily. Use in each nostril as directed 90 mL 3   betamethasone, augmented, (DIPROLENE) 0.05 % lotion Apply 1 application. topically daily.     BREZTRI AEROSPHERE 160-9-4.8 MCG/ACT AERO Inhale 2 puffs into the lungs 2  (two) times daily. 30.6 g 3   cetirizine (ZYRTEC) 10 MG tablet Take 1 tablet (10 mg total) by mouth at bedtime. 90 tablet 3   chlorthalidone (HYGROTON) 25 MG tablet Take 25 mg by mouth daily.     doxycycline (MONODOX) 50 MG capsule Take 50 mg by mouth daily.     erythromycin ophthalmic ointment Place 1 application into both eyes daily as needed (dry eyes).     ferrous sulfate 325 (65 FE) MG tablet Take 1 tablet (325 mg total) by mouth 3 (three) times daily with meals. (Patient taking differently: Take 325 mg by mouth every other day.) 270 tablet 3   finasteride (PROSCAR) 5 MG tablet Take 1 tablet (5 mg total) by mouth daily. 90 tablet 3   fluticasone (FLONASE) 50 MCG/ACT nasal spray Place 2 sprays into both nostrils daily as needed for allergies. 48 g 3   folic acid (FOLVITE) 1 MG tablet Take 1 tablet (1 mg total) by mouth daily. 90 tablet 3   losartan (COZAAR) 100 MG tablet Take 1 tablet (100 mg total) by mouth daily. 90 tablet 3   montelukast (SINGULAIR) 10 MG tablet Take 1 tablet (10 mg total) by mouth at bedtime. 90 tablet 3   Multiple Vitamin (MULTIVITAMIN WITH MINERALS) TABS tablet Take 1 tablet by mouth daily.     omeprazole (PRILOSEC) 20 MG capsule Take 1 capsule (20 mg total) by mouth daily. 90 capsule 3   potassium chloride SA (KLOR-CON M) 20 MEQ tablet Take 20 mEq by mouth daily.     predniSONE (DELTASONE) 5 MG tablet Take 1 tablet (5 mg total) by mouth See admin  instructions. Take 1 tablet daily 90 tablet 3   roflumilast (DALIRESP) 500 MCG TABS tablet Take 1 tablet (500 mcg total) by mouth daily. 90 tablet 3   rosuvastatin (CRESTOR) 10 MG tablet Take 1 tablet (10 mg total) by mouth daily. 90 tablet 1   Tocilizumab (ACTEMRA IV) Inject 4 mg/kg into the vein every 30 (thirty) days.     albuterol (PROVENTIL) (2.5 MG/3ML) 0.083% nebulizer solution Take 3 mLs (2.5 mg total) by nebulization every 6 (six) hours as needed for wheezing or shortness of breath. 360 mL 3   No current  facility-administered medications on file prior to visit.    Allergies  Allergen Reactions   Morphine And Codeine Other (See Comments)    Itching & rash Because of a history of documented adverse serious drug reaction;Medi Alert bracelet  is recommended    Past Medical History:  Diagnosis Date   BPH (benign prostatic hyperplasia)    Carpal tunnel syndrome on both sides    Complication of anesthesia    Low BP and O2 sats after EGD   COPD (chronic obstructive pulmonary disease) (HCC)    GERD (gastroesophageal reflux disease)    Hyperlipidemia    Hypertension    RAD (reactive airway disease)    no PMH of asthma; RAD post infection   Rheumatoid arthritis (HCC)         Past Surgical History:  Procedure Laterality Date   BIOPSY  01/06/2018   Procedure: BIOPSY;  Surgeon: Hilarie Fredrickson, MD;  Location: [facility];  Service: Endoscopy;;   BIOPSY  01/03/2020   Procedure: BIOPSY;  Surgeon: Hilarie Fredrickson, MD;  [facility]: [facility];  Service: Endoscopy;;   CATARACT EXTRACTION W/PHACO Left 09/11/2021   Procedure: CATARACT EXTRACTION PHACO AND INTRAOCULAR LENS PLACEMENT (IOC) LEFT 6.38 00:41.6;  Surgeon: Galen Manila, MD;  Location: [facility];  Service: Ophthalmology;  Laterality: Left;   CATARACT EXTRACTION W/PHACO Right 09/25/2021   Procedure: CATARACT EXTRACTION PHACO AND INTRAOCULAR LENS PLACEMENT (IOC) RIGHT 8.01 00:49.9;  Surgeon: Galen Manila, MD;  Location: [facility];  Service: Ophthalmology;  Laterality: Right;   COLONOSCOPY W/ POLYPECTOMY  2009   X 2; Dr.Perry    ERCP N/A 11/05/2016   Procedure: ENDOSCOPIC RETROGRADE CHOLANGIOPANCREATOGRAPHY (ERCP);  Surgeon: Hilarie Fredrickson, MD;  Location: [facility];  Service: Endoscopy;  Laterality: N/A;   ESOPHAGOGASTRODUODENOSCOPY (EGD) WITH PROPOFOL N/A 07/29/2016   Procedure: ESOPHAGOGASTRODUODENOSCOPY (EGD) WITH PROPOFOL;  Surgeon: Hilarie Fredrickson, MD;  [facility];  Service: Endoscopy;  Laterality: N/A;   will need ERCP scope   ESOPHAGOGASTRODUODENOSCOPY (EGD) WITH PROPOFOL N/A 01/06/2018   Procedure: ESOPHAGOGASTRODUODENOSCOPY (EGD) WITH PROPOFOL;  Surgeon: Hilarie Fredrickson, MD;  [facility];  Service: Endoscopy;  Laterality: N/A;  NEED ERCP SCOPE   ESOPHAGOGASTRODUODENOSCOPY (EGD) WITH PROPOFOL N/A 01/03/2020   Procedure: ESOPHAGOGASTRODUODENOSCOPY (EGD) WITH PROPOFOL;  Surgeon: Hilarie Fredrickson, MD;  [facility];  Service: Endoscopy;  Laterality: N/A;  Needs ERCP scope   HERNIA REPAIR     LUMBAR LAMINECTOMY  07/2008   UPPER GASTROINTESTINAL ENDOSCOPY      gastric polyp;Dr.Perry    VASECTOMY     WHIPPLE PROCEDURE  2012   partial ; [facility]    Family History  Problem Relation Age of Onset   Skin cancer Mother    Throat cancer Father        smoker   Esophageal cancer Father    Uterine cancer Sister    COPD Sister  X2   Ovarian cancer Sister    Throat cancer Sister        smoker   Breast cancer Sister    Lung cancer Sister        smoker   Leukemia Sister    Coronary artery disease Brother        in 41s   Lung cancer Brother        kidney cancer   Throat cancer Brother    Heart disease Brother    Heart disease Brother    Heart disease Maternal Grandfather    Stroke Paternal Grandmother 64   Colon cancer Paternal Grandfather    Heart disease Paternal Grandfather    Prostate cancer Paternal Uncle    Heart attack Paternal Uncle        3 uncles, >55   Asthma Neg Hx     Social History   Socioeconomic History   Marital status: Married    Spouse name: Not on file   Number of children: 2   Years of education: Not on file   Highest education level: Not on file  Occupational History   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  Tobacco Use   Smoking status: Former    Current packs/day: 0.00    Average packs/day: 2.0 packs/day for 50.0 years (100.0 ttl pk-yrs)    Types: Cigarettes    Start date: 07/09/1947    Quit date: 07/08/1997     Years since quitting: 25.9    Passive exposure: Past   Smokeless tobacco: Never  Vaping Use   Vaping status: Never Used  Substance and Sexual Activity   Alcohol use: No    Alcohol/week: 0.0 standard drinks of alcohol   Drug use: No   Sexual activity: Yes  Other Topics Concern   Not on file  Social History Narrative   Has living will   Requests daughters as health care POA   Would accept resuscitation attempts   Not sure about tube feeds--would leave to daughters   Social Determinants of Health   Financial Resource Strain: Low Risk  (11/29/2022)   Overall Financial Resource Strain (CARDIA)    Difficulty of Paying Living Expenses: Not hard at all  Food Insecurity: No Food Insecurity (11/29/2022)   Hunger Vital Sign    Worried About Running Out of Food in the Last Year: Never true    Ran Out of Food in the Last Year: Never true  Transportation Needs: No Transportation Needs (11/29/2022)   PRAPARE - Administrator, Civil Service (Medical): No    Lack of Transportation (Non-Medical): No  Physical Activity: Not on file  Stress: Not on file  Social Connections: Unknown (02/13/2022)   [facility], [facility]   Social Network    Social Network: Not on file  Intimate Partner Violence: Unknown (02/13/2022)   Received from [facility], [facility]   HITS    Physically Hurt: Not on file    Insult or Talk Down To: Not on file    Threaten Physical Harm: Not on file    Scream or Curse: Not on file   Review of Systems Appetite is fine Weight is down slightly--cut out sweets and soda Sleeps fairly well Wears seat belt Teeth okay---having some trouble with gums though. Keeps up with dentist Heartburn controlled by prilosec. No dysphagia Bowels are loose---almost diarrhea. Goes 3-4 times a day. No blood (just dark from iron). No urgency Voids  okay--reasonable stream. Nocturia x 1 generally    Objective:   Physical Exam Constitutional:      Appearance:  Normal appearance.  HENT:     Mouth/Throat:     Pharynx: No oropharyngeal exudate or posterior oropharyngeal erythema.  Eyes:     Conjunctiva/sclera: Conjunctivae normal.     Pupils: Pupils are equal, round, and reactive to light.  Cardiovascular:     Rate and Rhythm: Normal rate and regular rhythm.     Pulses: Normal pulses.     Heart sounds: No murmur heard.    No gallop.  Pulmonary:     Effort: Pulmonary effort is normal.     Breath sounds: No wheezing or rales.     Comments: Slightly decreased breath sounds Abdominal:     Palpations: Abdomen is soft.     Tenderness: There is no abdominal tenderness.  Musculoskeletal:     Cervical back: Neck supple.     Right lower leg: No edema.     Left lower leg: No edema.     Comments: Hand joints are quiet  Lymphadenopathy:     Cervical: No cervical adenopathy.  Skin:    Findings: No lesion or rash.  Neurological:     General: No focal deficit present.     Mental Status: He is alert and oriented to person, place, and time.     Comments: Word naming--7/1 minute Recall 2/3  Psychiatric:        Mood and Affect: Mood normal.        Behavior: Behavior normal.            Assessment & Plan:

## 2023-05-27 NOTE — Assessment & Plan Note (Signed)
Last GFR now up to 55 Still on the losartan

## 2023-05-27 NOTE — Patient Instructions (Signed)
Please get your tetanus booster, COVID vaccine and one time RSV vaccines at the pharmacy. You can also consider the shingles vaccine at the pharmacy

## 2023-05-27 NOTE — Assessment & Plan Note (Signed)
Controlled with breztri, prednisone 5mg  daily, daliresp, montelukast

## 2023-05-27 NOTE — Assessment & Plan Note (Signed)
BP Readings from Last 3 Encounters:  05/27/23 (!) 94/56  05/09/23 116/74  12/31/22 122/74   Running low but no orthostasis On losartan 100mg 

## 2023-05-27 NOTE — Assessment & Plan Note (Signed)
Doing well with new IV medication Dr Kathi Ludwig manages

## 2023-05-29 DIAGNOSIS — M25551 Pain in right hip: Secondary | ICD-10-CM | POA: Diagnosis not present

## 2023-05-29 DIAGNOSIS — M51369 Other intervertebral disc degeneration, lumbar region without mention of lumbar back pain or lower extremity pain: Secondary | ICD-10-CM | POA: Diagnosis not present

## 2023-05-29 DIAGNOSIS — M0600 Rheumatoid arthritis without rheumatoid factor, unspecified site: Secondary | ICD-10-CM | POA: Diagnosis not present

## 2023-05-29 DIAGNOSIS — M1990 Unspecified osteoarthritis, unspecified site: Secondary | ICD-10-CM | POA: Diagnosis not present

## 2023-05-29 DIAGNOSIS — M5030 Other cervical disc degeneration, unspecified cervical region: Secondary | ICD-10-CM | POA: Diagnosis not present

## 2023-05-29 DIAGNOSIS — Z79899 Other long term (current) drug therapy: Secondary | ICD-10-CM | POA: Diagnosis not present

## 2023-06-19 DIAGNOSIS — M0579 Rheumatoid arthritis with rheumatoid factor of multiple sites without organ or systems involvement: Secondary | ICD-10-CM | POA: Diagnosis not present

## 2023-07-07 ENCOUNTER — Encounter: Payer: Self-pay | Admitting: Internal Medicine

## 2023-07-07 DIAGNOSIS — J441 Chronic obstructive pulmonary disease with (acute) exacerbation: Secondary | ICD-10-CM

## 2023-07-07 DIAGNOSIS — I2510 Atherosclerotic heart disease of native coronary artery without angina pectoris: Secondary | ICD-10-CM

## 2023-07-08 MED ORDER — ROSUVASTATIN CALCIUM 10 MG PO TABS: 10.0000 mg | ORAL_TABLET | Freq: Every day | ORAL | 1 refills | Status: DC

## 2023-07-10 NOTE — Telephone Encounter (Signed)
 Thomas Hudson daughter checking on refill request. Thomas Hudson phone number is [phone].

## 2023-07-11 MED ORDER — ROFLUMILAST 500 MCG PO TABS: 500.0000 ug | ORAL_TABLET | Freq: Every day | ORAL | 3 refills | Status: DC

## 2023-07-24 DIAGNOSIS — M0579 Rheumatoid arthritis with rheumatoid factor of multiple sites without organ or systems involvement: Secondary | ICD-10-CM | POA: Diagnosis not present

## 2023-07-25 LAB — LAB REPORT - SCANNED: EGFR: 47

## 2023-07-29 ENCOUNTER — Encounter: Payer: Self-pay | Admitting: Internal Medicine

## 2023-08-05 ENCOUNTER — Telehealth: Payer: Self-pay | Admitting: *Deleted

## 2023-08-05 NOTE — Telephone Encounter (Signed)
Converted from [place] message:  Dr. Celine Mans,   Dad believes he has a sinus infection. He has had congestion for over a week. He is now saying 1/2 his face hurts. He is not seeming to have problems with his chest. Should we start with you for these symptoms or PC?    Thanks,   [employer]

## 2023-08-05 NOTE — Telephone Encounter (Signed)
I called and spoke with the pt's daughter. Thomas Hudson ok per DPR  She states that the pt had been c/o sinus pressure but this has resolved  He denies any respiratory co's Nothing further needed

## 2023-08-06 DIAGNOSIS — H903 Sensorineural hearing loss, bilateral: Secondary | ICD-10-CM | POA: Insufficient documentation

## 2023-08-18 DIAGNOSIS — E876 Hypokalemia: Secondary | ICD-10-CM | POA: Diagnosis not present

## 2023-08-18 DIAGNOSIS — D508 Other iron deficiency anemias: Secondary | ICD-10-CM | POA: Diagnosis not present

## 2023-08-18 DIAGNOSIS — N1831 Chronic kidney disease, stage 3a: Secondary | ICD-10-CM | POA: Diagnosis not present

## 2023-08-20 DIAGNOSIS — D508 Other iron deficiency anemias: Secondary | ICD-10-CM | POA: Diagnosis not present

## 2023-08-21 DIAGNOSIS — E876 Hypokalemia: Secondary | ICD-10-CM | POA: Diagnosis not present

## 2023-08-21 DIAGNOSIS — Z87891 Personal history of nicotine dependence: Secondary | ICD-10-CM | POA: Diagnosis not present

## 2023-08-21 DIAGNOSIS — I129 Hypertensive chronic kidney disease with stage 1 through stage 4 chronic kidney disease, or unspecified chronic kidney disease: Secondary | ICD-10-CM | POA: Diagnosis not present

## 2023-08-21 DIAGNOSIS — N1831 Chronic kidney disease, stage 3a: Secondary | ICD-10-CM | POA: Diagnosis not present

## 2023-08-22 ENCOUNTER — Encounter: Payer: Self-pay | Admitting: Internal Medicine

## 2023-08-22 DIAGNOSIS — M0579 Rheumatoid arthritis with rheumatoid factor of multiple sites without organ or systems involvement: Secondary | ICD-10-CM | POA: Diagnosis not present

## 2023-08-22 NOTE — Telephone Encounter (Signed)
Called and spoke to pt's daughter, per DPR. I discussed I was not sure how to go about documenting each medication, why he is on it, and why it was chosen for him over other medications. I felt like the medication he is on was chosen for him for a reason. Advised her that I could forward to Dr Alphonsus Sias to get his input.   In regards to the medical records, we are able to get the [place] records on Epic CareEverywhere now. So, there is a good possibility that they should be able to pull over his records at the [place]. If not, he would have to sign a records release form at the office and have medical records send them. I did advise I thought if he requested the records there may be a charge. If the [place] requests them, there is not a charge.

## 2023-09-01 ENCOUNTER — Other Ambulatory Visit: Payer: Self-pay | Admitting: Pulmonary Disease

## 2023-09-01 ENCOUNTER — Encounter: Payer: Self-pay | Admitting: Pulmonary Disease

## 2023-09-01 ENCOUNTER — Ambulatory Visit (INDEPENDENT_AMBULATORY_CARE_PROVIDER_SITE_OTHER): Payer: Medicare PPO | Admitting: Pulmonary Disease

## 2023-09-01 VITALS — BP 108/55 | HR 115 | Ht 67.0 in | Wt 185.2 lb

## 2023-09-01 DIAGNOSIS — J440 Chronic obstructive pulmonary disease with acute lower respiratory infection: Secondary | ICD-10-CM | POA: Diagnosis not present

## 2023-09-01 MED ORDER — AZITHROMYCIN 250 MG PO TABS: ORAL_TABLET | ORAL | 0 refills | Status: DC

## 2023-09-01 MED ORDER — PREDNISONE 20 MG PO TABS: 20.0000 mg | ORAL_TABLET | Freq: Every day | ORAL | 0 refills | Status: DC

## 2023-09-01 MED ORDER — BENZONATATE 200 MG PO CAPS: 200.0000 mg | ORAL_CAPSULE | Freq: Three times a day (TID) | ORAL | 0 refills | Status: DC | PRN

## 2023-09-01 NOTE — Patient Instructions (Signed)
 Will call in a prescription for azithromycin  Call in a prescription for prednisone 20 mg to be used for 5 to 7 days  Keep your appointment with Dr. Celine Mans  Call with significant concerns

## 2023-09-01 NOTE — Progress Notes (Signed)
 Thomas Hudson    [id number]    Feb 03, 1941  Primary Care Physician:Letvak, Berneda Rose, MD  Referring Physician: Karie Schwalbe, MD [address] [place],  [place] [address]  Chief complaint:   In for shortness of breath  HPI:  Patient with COPD, has been doing well Received a Moderna COVID shot and gradually developed sinus congestion, cough, congestion, wheezing  Bringing up yellow sometimes brownish phlegm  Has had no fevers No chest pains or chest discomfort  Prior to discharge he was feeling well, optimizing activity levels  Has actually not had any significant problems recently according to himself and his daughter   Outpatient Encounter Medications as of 09/01/2023  Medication Sig   albuterol (VENTOLIN HFA) 108 (90 Base) MCG/ACT inhaler Inhale 2 puffs into the lungs every 6 (six) hours as needed for wheezing or shortness of breath.   alfuzosin (UROXATRAL) 10 MG 24 hr tablet TAKE 1 TABLET(10 MG) BY MOUTH DAILY   azelastine (ASTELIN) 0.1 % nasal spray Place 1 spray into both nostrils 2 (two) times daily. Use in each nostril as directed   BREZTRI AEROSPHERE 160-9-4.8 MCG/ACT AERO Inhale 2 puffs into the lungs 2 (two) times daily.   cetirizine (ZYRTEC) 10 MG tablet Take 1 tablet (10 mg total) by mouth at bedtime.   chlorthalidone (HYGROTON) 25 MG tablet Take 25 mg by mouth daily.   doxycycline (MONODOX) 50 MG capsule Take 50 mg by mouth daily.   erythromycin ophthalmic ointment Place 1 application into both eyes daily as needed (dry eyes).   ferrous sulfate 325 (65 FE) MG tablet Take 1 tablet (325 mg total) by mouth 3 (three) times daily with meals. (Patient taking differently: Take 325 mg by mouth every other day.)   finasteride (PROSCAR) 5 MG tablet Take 1 tablet (5 mg total) by mouth daily.   fluticasone (FLONASE) 50 MCG/ACT nasal spray Place 2 sprays into both nostrils daily as needed for allergies.   folic acid (FOLVITE) 1 MG tablet Take 1 tablet (1 mg  total) by mouth daily.   losartan (COZAAR) 100 MG tablet Take 1 tablet (100 mg total) by mouth daily.   montelukast (SINGULAIR) 10 MG tablet Take 1 tablet (10 mg total) by mouth at bedtime.   Multiple Vitamin (MULTIVITAMIN WITH MINERALS) TABS tablet Take 1 tablet by mouth daily.   omeprazole (PRILOSEC) 20 MG capsule Take 1 capsule (20 mg total) by mouth daily.   potassium chloride SA (KLOR-CON M) 20 MEQ tablet Take 20 mEq by mouth daily.   predniSONE (DELTASONE) 5 MG tablet Take 1 tablet (5 mg total) by mouth See admin instructions. Take 1 tablet daily   roflumilast (DALIRESP) 500 MCG TABS tablet Take 1 tablet (500 mcg total) by mouth daily.   rosuvastatin (CRESTOR) 10 MG tablet Take 1 tablet (10 mg total) by mouth daily.   Tocilizumab (ACTEMRA IV) Inject 4 mg/kg into the vein every 30 (thirty) days.   tretinoin (RETIN-A) 0.025 % gel Apply topically.   albuterol (PROVENTIL) (2.5 MG/3ML) 0.083% nebulizer solution Take 3 mLs (2.5 mg total) by nebulization every 6 (six) hours as needed for wheezing or shortness of breath.   betamethasone, augmented, (DIPROLENE) 0.05 % lotion Apply 1 application. topically daily. (Patient not taking: Reported on 09/01/2023)   No facility-administered encounter medications on file as of 09/01/2023.    Allergies as of 09/01/2023 - Review Complete 09/01/2023  Allergen Reaction Noted   Morphine and codeine Other (See  Comments) 07/24/2012    Past Medical History:  Diagnosis Date   BPH (benign prostatic hyperplasia)    Carpal tunnel syndrome on both sides    Complication of anesthesia    Low BP and O2 sats after EGD   COPD (chronic obstructive pulmonary disease) (HCC)    GERD (gastroesophageal reflux disease)    Hyperlipidemia    Hypertension    RAD (reactive airway disease)    no PMH of asthma; RAD post infection   Rheumatoid arthritis (HCC)         Past Surgical History:  Procedure Laterality Date   BIOPSY  01/06/2018   Procedure: BIOPSY;  Surgeon:  Hilarie Fredrickson, MD;  [facility];  Service: Endoscopy;;   BIOPSY  01/03/2020   Procedure: BIOPSY;  Surgeon: Hilarie Fredrickson, MD;  [facility];  Service: Endoscopy;;   CATARACT EXTRACTION W/PHACO Left 09/11/2021   Procedure: CATARACT EXTRACTION PHACO AND INTRAOCULAR LENS PLACEMENT (IOC) LEFT 6.38 00:41.6;  Surgeon: Galen Manila, MD;  Location: [facility];  Service: Ophthalmology;  Laterality: Left;   CATARACT EXTRACTION W/PHACO Right 09/25/2021   Procedure: CATARACT EXTRACTION PHACO AND INTRAOCULAR LENS PLACEMENT (IOC) RIGHT 8.01 00:49.9;  Surgeon: Galen Manila, MD;  [facility];  Service: Ophthalmology;  Laterality: Right;   COLONOSCOPY W/ POLYPECTOMY  2009   X 2; Dr.Perry    ERCP N/A 11/05/2016   Procedure: ENDOSCOPIC RETROGRADE CHOLANGIOPANCREATOGRAPHY (ERCP);  Surgeon: Hilarie Fredrickson, MD;  Location: [facility];  Service: Endoscopy;  Laterality: N/A;   ESOPHAGOGASTRODUODENOSCOPY (EGD) WITH PROPOFOL N/A 07/29/2016   Procedure: ESOPHAGOGASTRODUODENOSCOPY (EGD) WITH PROPOFOL;  Surgeon: Hilarie Fredrickson, MD;  [facility];  Service: Endoscopy;  Laterality: N/A;  will need ERCP scope   ESOPHAGOGASTRODUODENOSCOPY (EGD) WITH PROPOFOL N/A 01/06/2018   Procedure: ESOPHAGOGASTRODUODENOSCOPY (EGD) WITH PROPOFOL;  Surgeon: Hilarie Fredrickson, MD;  [facility];  Service: Endoscopy;  Laterality: N/A;  NEED ERCP SCOPE   ESOPHAGOGASTRODUODENOSCOPY (EGD) WITH PROPOFOL N/A 01/03/2020   Procedure: ESOPHAGOGASTRODUODENOSCOPY (EGD) WITH PROPOFOL;  Surgeon: Hilarie Fredrickson, MD;  [facility];  Service: Endoscopy;  Laterality: N/A;  Needs ERCP scope   HERNIA REPAIR     LUMBAR LAMINECTOMY  07/2008   UPPER GASTROINTESTINAL ENDOSCOPY      gastric polyp;Dr.Perry    VASECTOMY     WHIPPLE PROCEDURE  2012   partial ; [facility]    Family History  Problem Relation Age of Onset   Skin cancer Mother    Throat cancer Father        smoker   Esophageal cancer  Father    Uterine cancer Sister    COPD Sister        X2   Ovarian cancer Sister    Throat cancer Sister        smoker   Breast cancer Sister    Lung cancer Sister        smoker   Leukemia Sister    Coronary artery disease Brother        in 27s   Lung cancer Brother        kidney cancer   Throat cancer Brother    Heart disease Brother    Heart disease Brother    Heart disease Maternal Grandfather    Stroke Paternal Grandmother 36   Colon cancer Paternal Grandfather    Heart disease Paternal Grandfather    Prostate cancer Paternal Uncle    Heart attack Paternal Uncle  3 uncles, >55   Asthma Neg Hx     Social History   Socioeconomic History   Marital status: Married    Spouse name: Not on file   Number of children: 2   Years of education: Not on file   Highest education level: Not on file  Occupational History   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  Tobacco Use   Smoking status: Former    Current packs/day: 0.00    Average packs/day: 2.0 packs/day for 50.0 years (100.0 ttl pk-yrs)    Types: Cigarettes    Start date: 07/09/1947    Quit date: 07/08/1997    Years since quitting: 26.1    Passive exposure: Past   Smokeless tobacco: Never  Vaping Use   Vaping status: Never Used  Substance and Sexual Activity   Alcohol use: No    Alcohol/week: 0.0 standard drinks of alcohol   Drug use: No   Sexual activity: Yes  Other Topics Concern   Not on file  Social History Narrative   Has living will   Requests daughters as health care POA   Would accept resuscitation attempts   Not sure about tube feeds--would leave to daughters   Social Drivers of Health   Financial Resource Strain: Low Risk  (11/29/2022)   Overall Financial Resource Strain (CARDIA)    Difficulty of Paying Living Expenses: Not hard at all  Food Insecurity: No Food Insecurity (11/29/2022)   Hunger Vital Sign    Worried About Running Out of Food in the Last Year: Never  true    Ran Out of Food in the Last Year: Never true  Transportation Needs: No Transportation Needs (11/29/2022)   PRAPARE - Administrator, Civil Service (Medical): No    Lack of Transportation (Non-Medical): No  Physical Activity: Not on file  Stress: Not on file  Social Connections: Unknown (02/13/2022)   Received from [facility] [place] [facility], [facility]   Social Network    Social Network: Not on file  Intimate Partner Violence: Unknown (02/13/2022)   Received from [facility], [facility]   HITS    Physically Hurt: Not on file    Insult or Talk Down To: Not on file    Threaten Physical Harm: Not on file    Scream or Curse: Not on file    Review of Systems  Respiratory:  Positive for cough, shortness of breath and wheezing.     Vitals:   09/01/23 1324  BP: (!) 108/55  Pulse: (!) 115  SpO2: 95%     Physical Exam Constitutional:      Appearance: Normal appearance.  HENT:     Head: Normocephalic.     Nose: Nose normal.     Mouth/Throat:     Mouth: Mucous membranes are moist.  Eyes:     General: No scleral icterus.    Pupils: Pupils are equal, round, and reactive to light.  Cardiovascular:     Rate and Rhythm: Normal rate and regular rhythm.     Heart sounds: No murmur heard.    No friction rub.  Pulmonary:     Effort: No respiratory distress.     Breath sounds: No stridor. No wheezing or rhonchi.  Musculoskeletal:     Cervical back: No rigidity or tenderness.  Neurological:     Mental Status: He is alert.  Psychiatric:        Mood and Affect: Mood normal.    Data  Reviewed: Recent records reviewed  Last CT scan of the chest June 2024 reviewed  Assessment:  COPD with exacerbation  History of rheumatoid arthritis  Concern for lower respiratory tract infection  Plan/Recommendations: Call in a course of azithromycin  Increase prednisone to 20 mg daily from 5 for about 5 to 7 days  Continue Breztri  Inhaler use as needed  Keep appointment  with Dr. Celine Mans in a few weeks   Virl Diamond MD [facility] 09/01/2023, 1:33 PM  CC: Karie Schwalbe, MD

## 2023-09-02 ENCOUNTER — Telehealth: Payer: Self-pay | Admitting: Pulmonary Disease

## 2023-09-02 NOTE — Telephone Encounter (Signed)
 Thomas Hudson daughter states RX medication from yesterday needs to go to [facility]. Thomas Hudson phone number is [phone].

## 2023-09-03 ENCOUNTER — Encounter: Payer: Self-pay | Admitting: Pulmonary Disease

## 2023-09-03 MED ORDER — PREDNISONE 20 MG PO TABS: 20.0000 mg | ORAL_TABLET | Freq: Every day | ORAL | 0 refills | Status: AC

## 2023-09-03 MED ORDER — BENZONATATE 200 MG PO CAPS: 200.0000 mg | ORAL_CAPSULE | Freq: Three times a day (TID) | ORAL | 0 refills | Status: DC | PRN

## 2023-09-03 MED ORDER — AZITHROMYCIN 250 MG PO TABS: ORAL_TABLET | ORAL | 0 refills | Status: AC

## 2023-09-03 NOTE — Telephone Encounter (Signed)
 PT's daughter calling again. Her father has also sent a msg thru [facility] as follows:  I called yesterday and left a message regarding the prescriptions from Monday being called into [facility] which is used for long term prescriptions. The short term prescriptions issued Monday should be called into [facility]. My conditioning is worsening and I need to have this addressed as soon as possible.    Regards,   Braven  He is having a COPD Exacerbation and she said he urgent needs this corrected.

## 2023-09-03 NOTE — Telephone Encounter (Signed)
 See MyChart message dated 09/02/23.

## 2023-09-04 DIAGNOSIS — M5030 Other cervical disc degeneration, unspecified cervical region: Secondary | ICD-10-CM | POA: Diagnosis not present

## 2023-09-04 DIAGNOSIS — J988 Other specified respiratory disorders: Secondary | ICD-10-CM | POA: Diagnosis not present

## 2023-09-04 DIAGNOSIS — M0600 Rheumatoid arthritis without rheumatoid factor, unspecified site: Secondary | ICD-10-CM | POA: Diagnosis not present

## 2023-09-04 DIAGNOSIS — M1990 Unspecified osteoarthritis, unspecified site: Secondary | ICD-10-CM | POA: Diagnosis not present

## 2023-09-04 DIAGNOSIS — Z79899 Other long term (current) drug therapy: Secondary | ICD-10-CM | POA: Diagnosis not present

## 2023-09-04 DIAGNOSIS — M51369 Other intervertebral disc degeneration, lumbar region without mention of lumbar back pain or lower extremity pain: Secondary | ICD-10-CM | POA: Diagnosis not present

## 2023-09-08 ENCOUNTER — Ambulatory Visit: Admitting: Internal Medicine

## 2023-09-08 ENCOUNTER — Encounter: Payer: Self-pay | Admitting: Internal Medicine

## 2023-09-08 VITALS — BP 172/68 | HR 106 | Temp 97.9°F | Ht 67.0 in | Wt 181.0 lb

## 2023-09-08 DIAGNOSIS — R058 Other specified cough: Secondary | ICD-10-CM | POA: Diagnosis not present

## 2023-09-08 DIAGNOSIS — N400 Enlarged prostate without lower urinary tract symptoms: Secondary | ICD-10-CM | POA: Insufficient documentation

## 2023-09-08 DIAGNOSIS — N1832 Chronic kidney disease, stage 3b: Secondary | ICD-10-CM | POA: Insufficient documentation

## 2023-09-08 MED ORDER — PREDNISONE 10 MG PO TABS
ORAL_TABLET | ORAL | 0 refills | Status: AC
Start: 2023-09-08 — End: ?

## 2023-09-08 MED ORDER — AMOXICILLIN-POT CLAVULANATE 875-125 MG PO TABS: 1.0000 | ORAL_TABLET | Freq: Two times a day (BID) | ORAL | 0 refills | Status: AC

## 2023-09-08 NOTE — Progress Notes (Unsigned)
 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   History of Present Illness  05/14/2016 1st [facility] office visit/ Thomas Hudson   Chief Complaint  Patient presents with   Advice Only    Referred by Dr. Alphonsus Sias for recurrent bronchitis intermittent Xfew years.   started with head cold in Fall of 2016 then "spread congestion down to chest" with subjective wheeze/cough esp at hs (whenever supine actually regardless of time of day) and daily/noct symptoms since then with sob mostly when coughing only and no resp to ppi = otc with bfast and neg EGD for GERD 04/26/16  Neg resp to singulair/ saba/tessalon  Mucus is variably slightly yellow, can be thick/assoc nasal discharge mostly watery rec Please see patient coordinator before you leave today  to schedule sinus CT Augmentin 875 mg take one pill twice daily  X 10 days - take at breakfast and supper with large glass of water.  It would help reduce the usual side effects (diarrhea and yeast infections) if you ate cultured yogurt at lunch.  Prednisone 10 mg take  4 each am x 2 days,   2 each am x 2 days,  1 each am x 2 days and stop  Prilosec 20 mg x 2  take both  30 min before bfast and add pepcid 20 mg at bedtime GERD diet    CT sinus done 05/20/16  Improved vs previous    05/28/2016  f/u ov/Thomas Hudson re: cough x fall of 2016 / not taking gerd rx as rec  Chief Complaint  Patient presents with   Follow-up    Pt states that he is still coughing - not much improvement. Very little mucus production, wheezing at night and SOB.  Pt states that he suffered with a sinus headache all weekend, little congestion.   Rec Please see patient coordinator before you leave today  to schedule ENT eval  for 3 weeks from now, no sooner  Augmentin 875 mg take one pill twice daily  X 20 days - take at breakfast and supper with large glass of water.  It would help reduce the usual side effects (diarrhea and yeast infections) if you ate cultured yogurt at lunch.  Prednisone 10 mg take  4 each am x 2 days,   2 each am x 2 days,  1 each am x 2 days and stop  Continue prilosec 20 mg x 2  take both  30 min before bfast and add pepcid 20 mg at bedtime    09/08/2023  ACUTE ov/Thomas Hudson re: cough x 2 weeks maint on breztri  / no longer under care of ENT  x for hearing  Chief Complaint  Patient presents with   Cough   Dyspnea: baseline  Cough: yellowish brown with sinus congestion  Sleeping: one pillow / flat bed/ freq wheeze  SABA use: once a day hfa/ neb qod 02: none     No obvious day to day or daytime variability or assoc mucus plugs or hemoptysis or cp or chest tightness or overt  hb symptoms.    Also denies any obvious fluctuation of symptoms with weather or environmental changes or other aggravating or alleviating factors except  as outlined above   No unusual exposure hx or h/o childhood pna/ asthma or knowledge of premature birth.  Current Allergies, Complete Past Medical History, Past Surgical History, Family History, and Social History were reviewed in [employer] record.  ROS  The following are not active complaints unless bolded Hoarseness, sore throat, dysphagia, dental problems, itching, sneezing,  nasal congestion or discharge of excess mucus or purulent secretions, ear ache,   fever, chills, sweats, unintended wt loss or wt gain, classically pleuritic or exertional cp,  orthopnea pnd or arm/hand swelling  or leg swelling, presyncope, palpitations, abdominal pain, anorexia, nausea, vomiting, diarrhea  or change in bowel habits or change in bladder habits, change in stools or change in urine, dysuria, hematuria,  rash, arthralgias, visual complaints, headache, numbness,  weakness or ataxia or problems with walking or coordination,  change in mood or  memory.        Current Meds  Medication Sig   albuterol (VENTOLIN HFA) 108 (90 Base) MCG/ACT inhaler Inhale 2 puffs into the lungs every 6 (six) hours as needed for wheezing or shortness of breath.   alfuzosin (UROXATRAL) 10 MG 24 hr tablet TAKE 1 TABLET(10 MG) BY MOUTH DAILY   azelastine (ASTELIN) 0.1 % nasal spray Place 1 spray into both nostrils 2 (two) times daily. Use in each nostril as directed   azithromycin (ZITHROMAX Z-PAK) 250 MG tablet Take 2 tablets day 1 and then 1 daily for 4 days   benzonatate (TESSALON) 200 MG capsule Take 1 capsule (200 mg total) by mouth 3 (three) times daily as needed for cough.   betamethasone, augmented, (DIPROLENE) 0.05 % lotion Apply 1 application  topically daily.   cetirizine (ZYRTEC) 10 MG tablet Take 1 tablet (10 mg total) by mouth at bedtime.   chlorthalidone (HYGROTON) 25 MG tablet Take 25 mg by mouth daily.   doxycycline (MONODOX) 50 MG capsule Take 50 mg by mouth daily.   erythromycin ophthalmic ointment Place 1 application into both eyes daily as needed (dry eyes).   ferrous sulfate 325 (65 FE) MG tablet Take 1 tablet (325 mg total) by mouth 3 (three) times daily with meals. (Patient taking differently: Take 325 mg by mouth every other day.)   finasteride (PROSCAR) 5 MG tablet Take 1 tablet (5 mg total) by mouth daily.   fluticasone (FLONASE) 50 MCG/ACT nasal spray Place 2 sprays into both nostrils daily as needed for allergies.   folic acid (FOLVITE) 1 MG tablet Take 1 tablet (1 mg total) by mouth daily.   losartan (COZAAR) 100 MG tablet Take 1 tablet (100 mg total) by mouth daily.   montelukast (SINGULAIR) 10 MG tablet Take 1 tablet (10 mg total) by mouth at bedtime.   Multiple Vitamin (MULTIVITAMIN WITH MINERALS) TABS tablet Take 1 tablet by mouth daily.   omeprazole (PRILOSEC) 20 MG capsule Take 1 capsule (20 mg total) by mouth daily.   potassium chloride SA  (KLOR-CON M) 20 MEQ tablet Take 20 mEq by mouth daily.   predniSONE (DELTASONE) 20 MG tablet Take 1 tablet (20 mg total) by mouth daily with breakfast.   predniSONE (DELTASONE) 5 MG tablet Take 1 tablet (5 mg total) by mouth See admin instructions. Take 1 tablet daily   roflumilast (DALIRESP) 500 MCG TABS tablet Take 1 tablet (500 mcg total) by mouth daily.   rosuvastatin (CRESTOR) 10 MG tablet Take 1 tablet (10 mg total) by mouth daily.   Tocilizumab (ACTEMRA IV) Inject 4 mg/kg into the vein every  30 (thirty) days.   tretinoin (RETIN-A) 0.025 % gel Apply topically.               Objective:   Physical Exam  wts  09/08/2023         181 05/28/2016      189  05/14/16 189 lb (85.7 kg)  04/26/16 187 lb (84.8 kg)  04/25/16 187 lb (84.8 kg)    Vital signs reviewed  09/08/2023  - Note at rest 02 sats  07% on RA   General appearance:    hoarse amb wm nad     HEENT : Oropharynx  clear      Nasal turbinates mod edema   NECK :  without  apparent JVD/ palpable Nodes/TM  pseudowheeze better with PLM   LUNGS: no acc muscle use,  Nl contour chest  with minimal insp rhonchi bilaterally without cough on insp or exp maneuvers   CV:  RRR  no s3 or murmur or increase in P2, and no edema   ABD:  soft and nontender   MS:  Gait nl   ext warm without deformities Or obvious joint restrictions  calf tenderness, cyanosis or clubbing    SKIN: warm and dry without lesions    NEURO:  alert, approp, nl sensorium with  no motor or cerebellar deficits apparent.        Assessment & Plan:

## 2023-09-08 NOTE — Patient Instructions (Signed)
 Plan A = Automatic = Always=    Breztri Take 2 puffs first thing in am and then another 2 puffs about 12 hours later.    Work on inhaler technique:  relax and gently blow all the way out then take a nice smooth full deep breath back in, triggering the inhaler at same time you start breathing in.  Hold breath in for at least  5 seconds if you can. Blow out breztri thru nose. Rinse and gargle with water when done.  If mouth or throat bother you at all,  try brushing teeth/gums/tongue with arm and hammer toothpaste/ make a slurry and gargle and spit out.      Plan B = Backup (to supplement plan A, not to replace it) Only use your albuterol inhaler (RED / proair)  as a rescue medication to be used if you can't catch your breath by resting or doing a relaxed purse lip breathing pattern.  - The less you use it, the better it will work when you need it. - Ok to use the inhaler up to 2 puffs  every 4 hours if you must but call for appointment if use goes up over your usual need - Don't leave home without it !!  (think of it like the spare tire for your car)   Plan C = Crisis (instead of Plan B but only if Plan B stops working) - only use your albuterol nebulizer if you first try Plan B and it fails to help > ok to use the nebulizer up to every 4 hours but if start needing it regularly call for immediate appointment   Augmentin 875 mg take one pill twice daily  X 10 days - take at breakfast and supper with large glass of water.  It would help reduce the usual side effects (diarrhea and yeast infections) if you ate cultured yogurt at lunch.   Prednisone 10 mg take  4 each am x 2 days,   2 each am x 2 days,  1 each am x 2 days and stop     Try prilosec otc 20mg  x 2   Take 30-60 min before first meal of the day and Pepcid ac (famotidine) 20 mg one @  bedtime until cough is completely gone for at least a week without the need for cough suppression  For cough/ congestion >  mucinex dm  up to maximum of  1200  mg every 12 hours as needed   If not better I am going to recommend evaluation by [facility]    GERD (REFLUX)  is an extremely common cause of respiratory symptoms just like yours , many times with no obvious heartburn at all.    It can be treated with medication, but also with lifestyle changes including elevation of the head of your bed (ideally with 6 -8inch blocks under the headboard of your bed),  Smoking cessation, avoidance of late meals, excessive alcohol, and avoid fatty foods, chocolate, peppermint, colas, red wine, and acidic juices such as orange juice.  NO MINT OR MENTHOL PRODUCTS SO NO COUGH DROPS - Luden's  USE SUGARLESS CANDY INSTEAD (Jolley ranchers or Stover's or Life Savers) or even ice chips will also do - the key is to swallow to prevent all throat clearing. NO OIL BASED VITAMINS - use powdered substitutes.  Avoid fish oil when coughing.

## 2023-09-09 NOTE — Assessment & Plan Note (Addendum)
 Onset around 1999 when quit smoking > worse since 2016  Allergy profile 05/14/2016 >  Eos 0.3 /  IgE  54 neg RAST  Sinus CT 05/20/2016 > Comparison with prior study reveals interval improvement/ still diffuse thickening - FENO 05/28/2016  =   9  - 05/28/2016 repeat augmentin x 20 days then ent eval at 21 days > seen 06/19/16 by Shoemaker/ no active infection > rec add flonase  - re eval 07/16/16 by shoemaker> rec surgery   His pfts are normal though he could still have component of AB so I didn't change his meds but went over a standard ABC action plan for his low airway symptoms and focused today on UACS  Upper airway cough syndrome (previously labeled PNDS),  is so named because it's frequently impossible to sort out how much is  CR/sinusitis with freq throat clearing (which can be related to primary GERD)   vs  causing  secondary (" extra esophageal")  GERD from wide swings in gastric pressure that occur with throat clearing, often  promoting self use of mint and menthol lozenges that reduce the lower esophageal sphincter tone and exacerbate the problem further in a cyclical fashion.   These are the same pts (now being labeled as having "irritable larynx syndrome" by some cough centers) who not infrequently have a history of having failed to tolerate ace inhibitors,  dry powder inhalers or biphosphonates or report having atypical/extraesophageal reflux symptoms (eg LPR) that don't respond to standard doses of PPI  and are easily confused as having aecopd or asthma flares by even experienced allergists/ pulmonologists (myself included).   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  Total time for H and P, chart review, counseling, reviewing hfa/neb device(s) and generating customized AVS unique to this office visit / same day charting = 31 min for refractory / recurrent resp symptoms of unknown origin.

## 2023-09-11 DIAGNOSIS — M1611 Unilateral primary osteoarthritis, right hip: Secondary | ICD-10-CM | POA: Diagnosis not present

## 2023-09-24 DIAGNOSIS — Z961 Presence of intraocular lens: Secondary | ICD-10-CM | POA: Diagnosis not present

## 2023-09-24 DIAGNOSIS — H26492 Other secondary cataract, left eye: Secondary | ICD-10-CM | POA: Diagnosis not present

## 2023-09-25 DIAGNOSIS — M0579 Rheumatoid arthritis with rheumatoid factor of multiple sites without organ or systems involvement: Secondary | ICD-10-CM | POA: Diagnosis not present

## 2023-10-13 ENCOUNTER — Encounter: Payer: Self-pay | Admitting: Internal Medicine

## 2023-10-13 ENCOUNTER — Other Ambulatory Visit: Payer: Self-pay | Admitting: Internal Medicine

## 2023-10-13 DIAGNOSIS — I2510 Atherosclerotic heart disease of native coronary artery without angina pectoris: Secondary | ICD-10-CM

## 2023-10-13 DIAGNOSIS — J302 Other seasonal allergic rhinitis: Secondary | ICD-10-CM

## 2023-10-13 DIAGNOSIS — K219 Gastro-esophageal reflux disease without esophagitis: Secondary | ICD-10-CM

## 2023-10-13 DIAGNOSIS — I10 Essential (primary) hypertension: Secondary | ICD-10-CM

## 2023-10-13 NOTE — Telephone Encounter (Unsigned)
 Copied from CRM [id number]. Topic: Clinical - Medication Refill >> Oct 13, 2023 12:29 PM Elizebeth Brooking wrote: Most Recent Primary Care Visit:  Provider: Tillman Abide I  Department: [facility]  Visit Type: PHYSICAL  Date: 05/27/2023  Medication: alfuzosin (UROXATRAL) 10 MG 24 hr tablet cetirizine (ZYRTEC) 10 MG tablet ferrous sulfate 325 (65 FE) MG tablet  finasteride (PROSCAR) 5 MG tablet fluticasone (FLONASE) 50 MCG/ACT nasal spray folic acid (FOLVITE) 1 MG tablet  losartan (COZAAR) 100 MG tablet  montelukast (SINGULAIR) 10 MG tablet omeprazole (PRILOSEC) 20 MG capsule predniSONE (DELTASONE) 5 MG tablet rosuvastatin (CRESTOR) 10 MG tablet  Has the patient contacted their pharmacy? Yes (Agent: If no, request that the patient contact the pharmacy for the refill. If patient does not wish to contact the pharmacy document the reason why and proceed with request.) (Agent: If yes, when and what did the pharmacy advise?)  Is this the correct pharmacy for this prescription? Yes If no, delete pharmacy and type the correct one.  This is the patient's preferred pharmacy:  [facility] - [place], [place] - [address] [place] [address] Phone: [phone] Fax: [phone]   Has the prescription been filled recently? No  Is the patient out of the medication? Yes  Has the patient been seen for an appointment in the last year OR does the patient have an upcoming appointment? Yes  Can we respond through MyChart? Yes  Agent: Please be advised that Rx refills may take up to 3 business days. We ask that you follow-up with your pharmacy.

## 2023-10-14 ENCOUNTER — Other Ambulatory Visit: Payer: Self-pay

## 2023-10-14 DIAGNOSIS — Z87891 Personal history of nicotine dependence: Secondary | ICD-10-CM

## 2023-10-14 DIAGNOSIS — R0609 Other forms of dyspnea: Secondary | ICD-10-CM

## 2023-10-14 MED ORDER — OMEPRAZOLE 20 MG PO CPDR: 20.0000 mg | DELAYED_RELEASE_CAPSULE | Freq: Every day | ORAL | 3 refills | Status: AC

## 2023-10-14 MED ORDER — ALBUTEROL SULFATE HFA 108 (90 BASE) MCG/ACT IN AERS: 2.0000 | INHALATION_SPRAY | Freq: Four times a day (QID) | RESPIRATORY_TRACT | 10 refills | Status: DC | PRN

## 2023-10-14 MED ORDER — PREDNISONE 5 MG PO TABS: 5.0000 mg | ORAL_TABLET | ORAL | 3 refills | Status: AC

## 2023-10-14 MED ORDER — CETIRIZINE HCL 10 MG PO TABS: 10.0000 mg | ORAL_TABLET | Freq: Every day | ORAL | 3 refills | Status: AC

## 2023-10-14 MED ORDER — ROSUVASTATIN CALCIUM 10 MG PO TABS: 10.0000 mg | ORAL_TABLET | Freq: Every day | ORAL | 1 refills | Status: AC

## 2023-10-14 MED ORDER — FERROUS SULFATE 325 (65 FE) MG PO TABS: 325.0000 mg | ORAL_TABLET | ORAL | 3 refills | Status: AC

## 2023-10-14 MED ORDER — FLUTICASONE PROPIONATE 50 MCG/ACT NA SUSP: 2.0000 | Freq: Every day | NASAL | 3 refills | Status: AC | PRN

## 2023-10-14 MED ORDER — AZELASTINE HCL 0.1 % NA SOLN: 1.0000 | Freq: Two times a day (BID) | NASAL | 10 refills | Status: AC

## 2023-10-14 MED ORDER — BREZTRI AEROSPHERE 160-9-4.8 MCG/ACT IN AERO: 2.0000 | INHALATION_SPRAY | Freq: Two times a day (BID) | RESPIRATORY_TRACT | 10 refills | Status: AC

## 2023-10-14 MED ORDER — FOLIC ACID 1 MG PO TABS: 1.0000 mg | ORAL_TABLET | Freq: Every day | ORAL | 3 refills | Status: AC

## 2023-10-14 MED ORDER — ALFUZOSIN HCL ER 10 MG PO TB24: ORAL_TABLET | ORAL | 3 refills | Status: AC

## 2023-10-14 MED ORDER — FINASTERIDE 5 MG PO TABS: 5.0000 mg | ORAL_TABLET | Freq: Every day | ORAL | 3 refills | Status: AC

## 2023-10-14 MED ORDER — LOSARTAN POTASSIUM 100 MG PO TABS: 100.0000 mg | ORAL_TABLET | Freq: Every day | ORAL | 3 refills | Status: AC

## 2023-10-14 MED ORDER — MONTELUKAST SODIUM 10 MG PO TABS: 10.0000 mg | ORAL_TABLET | Freq: Every day | ORAL | 3 refills | Status: AC

## 2023-10-14 NOTE — Telephone Encounter (Signed)
Rxs sent electronically.

## 2023-10-14 NOTE — Progress Notes (Signed)
 Rx refill(s) sent.

## 2023-10-20 DIAGNOSIS — M542 Cervicalgia: Secondary | ICD-10-CM | POA: Diagnosis not present

## 2023-10-20 DIAGNOSIS — K1120 Sialoadenitis, unspecified: Secondary | ICD-10-CM | POA: Diagnosis not present

## 2023-10-22 DIAGNOSIS — J449 Chronic obstructive pulmonary disease, unspecified: Secondary | ICD-10-CM | POA: Diagnosis not present

## 2023-10-22 DIAGNOSIS — M5481 Occipital neuralgia: Secondary | ICD-10-CM | POA: Diagnosis not present

## 2023-10-22 DIAGNOSIS — Z8501 Personal history of malignant neoplasm of esophagus: Secondary | ICD-10-CM | POA: Diagnosis not present

## 2023-10-22 DIAGNOSIS — I129 Hypertensive chronic kidney disease with stage 1 through stage 4 chronic kidney disease, or unspecified chronic kidney disease: Secondary | ICD-10-CM | POA: Diagnosis not present

## 2023-10-22 DIAGNOSIS — N1832 Chronic kidney disease, stage 3b: Secondary | ICD-10-CM | POA: Diagnosis not present

## 2023-10-22 DIAGNOSIS — M160 Bilateral primary osteoarthritis of hip: Secondary | ICD-10-CM | POA: Diagnosis not present

## 2023-10-22 DIAGNOSIS — I872 Venous insufficiency (chronic) (peripheral): Secondary | ICD-10-CM | POA: Diagnosis not present

## 2023-10-22 DIAGNOSIS — N1831 Chronic kidney disease, stage 3a: Secondary | ICD-10-CM | POA: Diagnosis not present

## 2023-10-22 DIAGNOSIS — M0600 Rheumatoid arthritis without rheumatoid factor, unspecified site: Secondary | ICD-10-CM | POA: Diagnosis not present

## 2023-10-22 DIAGNOSIS — D519 Vitamin B12 deficiency anemia, unspecified: Secondary | ICD-10-CM | POA: Diagnosis not present

## 2023-10-22 DIAGNOSIS — D508 Other iron deficiency anemias: Secondary | ICD-10-CM | POA: Diagnosis not present

## 2023-10-28 ENCOUNTER — Emergency Department (HOSPITAL_BASED_OUTPATIENT_CLINIC_OR_DEPARTMENT_OTHER)
Admission: EM | Admit: 2023-10-28 | Discharge: 2023-10-29 | Disposition: A | Discharge status: Home / Self Care | Attending: Emergency Medicine | Admitting: Emergency Medicine

## 2023-10-28 ENCOUNTER — Other Ambulatory Visit: Payer: Self-pay | Admitting: Pulmonary Disease

## 2023-10-28 ENCOUNTER — Emergency Department (HOSPITAL_BASED_OUTPATIENT_CLINIC_OR_DEPARTMENT_OTHER)

## 2023-10-28 ENCOUNTER — Encounter (HOSPITAL_BASED_OUTPATIENT_CLINIC_OR_DEPARTMENT_OTHER): Payer: Self-pay | Admitting: Emergency Medicine

## 2023-10-28 DIAGNOSIS — M545 Low back pain, unspecified: Secondary | ICD-10-CM | POA: Diagnosis not present

## 2023-10-28 DIAGNOSIS — Z79899 Other long term (current) drug therapy: Secondary | ICD-10-CM | POA: Diagnosis not present

## 2023-10-28 DIAGNOSIS — M25551 Pain in right hip: Secondary | ICD-10-CM | POA: Diagnosis not present

## 2023-10-28 DIAGNOSIS — M5442 Lumbago with sciatica, left side: Secondary | ICD-10-CM | POA: Diagnosis not present

## 2023-10-28 DIAGNOSIS — N401 Enlarged prostate with lower urinary tract symptoms: Secondary | ICD-10-CM | POA: Diagnosis not present

## 2023-10-28 DIAGNOSIS — M25552 Pain in left hip: Secondary | ICD-10-CM | POA: Insufficient documentation

## 2023-10-28 DIAGNOSIS — K5730 Diverticulosis of large intestine without perforation or abscess without bleeding: Secondary | ICD-10-CM | POA: Diagnosis not present

## 2023-10-28 DIAGNOSIS — G8929 Other chronic pain: Secondary | ICD-10-CM | POA: Diagnosis not present

## 2023-10-28 DIAGNOSIS — R109 Unspecified abdominal pain: Secondary | ICD-10-CM | POA: Diagnosis not present

## 2023-10-28 LAB — URINALYSIS, W/ REFLEX TO CULTURE (INFECTION SUSPECTED)
Bacteria, UA: NONE SEEN
Bilirubin Urine: NEGATIVE
Glucose, UA: >1000 mg/dL — AB
Hgb urine dipstick: NEGATIVE
Ketones, ur: NEGATIVE mg/dL
Leukocytes,Ua: NEGATIVE
Nitrite: NEGATIVE
Specific Gravity, Urine: 1.024 (ref 1.005–1.030)
pH: 5 (ref 5.0–8.0)

## 2023-10-28 LAB — CBC WITH DIFFERENTIAL/PLATELET
Abs Immature Granulocytes: 0.16 10*3/uL — ABNORMAL HIGH (ref 0.00–0.07)
Basophils Absolute: 0 10*3/uL (ref 0.0–0.1)
Basophils Relative: 0 %
Eosinophils Absolute: 0 10*3/uL (ref 0.0–0.5)
Eosinophils Relative: 0 %
HCT: 46.9 % (ref 39.0–52.0)
Hemoglobin: 16.1 g/dL (ref 13.0–17.0)
Immature Granulocytes: 1 %
Lymphocytes Relative: 6 %
Lymphs Abs: 0.8 10*3/uL (ref 0.7–4.0)
MCH: 31.7 pg (ref 26.0–34.0)
MCHC: 34.3 g/dL (ref 30.0–36.0)
MCV: 92.3 fL (ref 80.0–100.0)
Monocytes Absolute: 1.6 10*3/uL — ABNORMAL HIGH (ref 0.1–1.0)
Monocytes Relative: 12 %
Neutro Abs: 11.2 10*3/uL — ABNORMAL HIGH (ref 1.7–7.7)
Neutrophils Relative %: 81 %
Platelets: 248 10*3/uL (ref 150–400)
RBC: 5.08 MIL/uL (ref 4.22–5.81)
RDW: 12.5 % (ref 11.5–15.5)
WBC: 13.7 10*3/uL — ABNORMAL HIGH (ref 4.0–10.5)
nRBC: 0 % (ref 0.0–0.2)

## 2023-10-28 LAB — COMPREHENSIVE METABOLIC PANEL WITH GFR
ALT: 22 U/L (ref 0–44)
AST: 18 U/L (ref 15–41)
Albumin: 4.3 g/dL (ref 3.5–5.0)
Alkaline Phosphatase: 47 U/L (ref 38–126)
Anion gap: 12 (ref 5–15)
BUN: 33 mg/dL — ABNORMAL HIGH (ref 8–23)
CO2: 21 mmol/L — ABNORMAL LOW (ref 22–32)
Calcium: 9.6 mg/dL (ref 8.9–10.3)
Chloride: 103 mmol/L (ref 98–111)
Creatinine, Ser: 1.68 mg/dL — ABNORMAL HIGH (ref 0.61–1.24)
GFR, Estimated: 41 mL/min — ABNORMAL LOW (ref >60)
Glucose, Bld: 155 mg/dL — ABNORMAL HIGH (ref 70–99)
Potassium: 3.4 mmol/L — ABNORMAL LOW (ref 3.5–5.1)
Sodium: 136 mmol/L (ref 135–145)
Total Bilirubin: 2.2 mg/dL — ABNORMAL HIGH (ref 0.0–1.2)
Total Protein: 6 g/dL — ABNORMAL LOW (ref 6.5–8.1)

## 2023-10-28 MED ORDER — HYDROMORPHONE HCL 1 MG/ML IJ SOLN
0.5000 mg | Freq: Once | INTRAMUSCULAR | Status: AC
  Administered 2023-10-28: 0.5 mg via INTRAVENOUS
  Filled 2023-10-28: qty 1

## 2023-10-28 NOTE — ED Notes (Signed)
 Patient notified of need for urine sample to complete eval/assessment. Specimen cup provided and instructions for clean catch given. Patient will notify staff when able to provide

## 2023-10-28 NOTE — ED Provider Notes (Signed)
 [place] EMERGENCY DEPARTMENT AT [facility] Provider Note   CSN: [id number] Arrival date & time: 10/28/23  1093     History  Chief Complaint  Patient presents with   Abdominal Pain    Thomas Hudson is a 83 y.o. male.  Patient to ED with complaint of severe pain in the left lower back. It started earlier today with pain in the hip that traveled to the groin. As the day went on the pain extended to the left low back above the bony pelvis. No flank pain. He states that last night he was getting up every hour to urinate. No dysuria, hematuria or history of kidney stones. No fever, abdominal pain, nausea or vomiting. 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   The history is provided by the patient. No language interpreter was used.  Abdominal Pain      Home Medications Prior to Admission medications   Medication Sig Start Date End Date Taking? Authorizing Provider  albuterol  (PROVENTIL ) (2.5 MG/3ML) 0.083% nebulizer solution Take 3 mLs (2.5 mg total) by nebulization every 6 (six) hours as needed for wheezing or shortness of breath. 12/05/22 04/04/23  Aleck Hurdle, MD  albuterol  (VENTOLIN  HFA) [phone] Base) MCG/ACT inhaler Inhale 2 puffs into the lungs every 6 (six) hours as needed for wheezing or shortness of breath. 10/14/23   Aleck Hurdle, MD  alfuzosin  (UROXATRAL ) 10 MG 24 hr tablet TAKE 1 TABLET(10 MG) BY MOUTH DAILY 10/14/23   Letvak, Richard I, MD  amoxicillin -clavulanate (AUGMENTIN ) 875-125 MG tablet Take 1 tablet by mouth 2 (two) times daily. 09/08/23   Diamond Formica, MD  azelastine  (ASTELIN ) 0.1 % nasal spray Place 1 spray into both nostrils 2 (two) times daily. Use in each nostril as directed 10/14/23   Aleck Hurdle, MD  azithromycin  (ZITHROMAX  Z-PAK) 250 MG tablet Take 2 tablets day 1 and then 1 daily for 4 days 09/03/23   Myer Artis A, MD  benzonatate  (TESSALON ) 200 MG capsule TAKE 1 CAPSULE(200 MG) BY MOUTH THREE TIMES DAILY AS NEEDED FOR COUGH  10/28/23   Olalere, Adewale A, MD  betamethasone, augmented, (DIPROLENE) 0.05 % lotion Apply 1 application  topically daily. 09/05/21   [provider]  budeson-glycopyrrolate -formoterol (BREZTRI  AEROSPHERE) 160-9-4.8 MCG/ACT AERO inhaler Inhale 2 puffs into the lungs in the morning and at bedtime. 10/14/23   Aleck Hurdle, MD  cetirizine  (ZYRTEC ) 10 MG tablet Take 1 tablet (10 mg total) by mouth at bedtime. 10/14/23   Helaine Llanos, MD  chlorthalidone (HYGROTON) 25 MG tablet Take 25 mg by mouth daily.    [provider]  doxycycline  (MONODOX ) 50 MG capsule Take 50 mg by mouth daily. 08/14/20   [provider]  erythromycin ophthalmic ointment Place 1 application into both eyes daily as needed (dry eyes).    [provider]  ferrous sulfate  325 (65 FE) MG tablet Take 1 tablet (325 mg total) by mouth every other day. 10/14/23   Helaine Llanos, MD  finasteride  (PROSCAR ) 5 MG tablet Take 1 tablet (5 mg total) by mouth daily. 10/14/23   Helaine Llanos, MD  fluticasone  (FLONASE ) 50 MCG/ACT nasal spray Place 2 sprays into both nostrils daily as needed for allergies. 10/14/23   Helaine Llanos, MD  folic acid  (FOLVITE ) 1 MG tablet Take 1 tablet (1 mg total) by mouth daily. 10/14/23   Helaine Llanos, MD  losartan  (COZAAR ) 100 MG tablet Take 1 tablet (100 mg  total) by mouth daily. 10/14/23   Helaine Llanos, MD  montelukast  (SINGULAIR ) 10 MG tablet Take 1 tablet (10 mg total) by mouth at bedtime. 10/14/23   Helaine Llanos, MD  Multiple Vitamin (MULTIVITAMIN WITH MINERALS) TABS tablet Take 1 tablet by mouth daily.    [provider]  omeprazole  (PRILOSEC) 20 MG capsule Take 1 capsule (20 mg total) by mouth daily. 10/14/23   Helaine Llanos, MD  potassium chloride  SA (KLOR-CON  M) 20 MEQ tablet Take 20 mEq by mouth daily.    [provider]  predniSONE  (DELTASONE ) 10 MG tablet Take  4 each am x 2 days,   2 each am x 2 days,  1 each am x 2 days and stop  09/08/23   Diamond Formica, MD  predniSONE  (DELTASONE ) 20 MG tablet Take 1 tablet (20 mg total) by mouth daily with breakfast. 09/03/23   Myer Artis A, MD  predniSONE  (DELTASONE ) 5 MG tablet Take 1 tablet (5 mg total) by mouth See admin instructions. Take 1 tablet daily 10/14/23   Letvak, Richard I, MD  roflumilast  (DALIRESP ) 500 MCG TABS tablet Take 1 tablet (500 mcg total) by mouth daily. 07/11/23   Desai, Nikita S, MD  rosuvastatin  (CRESTOR ) 10 MG tablet Take 1 tablet (10 mg total) by mouth daily. 10/14/23   Helaine Llanos, MD  Tocilizumab  (ACTEMRA  IV) Inject 4 mg/kg into the vein every 30 (thirty) days.    [provider]  tretinoin (RETIN-A) 0.025 % gel Apply topically. 08/20/23   [provider]      Allergies    Morphine and codeine    Review of Systems   Review of Systems  Gastrointestinal:  Positive for abdominal pain.    Physical Exam Updated Vital Signs BP 125/64   Pulse 84   Temp 97.9 F (36.6 C) (Oral)   Resp 20   SpO2 94%  Physical Exam Vitals and nursing note reviewed.  Constitutional:      Appearance: He is well-developed.  Pulmonary:     Effort: Pulmonary effort is normal.  Abdominal:     Tenderness: There is no abdominal tenderness. There is no left CVA tenderness.  Musculoskeletal:        General: Normal range of motion.     Cervical back: Normal range of motion.  Skin:    General: Skin is warm and dry.  Neurological:     Mental Status: He is alert and oriented to person, place, and time.     ED Results / Procedures / Treatments   Labs (all labs ordered are listed, but only abnormal results are displayed) Labs Reviewed  CBC WITH DIFFERENTIAL/PLATELET - Abnormal; Notable for the following components:      Result Value   WBC 13.7 (*)    Neutro Abs 11.2 (*)    Monocytes Absolute 1.6 (*)    Abs Immature Granulocytes 0.16 (*)    All other components within normal limits  COMPREHENSIVE METABOLIC PANEL WITH GFR - Abnormal; Notable for  the following components:   Potassium 3.4 (*)    CO2 21 (*)    Glucose, Bld 155 (*)    BUN 33 (*)    Creatinine, Ser 1.68 (*)    Total Protein 6.0 (*)    Total Bilirubin 2.2 (*)    GFR, Estimated 41 (*)    All other components within normal limits  URINALYSIS, W/ REFLEX TO CULTURE (INFECTION SUSPECTED) - Abnormal; Notable for the following components:  Glucose, UA >1,000 (*)    Protein, ur TRACE (*)    All other components within normal limits    EKG None  Radiology CT Renal Stone Study Result Date: 10/28/2023 CLINICAL DATA:  Abdominal/flank pain, stone suspected. Pain is on the left. Urinary frequency. EXAM: CT ABDOMEN AND PELVIS WITHOUT CONTRAST TECHNIQUE: Multidetector CT imaging of the abdomen and pelvis was performed following the standard protocol without IV contrast. RADIATION DOSE REDUCTION: This exam was performed according to the departmental dose-optimization program which includes automated exposure control, adjustment of the mA and/or kV according to patient size and/or use of iterative reconstruction technique. COMPARISON:  07/04/2021 FINDINGS: Lower chest: No acute abnormality. Hepatobiliary: Unremarkable liver. Normal gallbladder. No biliary dilation. Pancreas: Unremarkable. Spleen: Unremarkable. Adrenals/Urinary Tract: Normal adrenal glands. No urinary calculi or hydronephrosis. Bladder is unremarkable. Stomach/Bowel: Normal caliber large and small bowel. No bowel wall thickening. Extensive sigmoid diverticulosis without diverticulitis. The appendix is not visualized.Stomach is within normal limits. Vascular/Lymphatic: Aortic atherosclerosis. No enlarged abdominal or pelvic lymph nodes. Reproductive: Enlarged prostate. Other: No free intraperitoneal fluid or air. Musculoskeletal: No acute fracture. IMPRESSION: 1. No acute abnormality in the abdomen or pelvis. 2. No urinary calculi or hydronephrosis. 3. Extensive sigmoid diverticulosis without diverticulitis. 4. Enlarged  prostate. 5.  Aortic Atherosclerosis (ICD10-I70.0). Electronically Signed   By: Rozell Cornet M.D.   On: 10/28/2023 19:42    Procedures Procedures    Medications Ordered in ED Medications  HYDROmorphone  (DILAUDID ) injection 1 mg (has no administration in time range)  HYDROmorphone  (DILAUDID ) injection 0.5 mg (0.5 mg Intravenous Given 10/28/23 2334)  methocarbamol  (ROBAXIN ) injection 500 mg (500 mg Intravenous Given 10/29/23 0052)  HYDROmorphone  (DILAUDID ) injection 0.5 mg (0.5 mg Intravenous Given 10/29/23 0052)    ED Course/ Medical Decision Making/ A&P Clinical Course as of 10/29/23 0124  Tue Oct 28, 2023  2336 Patient with severe pain in the left low back laterally. Has chronic hip pain bilaterally, but never this severe. No fever. Also has urinary frequency. DDx: kidney stones, pyelonephritis, radiculopathy, septic joint, arthritis [SU]  Wed Oct 29, 2023  0044 12:30 - patient given 0.5 mg Dilaudid  with temporary relief. Pain has resumed at 10/10. IV Robaxin  and additional 0.5 mg Dilaudid  provided.  [SU]  0122 Patient no better in additional 0.5 mg Dilaudid  and Robaxin . Patient care signed out to Dr. Adrain Hudson for recheck, and consideration of admission for pain control.  [SU]  0123 Creatinine(!): 1.68 [JL]    Clinical Course User Index [JL] Rosealee Concha, MD [SU] Mandy Second, PA-C                                 Medical Decision Making Amount and/or Complexity of Data Reviewed Labs: ordered. Radiology: ordered.  Risk Prescription drug management.           Final Clinical Impression(s) / ED Diagnoses Final diagnoses:  Pain of left hip    Rx / DC Orders ED Discharge Orders     None         Rama Burkitt 10/29/23 0124    Rosealee Concha, MD 10/29/23 [phone]

## 2023-10-28 NOTE — ED Provider Triage Note (Signed)
 Emergency Medicine Provider Triage Evaluation Note  Thomas Hudson , a 83 y.o. male  was evaluated in triage.  Pt complains of back pain.  Review of Systems  Positive: Left low back pain, urinary frequency Negative: Fever, vomiting, hematuria,   Physical Exam  BP 115/84 (BP Location: Left Arm)   Pulse (!) 33   Temp 97.9 F (36.6 C) (Oral)   Resp 16   SpO2 98%  Gen:   Awake, no distress   Resp:  Normal effort  MSK:   Moves extremities without difficulty  Other:    Medical Decision Making  Medically screening exam initiated at 7:13 PM.  Appropriate orders placed.  Thomas Hudson was informed that the remainder of the evaluation will be completed by another provider, this initial triage assessment does not replace that evaluation, and the importance of remaining in the ED until their evaluation is complete.  History of chronic hip pain bilaterally. This morning left sided pain around to groin, then later onset low back pain which brings him to ED. Reports being up every hour last night to urinate. No history of stones.    Mandy Second, PA-C 10/28/23 1915

## 2023-10-28 NOTE — ED Triage Notes (Signed)
 Left side lower abdo pain Started last night Urinary frequency last night

## 2023-10-29 ENCOUNTER — Emergency Department (HOSPITAL_COMMUNITY)
Admission: EM | Admit: 2023-10-29 | Discharge: 2023-10-29 | Disposition: A | Discharge status: Home / Self Care | Source: Home / Self Care | Attending: Emergency Medicine | Admitting: Emergency Medicine

## 2023-10-29 ENCOUNTER — Other Ambulatory Visit: Payer: Self-pay

## 2023-10-29 DIAGNOSIS — M25552 Pain in left hip: Secondary | ICD-10-CM | POA: Diagnosis not present

## 2023-10-29 DIAGNOSIS — M25551 Pain in right hip: Secondary | ICD-10-CM | POA: Insufficient documentation

## 2023-10-29 DIAGNOSIS — I10 Essential (primary) hypertension: Secondary | ICD-10-CM | POA: Diagnosis not present

## 2023-10-29 DIAGNOSIS — M79652 Pain in left thigh: Secondary | ICD-10-CM | POA: Diagnosis not present

## 2023-10-29 MED ORDER — HYDROMORPHONE HCL 1 MG/ML IJ SOLN
1.0000 mg | Freq: Once | INTRAMUSCULAR | Status: AC
  Administered 2023-10-29: 1 mg via INTRAVENOUS
  Filled 2023-10-29: qty 1

## 2023-10-29 MED ORDER — LACTATED RINGERS IV BOLUS
500.0000 mL | Freq: Once | INTRAVENOUS | Status: DC
Start: 2023-10-29 — End: 2023-10-29

## 2023-10-29 MED ORDER — METHOCARBAMOL 1000 MG/10ML IJ SOLN
500.0000 mg | Freq: Once | INTRAMUSCULAR | Status: AC
  Administered 2023-10-29: 500 mg via INTRAVENOUS
  Filled 2023-10-29: qty 10

## 2023-10-29 MED ORDER — METHYLPREDNISOLONE 4 MG PO TBPK: ORAL_TABLET | ORAL | 0 refills | Status: AC

## 2023-10-29 MED ORDER — DEXAMETHASONE SODIUM PHOSPHATE 10 MG/ML IJ SOLN
10.0000 mg | Freq: Once | INTRAMUSCULAR | Status: AC
  Administered 2023-10-29: 10 mg via INTRAVENOUS
  Filled 2023-10-29: qty 1

## 2023-10-29 MED ORDER — OXYCODONE HCL 5 MG PO TABS: 5.0000 mg | ORAL_TABLET | Freq: Four times a day (QID) | ORAL | 0 refills | Status: AC | PRN

## 2023-10-29 MED ORDER — LIDOCAINE 5 % EX PTCH: 1.0000 | MEDICATED_PATCH | CUTANEOUS | 0 refills | Status: AC

## 2023-10-29 MED ORDER — ONDANSETRON HCL 4 MG/2ML IJ SOLN
4.0000 mg | Freq: Once | INTRAMUSCULAR | Status: AC
  Administered 2023-10-29: 4 mg via INTRAVENOUS
  Filled 2023-10-29: qty 2

## 2023-10-29 MED ORDER — HYDROMORPHONE HCL 1 MG/ML IJ SOLN
0.5000 mg | Freq: Once | INTRAMUSCULAR | Status: AC
  Administered 2023-10-29: 0.5 mg via INTRAVENOUS
  Filled 2023-10-29: qty 1

## 2023-10-29 NOTE — ED Notes (Signed)
 Patient ambulated in hallway using cane. MD at bedside during ambulation.

## 2023-10-29 NOTE — ED Notes (Signed)
 Pt ambulatory for a short distance with walker and stand by assistance

## 2023-10-29 NOTE — Discharge Instructions (Signed)
 Oxycodone  is a narcotic pain medicine and so please use for breakthrough pain.  This medication is sedating so do not mix alcohol drugs or dangerous activities including driving.  Recommend buy lidocaine  patches over-the-counter or prescription if they are cheaper.  I also recommend Tylenol  1000 mg every 6 hours as needed for pain.  Recommend Medrol  Dosepak which has been prescribed.  Take the first dose tomorrow morning.  Follow-up with the orthopedic specialist.

## 2023-10-29 NOTE — ED Provider Notes (Signed)
 Patient here with acute on chronic pain to his hips.  Right worse than the left.  Had labs and imaging yesterday has follow-up with orthopedics in place.  Plan is for pain management.  I have written him for home OT PT.  He has a walker at home.  He has been getting IV narcotics here.  Plan is for likely steroids and narcotics at home for breakthrough pain.  To follow-up with orthopedics.  There is no concern for cauda equina or need for any further imaging at this time.  Denies any new falls.  Will reeval.  Patient did well ambulating with walker.  He is feeling much better.  Will prescribe Medrol  Dosepak, oxycodone  lidocaine  patches recommend Tylenol .  Will follow-up with his orthopedics.  Discharged in good condition.  This chart was dictated using voice recognition software.  Despite best efforts to proofread,  errors can occur which can change the documentation meaning.    Thomas Rue, DO 10/29/23 1913

## 2023-10-29 NOTE — ED Triage Notes (Addendum)
 BIB EMS from home with left hip and groin pain. Denies injury. Pt was seen at [facility] yesterday but could not stand to sit in a chair any longer and left. Pt had CT yesterday but due to pain didn't stay. Pt called PCP today and was told to return to ED. 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  VS 158/76 86 HR 95% RA

## 2023-10-29 NOTE — ED Provider Notes (Signed)
  Physical Exam  BP 125/64   Pulse 84   Temp 97.9 F (36.6 C) (Oral)   Resp 20   SpO2 94%   Physical Exam  Procedures  Procedures  ED Course / MDM   Clinical Course as of 10/29/23 0209  Tue Oct 28, 2023  2336 Patient with severe pain in the left low back laterally. Has chronic hip pain bilaterally, but never this severe. No fever. Also has urinary frequency. DDx: kidney stones, pyelonephritis, radiculopathy, septic joint, arthritis [SU]  Wed Oct 29, 2023  0044 12:30 - patient given 0.5 mg Dilaudid  with temporary relief. Pain has resumed at 10/10. IV Robaxin  and additional 0.5 mg Dilaudid  provided.  [SU]  0122 Patient no better in additional 0.5 mg Dilaudid  and Robaxin . Patient care signed out to Dr. Adrain Alar for recheck, and consideration of admission for pain control.  [SU]  0123 Creatinine(!): 1.68 [JL]    Clinical Course User Index [JL] Rosealee Concha, MD [SU] Mandy Second, PA-C   Medical Decision Making Amount and/or Complexity of Data Reviewed Labs: ordered. Decision-making details documented in ED Course. Radiology: ordered.  Risk Prescription drug management.   Pt presenting with left hip pain that is severe. 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   On repeat assessment, The patient's pain was improved.  CT imaging without acute findings, urinalysis without evidence of UTI.  He was able to ambulate at his baseline with use of his cane.  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  He has a mild leukocytosis but he just finished a course of prednisone .  His creatinine is 1.68 however he has CKD.  He appears well-hydrated.  He has intact passive range of motion of the hip.  Very low concern for septic arthritis at this time.  He has no urinary or fecal incontinence, no saddle anesthesia, no weakness in the bilateral lower extremities.  Low concern for cauda equina syndrome at this time.  Considered admission for observation in  the setting of the patient's pain however with improved pain control and ability to demonstrate ambulation at his baseline, the patient preferred discharge.  I believe that this is safe and reasonable.  I recommended the patient return to the Emergency Department for any severe worsening symptoms.       Rosealee Concha, MD 10/29/23 [phone]

## 2023-10-29 NOTE — ED Provider Notes (Addendum)
 [name] [facility] Provider Note   CSN: [id number] Arrival date & time: 10/29/23  1343     History  Chief Complaint  Patient presents with   Hip Pain   Groin Pain    Thomas Hudson is a 83 y.o. male.  Patient seen at [facility] yesterday.  Received a total of 1.5 mg of hydromorphone  with improvement in his pain.  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  Patient followed by orthopedics at [facility] and has been considered for possible hip replacement surgery.  Patient normally has trouble with the right hip has not had any injections.  But for the past 3 days has had significant increased pain in the left hip and some pain in the right hip.  He was feeling better but they wanted to admit him and do an MRI he did not want to do that and so therefore he went home he was not given any pain medicines to go home understandably and is back in same kind of pain shape that he was in yesterday.  Denies any numbness or tingling that is new to the bilateral feet and there is no weakness.  There is no incontinence.  There is really no back pain he really points to both hips that did move towards the groin area on both sides.  The pain is made worse by moving the hips.  No fall or injury.  Patient also had CT scan abdomen pelvis without contrast yesterday and then had no acute abnormalities or any acute bony abnormalities.       Home Medications Prior to Admission medications   Medication Sig Start Date End Date Taking? Authorizing Provider  albuterol  (PROVENTIL ) (2.5 MG/3ML) 0.083% nebulizer solution Take 3 mLs (2.5 mg total) by nebulization every 6 (six) hours as needed for wheezing or shortness of breath. 12/05/22 04/04/23  Aleck Hurdle, MD  albuterol  (VENTOLIN  HFA) 108 [phone] Base) MCG/ACT inhaler Inhale 2 puffs into the lungs every 6 (six)  hours as needed for wheezing or shortness of breath. 10/14/23   Aleck Hurdle, MD  alfuzosin  (UROXATRAL ) 10 MG 24 hr tablet TAKE 1 TABLET(10 MG) BY MOUTH DAILY 10/14/23   Helaine Llanos, MD  amoxicillin -clavulanate (AUGMENTIN ) 875-125 MG tablet Take 1 tablet by mouth 2 (two) times daily. 09/08/23   Diamond Formica, MD  azelastine  (ASTELIN ) 0.1 % nasal spray Place 1 spray into both nostrils 2 (two) times daily. Use in each nostril as directed 10/14/23   Aleck Hurdle, MD  azithromycin  (ZITHROMAX  Z-PAK) 250 MG tablet Take 2 tablets day 1 and then 1 daily for 4 days 09/03/23   Myer Artis A, MD  benzonatate  (TESSALON ) 200 MG capsule TAKE 1 CAPSULE(200 MG) BY MOUTH THREE TIMES DAILY AS NEEDED FOR COUGH 10/28/23   Olalere, Adewale A, MD  betamethasone, augmented, (DIPROLENE) 0.05 % lotion Apply 1 application  topically daily. 09/05/21   [provider]  budeson-glycopyrrolate -formoterol (BREZTRI  AEROSPHERE) 160-9-4.8 MCG/ACT AERO inhaler Inhale 2 puffs into the lungs in the morning and at bedtime. 10/14/23   Aleck Hurdle, MD  cetirizine  (ZYRTEC ) 10 MG tablet Take 1 tablet (10 mg total) by mouth at bedtime. 10/14/23   Helaine Llanos, MD  chlorthalidone (HYGROTON) 25 MG tablet Take 25 mg by mouth daily.    [provider]  doxycycline  (MONODOX ) 50 MG capsule  Take 50 mg by mouth daily. 08/14/20   [provider]  erythromycin ophthalmic ointment Place 1 application into both eyes daily as needed (dry eyes).    [provider]  ferrous sulfate  325 (65 FE) MG tablet Take 1 tablet (325 mg total) by mouth every other day. 10/14/23   Helaine Llanos, MD  finasteride  (PROSCAR ) 5 MG tablet Take 1 tablet (5 mg total) by mouth daily. 10/14/23   Helaine Llanos, MD  fluticasone  (FLONASE ) 50 MCG/ACT nasal spray Place 2 sprays into both nostrils daily as needed for allergies. 10/14/23   Helaine Llanos, MD  folic acid  (FOLVITE ) 1 MG tablet Take 1 tablet (1 mg total) by mouth daily.  10/14/23   Helaine Llanos, MD  losartan  (COZAAR ) 100 MG tablet Take 1 tablet (100 mg total) by mouth daily. 10/14/23   Helaine Llanos, MD  montelukast  (SINGULAIR ) 10 MG tablet Take 1 tablet (10 mg total) by mouth at bedtime. 10/14/23   Helaine Llanos, MD  Multiple Vitamin (MULTIVITAMIN WITH MINERALS) TABS tablet Take 1 tablet by mouth daily.    [provider]  omeprazole  (PRILOSEC) 20 MG capsule Take 1 capsule (20 mg total) by mouth daily. 10/14/23   Helaine Llanos, MD  potassium chloride  SA (KLOR-CON  M) 20 MEQ tablet Take 20 mEq by mouth daily.    [provider]  predniSONE  (DELTASONE ) 10 MG tablet Take  4 each am x 2 days,   2 each am x 2 days,  1 each am x 2 days and stop 09/08/23   Diamond Formica, MD  predniSONE  (DELTASONE ) 20 MG tablet Take 1 tablet (20 mg total) by mouth daily with breakfast. 09/03/23   Myer Artis A, MD  predniSONE  (DELTASONE ) 5 MG tablet Take 1 tablet (5 mg total) by mouth See admin instructions. Take 1 tablet daily 10/14/23   Letvak, Richard I, MD  roflumilast  (DALIRESP ) 500 MCG TABS tablet Take 1 tablet (500 mcg total) by mouth daily. 07/11/23   Desai, Nikita S, MD  rosuvastatin  (CRESTOR ) 10 MG tablet Take 1 tablet (10 mg total) by mouth daily. 10/14/23   Helaine Llanos, MD  Tocilizumab  (ACTEMRA  IV) Inject 4 mg/kg into the vein every 30 (thirty) days.    [provider]  tretinoin (RETIN-A) 0.025 % gel Apply topically. 08/20/23   [provider]      Allergies    Morphine and codeine    Review of Systems   Review of Systems  Constitutional:  Negative for chills and fever.  HENT:  Negative for ear pain and sore throat.   Eyes:  Negative for pain and visual disturbance.  Respiratory:  Negative for cough and shortness of breath.   Cardiovascular:  Negative for chest pain and palpitations.  Gastrointestinal:  Negative for abdominal pain and vomiting.  Genitourinary:  Negative for dysuria and hematuria.  Musculoskeletal:   Positive for arthralgias. Negative for back pain.  Skin:  Negative for color change and rash.  Neurological:  Negative for seizures and syncope.  All other systems reviewed and are negative.   Physical Exam Updated Vital Signs BP (!) 154/54   Pulse 71   Temp 99.1 F (37.3 C)   Resp 18   SpO2 96%  Physical Exam Vitals and nursing note reviewed.  Constitutional:      General: He is not in acute distress.    Appearance: Normal appearance. He is well-developed.  HENT:     Head: Normocephalic and  atraumatic.  Eyes:     Extraocular Movements: Extraocular movements intact.     Conjunctiva/sclera: Conjunctivae normal.     Pupils: Pupils are equal, round, and reactive to light.  Cardiovascular:     Rate and Rhythm: Normal rate and regular rhythm.     Heart sounds: No murmur heard. Pulmonary:     Effort: Pulmonary effort is normal. No respiratory distress.     Breath sounds: Normal breath sounds.  Abdominal:     Palpations: Abdomen is soft.     Tenderness: There is no abdominal tenderness.  Musculoskeletal:        General: No swelling.     Cervical back: Normal range of motion and neck supple.     Comments: No back tenderness.  Skin:    General: Skin is warm and dry.     Capillary Refill: Capillary refill takes less than 2 seconds.  Neurological:     General: No focal deficit present.     Mental Status: He is alert and oriented to person, place, and time.     Cranial Nerves: No cranial nerve deficit.     Sensory: No sensory deficit.     Motor: No weakness.  Psychiatric:        Mood and Affect: Mood normal.     ED Results / Procedures / Treatments   Labs (all labs ordered are listed, but only abnormal results are displayed) Labs Reviewed - No data to display  EKG None  Radiology CT Renal Stone Study Result Date: 10/28/2023 CLINICAL DATA:  Abdominal/flank pain, stone suspected. Pain is on the left. Urinary frequency. EXAM: CT ABDOMEN AND PELVIS WITHOUT CONTRAST  TECHNIQUE: Multidetector CT imaging of the abdomen and pelvis was performed following the standard protocol without IV contrast. RADIATION DOSE REDUCTION: This exam was performed according to the departmental dose-optimization program which includes automated exposure control, adjustment of the mA and/or kV according to patient size and/or use of iterative reconstruction technique. COMPARISON:  07/04/2021 FINDINGS: Lower chest: No acute abnormality. Hepatobiliary: Unremarkable liver. Normal gallbladder. No biliary dilation. Pancreas: Unremarkable. Spleen: Unremarkable. Adrenals/Urinary Tract: Normal adrenal glands. No urinary calculi or hydronephrosis. Bladder is unremarkable. Stomach/Bowel: Normal caliber large and small bowel. No bowel wall thickening. Extensive sigmoid diverticulosis without diverticulitis. The appendix is not visualized.Stomach is within normal limits. Vascular/Lymphatic: Aortic atherosclerosis. No enlarged abdominal or pelvic lymph nodes. Reproductive: Enlarged prostate. Other: No free intraperitoneal fluid or air. Musculoskeletal: No acute fracture. IMPRESSION: 1. No acute abnormality in the abdomen or pelvis. 2. No urinary calculi or hydronephrosis. 3. Extensive sigmoid diverticulosis without diverticulitis. 4. Enlarged prostate. 5.  Aortic Atherosclerosis (ICD10-I70.0). Electronically Signed   By: Rozell Cornet M.D.   On: 10/28/2023 19:42    Procedures Procedures    Medications Ordered in ED Medications  HYDROmorphone  (DILAUDID ) injection 1 mg (has no administration in time range)  ondansetron  (ZOFRAN ) injection 4 mg (has no administration in time range)    ED Course/ Medical Decision Making/ A&P                                 Medical Decision Making Risk Prescription drug management.   Patient is not interested in getting an MRI but clinically it does sound as if this is most likely bilateral hip pain with left being greater than right.  The CT scan showed that hips  and pelvis were pretty normal yesterday.  Does not really have any neurodeficits.  Certainly has no incontinence no evidence of any cauda equina syndrome.  Patient did improve with the pain medicine they gave him they are IV.  Will repeat that.  See when getting comfortable patient would really like just to get comfortable have some pain medicine to take at home and then get back to his [facility].  Do not feel that we need to repeat x-rays.  Patient feeling a little bit better with the first dose of Dilaudid .  Will give second dose.  Evening ED physician will recheck.  Will try to get pain control.  May consider steroids.  Patient may need a walker.  Patient really wants to go home so he can follow-up with his [facility] doctors at [place].   Final Clinical Impression(s) / ED Diagnoses Final diagnoses:  Left hip pain  Right hip pain    Rx / DC Orders ED Discharge Orders     None         Nicklas Barns, MD 10/29/23 1531    Nicklas Barns, MD 10/29/23 1710

## 2023-10-29 NOTE — Discharge Instructions (Addendum)
 Return to the nearest emergency department if you have any severe worsening symptoms.  Watch for red flag symptoms which would include urinary or fecal incontinence, numbness in an area of your groin where you would sit on a saddle, bilateral lower extremity weakness and/or numbness, new falls or trauma to the low back, fever and worsening back pain or fever and worsening severe hip pain.

## 2023-10-30 ENCOUNTER — Telehealth: Payer: Self-pay

## 2023-10-30 DIAGNOSIS — M25552 Pain in left hip: Secondary | ICD-10-CM | POA: Diagnosis not present

## 2023-10-30 NOTE — Transitions of Care (Post Inpatient/ED Visit) (Signed)
 10/30/2023  Name: Thomas Hudson MRN: [id number] DOB: [birth year]  Today's TOC FU Call Status: Today's TOC FU Call Status:: Successful TOC FU Call Completed TOC FU Call Complete Date: 10/30/23 Patient's Name and Date of Birth confirmed.  Transition Care Management Follow-up Telephone Call Date of Discharge: 10/29/23 Discharge Facility: [facility] ([facility]) Type of Discharge: Emergency Department Reason for ED Visit: Other: (chronic pain) How have you been since you were released from the hospital?: Same Any questions or concerns?: No  Items Reviewed: Did you receive and understand the discharge instructions provided?: Yes Medications obtained,verified, and reconciled?: Yes (Medications Reviewed) Any new allergies since your discharge?: No Dietary orders reviewed?: NA Do you have support at home?: Yes People in Home [RPT]: spouse  Medications Reviewed Today: Medications Reviewed Today     Reviewed by Darrall Ellison, LPN (Licensed Practical Nurse) on 10/30/23 at 1414  Med List Status: <None>   Medication Order Taking? Sig Documenting Provider Last Dose Status Informant  albuterol  (PROVENTIL ) (2.5 MG/3ML) 0.083% nebulizer solution [id number] No Take 3 mLs (2.5 mg total) by nebulization every 6 (six) hours as needed for wheezing or shortness of breath. Aleck Hurdle, MD Taking Expired 04/04/23 2359   albuterol  (VENTOLIN  HFA) 108 (90 Base) MCG/ACT inhaler [id number]  Inhale 2 puffs into the lungs every 6 (six) hours as needed for wheezing or shortness of breath. Desai, Nikita S, MD  Active   alfuzosin  (UROXATRAL ) 10 MG 24 hr tablet [id number]  TAKE 1 TABLET(10 MG) BY MOUTH DAILY Letvak, Richard I, MD  Active   amoxicillin -clavulanate (AUGMENTIN ) 875-125 MG tablet [id number]  Take 1 tablet by mouth 2 (two) times daily. Diamond Formica, MD  Active   azelastine  (ASTELIN ) 0.1 % nasal spray [id number]  Place 1 spray into both nostrils 2 (two) times daily. Use in each nostril as directed  Aleck Hurdle, MD  Active   azithromycin  (ZITHROMAX  Z-PAK) 250 MG tablet [id number] No Take 2 tablets day 1 and then 1 daily for 4 days Margaretann Sharper, MD Taking Active   benzonatate  (TESSALON ) 200 MG capsule [id number]  TAKE 1 CAPSULE(200 MG) BY MOUTH THREE TIMES DAILY AS NEEDED FOR COUGH Olalere, Adewale A, MD  Active   betamethasone, augmented, (DIPROLENE) 0.05 % lotion [id number] No Apply 1 application  topically daily. [provider] Taking Active   budeson-glycopyrrolate -formoterol (BREZTRI  AEROSPHERE) 160-9-4.8 MCG/ACT AERO inhaler [id number]  Inhale 2 puffs into the lungs in the morning and at bedtime. Desai, Nikita S, MD  Active   cetirizine  (ZYRTEC ) 10 MG tablet [id number]  Take 1 tablet (10 mg total) by mouth at bedtime. Helaine Llanos, MD  Active   chlorthalidone (HYGROTON) 25 MG tablet [id number] No Take 25 mg by mouth daily. [provider] Taking Active   doxycycline  (MONODOX ) 50 MG capsule [id number] No Take 50 mg by mouth daily. [provider] Taking Active Self  erythromycin ophthalmic ointment [id number] No Place 1 application into both eyes daily as needed (dry eyes). [provider] Taking Active Self  ferrous sulfate  325 (65 FE) MG tablet [id number]  Take 1 tablet (325 mg total) by mouth every other day. Helaine Llanos, MD  Active   finasteride  (PROSCAR ) 5 MG tablet [id number]  Take 1 tablet (5 mg total) by mouth daily. Letvak, Richard I, MD  Active   fluticasone  (FLONASE ) 50 MCG/ACT nasal spray [id number]  Place 2 sprays into both nostrils daily as needed for allergies. Helaine Llanos, MD  Active   folic acid  (FOLVITE )  1 MG tablet [id number]  Take 1 tablet (1 mg total) by mouth daily. Helaine Llanos, MD  Active   lidocaine  (LIDODERM ) 5 % [id number]  Place 1 patch onto the skin daily. Remove & Discard patch within 12 hours or as directed by MD Lowery Rue, DO  Active   losartan  (COZAAR ) 100 MG tablet [id number]  Take 1 tablet (100 mg  total) by mouth daily. Helaine Llanos, MD  Active   methylPREDNISolone  (MEDROL  DOSEPAK) 4 MG TBPK tablet [id number]  Follow package insert Lowery Rue, DO  Active   montelukast  (SINGULAIR ) 10 MG tablet [id number]  Take 1 tablet (10 mg total) by mouth at bedtime. Letvak, Richard I, MD  Active   Multiple Vitamin (MULTIVITAMIN WITH MINERALS) TABS tablet [id number] No Take 1 tablet by mouth daily. [provider] Taking Active Self  omeprazole  (PRILOSEC) 20 MG capsule [id number]  Take 1 capsule (20 mg total) by mouth daily. Helaine Llanos, MD  Active   oxyCODONE  (ROXICODONE ) 5 MG immediate release tablet [id number]  Take 1 tablet (5 mg total) by mouth every 6 (six) hours as needed for up to 20 doses for severe pain (pain score 7-10) or breakthrough pain. Lowery Rue, DO  Active   potassium chloride  SA (KLOR-CON  M) 20 MEQ tablet [id number] No Take 20 mEq by mouth daily. [provider] Taking Active   predniSONE  (DELTASONE ) 10 MG tablet [id number]  Take  4 each am x 2 days,   2 each am x 2 days,  1 each am x 2 days and stop Wert, Michael B, MD  Active   predniSONE  (DELTASONE ) 20 MG tablet [id number] No Take 1 tablet (20 mg total) by mouth daily with breakfast. Myer Artis A, MD Taking Active   predniSONE  (DELTASONE ) 5 MG tablet [id number]  Take 1 tablet (5 mg total) by mouth See admin instructions. Take 1 tablet daily Letvak, Richard I, MD  Active   roflumilast  (DALIRESP ) 500 MCG TABS tablet [id number] No Take 1 tablet (500 mcg total) by mouth daily. Aleck Hurdle, MD Taking Active   rosuvastatin  (CRESTOR ) 10 MG tablet [id number]  Take 1 tablet (10 mg total) by mouth daily. Helaine Llanos, MD  Active   Tocilizumab  (ACTEMRA  IV) [id number] No Inject 4 mg/kg into the vein every 30 (thirty) days. [provider] Taking Active   tretinoin (RETIN-A) 0.025 % gel [id number] No Apply topically. [provider] Taking Active             Home Care and  Equipment/Supplies: Were Home Health Services Ordered?: NA Any new equipment or medical supplies ordered?: NA  Functional Questionnaire: Do you need assistance with bathing/showering or dressing?: Yes Do you need assistance with meal preparation?: Yes Do you need assistance with eating?: No Do you have difficulty maintaining continence: No Do you need assistance with getting out of bed/getting out of a chair/moving?: Yes Do you have difficulty managing or taking your medications?: Yes  Follow up appointments reviewed: PCP Follow-up appointment confirmed?: No (declined appt) MD Provider Line Number:[phone] Given: No Specialist Hospital Follow-up appointment confirmed?: Yes Date of Specialist follow-up appointment?: 10/30/23 Follow-Up Specialty Provider:: ortho Do you need transportation to your follow-up appointment?: No Do you understand care options if your condition(s) worsen?: Yes-patient verbalized understanding    SIGNATURE Darrall Ellison, LPN [facility] Nurse Health Advisor Direct Dial [phone]

## 2023-10-31 DIAGNOSIS — M47816 Spondylosis without myelopathy or radiculopathy, lumbar region: Secondary | ICD-10-CM | POA: Diagnosis not present

## 2023-10-31 DIAGNOSIS — M7062 Trochanteric bursitis, left hip: Secondary | ICD-10-CM | POA: Diagnosis not present

## 2023-10-31 DIAGNOSIS — M545 Low back pain, unspecified: Secondary | ICD-10-CM | POA: Diagnosis not present

## 2023-10-31 DIAGNOSIS — M5126 Other intervertebral disc displacement, lumbar region: Secondary | ICD-10-CM | POA: Diagnosis not present

## 2023-10-31 DIAGNOSIS — M48061 Spinal stenosis, lumbar region without neurogenic claudication: Secondary | ICD-10-CM | POA: Diagnosis not present

## 2023-10-31 DIAGNOSIS — Z87891 Personal history of nicotine dependence: Secondary | ICD-10-CM | POA: Diagnosis not present

## 2023-10-31 DIAGNOSIS — M25552 Pain in left hip: Secondary | ICD-10-CM | POA: Diagnosis not present

## 2023-11-05 DIAGNOSIS — M5416 Radiculopathy, lumbar region: Secondary | ICD-10-CM | POA: Diagnosis not present

## 2023-11-05 DIAGNOSIS — M25552 Pain in left hip: Secondary | ICD-10-CM | POA: Diagnosis not present

## 2023-11-05 DIAGNOSIS — D519 Vitamin B12 deficiency anemia, unspecified: Secondary | ICD-10-CM | POA: Diagnosis not present

## 2023-11-05 DIAGNOSIS — R29898 Other symptoms and signs involving the musculoskeletal system: Secondary | ICD-10-CM | POA: Diagnosis not present

## 2023-11-05 DIAGNOSIS — N1831 Chronic kidney disease, stage 3a: Secondary | ICD-10-CM | POA: Diagnosis not present

## 2023-11-05 DIAGNOSIS — M0600 Rheumatoid arthritis without rheumatoid factor, unspecified site: Secondary | ICD-10-CM | POA: Diagnosis not present

## 2023-11-05 DIAGNOSIS — D508 Other iron deficiency anemias: Secondary | ICD-10-CM | POA: Diagnosis not present

## 2023-11-05 DIAGNOSIS — M48061 Spinal stenosis, lumbar region without neurogenic claudication: Secondary | ICD-10-CM | POA: Diagnosis not present

## 2023-11-10 DIAGNOSIS — M5416 Radiculopathy, lumbar region: Secondary | ICD-10-CM | POA: Diagnosis not present

## 2023-11-14 DIAGNOSIS — N401 Enlarged prostate with lower urinary tract symptoms: Secondary | ICD-10-CM | POA: Diagnosis not present

## 2023-11-14 DIAGNOSIS — R3912 Poor urinary stream: Secondary | ICD-10-CM | POA: Diagnosis not present

## 2023-11-26 DIAGNOSIS — M5416 Radiculopathy, lumbar region: Secondary | ICD-10-CM | POA: Diagnosis not present

## 2023-11-27 ENCOUNTER — Other Ambulatory Visit: Payer: Self-pay | Admitting: Pulmonary Disease

## 2023-12-03 DIAGNOSIS — M0579 Rheumatoid arthritis with rheumatoid factor of multiple sites without organ or systems involvement: Secondary | ICD-10-CM | POA: Diagnosis not present

## 2023-12-04 LAB — LAB REPORT - SCANNED
EGFR: 60
HM Hepatitis Screen: NEGATIVE

## 2023-12-24 DIAGNOSIS — M059 Rheumatoid arthritis with rheumatoid factor, unspecified: Secondary | ICD-10-CM | POA: Diagnosis not present

## 2023-12-24 DIAGNOSIS — M48061 Spinal stenosis, lumbar region without neurogenic claudication: Secondary | ICD-10-CM | POA: Diagnosis not present

## 2023-12-24 DIAGNOSIS — M5416 Radiculopathy, lumbar region: Secondary | ICD-10-CM | POA: Diagnosis not present

## 2023-12-29 ENCOUNTER — Other Ambulatory Visit: Payer: Self-pay | Admitting: Pulmonary Disease

## 2024-01-01 DIAGNOSIS — M51369 Other intervertebral disc degeneration, lumbar region without mention of lumbar back pain or lower extremity pain: Secondary | ICD-10-CM | POA: Diagnosis not present

## 2024-01-01 DIAGNOSIS — M129 Arthropathy, unspecified: Secondary | ICD-10-CM | POA: Diagnosis not present

## 2024-01-01 DIAGNOSIS — M0600 Rheumatoid arthritis without rheumatoid factor, unspecified site: Secondary | ICD-10-CM | POA: Diagnosis not present

## 2024-01-01 DIAGNOSIS — M4800 Spinal stenosis, site unspecified: Secondary | ICD-10-CM | POA: Diagnosis not present

## 2024-01-01 DIAGNOSIS — M5030 Other cervical disc degeneration, unspecified cervical region: Secondary | ICD-10-CM | POA: Diagnosis not present

## 2024-01-01 DIAGNOSIS — M0579 Rheumatoid arthritis with rheumatoid factor of multiple sites without organ or systems involvement: Secondary | ICD-10-CM | POA: Diagnosis not present

## 2024-01-01 DIAGNOSIS — Z79899 Other long term (current) drug therapy: Secondary | ICD-10-CM | POA: Diagnosis not present

## 2024-01-01 DIAGNOSIS — M1990 Unspecified osteoarthritis, unspecified site: Secondary | ICD-10-CM | POA: Diagnosis not present

## 2024-01-07 ENCOUNTER — Encounter: Payer: Self-pay | Admitting: Internal Medicine

## 2024-01-07 DIAGNOSIS — M48062 Spinal stenosis, lumbar region with neurogenic claudication: Secondary | ICD-10-CM | POA: Diagnosis not present

## 2024-01-07 DIAGNOSIS — Z789 Other specified health status: Secondary | ICD-10-CM | POA: Diagnosis not present

## 2024-01-07 DIAGNOSIS — M5416 Radiculopathy, lumbar region: Secondary | ICD-10-CM | POA: Diagnosis not present

## 2024-01-07 DIAGNOSIS — Z7409 Other reduced mobility: Secondary | ICD-10-CM | POA: Diagnosis not present

## 2024-01-12 DIAGNOSIS — N1831 Chronic kidney disease, stage 3a: Secondary | ICD-10-CM | POA: Diagnosis not present

## 2024-01-12 DIAGNOSIS — D631 Anemia in chronic kidney disease: Secondary | ICD-10-CM | POA: Diagnosis not present

## 2024-01-12 DIAGNOSIS — E785 Hyperlipidemia, unspecified: Secondary | ICD-10-CM | POA: Diagnosis not present

## 2024-01-12 DIAGNOSIS — I872 Venous insufficiency (chronic) (peripheral): Secondary | ICD-10-CM | POA: Diagnosis not present

## 2024-01-12 DIAGNOSIS — I129 Hypertensive chronic kidney disease with stage 1 through stage 4 chronic kidney disease, or unspecified chronic kidney disease: Secondary | ICD-10-CM | POA: Diagnosis not present

## 2024-01-12 DIAGNOSIS — M48062 Spinal stenosis, lumbar region with neurogenic claudication: Secondary | ICD-10-CM | POA: Diagnosis not present

## 2024-01-12 DIAGNOSIS — J449 Chronic obstructive pulmonary disease, unspecified: Secondary | ICD-10-CM | POA: Diagnosis not present

## 2024-01-12 DIAGNOSIS — M5116 Intervertebral disc disorders with radiculopathy, lumbar region: Secondary | ICD-10-CM | POA: Diagnosis not present

## 2024-01-12 DIAGNOSIS — M160 Bilateral primary osteoarthritis of hip: Secondary | ICD-10-CM | POA: Diagnosis not present

## 2024-01-14 DIAGNOSIS — J449 Chronic obstructive pulmonary disease, unspecified: Secondary | ICD-10-CM | POA: Diagnosis not present

## 2024-01-14 DIAGNOSIS — I129 Hypertensive chronic kidney disease with stage 1 through stage 4 chronic kidney disease, or unspecified chronic kidney disease: Secondary | ICD-10-CM | POA: Diagnosis not present

## 2024-01-14 DIAGNOSIS — I872 Venous insufficiency (chronic) (peripheral): Secondary | ICD-10-CM | POA: Diagnosis not present

## 2024-01-14 DIAGNOSIS — M160 Bilateral primary osteoarthritis of hip: Secondary | ICD-10-CM | POA: Diagnosis not present

## 2024-01-14 DIAGNOSIS — E785 Hyperlipidemia, unspecified: Secondary | ICD-10-CM | POA: Diagnosis not present

## 2024-01-14 DIAGNOSIS — M5116 Intervertebral disc disorders with radiculopathy, lumbar region: Secondary | ICD-10-CM | POA: Diagnosis not present

## 2024-01-14 DIAGNOSIS — M48062 Spinal stenosis, lumbar region with neurogenic claudication: Secondary | ICD-10-CM | POA: Diagnosis not present

## 2024-01-14 DIAGNOSIS — N1831 Chronic kidney disease, stage 3a: Secondary | ICD-10-CM | POA: Diagnosis not present

## 2024-01-14 DIAGNOSIS — D631 Anemia in chronic kidney disease: Secondary | ICD-10-CM | POA: Diagnosis not present

## 2024-01-26 ENCOUNTER — Other Ambulatory Visit: Payer: Self-pay | Admitting: Pulmonary Disease

## 2024-02-02 DIAGNOSIS — M48062 Spinal stenosis, lumbar region with neurogenic claudication: Secondary | ICD-10-CM | POA: Diagnosis not present

## 2024-02-02 DIAGNOSIS — I129 Hypertensive chronic kidney disease with stage 1 through stage 4 chronic kidney disease, or unspecified chronic kidney disease: Secondary | ICD-10-CM | POA: Diagnosis not present

## 2024-02-02 DIAGNOSIS — M160 Bilateral primary osteoarthritis of hip: Secondary | ICD-10-CM | POA: Diagnosis not present

## 2024-02-02 DIAGNOSIS — D631 Anemia in chronic kidney disease: Secondary | ICD-10-CM | POA: Diagnosis not present

## 2024-02-02 DIAGNOSIS — N1831 Chronic kidney disease, stage 3a: Secondary | ICD-10-CM | POA: Diagnosis not present

## 2024-02-02 DIAGNOSIS — J449 Chronic obstructive pulmonary disease, unspecified: Secondary | ICD-10-CM | POA: Diagnosis not present

## 2024-02-02 DIAGNOSIS — I872 Venous insufficiency (chronic) (peripheral): Secondary | ICD-10-CM | POA: Diagnosis not present

## 2024-02-02 DIAGNOSIS — M5116 Intervertebral disc disorders with radiculopathy, lumbar region: Secondary | ICD-10-CM | POA: Diagnosis not present

## 2024-02-02 DIAGNOSIS — E785 Hyperlipidemia, unspecified: Secondary | ICD-10-CM | POA: Diagnosis not present

## 2024-02-10 DIAGNOSIS — E785 Hyperlipidemia, unspecified: Secondary | ICD-10-CM | POA: Diagnosis not present

## 2024-02-10 DIAGNOSIS — M160 Bilateral primary osteoarthritis of hip: Secondary | ICD-10-CM | POA: Diagnosis not present

## 2024-02-10 DIAGNOSIS — I872 Venous insufficiency (chronic) (peripheral): Secondary | ICD-10-CM | POA: Diagnosis not present

## 2024-02-10 DIAGNOSIS — M48062 Spinal stenosis, lumbar region with neurogenic claudication: Secondary | ICD-10-CM | POA: Diagnosis not present

## 2024-02-10 DIAGNOSIS — M5116 Intervertebral disc disorders with radiculopathy, lumbar region: Secondary | ICD-10-CM | POA: Diagnosis not present

## 2024-02-10 DIAGNOSIS — D631 Anemia in chronic kidney disease: Secondary | ICD-10-CM | POA: Diagnosis not present

## 2024-02-10 DIAGNOSIS — N1831 Chronic kidney disease, stage 3a: Secondary | ICD-10-CM | POA: Diagnosis not present

## 2024-02-10 DIAGNOSIS — J449 Chronic obstructive pulmonary disease, unspecified: Secondary | ICD-10-CM | POA: Diagnosis not present

## 2024-02-10 DIAGNOSIS — I129 Hypertensive chronic kidney disease with stage 1 through stage 4 chronic kidney disease, or unspecified chronic kidney disease: Secondary | ICD-10-CM | POA: Diagnosis not present

## 2024-02-11 DIAGNOSIS — K5904 Chronic idiopathic constipation: Secondary | ICD-10-CM | POA: Diagnosis not present

## 2024-02-11 DIAGNOSIS — K219 Gastro-esophageal reflux disease without esophagitis: Secondary | ICD-10-CM | POA: Diagnosis not present

## 2024-02-11 DIAGNOSIS — Z860100 Personal history of colon polyps, unspecified: Secondary | ICD-10-CM | POA: Diagnosis not present

## 2024-02-12 DIAGNOSIS — M1990 Unspecified osteoarthritis, unspecified site: Secondary | ICD-10-CM | POA: Diagnosis not present

## 2024-02-12 DIAGNOSIS — Z79899 Other long term (current) drug therapy: Secondary | ICD-10-CM | POA: Diagnosis not present

## 2024-02-12 DIAGNOSIS — M0600 Rheumatoid arthritis without rheumatoid factor, unspecified site: Secondary | ICD-10-CM | POA: Diagnosis not present

## 2024-02-12 DIAGNOSIS — M25551 Pain in right hip: Secondary | ICD-10-CM | POA: Diagnosis not present

## 2024-02-17 DIAGNOSIS — I129 Hypertensive chronic kidney disease with stage 1 through stage 4 chronic kidney disease, or unspecified chronic kidney disease: Secondary | ICD-10-CM | POA: Diagnosis not present

## 2024-02-17 DIAGNOSIS — J449 Chronic obstructive pulmonary disease, unspecified: Secondary | ICD-10-CM | POA: Diagnosis not present

## 2024-02-17 DIAGNOSIS — D631 Anemia in chronic kidney disease: Secondary | ICD-10-CM | POA: Diagnosis not present

## 2024-02-17 DIAGNOSIS — I872 Venous insufficiency (chronic) (peripheral): Secondary | ICD-10-CM | POA: Diagnosis not present

## 2024-02-17 DIAGNOSIS — M5116 Intervertebral disc disorders with radiculopathy, lumbar region: Secondary | ICD-10-CM | POA: Diagnosis not present

## 2024-02-17 DIAGNOSIS — M48062 Spinal stenosis, lumbar region with neurogenic claudication: Secondary | ICD-10-CM | POA: Diagnosis not present

## 2024-02-17 DIAGNOSIS — N1831 Chronic kidney disease, stage 3a: Secondary | ICD-10-CM | POA: Diagnosis not present

## 2024-02-17 DIAGNOSIS — M160 Bilateral primary osteoarthritis of hip: Secondary | ICD-10-CM | POA: Diagnosis not present

## 2024-02-17 DIAGNOSIS — E785 Hyperlipidemia, unspecified: Secondary | ICD-10-CM | POA: Diagnosis not present

## 2024-02-25 DIAGNOSIS — J449 Chronic obstructive pulmonary disease, unspecified: Secondary | ICD-10-CM | POA: Diagnosis not present

## 2024-02-25 DIAGNOSIS — E785 Hyperlipidemia, unspecified: Secondary | ICD-10-CM | POA: Diagnosis not present

## 2024-02-25 DIAGNOSIS — I129 Hypertensive chronic kidney disease with stage 1 through stage 4 chronic kidney disease, or unspecified chronic kidney disease: Secondary | ICD-10-CM | POA: Diagnosis not present

## 2024-02-25 DIAGNOSIS — I872 Venous insufficiency (chronic) (peripheral): Secondary | ICD-10-CM | POA: Diagnosis not present

## 2024-02-25 DIAGNOSIS — M5116 Intervertebral disc disorders with radiculopathy, lumbar region: Secondary | ICD-10-CM | POA: Diagnosis not present

## 2024-02-25 DIAGNOSIS — D631 Anemia in chronic kidney disease: Secondary | ICD-10-CM | POA: Diagnosis not present

## 2024-02-25 DIAGNOSIS — M48062 Spinal stenosis, lumbar region with neurogenic claudication: Secondary | ICD-10-CM | POA: Diagnosis not present

## 2024-02-25 DIAGNOSIS — M160 Bilateral primary osteoarthritis of hip: Secondary | ICD-10-CM | POA: Diagnosis not present

## 2024-02-25 DIAGNOSIS — N1831 Chronic kidney disease, stage 3a: Secondary | ICD-10-CM | POA: Diagnosis not present

## 2024-02-26 DIAGNOSIS — M0579 Rheumatoid arthritis with rheumatoid factor of multiple sites without organ or systems involvement: Secondary | ICD-10-CM | POA: Diagnosis not present

## 2024-02-27 ENCOUNTER — Other Ambulatory Visit: Payer: Self-pay | Admitting: Pulmonary Disease

## 2024-03-09 DIAGNOSIS — D72829 Elevated white blood cell count, unspecified: Secondary | ICD-10-CM | POA: Diagnosis not present

## 2024-03-09 DIAGNOSIS — M160 Bilateral primary osteoarthritis of hip: Secondary | ICD-10-CM | POA: Diagnosis not present

## 2024-03-09 DIAGNOSIS — N1831 Chronic kidney disease, stage 3a: Secondary | ICD-10-CM | POA: Diagnosis not present

## 2024-03-09 DIAGNOSIS — M48062 Spinal stenosis, lumbar region with neurogenic claudication: Secondary | ICD-10-CM | POA: Diagnosis not present

## 2024-03-09 DIAGNOSIS — M25561 Pain in right knee: Secondary | ICD-10-CM | POA: Diagnosis not present

## 2024-03-09 DIAGNOSIS — M25461 Effusion, right knee: Secondary | ICD-10-CM | POA: Diagnosis not present

## 2024-03-09 DIAGNOSIS — I129 Hypertensive chronic kidney disease with stage 1 through stage 4 chronic kidney disease, or unspecified chronic kidney disease: Secondary | ICD-10-CM | POA: Diagnosis not present

## 2024-03-09 DIAGNOSIS — E785 Hyperlipidemia, unspecified: Secondary | ICD-10-CM | POA: Diagnosis not present

## 2024-03-09 DIAGNOSIS — M76891 Other specified enthesopathies of right lower limb, excluding foot: Secondary | ICD-10-CM | POA: Diagnosis not present

## 2024-03-09 DIAGNOSIS — M5116 Intervertebral disc disorders with radiculopathy, lumbar region: Secondary | ICD-10-CM | POA: Diagnosis not present

## 2024-03-09 DIAGNOSIS — D631 Anemia in chronic kidney disease: Secondary | ICD-10-CM | POA: Diagnosis not present

## 2024-03-09 DIAGNOSIS — I872 Venous insufficiency (chronic) (peripheral): Secondary | ICD-10-CM | POA: Diagnosis not present

## 2024-03-09 DIAGNOSIS — J449 Chronic obstructive pulmonary disease, unspecified: Secondary | ICD-10-CM | POA: Diagnosis not present

## 2024-03-09 DIAGNOSIS — M1711 Unilateral primary osteoarthritis, right knee: Secondary | ICD-10-CM | POA: Diagnosis not present

## 2024-03-10 DIAGNOSIS — E876 Hypokalemia: Secondary | ICD-10-CM | POA: Diagnosis not present

## 2024-03-10 DIAGNOSIS — N1831 Chronic kidney disease, stage 3a: Secondary | ICD-10-CM | POA: Diagnosis not present

## 2024-03-17 DIAGNOSIS — M48061 Spinal stenosis, lumbar region without neurogenic claudication: Secondary | ICD-10-CM | POA: Diagnosis not present

## 2024-03-17 DIAGNOSIS — M5416 Radiculopathy, lumbar region: Secondary | ICD-10-CM | POA: Diagnosis not present

## 2024-03-17 DIAGNOSIS — M25551 Pain in right hip: Secondary | ICD-10-CM | POA: Diagnosis not present

## 2024-04-12 ENCOUNTER — Encounter: Payer: Self-pay | Admitting: Internal Medicine

## 2024-04-12 DIAGNOSIS — Z87891 Personal history of nicotine dependence: Secondary | ICD-10-CM

## 2024-04-12 DIAGNOSIS — R0609 Other forms of dyspnea: Secondary | ICD-10-CM

## 2024-04-12 MED ORDER — ALBUTEROL SULFATE HFA 108 (90 BASE) MCG/ACT IN AERS: 2.0000 | INHALATION_SPRAY | Freq: Four times a day (QID) | RESPIRATORY_TRACT | 10 refills | Status: DC | PRN

## 2024-04-12 MED ORDER — ALBUTEROL SULFATE (2.5 MG/3ML) 0.083% IN NEBU: 2.5000 mg | INHALATION_SOLUTION | Freq: Four times a day (QID) | RESPIRATORY_TRACT | 3 refills | Status: AC | PRN

## 2024-05-27 ENCOUNTER — Encounter

## 2024-07-27 ENCOUNTER — Encounter: Payer: Self-pay | Admitting: Internal Medicine

## 2024-07-29 ENCOUNTER — Telehealth: Payer: Self-pay

## 2024-07-29 NOTE — Telephone Encounter (Unsigned)
 Copied from CRM #[id number]. Topic: Clinical - Medication Refill >> Jul 28, 2024  2:08 PM Rozanna MATSU wrote: Medication: roflumilast  (DALIRESP ) 500 MCG TABS tablet  Has the patient contacted their pharmacy? Yes (Agent: If no, request that the patient contact the pharmacy for the refill. If patient does not wish to contact the pharmacy document the reason why and proceed with request.) (Agent: If yes, when and what did the pharmacy advise?)  This is the patient's preferred pharmacy:  [facility] - [place], [place] - [address] [place] [address] Phone: [phone] Fax: [phone]   Is this the correct pharmacy for this prescription? Yes If no, delete pharmacy and type the correct one.   Has the prescription been filled recently? Yes  Is the patient out of the medication? Yes  Has the patient been seen for an appointment in the last year OR does the patient have an upcoming appointment? Yes  Can we respond through MyChart? Yes  Agent: Please be advised that Rx refills may take up to 3 business days. We ask that you follow-up with your pharmacy.   Rx sent to pharmacy - [facility]

## 2024-07-30 ENCOUNTER — Other Ambulatory Visit: Payer: Self-pay

## 2024-07-30 DIAGNOSIS — J441 Chronic obstructive pulmonary disease with (acute) exacerbation: Secondary | ICD-10-CM

## 2024-07-30 MED ORDER — ROFLUMILAST 500 MCG PO TABS: 500.0000 ug | ORAL_TABLET | Freq: Every day | ORAL | 3 refills | Status: AC

## 2024-09-15 ENCOUNTER — Ambulatory Visit: Admitting: Internal Medicine
